# Patient Record
Sex: Female | Born: 1950 | Race: White | Hispanic: No | Marital: Married | Smoking: Never smoker
Health system: Southern US, Community
[De-identification: ages and names within clinical notes are randomized; demographics above are authoritative.]

## PROBLEM LIST (undated history)

## (undated) DIAGNOSIS — Z8719 Personal history of other diseases of the digestive system: Secondary | ICD-10-CM

## (undated) DIAGNOSIS — T7840XA Allergy, unspecified, initial encounter: Secondary | ICD-10-CM

## (undated) DIAGNOSIS — D649 Anemia, unspecified: Secondary | ICD-10-CM

## (undated) DIAGNOSIS — K5792 Diverticulitis of intestine, part unspecified, without perforation or abscess without bleeding: Secondary | ICD-10-CM

## (undated) DIAGNOSIS — L219 Seborrheic dermatitis, unspecified: Secondary | ICD-10-CM

## (undated) DIAGNOSIS — R112 Nausea with vomiting, unspecified: Secondary | ICD-10-CM

## (undated) DIAGNOSIS — H269 Unspecified cataract: Secondary | ICD-10-CM

## (undated) DIAGNOSIS — T8859XA Other complications of anesthesia, initial encounter: Secondary | ICD-10-CM

## (undated) DIAGNOSIS — K589 Irritable bowel syndrome without diarrhea: Secondary | ICD-10-CM

## (undated) DIAGNOSIS — Z9889 Other specified postprocedural states: Secondary | ICD-10-CM

## (undated) DIAGNOSIS — B029 Zoster without complications: Secondary | ICD-10-CM

## (undated) DIAGNOSIS — I10 Essential (primary) hypertension: Secondary | ICD-10-CM

## (undated) DIAGNOSIS — J302 Other seasonal allergic rhinitis: Secondary | ICD-10-CM

## (undated) DIAGNOSIS — T4145XA Adverse effect of unspecified anesthetic, initial encounter: Secondary | ICD-10-CM

## (undated) DIAGNOSIS — F329 Major depressive disorder, single episode, unspecified: Secondary | ICD-10-CM

## (undated) DIAGNOSIS — E559 Vitamin D deficiency, unspecified: Secondary | ICD-10-CM

## (undated) DIAGNOSIS — K635 Polyp of colon: Secondary | ICD-10-CM

## (undated) DIAGNOSIS — J45909 Unspecified asthma, uncomplicated: Secondary | ICD-10-CM

## (undated) DIAGNOSIS — F419 Anxiety disorder, unspecified: Secondary | ICD-10-CM

## (undated) DIAGNOSIS — K219 Gastro-esophageal reflux disease without esophagitis: Secondary | ICD-10-CM

## (undated) DIAGNOSIS — A4902 Methicillin resistant Staphylococcus aureus infection, unspecified site: Secondary | ICD-10-CM

## (undated) DIAGNOSIS — G4700 Insomnia, unspecified: Secondary | ICD-10-CM

## (undated) DIAGNOSIS — M8580 Other specified disorders of bone density and structure, unspecified site: Secondary | ICD-10-CM

## (undated) DIAGNOSIS — K760 Fatty (change of) liver, not elsewhere classified: Secondary | ICD-10-CM

## (undated) DIAGNOSIS — E785 Hyperlipidemia, unspecified: Secondary | ICD-10-CM

## (undated) DIAGNOSIS — R7303 Prediabetes: Secondary | ICD-10-CM

## (undated) DIAGNOSIS — M1990 Unspecified osteoarthritis, unspecified site: Secondary | ICD-10-CM

## (undated) DIAGNOSIS — K5790 Diverticulosis of intestine, part unspecified, without perforation or abscess without bleeding: Secondary | ICD-10-CM

## (undated) DIAGNOSIS — M069 Rheumatoid arthritis, unspecified: Secondary | ICD-10-CM

## (undated) HISTORY — DX: Diverticulitis of intestine, part unspecified, without perforation or abscess without bleeding: K57.92

## (undated) HISTORY — DX: Major depressive disorder, single episode, unspecified: F32.9

## (undated) HISTORY — DX: Insomnia, unspecified: G47.00

## (undated) HISTORY — PX: CATARACT EXTRACTION, BILATERAL: SHX1313

## (undated) HISTORY — DX: Unspecified osteoarthritis, unspecified site: M19.90

## (undated) HISTORY — PX: TOTAL KNEE ARTHROPLASTY: SHX125

## (undated) HISTORY — DX: Other seasonal allergic rhinitis: J30.2

## (undated) HISTORY — DX: Rheumatoid arthritis, unspecified: M06.9

## (undated) HISTORY — DX: Irritable bowel syndrome, unspecified: K58.9

## (undated) HISTORY — DX: Unspecified asthma, uncomplicated: J45.909

## (undated) HISTORY — DX: Anemia, unspecified: D64.9

## (undated) HISTORY — PX: JOINT REPLACEMENT: SHX530

## (undated) HISTORY — PX: EYE SURGERY: SHX253

## (undated) HISTORY — DX: Unspecified cataract: H26.9

## (undated) HISTORY — DX: Diverticulosis of intestine, part unspecified, without perforation or abscess without bleeding: K57.90

## (undated) HISTORY — DX: Allergy, unspecified, initial encounter: T78.40XA

## (undated) HISTORY — DX: Other specified disorders of bone density and structure, unspecified site: M85.80

## (undated) HISTORY — DX: Hyperlipidemia, unspecified: E78.5

## (undated) HISTORY — PX: LUMBAR FUSION: SHX111

## (undated) HISTORY — DX: Polyp of colon: K63.5

## (undated) HISTORY — PX: SPINE SURGERY: SHX786

## (undated) HISTORY — DX: Vitamin D deficiency, unspecified: E55.9

## (undated) HISTORY — DX: Anxiety disorder, unspecified: F41.9

## (undated) HISTORY — DX: Essential (primary) hypertension: I10

## (undated) HISTORY — DX: Other specified postprocedural states: Z98.890

## (undated) HISTORY — PX: TONSILLECTOMY: SUR1361

## (undated) HISTORY — DX: Zoster without complications: B02.9

## (undated) HISTORY — DX: Personal history of other diseases of the digestive system: Z87.19

## (undated) HISTORY — DX: Gastro-esophageal reflux disease without esophagitis: K21.9

## (undated) HISTORY — DX: Seborrheic dermatitis, unspecified: L21.9

## (undated) HISTORY — PX: ADENOIDECTOMY: SUR15

## (undated) HISTORY — DX: Prediabetes: R73.03

## (undated) HISTORY — PX: CARDIAC CATHETERIZATION: SHX172

---

## 1973-11-19 HISTORY — PX: APPENDECTOMY: SHX54

## 1986-11-19 HISTORY — PX: TOTAL ABDOMINAL HYSTERECTOMY: SHX209

## 1988-11-19 HISTORY — PX: OTHER SURGICAL HISTORY: SHX169

## 2003-11-20 HISTORY — PX: ROTATOR CUFF REPAIR: SHX139

## 2006-11-19 HISTORY — PX: WRIST RECONSTRUCTION: SHX2675

## 2011-11-20 HISTORY — PX: MENISCUS REPAIR: SHX5179

## 2014-12-06 DIAGNOSIS — D509 Iron deficiency anemia, unspecified: Secondary | ICD-10-CM | POA: Insufficient documentation

## 2014-12-06 DIAGNOSIS — F419 Anxiety disorder, unspecified: Secondary | ICD-10-CM | POA: Insufficient documentation

## 2014-12-06 DIAGNOSIS — F330 Major depressive disorder, recurrent, mild: Secondary | ICD-10-CM | POA: Insufficient documentation

## 2014-12-06 DIAGNOSIS — E785 Hyperlipidemia, unspecified: Secondary | ICD-10-CM | POA: Insufficient documentation

## 2014-12-06 DIAGNOSIS — J45909 Unspecified asthma, uncomplicated: Secondary | ICD-10-CM | POA: Insufficient documentation

## 2015-09-14 DIAGNOSIS — Z8249 Family history of ischemic heart disease and other diseases of the circulatory system: Secondary | ICD-10-CM | POA: Insufficient documentation

## 2015-11-22 ENCOUNTER — Encounter: Payer: Self-pay | Admitting: Gastroenterology

## 2016-01-16 ENCOUNTER — Ambulatory Visit (INDEPENDENT_AMBULATORY_CARE_PROVIDER_SITE_OTHER): Payer: Managed Care, Other (non HMO) | Admitting: Gastroenterology

## 2016-01-16 ENCOUNTER — Encounter: Payer: Self-pay | Admitting: Gastroenterology

## 2016-01-16 VITALS — BP 158/72 | HR 80 | Ht 63.0 in | Wt 163.4 lb

## 2016-01-16 DIAGNOSIS — K5730 Diverticulosis of large intestine without perforation or abscess without bleeding: Secondary | ICD-10-CM

## 2016-01-16 DIAGNOSIS — K921 Melena: Secondary | ICD-10-CM | POA: Diagnosis not present

## 2016-01-16 MED ORDER — NA SULFATE-K SULFATE-MG SULF 17.5-3.13-1.6 GM/177ML PO SOLN: 1.0000 | Freq: Once | ORAL | Status: DC

## 2016-01-16 NOTE — Progress Notes (Signed)
HPI: This is a    very pleasant 65 year old woman   who was referred to me by Velna Hatchet to evaluate  Hemoccult-positive stools .    Chief complaint is Hemoccult positive stools, history of diverticulitis  Moved from [place], [place]. Prior [place].  Has had at least 3 attacks of diverticulitis.  The pain is usually LLQ, lasting 5-10 days, cipro/flagyl, has had CT proof in past.  Most recent attack 2015 while moving this way.  Put on antibiotics and her symptoms resolved. Usually gets 'low grade fevers.'  Between these episodes she tends to have constipation.  Her GI MD in [place] she really liked.  She starts stool softners as soon as the stool becomes very hard, difficult to push out. This occurs about once per month  Never sees blood in stool, but heme + stools by gynecology.    Last colonoscopy was in 2008 had small (non-precancerous polyps).  She takes benefiber daily, pills when she travels. Daily.  Review of systems: Pertinent positive and negative review of systems were noted in the above HPI section. Complete review of systems was performed and was otherwise normal.   Past Medical History  Diagnosis Date  . Anemia   . Anxiety   . Arthritis   . Osteoarthritis   . Asthma   . Diverticulosis   . Diverticulitis   . Status post dilation of esophageal narrowing   . GERD (gastroesophageal reflux disease)   . HTN (hypertension)   . HLD (hyperlipidemia)   . IBS (irritable bowel syndrome)   . Colon polyps     Past Surgical History  Procedure Laterality Date  . Appendectomy  1975  . Total abdominal hysterectomy  1988  . Tonsillectomy      as a child  . Bladder tack  1990  . Rotator cuff repair Right 2005  . Wrist reconstruction Right 2008  . Knee arthroscopy Left 2011  . Meniscus repair Right 2013    Current Outpatient Prescriptions  Medication Sig Dispense Refill  . acetaminophen (TYLENOL) 325 MG tablet Take 650 mg by mouth as needed.    Marland Kitchen amLODipine (NORVASC) 5 MG tablet Take  5 mg by mouth daily.    Marland Kitchen aspirin 81 MG tablet Take 81 mg by mouth daily.    . Cholecalciferol (VITAMIN D3) 10000 units TABS Take 1 tablet by mouth once a week.    . hydrochlorothiazide (HYDRODIURIL) 25 MG tablet Take 25 mg by mouth daily.    Marland Kitchen losartan (COZAAR) 100 MG tablet Take 100 mg by mouth daily.    Marland Kitchen MELATONIN PO Take 1 tablet by mouth as needed.    . Multiple Vitamin (MULTIVITAMIN) tablet Take 1 tablet by mouth daily.    . Omega-3 Fatty Acids (FISH OIL) 1000 MG CAPS Take 1 capsule by mouth 2 (two) times daily.    Marland Kitchen omeprazole (PRILOSEC OTC) 20 MG tablet Take 20 mg by mouth daily.    . Triprolidine-Pseudoephedrine (ANTIHISTAMINE PO) Take by mouth as needed.     No current facility-administered medications for this visit.    Allergies as of 01/16/2016 - Review Complete 01/16/2016  Allergen Reaction Noted  . Demerol [meperidine] Nausea Only 01/16/2016    Family History  Problem Relation Age of Onset  . Colon polyps Mother   . Diabetes Mother   . Heart disease Mother   . Kidney cancer Mother   . Colon polyps Son     Social History   Social History  . Marital Status: Married  Spouse Name: N/A  . Number of Children: 2  . Years of Education: N/A   Occupational History  . retired    Social History Main Topics  . Smoking status: Never Smoker   . Smokeless tobacco: Never Used  . Alcohol Use: 4.2 oz/week    7 Standard drinks or equivalent per week  . Drug Use: No  . Sexual Activity: Not on file   Other Topics Concern  . Not on file   Social History Narrative  . No narrative on file     Physical Exam: BP 158/72 mmHg  Pulse 80  Ht 5\' 3"  (1.6 m)  Wt 163 lb 6 oz (74.106 kg)  BMI 28.95 kg/m2 Constitutional: generally well-appearing Psychiatric: alert and oriented x3 Eyes: extraocular movements intact Mouth: oral pharynx moist, no lesions Neck: supple no lymphadenopathy Cardiovascular: heart regular rate and rhythm Lungs: clear to auscultation  bilaterally Abdomen: soft, nontender, nondistended, no obvious ascites, no peritoneal signs, normal bowel sounds Extremities: no lower extremity edema bilaterally Skin: no lesions on visible extremities   Assessment and plan: 65 y.o. female with  Hemoccult-positive stools, history of diverticulitis  She has a very good story for recurrent mild left-sided diverticulitis. Previously in [place] her physician told her that if she has several more taxi would likely refer her for surgical resection. I recommended that if she has diverticulitis-like symptoms again she should call this office. My plan would be to get a CAT scan as soon as possible and restart her on her usual antibiotics Cipro, Flagyl. She has a pretty good bowel regimen right now with daily fiber supplements. I recommended she try MiraLAX on an every day or every other day basis well to prevent her once monthly episodes of constipation. She had Hemoccult-positive stools on screening evaluation recently, her last colonoscopy was about 9 years ago elsewhere. I recommended we repeat her colonoscopy now. I also told her she can politely decline future stool screening examinations since she will be involved in a colonoscopy screening program   Owens Loffler, MD [facility] 01/16/2016, 8:51 AM  Cc: Velna Hatchet

## 2016-01-16 NOTE — Patient Instructions (Addendum)
We will get records sent from your previous gastroenterologist  (in [place]) for review.  This will include any endoscopic (colonoscopy or upper endoscopy) procedures and any associated pathology reports.   You will be set up for a colonoscopy (for FOBT + stool). If you have diverticulitis like symptoms again, please call this office.

## 2016-02-13 ENCOUNTER — Ambulatory Visit (AMBULATORY_SURGERY_CENTER): Payer: Managed Care, Other (non HMO) | Admitting: Gastroenterology

## 2016-02-13 ENCOUNTER — Encounter: Payer: Self-pay | Admitting: Gastroenterology

## 2016-02-13 VITALS — BP 101/53 | HR 54 | Temp 97.5°F | Resp 12 | Ht 63.0 in | Wt 163.0 lb

## 2016-02-13 DIAGNOSIS — K921 Melena: Secondary | ICD-10-CM

## 2016-02-13 MED ORDER — SODIUM CHLORIDE 0.9 % IV SOLN: 500.0000 mL | INTRAVENOUS | Status: DC

## 2016-02-13 NOTE — Op Note (Signed)
[facility] Patient Name: Courtney Crane Procedure Date: 02/13/2016 1:57 PM MRN: [id number] Endoscopist: Milus Banister , MD Age: 65 Referring MD:  Date of Birth: 10-04-1951 Gender: Female Procedure:                Colonoscopy Indications:              Heme positive stool Medicines:                Monitored Anesthesia Care Procedure:                Pre-Anesthesia Assessment:                           - Prior to the procedure, a History and Physical                            was performed, and patient medications and                            allergies were reviewed. The patient's tolerance of                            previous anesthesia was also reviewed. The risks                            and benefits of the procedure and the sedation                            options and risks were discussed with the patient.                            All questions were answered, and informed consent                            was obtained. Prior Anticoagulants: The patient has                            taken no previous anticoagulant or antiplatelet                            agents. ASA Grade Assessment: II - A patient with                            mild systemic disease. After reviewing the risks                            and benefits, the patient was deemed in                            satisfactory condition to undergo the procedure.                           After obtaining informed consent, the colonoscope  was passed under direct vision. Throughout the                            procedure, the patient's blood pressure, pulse, and                            oxygen saturations were monitored continuously. The                            Model CF-HQ190L [id number] scope was introduced                            through the anus and advanced to the the cecum,                            identified by appendiceal orifice and ileocecal                   valve. The colonoscopy was performed without                            difficulty. The patient tolerated the procedure                            well. The quality of the bowel preparation was                            excellent. The ileocecal valve, appendiceal                            orifice, and rectum were photographed. Scope In: 2:07:21 PM Scope Out: 2:14:21 PM Scope Withdrawal Time: 0 hours 4 minutes 52 seconds  Total Procedure Duration: 0 hours 7 minutes 0 seconds  Findings:      Multiple small and large-mouthed diverticula were found in the entire       colon.      The exam was otherwise without abnormality on direct and retroflexion       views.      No polyps or cancers. Complications:            No immediate complications. Estimated Blood Loss:     Estimated blood loss: none. Impression:               - Diverticulosis in the entire examined colon.                           - The examination was otherwise normal on direct                            and retroflexion views.                           - No specimens collected. Recommendation:           - Patient has a contact number available for  emergencies. The signs and symptoms of potential                            delayed complications were discussed with the                            patient. Return to normal activities tomorrow.                            Written discharge instructions were provided to the                            patient.                           - Resume previous diet.                           - Continue present medications.                           - Repeat colonoscopy in 10 years for screening                            purposes. Procedure Code(s):        --- Professional ---                           [phone], Colonoscopy, flexible; diagnostic, including                            collection of specimen(s) by brushing or washing,                             when performed (separate procedure) CPT copyright 2016 American Medical Association. All rights reserved. Milus Banister, MD 02/13/2016 2:16:55 PM This report has been signed electronically. Number of Addenda: 0 Referring MD:      Velna Hatchet

## 2016-02-13 NOTE — Progress Notes (Signed)
To PACU PT awake and alert. Report to RN 

## 2016-02-13 NOTE — Patient Instructions (Signed)
YOU HAD AN ENDOSCOPIC PROCEDURE TODAY AT [facility]:   Refer to the procedure report that was given to you for any specific questions about what was found during the examination.  If the procedure report does not answer your questions, please call your gastroenterologist to clarify.  If you requested that your care partner not be given the details of your procedure findings, then the procedure report has been included in a sealed envelope for you to review at your convenience later.  YOU SHOULD EXPECT: Some feelings of bloating in the abdomen. Passage of more gas than usual.  Walking can help get rid of the air that was put into your GI tract during the procedure and reduce the bloating. If you had a lower endoscopy (such as a colonoscopy or flexible sigmoidoscopy) you may notice spotting of blood in your stool or on the toilet paper. If you underwent a bowel prep for your procedure, you may not have a normal bowel movement for a few days.  Please Note:  You might notice some irritation and congestion in your nose or some drainage.  This is from the oxygen used during your procedure.  There is no need for concern and it should clear up in a day or so.  SYMPTOMS TO REPORT IMMEDIATELY:   Following lower endoscopy (colonoscopy or flexible sigmoidoscopy):  Excessive amounts of blood in the stool  Significant tenderness or worsening of abdominal pains  Swelling of the abdomen that is new, acute  Fever of 100F or higher   For urgent or emergent issues, a gastroenterologist can be reached at any hour by calling [phone].   DIET: Your first meal following the procedure should be a small meal and then it is ok to progress to your normal diet. Heavy or fried foods are harder to digest and may make you feel nauseous or bloated.  Likewise, meals heavy in dairy and vegetables can increase bloating.  Drink plenty of fluids but you should avoid alcoholic beverages for 24  hours.  ACTIVITY:  You should plan to take it easy for the rest of today and you should NOT DRIVE or use heavy machinery until tomorrow (because of the sedation medicines used during the test).    FOLLOW UP: Our staff will call the number listed on your records the next business day following your procedure to check on you and address any questions or concerns that you may have regarding the information given to you following your procedure. If we do not reach you, we will leave a message.  However, if you are feeling well and you are not experiencing any problems, there is no need to return our call.  We will assume that you have returned to your regular daily activities without incident.  If any biopsies were taken you will be contacted by phone or by letter within the next 1-3 weeks.  Please call us at [phone] if you have not heard about the biopsies in 3 weeks.    SIGNATURES/CONFIDENTIALITY: You and/or your care partner have signed paperwork which will be entered into your electronic medical record.  These signatures attest to the fact that that the information above on your After Visit Summary has been reviewed and is understood.  Full responsibility of the confidentiality of this discharge information lies with you and/or your care-partner.  Diverticulosis, high fiber diet-handouts given  Repeat colonoscopy in 10 years 2027.

## 2016-02-14 ENCOUNTER — Telehealth: Payer: Self-pay

## 2016-02-14 NOTE — Telephone Encounter (Signed)
Attempt post procedure follow up call. No answer, left voice mail message.

## 2016-04-30 DIAGNOSIS — R21 Rash and other nonspecific skin eruption: Secondary | ICD-10-CM | POA: Diagnosis not present

## 2016-04-30 DIAGNOSIS — Z6828 Body mass index (BMI) 28.0-28.9, adult: Secondary | ICD-10-CM | POA: Diagnosis not present

## 2016-06-05 DIAGNOSIS — D509 Iron deficiency anemia, unspecified: Secondary | ICD-10-CM

## 2016-06-05 DIAGNOSIS — L219 Seborrheic dermatitis, unspecified: Secondary | ICD-10-CM

## 2016-06-05 DIAGNOSIS — D649 Anemia, unspecified: Secondary | ICD-10-CM | POA: Insufficient documentation

## 2016-06-05 DIAGNOSIS — G4700 Insomnia, unspecified: Secondary | ICD-10-CM | POA: Insufficient documentation

## 2016-06-05 DIAGNOSIS — E559 Vitamin D deficiency, unspecified: Secondary | ICD-10-CM | POA: Insufficient documentation

## 2016-06-05 DIAGNOSIS — R7303 Prediabetes: Secondary | ICD-10-CM | POA: Insufficient documentation

## 2016-06-05 DIAGNOSIS — I10 Essential (primary) hypertension: Secondary | ICD-10-CM | POA: Insufficient documentation

## 2016-06-05 DIAGNOSIS — K219 Gastro-esophageal reflux disease without esophagitis: Secondary | ICD-10-CM | POA: Insufficient documentation

## 2016-06-06 ENCOUNTER — Ambulatory Visit (INDEPENDENT_AMBULATORY_CARE_PROVIDER_SITE_OTHER): Payer: Medicare Other | Admitting: Internal Medicine

## 2016-06-06 ENCOUNTER — Encounter: Payer: Self-pay | Admitting: Internal Medicine

## 2016-06-06 VITALS — BP 152/78 | HR 72 | Temp 97.6°F | Wt 160.0 lb

## 2016-06-06 DIAGNOSIS — Z113 Encounter for screening for infections with a predominantly sexual mode of transmission: Secondary | ICD-10-CM

## 2016-06-06 DIAGNOSIS — A64 Unspecified sexually transmitted disease: Secondary | ICD-10-CM

## 2016-06-06 DIAGNOSIS — A6000 Herpesviral infection of urogenital system, unspecified: Secondary | ICD-10-CM

## 2016-06-06 DIAGNOSIS — Z8619 Personal history of other infectious and parasitic diseases: Secondary | ICD-10-CM

## 2016-06-06 DIAGNOSIS — J302 Other seasonal allergic rhinitis: Secondary | ICD-10-CM | POA: Diagnosis not present

## 2016-06-06 HISTORY — DX: Herpesviral infection of urogenital system, unspecified: A60.00

## 2016-06-06 HISTORY — DX: Personal history of other infectious and parasitic diseases: Z86.19

## 2016-06-07 NOTE — Progress Notes (Signed)
[facility]  Reason for Consult: Possible recurrent shingles Referring Physician: dr. Velna Hatchet  Patient Active Problem List   Diagnosis Date Noted  . History of shingles 06/06/2016    Priority: High  . Genital herpes simplex 06/06/2016    Priority: High  . Seasonal allergies 06/06/2016  . Seborrheic dermatitis   . Prediabetes   . Anemia   . GERD (gastroesophageal reflux disease)   . Vitamin D deficiency   . Insomnia, unspecified   . HTN (hypertension)     Patient's Medications  New Prescriptions   No medications on file  Previous Medications   ACETAMINOPHEN (TYLENOL) 325 MG TABLET    Take 650 mg by mouth as needed.   AMLODIPINE (NORVASC) 5 MG TABLET    Take 10 mg by mouth daily.    ASPIRIN 81 MG TABLET    Take 81 mg by mouth daily.   ATORVASTATIN (LIPITOR) 10 MG TABLET    Take 10 mg by mouth daily.   CHOLECALCIFEROL (VITAMIN D3) 10000 UNITS TABS    Take 1 tablet by mouth once a week.   HYDROCHLOROTHIAZIDE (HYDRODIURIL) 25 MG TABLET    Take 25 mg by mouth daily.   LOSARTAN (COZAAR) 100 MG TABLET    Take 100 mg by mouth daily.   MELATONIN PO    Take 1 tablet by mouth as needed.   MULTIPLE VITAMIN (MULTIVITAMIN) TABLET    Take 1 tablet by mouth daily.   OMEGA-3 FATTY ACIDS (FISH OIL) 1000 MG CAPS    Take 1 capsule by mouth 2 (two) times daily.   OMEPRAZOLE (PRILOSEC OTC) 20 MG TABLET    Take 20 mg by mouth daily.   TRIPROLIDINE-PSEUDOEPHEDRINE (ANTIHISTAMINE PO)    Take by mouth as needed.  Modified Medications   No medications on file  Discontinued Medications   No medications on file    Recommendations: 1. I asked her to contact me if and when her rash recurs so I can obtain cultures for herpes  Assessment: I am not absolutely certain that her initial rash in 2014 shingles. The fact that it did not wrap around her trunk in a dermatomal distribution and the fact that the rash lasted 6-8 weeks he certainly atypical of  shingles. The more recent rashes that have recurred on her left buttock sounds much more like genital herpes. I have asked her to contact me if the rash recurs so I can see the rash and obtain herpes cultures. It is herpes then I would recommend self-directed therapy with valacyclovir to see if we can abort the rash and lessen the physical pain and psychological impact.  HPI: Courtney Crane is a 65 y.o. female who had chickenpox as a young girl.She received Zostavax in 2010.  In 2014 she developed a diffuse rash on the left side of her back from her bra strap to the top of her buttock. Her husband told her that it looked like shingles. She saw her primary care physician in [place] who confirmed that it looked like shingles She was treated with valacyclovir. She states the rash lasted from 6-8 weeks. She cannot recall if valacyclovir made the rash better. The rash went away for about one month and then recurred. It lasted for about 6 weeks again. It was painful and itched.   Since that time she has had about 8 "recurrences". These rashes has had a similar appearance but have occurred in a more confined  area lower on her left buttock.She had not sought any evaluation or treatment for these until she had her last episode in May.She states that she is becoming increasingly frustrated and concerned. When she was evaluated in June by Dr. Ardeth Perfect described "2 patches of light erythema medial upper left gluteal area with varying stages of drying". This note indicates that he culture done of the lesions but I do not have those results. He did obtain serology and her HSV-2 serology and varicella-zoster serology were both positive.  She recalls being told that she had genital herpes shortly after she was married to her current husband for 40 years ago.She cannot recall the location of the rash that she had at that time. She believes that she and her husband are in a mutually monogamous relationship. She is a frequent  blood donor and has never been told that she had HIV infection or other sexually transmitted diseases.  Review of Systems: Review of Systems  Constitutional: Negative for fever, chills, weight loss, malaise/fatigue and diaphoresis.  HENT: Negative for sore throat.   Respiratory: Negative for cough, sputum production and shortness of breath.   Cardiovascular: Negative for chest pain.  Gastrointestinal: Positive for heartburn. Negative for nausea, vomiting, abdominal pain and diarrhea.  Genitourinary: Negative for dysuria and frequency.  Musculoskeletal: Negative for myalgias and joint pain.  Skin: Negative for rash.  Neurological: Negative for headaches.  Psychiatric/Behavioral: Negative for depression and substance abuse. The patient is not nervous/anxious.       Past Medical History  Diagnosis Date  . Anemia   . Anxiety   . Arthritis   . Osteoarthritis   . Asthma   . Diverticulosis   . Diverticulitis   . Status post dilation of esophageal narrowing   . GERD (gastroesophageal reflux disease)   . HTN (hypertension)   . HLD (hyperlipidemia)   . IBS (irritable bowel syndrome)   . Colon polyps   . Depression   . Insomnia   . Vitamin D deficiency   . Seasonal allergies   . Shingles   . IBS (irritable bowel syndrome)     Social History  Substance Use Topics  . Smoking status: Never Smoker   . Smokeless tobacco: Never Used  . Alcohol Use: 4.2 oz/week    7 Standard drinks or equivalent per week    Family History  Problem Relation Age of Onset  . Colon polyps Mother   . Diabetes Mother   . Heart disease Mother   . Kidney cancer Mother   . Arthritis Mother   . Hypertension Mother   . Hyperlipidemia Mother   . Colon polyps Son   . CAD Brother    Allergies  Allergen Reactions  . Demerol [Meperidine] Nausea Only  . Tape     OBJECTIVE: Filed Vitals:   06/06/16 1404  BP: 152/78  Pulse: 72  Temp: 97.6 F (36.4 C)  TempSrc: Oral  Weight: 160 lb (72.576 kg)    Body mass index is 28.35 kg/(m^2).   Physical Exam  Constitutional: She is oriented to person, place, and time.  She is alert and in no distress.  HENT:  Mouth/Throat: No oropharyngeal exudate.  Eyes: Conjunctivae are normal.  Cardiovascular: Normal rate and regular rhythm.   No murmur heard. Pulmonary/Chest: Breath sounds normal.  Abdominal: Soft. She exhibits no mass. There is no tenderness.  Musculoskeletal: Normal range of motion. She exhibits no edema or tenderness.  She has a healed left knee incision from previous total knee  arthroplasty  Neurological: She is alert and oriented to person, place, and time.  Skin: No rash noted.  She has no lesions on her back or buttocks at this time.  Psychiatric: Mood and affect normal.    Microbiology: No results found for this or any previous visit (from the past 240 hour(s)).  Michel Bickers, MD [facility] [phone] pager   [phone] cell 06/07/2016, 11:19 AM

## 2016-07-06 DIAGNOSIS — J45909 Unspecified asthma, uncomplicated: Secondary | ICD-10-CM | POA: Diagnosis not present

## 2016-07-06 DIAGNOSIS — J302 Other seasonal allergic rhinitis: Secondary | ICD-10-CM | POA: Diagnosis not present

## 2016-07-06 DIAGNOSIS — Z6828 Body mass index (BMI) 28.0-28.9, adult: Secondary | ICD-10-CM | POA: Diagnosis not present

## 2016-07-17 ENCOUNTER — Telehealth: Payer: Self-pay | Admitting: *Deleted

## 2016-07-17 DIAGNOSIS — A6000 Herpesviral infection of urogenital system, unspecified: Secondary | ICD-10-CM

## 2016-07-17 NOTE — Telephone Encounter (Signed)
Obtain Herpes Culture Sample with next Herpes outbreak per verbal order Dr. Michel Bickers.  Make a Nurse Visit appointment to obtain the sample.  Order has been placed in EPIC per verbal order Dr. Michel Bickers.

## 2016-07-20 DIAGNOSIS — J302 Other seasonal allergic rhinitis: Secondary | ICD-10-CM | POA: Diagnosis not present

## 2016-07-20 DIAGNOSIS — Z6828 Body mass index (BMI) 28.0-28.9, adult: Secondary | ICD-10-CM | POA: Diagnosis not present

## 2016-07-20 DIAGNOSIS — J45909 Unspecified asthma, uncomplicated: Secondary | ICD-10-CM | POA: Diagnosis not present

## 2016-07-20 DIAGNOSIS — I10 Essential (primary) hypertension: Secondary | ICD-10-CM | POA: Diagnosis not present

## 2016-09-25 ENCOUNTER — Encounter: Payer: Self-pay | Admitting: Internal Medicine

## 2016-09-26 ENCOUNTER — Ambulatory Visit (INDEPENDENT_AMBULATORY_CARE_PROVIDER_SITE_OTHER): Payer: Medicare Other | Admitting: *Deleted

## 2016-09-26 DIAGNOSIS — B009 Herpesviral infection, unspecified: Secondary | ICD-10-CM | POA: Diagnosis not present

## 2016-09-28 ENCOUNTER — Telehealth: Payer: Self-pay | Admitting: *Deleted

## 2016-09-28 ENCOUNTER — Other Ambulatory Visit: Payer: Self-pay | Admitting: *Deleted

## 2016-09-28 DIAGNOSIS — A6000 Herpesviral infection of urogenital system, unspecified: Secondary | ICD-10-CM

## 2016-09-28 LAB — HERPES SIMPLEX VIRUS CULTURE: ORGANISM ID, BACTERIA: DETECTED

## 2016-09-28 MED ORDER — VALACYCLOVIR HCL 500 MG PO TABS: ORAL_TABLET | ORAL | 11 refills | Status: DC

## 2016-09-28 NOTE — Telephone Encounter (Signed)
-----   Message from Michel Bickers, MD sent at 09/28/2016  4:08 PM EST ----- Please let Ms. Rakestraw know that her recent culture confirmed genital Herpes. Please send in a script for valacyclovir 500 mg bid for 5 days with 11 refills and instruct her to take as soon as she notes an outbreak.

## 2016-09-28 NOTE — Telephone Encounter (Signed)
Notified patient, sent prescription to pharmacy of patient's choice. Landis Gandy, RN

## 2016-10-01 DIAGNOSIS — L820 Inflamed seborrheic keratosis: Secondary | ICD-10-CM | POA: Diagnosis not present

## 2016-10-01 DIAGNOSIS — L821 Other seborrheic keratosis: Secondary | ICD-10-CM | POA: Diagnosis not present

## 2016-10-01 DIAGNOSIS — L812 Freckles: Secondary | ICD-10-CM | POA: Diagnosis not present

## 2016-10-01 DIAGNOSIS — D485 Neoplasm of uncertain behavior of skin: Secondary | ICD-10-CM | POA: Diagnosis not present

## 2016-10-12 DIAGNOSIS — J018 Other acute sinusitis: Secondary | ICD-10-CM | POA: Diagnosis not present

## 2016-10-12 DIAGNOSIS — J029 Acute pharyngitis, unspecified: Secondary | ICD-10-CM | POA: Diagnosis not present

## 2016-11-04 DIAGNOSIS — Z23 Encounter for immunization: Secondary | ICD-10-CM | POA: Diagnosis not present

## 2016-11-08 DIAGNOSIS — J111 Influenza due to unidentified influenza virus with other respiratory manifestations: Secondary | ICD-10-CM | POA: Diagnosis not present

## 2016-11-08 DIAGNOSIS — R05 Cough: Secondary | ICD-10-CM | POA: Diagnosis not present

## 2016-12-07 DIAGNOSIS — R5383 Other fatigue: Secondary | ICD-10-CM | POA: Diagnosis not present

## 2016-12-07 DIAGNOSIS — K219 Gastro-esophageal reflux disease without esophagitis: Secondary | ICD-10-CM | POA: Diagnosis not present

## 2016-12-07 DIAGNOSIS — Z6827 Body mass index (BMI) 27.0-27.9, adult: Secondary | ICD-10-CM | POA: Diagnosis not present

## 2016-12-07 DIAGNOSIS — I10 Essential (primary) hypertension: Secondary | ICD-10-CM | POA: Diagnosis not present

## 2016-12-11 ENCOUNTER — Other Ambulatory Visit: Payer: Self-pay | Admitting: Internal Medicine

## 2016-12-11 ENCOUNTER — Encounter: Payer: Self-pay | Admitting: Gastroenterology

## 2016-12-11 DIAGNOSIS — K2100 Gastro-esophageal reflux disease with esophagitis, without bleeding: Secondary | ICD-10-CM

## 2016-12-14 ENCOUNTER — Ambulatory Visit
Admission: RE | Admit: 2016-12-14 | Discharge: 2016-12-14 | Disposition: A | Discharge status: Home / Self Care | Payer: Medicare HMO | Source: Ambulatory Visit | Attending: Internal Medicine | Admitting: Internal Medicine

## 2016-12-14 DIAGNOSIS — K219 Gastro-esophageal reflux disease without esophagitis: Secondary | ICD-10-CM | POA: Diagnosis not present

## 2016-12-14 DIAGNOSIS — K2100 Gastro-esophageal reflux disease with esophagitis, without bleeding: Secondary | ICD-10-CM

## 2016-12-27 DIAGNOSIS — Z6828 Body mass index (BMI) 28.0-28.9, adult: Secondary | ICD-10-CM | POA: Diagnosis not present

## 2016-12-27 DIAGNOSIS — M8588 Other specified disorders of bone density and structure, other site: Secondary | ICD-10-CM | POA: Diagnosis not present

## 2016-12-27 DIAGNOSIS — Z1231 Encounter for screening mammogram for malignant neoplasm of breast: Secondary | ICD-10-CM | POA: Diagnosis not present

## 2016-12-27 DIAGNOSIS — Z01419 Encounter for gynecological examination (general) (routine) without abnormal findings: Secondary | ICD-10-CM | POA: Diagnosis not present

## 2016-12-27 DIAGNOSIS — N958 Other specified menopausal and perimenopausal disorders: Secondary | ICD-10-CM | POA: Diagnosis not present

## 2016-12-28 ENCOUNTER — Encounter: Payer: Self-pay | Admitting: Gastroenterology

## 2017-01-01 ENCOUNTER — Other Ambulatory Visit (INDEPENDENT_AMBULATORY_CARE_PROVIDER_SITE_OTHER): Payer: Medicare HMO

## 2017-01-01 ENCOUNTER — Ambulatory Visit (HOSPITAL_COMMUNITY)
Admission: RE | Admit: 2017-01-01 | Discharge: 2017-01-01 | Disposition: A | Discharge status: Home / Self Care | Payer: Medicare HMO | Source: Ambulatory Visit | Attending: Gastroenterology | Admitting: Gastroenterology

## 2017-01-01 ENCOUNTER — Ambulatory Visit (INDEPENDENT_AMBULATORY_CARE_PROVIDER_SITE_OTHER): Payer: Medicare HMO | Admitting: Gastroenterology

## 2017-01-01 ENCOUNTER — Encounter: Payer: Self-pay | Admitting: Gastroenterology

## 2017-01-01 VITALS — BP 118/58 | HR 80 | Ht 63.0 in | Wt 157.1 lb

## 2017-01-01 DIAGNOSIS — R1013 Epigastric pain: Secondary | ICD-10-CM | POA: Insufficient documentation

## 2017-01-01 DIAGNOSIS — G8929 Other chronic pain: Secondary | ICD-10-CM

## 2017-01-01 DIAGNOSIS — K8020 Calculus of gallbladder without cholecystitis without obstruction: Secondary | ICD-10-CM | POA: Diagnosis not present

## 2017-01-01 LAB — CBC WITH DIFFERENTIAL/PLATELET
BASOS ABS: 0.1 10*3/uL (ref 0.0–0.1)
Basophils Relative: 1.5 % (ref 0.0–3.0)
EOS ABS: 0.3 10*3/uL (ref 0.0–0.7)
Eosinophils Relative: 4.2 % (ref 0.0–5.0)
HEMATOCRIT: 34.4 % — AB (ref 36.0–46.0)
HEMOGLOBIN: 11.1 g/dL — AB (ref 12.0–15.0)
LYMPHS PCT: 40.4 % (ref 12.0–46.0)
Lymphs Abs: 2.8 10*3/uL (ref 0.7–4.0)
MCHC: 32.4 g/dL (ref 30.0–36.0)
MCV: 86.4 fl (ref 78.0–100.0)
Monocytes Absolute: 0.8 10*3/uL (ref 0.1–1.0)
Monocytes Relative: 11.5 % (ref 3.0–12.0)
NEUTROS ABS: 2.9 10*3/uL (ref 1.4–7.7)
Neutrophils Relative %: 42.4 % — ABNORMAL LOW (ref 43.0–77.0)
PLATELETS: 212 10*3/uL (ref 150.0–400.0)
RBC: 3.98 Mil/uL (ref 3.87–5.11)
RDW: 15.6 % — ABNORMAL HIGH (ref 11.5–15.5)
WBC: 6.9 10*3/uL (ref 4.0–10.5)

## 2017-01-01 LAB — COMPREHENSIVE METABOLIC PANEL
ALBUMIN: 4.5 g/dL (ref 3.5–5.2)
ALK PHOS: 58 U/L (ref 39–117)
ALT: 39 U/L — AB (ref 0–35)
AST: 26 U/L (ref 0–37)
BILIRUBIN TOTAL: 0.4 mg/dL (ref 0.2–1.2)
BUN: 18 mg/dL (ref 6–23)
CALCIUM: 9.9 mg/dL (ref 8.4–10.5)
CO2: 28 mEq/L (ref 19–32)
Chloride: 102 mEq/L (ref 96–112)
Creatinine, Ser: 0.9 mg/dL (ref 0.40–1.20)
GFR: 66.63 mL/min (ref >60.00)
Glucose, Bld: 105 mg/dL — ABNORMAL HIGH (ref 70–99)
Potassium: 4.4 mEq/L (ref 3.5–5.1)
Sodium: 138 mEq/L (ref 135–145)
TOTAL PROTEIN: 7.9 g/dL (ref 6.0–8.3)

## 2017-01-01 NOTE — Patient Instructions (Addendum)
You will be set up for an ultrasound for epigastric pain. You have been scheduled for an abdominal ultrasound at [facility] (1st floor of hospital) on TODAY at 2 pm. Please arrive 15 minutes prior to your appointment for registration. Make certain not to have anything to eat or drink 6 hours prior to your appointment. Should you need to reschedule your appointment, please contact radiology at [phone]. This test typically takes about 30 minutes to perform.  You will have labs checked today in the basement lab.  Please head down after you check out with the front desk  (cbc, cmet).

## 2017-01-01 NOTE — Progress Notes (Signed)
Review of pertinent gastrointestinal problems: 1. Recurrent acute diverticulitis: established care Dr. Ardis Hughs 2017; previously at least 3 attacks of diverticulitis.  The pain is usually LLQ, lasting 5-10 days, cipro/flagyl, has had CT proof in past.  Most recent attack 2015 while moving to [place].  Put on antibiotics and her symptoms resolved. Usually gets 'low grade fevers.' 2. Routine risk for colon cancer: Colonoscopy 01/2016 Dr. Ardis Hughs: pan-diverticulosis, no polyps. Recall at 10 years recommended.   HPI: This is a  very pleasant 66 year old woman who was referred to me by Velna Hatchet, MD  to evaluate   epigastric pain, burning  Chief complaint is epigastric pain, burning  I last saw her about a year ago the time of a colonoscopy, discussion of previous diverticulitis attacks. She is here today for a new problem.  Has always had GERD issues.  Has had dilations with EGD at least twice.  About a month ago, pyrosis returned.  She took extra PPI.  But the symptoms were worse than usual; terrible pain in epigastrium for 2-3 minutes abd then lingered for 2-3 hours.  + mild nausea, no radiation.  Her OTC omep was changed for dexilant.  Burning is gone but pain remains.  The pain is sometimes worse when she eats but not always.  Overall stable weight.  She takes ibuprofen only pretty rarely.  No fevers or chills.  Old Data Reviewed:  11/2016 UGI ordered by PCP for "GERD, esophagitis, h/o esophageal dilation" was normal.   Review of systems: Pertinent positive and negative review of systems were noted in the above HPI section. Complete review of systems was performed and was otherwise normal.   Past Medical History:  Diagnosis Date  . Anemia   . Anxiety   . Arthritis   . Asthma   . Colon polyps   . Depression   . Diverticulitis   . Diverticulosis   . GERD (gastroesophageal reflux disease)   . HLD (hyperlipidemia)   . HTN (hypertension)   . IBS (irritable bowel syndrome)   . IBS  (irritable bowel syndrome)   . Insomnia   . Osteoarthritis   . Seasonal allergies   . Shingles   . Status post dilation of esophageal narrowing   . Vitamin D deficiency     Past Surgical History:  Procedure Laterality Date  . APPENDECTOMY  1975  . bladder tack  1990  . KNEE ARTHROSCOPY Left 2011  . MENISCUS REPAIR Right 2013  . ROTATOR CUFF REPAIR Right 2005  . TONSILLECTOMY     as a child  . TOTAL ABDOMINAL HYSTERECTOMY  1988  . WRIST RECONSTRUCTION Right 2008    Current Outpatient Prescriptions  Medication Sig Dispense Refill  . acetaminophen (TYLENOL) 325 MG tablet Take 650 mg by mouth as needed.    Marland Kitchen amLODipine (NORVASC) 5 MG tablet Take 10 mg by mouth daily.     Marland Kitchen aspirin 81 MG tablet Take 81 mg by mouth daily.    . Cholecalciferol (VITAMIN D3) 10000 units TABS Take 1 tablet by mouth once a week.    Marland Kitchen dexlansoprazole (DEXILANT) 60 MG capsule Take 60 mg by mouth daily.    . hydrochlorothiazide (HYDRODIURIL) 25 MG tablet Take 25 mg by mouth daily.    Marland Kitchen losartan (COZAAR) 100 MG tablet Take 100 mg by mouth daily.    Marland Kitchen MELATONIN PO Take 1 tablet by mouth as needed.    . Multiple Vitamin (MULTIVITAMIN) tablet Take 1 tablet by mouth daily.    Marland Kitchen  Omega-3 Fatty Acids (FISH OIL) 1000 MG CAPS Take 1 capsule by mouth 2 (two) times daily.    Marland Kitchen omeprazole (PRILOSEC OTC) 20 MG tablet Take 20 mg by mouth daily.    . ranitidine (ZANTAC) 150 MG tablet Take 150 mg by mouth daily.    . rosuvastatin (CRESTOR) 5 MG tablet Take 5 mg by mouth 3 (three) times a week.    . Triprolidine-Pseudoephedrine (ANTIHISTAMINE PO) Take by mouth as needed.    . valACYclovir (VALTREX) 500 MG tablet Take 1 tablet twice a day for 5 days at the start of an outbreak. 10 tablet 11   No current facility-administered medications for this visit.     Allergies as of 01/01/2017 - Review Complete 01/01/2017  Allergen Reaction Noted  . Demerol [meperidine] Nausea Only 01/16/2016  . Tape      Family History   Problem Relation Age of Onset  . Colon polyps Mother   . Diabetes Mother   . Heart disease Mother   . Kidney cancer Mother   . Arthritis Mother   . Hypertension Mother   . Hyperlipidemia Mother   . Colon polyps Son   . CAD Brother     Social History   Social History  . Marital status: Married    Spouse name: N/A  . Number of children: 2  . Years of education: N/A   Occupational History  . retired    Social History Main Topics  . Smoking status: Never Smoker  . Smokeless tobacco: Never Used  . Alcohol use 4.2 oz/week    7 Standard drinks or equivalent per week  . Drug use: No  . Sexual activity: Not on file   Other Topics Concern  . Not on file   Social History Narrative  . No narrative on file     Physical Exam: BP (!) 118/58 (BP Location: Left Arm, Patient Position: Sitting, Cuff Size: Normal)   Pulse 80   Ht 5\' 3"  (1.6 m)   Wt 157 lb 2 oz (71.3 kg)   BMI 27.83 kg/m  Constitutional: generally well-appearing Psychiatric: alert and oriented x3 Eyes: extraocular movements intact Mouth: oral pharynx moist, no lesions Neck: supple no lymphadenopathy Cardiovascular: heart regular rate and rhythm Lungs: clear to auscultation bilaterally Abdomen: soft, Mild to moderately tender midepigastrium nondistended, no obvious ascites, no peritoneal signs, normal bowel sounds Extremities: no lower extremity edema bilaterally Skin: no lesions on visible extremities   Assessment and plan: 66 y.o. female with  epigastric pain, tenderness  This is new problem. It started with 3 or 4 discrete attacks of pain. She had a lot of burning around most times as well. Increase proton pump inhibitor has helped with the burning but she still has lingering pain and she is mild to moderately tender in her mid epigastrium now. Concerned about possible gallstone disease with biliary colic or chronic cholecystitis. Can start the workup with an abdominal ultrasound and blood work including a  CBC and complete about profile. If this is not revealing that I'll recommend CAT scan of her abdomen and pelvis and then if that is not revealing the next step would be an upper endoscopy.   Please see the "Patient Instructions" section for addition details about the plan.   Owens Loffler, MD [facility] 01/01/2017, 11:26 AM  Cc: Velna Hatchet, MD

## 2017-01-02 ENCOUNTER — Other Ambulatory Visit: Payer: Self-pay

## 2017-01-02 DIAGNOSIS — K8020 Calculus of gallbladder without cholecystitis without obstruction: Secondary | ICD-10-CM

## 2017-01-04 DIAGNOSIS — M859 Disorder of bone density and structure, unspecified: Secondary | ICD-10-CM | POA: Diagnosis not present

## 2017-01-04 DIAGNOSIS — I10 Essential (primary) hypertension: Secondary | ICD-10-CM | POA: Diagnosis not present

## 2017-01-23 ENCOUNTER — Ambulatory Visit: Payer: Self-pay | Admitting: Surgery

## 2017-01-23 DIAGNOSIS — K8010 Calculus of gallbladder with chronic cholecystitis without obstruction: Secondary | ICD-10-CM | POA: Diagnosis not present

## 2017-01-29 DIAGNOSIS — E784 Other hyperlipidemia: Secondary | ICD-10-CM | POA: Diagnosis not present

## 2017-01-29 DIAGNOSIS — R7309 Other abnormal glucose: Secondary | ICD-10-CM | POA: Diagnosis not present

## 2017-01-29 DIAGNOSIS — E559 Vitamin D deficiency, unspecified: Secondary | ICD-10-CM | POA: Diagnosis not present

## 2017-01-29 DIAGNOSIS — R358 Other polyuria: Secondary | ICD-10-CM | POA: Diagnosis not present

## 2017-01-29 DIAGNOSIS — R8299 Other abnormal findings in urine: Secondary | ICD-10-CM | POA: Diagnosis not present

## 2017-01-29 NOTE — Patient Instructions (Signed)
Lory Hatchell  01/29/2017   Your procedure is scheduled on: 01-31-17  Report to [facility]  Entrance take [facility]  elevators to 3rd floor to  [place] at [place].  Call this number if you have problems the morning of surgery [phone]   Remember: ONLY 1 PERSON MAY GO WITH YOU TO SHORT STAY TO GET  READY MORNING OF [place].  Do not eat food or drink liquids :After Midnight.     Take these medicines the morning of surgery with A SIP OF WATER: AMLODIPINE(NORVASC), DEXLANSOPRAZOLE(DEXILANT), INHALERS AS NEEDED, TYLENOL AS NEEDED                                 You may not have any metal on your body including hair pins and              piercings  Do not wear jewelry, make-up, lotions, powders or perfumes, deodorant             Do not wear nail polish.  Do not shave  48 hours prior to surgery.              Men may shave face and neck.   Do not bring valuables to the hospital. [place].  Contacts, dentures or bridgework may not be worn into surgery.  Leave suitcase in the car. After surgery it may be brought to your room.               Please read over the following fact sheets you were given: _____________________________________________________________________             [facility] - Preparing for Surgery Before surgery, you can play an important role.  Because skin is not sterile, your skin needs to be as free of germs as possible.  You can reduce the number of germs on your skin by washing with CHG (chlorahexidine gluconate) soap before surgery.  CHG is an antiseptic cleaner which kills germs and bonds with the skin to continue killing germs even after washing. Please DO NOT use if you have an allergy to CHG or antibacterial soaps.  If your skin becomes reddened/irritated stop using the CHG and inform your nurse when you arrive at Short Stay. Do not shave (including legs and underarms) for  at least 48 hours prior to the first CHG shower.  You may shave your face/neck. Please follow these instructions carefully:  1.  Shower with CHG Soap the night before surgery and the  morning of Surgery.  2.  If you choose to wash your hair, wash your hair first as usual with your  normal  shampoo.  3.  After you shampoo, rinse your hair and body thoroughly to remove the  shampoo.                           4.  Use CHG as you would any other liquid soap.  You can apply chg directly  to the skin and wash                       Gently with a scrungie or clean washcloth.  5.  Apply  the CHG Soap to your body ONLY FROM THE NECK DOWN.   Do not use on face/ open                           Wound or open sores. Avoid contact with eyes, ears mouth and genitals (private parts).                       Wash face,  Genitals (private parts) with your normal soap.             6.  Wash thoroughly, paying special attention to the area where your surgery  will be performed.  7.  Thoroughly rinse your body with warm water from the neck down.  8.  DO NOT shower/wash with your normal soap after using and rinsing off  the CHG Soap.                9.  Pat yourself dry with a clean towel.            10.  Wear clean pajamas.            11.  Place clean sheets on your bed the night of your first shower and do not  sleep with pets. Day of Surgery : Do not apply any lotions/deodorants the morning of surgery.  Please wear clean clothes to the hospital/surgery center.  FAILURE TO FOLLOW THESE INSTRUCTIONS MAY RESULT IN THE CANCELLATION OF YOUR SURGERY PATIENT SIGNATURE_________________________________  NURSE SIGNATURE__________________________________  ________________________________________________________________________

## 2017-01-30 ENCOUNTER — Encounter (HOSPITAL_COMMUNITY)
Admission: RE | Admit: 2017-01-30 | Discharge: 2017-01-30 | Disposition: A | Discharge status: Home / Self Care | Payer: Medicare HMO | Source: Ambulatory Visit | Attending: Surgery | Admitting: Surgery

## 2017-01-30 ENCOUNTER — Encounter (HOSPITAL_COMMUNITY): Payer: Self-pay

## 2017-01-30 ENCOUNTER — Encounter (HOSPITAL_COMMUNITY): Payer: Self-pay | Admitting: Surgery

## 2017-01-30 DIAGNOSIS — K8010 Calculus of gallbladder with chronic cholecystitis without obstruction: Secondary | ICD-10-CM

## 2017-01-30 DIAGNOSIS — E7800 Pure hypercholesterolemia, unspecified: Secondary | ICD-10-CM | POA: Diagnosis not present

## 2017-01-30 DIAGNOSIS — Z79899 Other long term (current) drug therapy: Secondary | ICD-10-CM | POA: Diagnosis not present

## 2017-01-30 DIAGNOSIS — I10 Essential (primary) hypertension: Secondary | ICD-10-CM | POA: Diagnosis not present

## 2017-01-30 DIAGNOSIS — J45909 Unspecified asthma, uncomplicated: Secondary | ICD-10-CM | POA: Diagnosis not present

## 2017-01-30 DIAGNOSIS — K219 Gastro-esophageal reflux disease without esophagitis: Secondary | ICD-10-CM | POA: Diagnosis not present

## 2017-01-30 DIAGNOSIS — Z7982 Long term (current) use of aspirin: Secondary | ICD-10-CM | POA: Diagnosis not present

## 2017-01-30 HISTORY — DX: Adverse effect of unspecified anesthetic, initial encounter: T41.45XA

## 2017-01-30 HISTORY — DX: Other complications of anesthesia, initial encounter: T88.59XA

## 2017-01-30 HISTORY — DX: Other specified postprocedural states: Z98.890

## 2017-01-30 HISTORY — DX: Nausea with vomiting, unspecified: R11.2

## 2017-01-30 HISTORY — DX: Calculus of gallbladder with chronic cholecystitis without obstruction: K80.10

## 2017-01-30 LAB — CBC
HCT: 34.4 % — ABNORMAL LOW (ref 36.0–46.0)
Hemoglobin: 11.3 g/dL — ABNORMAL LOW (ref 12.0–15.0)
MCH: 26.8 pg (ref 26.0–34.0)
MCHC: 32.8 g/dL (ref 30.0–36.0)
MCV: 81.7 fL (ref 78.0–100.0)
PLATELETS: 209 10*3/uL (ref 150–400)
RBC: 4.21 MIL/uL (ref 3.87–5.11)
RDW: 15.1 % (ref 11.5–15.5)
WBC: 5.8 10*3/uL (ref 4.0–10.5)

## 2017-01-30 LAB — BASIC METABOLIC PANEL
Anion gap: 6 (ref 5–15)
BUN: 22 mg/dL — AB (ref 6–20)
CHLORIDE: 105 mmol/L (ref 101–111)
CO2: 27 mmol/L (ref 22–32)
CREATININE: 0.94 mg/dL (ref 0.44–1.00)
Calcium: 9.7 mg/dL (ref 8.9–10.3)
GFR calc Af Amer: >60 mL/min (ref >60)
GFR calc non Af Amer: >60 mL/min (ref >60)
Glucose, Bld: 97 mg/dL (ref 65–99)
POTASSIUM: 4 mmol/L (ref 3.5–5.1)
Sodium: 138 mmol/L (ref 135–145)

## 2017-01-30 NOTE — H&P (Signed)
General Surgery [facility]  Jessa Stinson Nephew 01/23/2017 11:03 AM Location: [facility] Patient #: [id number] DOB: 06/22/1951 Married / Language: Cleophus Molt / Race: White Female   History of Present Illness Earnstine Regal MD; 01/23/2017 11:34 AM) The patient is a 66 year old female who presents for evaluation of gall stones.  Patient is referred by Dr. Oretha Caprice for evaluation of symptomatic cholelithiasis. Patient's primary care physician is Dr. Velna Hatchet. Patient presents with a 2 month history of epigastric abdominal pain. Her initial attack was in January 2018. She describes it as an interruption underneath of the lower breast bone. She was evaluated by her primary care physician and then by gastroenterology. She had an upper GI series to rule out peptic ulcer disease. She underwent abdominal ultrasound on January 01, 2017 demonstrating multiple gallstones. Patient has no prior history of hepatobiliary or pancreatic disease. She denies jaundice or acholic stools. She denies nausea or vomiting. Prior abdominal surgery includes appendectomy and hysterectomy. There is no known family history of gallbladder disease. She presents today accompanied by her husband to discuss cholecystectomy.   Past Surgical History Nance Pear, [place]; 01/23/2017 11:03 AM) Appendectomy  Colon Polyp Removal - Colonoscopy  Hysterectomy (not due to cancer) - Complete  Knee Surgery  Bilateral. Oral Surgery  Shoulder Surgery  Right. Tonsillectomy   Diagnostic Studies History Nance Pear, [place]; 01/23/2017 11:03 AM) Colonoscopy  within last year Mammogram  within last year Pap Smear  1-5 years ago  Allergies Nance Pear, CMA; 01/23/2017 11:04 AM) Demerol *ANALGESICS - OPIOID*  Nausea and vomiting Tape 1"X5yd *MEDICAL DEVICES AND SUPPLIES*  Rash Allergies Reconciled   Medication History Nance Pear, CMA; 01/23/2017 11:07 AM) Tylenol (325MG  Tablet,  Oral as needed) Active. AmLODIPine Besylate (5MG  Tablet, Oral daily) Active. Aspirin (81MG  Tablet, Oral daily) Active. Vitamin D3 (1000UNIT Capsule, Oral daily) Active. Dexilant (60MG  Capsule DR, Oral daily) Active. HydroCHLOROthiazide (25MG  Tablet, Oral daily) Active. Losartan Potassium (100MG  Tablet, Oral daily) Active. Melatonin (1MG  Tablet, Oral daily) Active. Multi-Minerals (Oral daily) Active. Omega 3 (1000MG  Capsule, Oral daily) Active. RaNITidine HCl (150MG  Tablet, Oral daily) Active. Rosuvastatin Calcium (5MG  Tablet, Oral daily) Active. Triprolidine-Pseudoeph-APAP (1.25-30-500MG  Tablet, Oral as needed) Active. ValACYclovir HCl (500MG  Tablet, Oral as needed) Active. Medications Reconciled  Social History Nance Pear, [place]; 01/23/2017 11:03 AM) Alcohol use  Occasional alcohol use. Caffeine use  Coffee, Tea. No drug use  Tobacco use  Never smoker.  Family History Nance Pear, [place]; 01/23/2017 11:03 AM) Alcohol Abuse  Brother. Arthritis  Mother. Diabetes Mellitus  Mother. Heart Disease  Mother. Heart disease in female family member before age 2  Hypertension  Mother. Ischemic Bowel Disease  Mother. Kidney Disease  Mother.  Pregnancy / Birth History Nance Pear, [place]; 01/23/2017 11:03 AM) Age at menarche  61 years. Age of menopause  >60 Contraceptive History  Contraceptive implant, Intrauterine device, Oral contraceptives. Gravida  3 Maternal age  38-25 Para  2  Other Problems Nance Pear, [place]; 01/23/2017 11:03 AM) Anxiety Disorder  Arthritis  Asthma  Diverticulosis  Gastroesophageal Reflux Disease  Hemorrhoids  High blood pressure  Hypercholesterolemia  Oophorectomy     Review of Systems Nance Pear CMA; 01/23/2017 11:03 AM) General Not Present- Appetite Loss, Chills, Fatigue, Fever, Night Sweats, Weight Gain and Weight Loss. Skin Not Present- Change in Wart/Mole, Dryness, Hives, Jaundice, New Lesions, Non-Healing  Wounds, Rash and Ulcer. HEENT Present- Hearing Loss, Ringing in the Ears, Seasonal Allergies and Wears glasses/contact lenses. Not Present- Earache, Hoarseness,  Nose Bleed, Oral Ulcers, Sinus Pain, Sore Throat, Visual Disturbances and Yellow Eyes. Respiratory Not Present- Bloody sputum, Chronic Cough, Difficulty Breathing, Snoring and Wheezing. Breast Not Present- Breast Mass, Breast Pain, Nipple Discharge and Skin Changes. Gastrointestinal Present- Abdominal Pain, Excessive gas, Indigestion and Nausea. Not Present- Bloating, Bloody Stool, Change in Bowel Habits, Chronic diarrhea, Constipation, Difficulty Swallowing, Gets full quickly at meals, Hemorrhoids, Rectal Pain and Vomiting. Female Genitourinary Present- Frequency. Not Present- Nocturia, Painful Urination, Pelvic Pain and Urgency. Musculoskeletal Not Present- Back Pain, Joint Pain, Joint Stiffness, Muscle Pain, Muscle Weakness and Swelling of Extremities. Neurological Not Present- Decreased Memory, Fainting, Headaches, Numbness, Seizures, Tingling, Tremor, Trouble walking and Weakness. Psychiatric Not Present- Anxiety, Bipolar, Change in Sleep Pattern, Depression, Fearful and Frequent crying. Endocrine Not Present- Cold Intolerance, Excessive Hunger, Hair Changes, Heat Intolerance, Hot flashes and New Diabetes. Hematology Present- Blood Thinners. Not Present- Easy Bruising, Excessive bleeding, Gland problems, HIV and Persistent Infections.  Vitals Bary Castilla Bradford CMA; 01/23/2017 11:08 AM) 01/23/2017 11:08 AM Weight: 156 lb Height: 63in Body Surface Area: 1.74 m Body Mass Index: 27.63 kg/m  Temp.: 98.61F  Pulse: 80 (Regular)  BP: 112/78 (Sitting, Left Arm, Standard)       Physical Exam Earnstine Regal MD; 01/23/2017 11:35 AM) The physical exam findings are as follows: Note:General - appears comfortable, no distress; not diaphorectic  HEENT - normocephalic; sclerae clear, gaze conjugate; mucous membranes moist, dentition  good; voice normal  Neck - symmetric on extension; no palpable anterior or posterior cervical adenopathy; no palpable masses in the thyroid bed  Chest - clear bilaterally without rhonchi, rales, or wheeze  Cor - regular rhythm with normal rate; no significant murmur  Abd - soft without distension; no palpable masses; no hepatosplenomegaly; mild tenderness to deep palpation in the epigastrium and right upper quadrant  Ext - non-tender without significant edema or lymphedema  Neuro - grossly intact; no tremor    Assessment & Plan Earnstine Regal MD; 01/23/2017 11:36 AM) 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 Patient presents for evaluation of symptomatic cholelithiasis. She is accompanied by her husband. They are provided with written literature on gallbladder disease to review at home. They are given a copy of the recent ultrasound examination.  After review of her history and a detailed review of her abdominal ultrasound report, I have recommended proceeding with laparoscopic cholecystectomy with intraoperative cholangiography. Risks and benefits of the procedure are reviewed including the potential for conversion to open surgery. We discussed the hospital stay and her postoperative recovery and return to normal activity. She understands and wishes to proceed with surgery in the near future.  The risks and benefits of the procedure have been discussed at length with the patient. The patient understands the proposed procedure, potential alternative treatments, and the course of recovery to be expected. All of the patient's questions have been answered at this time. The patient wishes to proceed with surgery.  Earnstine Regal, MD, [facility] Office: [phone]

## 2017-01-31 ENCOUNTER — Observation Stay (HOSPITAL_COMMUNITY)
Admission: RE | Admit: 2017-01-31 | Discharge: 2017-01-31 | Disposition: A | Discharge status: Home / Self Care | Payer: Medicare HMO | Source: Ambulatory Visit | Attending: Surgery | Admitting: Surgery

## 2017-01-31 ENCOUNTER — Ambulatory Visit (HOSPITAL_COMMUNITY): Payer: Medicare HMO | Admitting: Anesthesiology

## 2017-01-31 ENCOUNTER — Ambulatory Visit (HOSPITAL_COMMUNITY): Payer: Medicare HMO

## 2017-01-31 ENCOUNTER — Encounter (HOSPITAL_COMMUNITY): Payer: Self-pay | Admitting: *Deleted

## 2017-01-31 ENCOUNTER — Encounter (HOSPITAL_COMMUNITY)
Admission: RE | Disposition: A | Discharge status: Home / Self Care | Payer: Self-pay | Source: Ambulatory Visit | Attending: Surgery

## 2017-01-31 DIAGNOSIS — K8010 Calculus of gallbladder with chronic cholecystitis without obstruction: Secondary | ICD-10-CM | POA: Diagnosis not present

## 2017-01-31 DIAGNOSIS — K219 Gastro-esophageal reflux disease without esophagitis: Secondary | ICD-10-CM | POA: Diagnosis not present

## 2017-01-31 DIAGNOSIS — J45909 Unspecified asthma, uncomplicated: Secondary | ICD-10-CM | POA: Insufficient documentation

## 2017-01-31 DIAGNOSIS — R932 Abnormal findings on diagnostic imaging of liver and biliary tract: Secondary | ICD-10-CM | POA: Diagnosis not present

## 2017-01-31 DIAGNOSIS — E7800 Pure hypercholesterolemia, unspecified: Secondary | ICD-10-CM | POA: Diagnosis not present

## 2017-01-31 DIAGNOSIS — I10 Essential (primary) hypertension: Secondary | ICD-10-CM | POA: Insufficient documentation

## 2017-01-31 DIAGNOSIS — Z7982 Long term (current) use of aspirin: Secondary | ICD-10-CM | POA: Insufficient documentation

## 2017-01-31 DIAGNOSIS — Z79899 Other long term (current) drug therapy: Secondary | ICD-10-CM | POA: Insufficient documentation

## 2017-01-31 DIAGNOSIS — G4700 Insomnia, unspecified: Secondary | ICD-10-CM | POA: Diagnosis not present

## 2017-01-31 DIAGNOSIS — J302 Other seasonal allergic rhinitis: Secondary | ICD-10-CM | POA: Diagnosis not present

## 2017-01-31 DIAGNOSIS — Z419 Encounter for procedure for purposes other than remedying health state, unspecified: Secondary | ICD-10-CM

## 2017-01-31 HISTORY — PX: CHOLECYSTECTOMY: SHX55

## 2017-01-31 SURGERY — LAPAROSCOPIC CHOLECYSTECTOMY WITH INTRAOPERATIVE CHOLANGIOGRAM
Anesthesia: General

## 2017-01-31 MED ORDER — IOPAMIDOL (ISOVUE-300) INJECTION 61%
INTRAVENOUS | Status: DC | PRN
  Administered 2017-01-31: 8 mL

## 2017-01-31 MED ORDER — CHLORHEXIDINE GLUCONATE CLOTH 2 % EX PADS: 6.0000 | MEDICATED_PAD | Freq: Once | CUTANEOUS | Status: DC

## 2017-01-31 MED ORDER — SUCCINYLCHOLINE CHLORIDE 200 MG/10ML IV SOSY
PREFILLED_SYRINGE | INTRAVENOUS | Status: AC
  Filled 2017-01-31: qty 10

## 2017-01-31 MED ORDER — FENTANYL CITRATE (PF) 100 MCG/2ML IJ SOLN
INTRAMUSCULAR | Status: DC | PRN
  Administered 2017-01-31: 50 ug via INTRAVENOUS

## 2017-01-31 MED ORDER — OXYCODONE HCL 5 MG/5ML PO SOLN
5.0000 mg | Freq: Once | ORAL | Status: DC | PRN
  Filled 2017-01-31: qty 5

## 2017-01-31 MED ORDER — MIDAZOLAM HCL 5 MG/5ML IJ SOLN
INTRAMUSCULAR | Status: DC | PRN
  Administered 2017-01-31: 2 mg via INTRAVENOUS

## 2017-01-31 MED ORDER — ROCURONIUM BROMIDE 10 MG/ML (PF) SYRINGE
PREFILLED_SYRINGE | INTRAVENOUS | Status: DC | PRN
  Administered 2017-01-31: 40 mg via INTRAVENOUS

## 2017-01-31 MED ORDER — ONDANSETRON HCL 4 MG/2ML IJ SOLN
INTRAMUSCULAR | Status: DC | PRN
  Administered 2017-01-31: 4 mg via INTRAVENOUS

## 2017-01-31 MED ORDER — AMLODIPINE BESYLATE 10 MG PO TABS: 10.0000 mg | ORAL_TABLET | Freq: Every day | ORAL | Status: DC

## 2017-01-31 MED ORDER — LIDOCAINE 2% (20 MG/ML) 5 ML SYRINGE
INTRAMUSCULAR | Status: AC
  Filled 2017-01-31: qty 5

## 2017-01-31 MED ORDER — IOPAMIDOL (ISOVUE-300) INJECTION 61%
INTRAVENOUS | Status: AC
  Filled 2017-01-31: qty 50

## 2017-01-31 MED ORDER — OXYCODONE HCL 5 MG PO TABS: 5.0000 mg | ORAL_TABLET | Freq: Once | ORAL | Status: DC | PRN

## 2017-01-31 MED ORDER — DEXAMETHASONE SODIUM PHOSPHATE 10 MG/ML IJ SOLN
INTRAMUSCULAR | Status: DC | PRN
  Administered 2017-01-31: 10 mg via INTRAVENOUS

## 2017-01-31 MED ORDER — LACTATED RINGERS IV SOLN
INTRAVENOUS | Status: DC
  Administered 2017-01-31 (×2): via INTRAVENOUS

## 2017-01-31 MED ORDER — ROCURONIUM BROMIDE 50 MG/5ML IV SOSY
PREFILLED_SYRINGE | INTRAVENOUS | Status: AC
  Filled 2017-01-31: qty 5

## 2017-01-31 MED ORDER — LACTATED RINGERS IR SOLN
Status: DC | PRN
  Administered 2017-01-31: 1000 mL

## 2017-01-31 MED ORDER — CEFAZOLIN SODIUM-DEXTROSE 2-4 GM/100ML-% IV SOLN
2.0000 g | INTRAVENOUS | Status: AC
  Administered 2017-01-31: 2 g via INTRAVENOUS
  Filled 2017-01-31: qty 100

## 2017-01-31 MED ORDER — PHENYLEPHRINE 40 MCG/ML (10ML) SYRINGE FOR IV PUSH (FOR BLOOD PRESSURE SUPPORT)
PREFILLED_SYRINGE | INTRAVENOUS | Status: DC | PRN
  Administered 2017-01-31 (×2): 80 ug via INTRAVENOUS

## 2017-01-31 MED ORDER — KCL IN DEXTROSE-NACL 20-5-0.45 MEQ/L-%-% IV SOLN
INTRAVENOUS | Status: DC
  Filled 2017-01-31: qty 1000

## 2017-01-31 MED ORDER — SUGAMMADEX SODIUM 200 MG/2ML IV SOLN
INTRAVENOUS | Status: DC | PRN
  Administered 2017-01-31: 200 mg via INTRAVENOUS

## 2017-01-31 MED ORDER — MIDAZOLAM HCL 2 MG/2ML IJ SOLN
INTRAMUSCULAR | Status: AC
  Filled 2017-01-31: qty 2

## 2017-01-31 MED ORDER — ONDANSETRON HCL 4 MG/2ML IJ SOLN: 4.0000 mg | Freq: Four times a day (QID) | INTRAMUSCULAR | Status: DC | PRN

## 2017-01-31 MED ORDER — HYDROMORPHONE HCL 1 MG/ML IJ SOLN
0.2500 mg | INTRAMUSCULAR | Status: DC | PRN
  Administered 2017-01-31: 0.25 mg via INTRAVENOUS
  Administered 2017-01-31: 0.5 mg via INTRAVENOUS
  Administered 2017-01-31: 0.25 mg via INTRAVENOUS

## 2017-01-31 MED ORDER — PROPOFOL 10 MG/ML IV BOLUS
INTRAVENOUS | Status: AC
  Filled 2017-01-31: qty 20

## 2017-01-31 MED ORDER — HYDROMORPHONE HCL 1 MG/ML IJ SOLN
INTRAMUSCULAR | Status: AC
  Administered 2017-01-31: 0.25 mg via INTRAVENOUS
  Filled 2017-01-31: qty 1

## 2017-01-31 MED ORDER — PROPOFOL 10 MG/ML IV BOLUS
INTRAVENOUS | Status: DC | PRN
  Administered 2017-01-31: 160 mg via INTRAVENOUS

## 2017-01-31 MED ORDER — SUCCINYLCHOLINE CHLORIDE 200 MG/10ML IV SOSY
PREFILLED_SYRINGE | INTRAVENOUS | Status: DC | PRN
  Administered 2017-01-31: 100 mg via INTRAVENOUS

## 2017-01-31 MED ORDER — LOSARTAN POTASSIUM 50 MG PO TABS
100.0000 mg | ORAL_TABLET | Freq: Every day | ORAL | Status: DC
Start: 2017-01-31 — End: 2017-01-31

## 2017-01-31 MED ORDER — HYDROCODONE-ACETAMINOPHEN 5-325 MG PO TABS: 1.0000 | ORAL_TABLET | ORAL | Status: DC | PRN

## 2017-01-31 MED ORDER — BUPIVACAINE-EPINEPHRINE 0.25% -1:200000 IJ SOLN
INTRAMUSCULAR | Status: DC | PRN
  Administered 2017-01-31: 20 mL

## 2017-01-31 MED ORDER — LIDOCAINE 2% (20 MG/ML) 5 ML SYRINGE
INTRAMUSCULAR | Status: DC | PRN
  Administered 2017-01-31: 100 mg via INTRAVENOUS

## 2017-01-31 MED ORDER — HYDROMORPHONE HCL 1 MG/ML IJ SOLN: 1.0000 mg | INTRAMUSCULAR | Status: DC | PRN

## 2017-01-31 MED ORDER — 0.9 % SODIUM CHLORIDE (POUR BTL) OPTIME
TOPICAL | Status: DC | PRN
  Administered 2017-01-31: 1000 mL

## 2017-01-31 MED ORDER — ACETAMINOPHEN 650 MG RE SUPP: 650.0000 mg | Freq: Four times a day (QID) | RECTAL | Status: DC | PRN

## 2017-01-31 MED ORDER — BUPIVACAINE-EPINEPHRINE 0.25% -1:200000 IJ SOLN
INTRAMUSCULAR | Status: AC
  Filled 2017-01-31: qty 1

## 2017-01-31 MED ORDER — ACETAMINOPHEN 325 MG PO TABS: 650.0000 mg | ORAL_TABLET | Freq: Four times a day (QID) | ORAL | Status: DC | PRN

## 2017-01-31 MED ORDER — ONDANSETRON 4 MG PO TBDP: 4.0000 mg | ORAL_TABLET | Freq: Four times a day (QID) | ORAL | Status: DC | PRN

## 2017-01-31 MED ORDER — SCOPOLAMINE 1 MG/3DAYS TD PT72: 1.0000 | MEDICATED_PATCH | Freq: Once | TRANSDERMAL | Status: DC

## 2017-01-31 MED ORDER — HYDROCHLOROTHIAZIDE 25 MG PO TABS: 25.0000 mg | ORAL_TABLET | Freq: Every day | ORAL | Status: DC

## 2017-01-31 MED ORDER — HYDROCODONE-ACETAMINOPHEN 5-325 MG PO TABS: 1.0000 | ORAL_TABLET | ORAL | 0 refills | Status: DC | PRN

## 2017-01-31 MED ORDER — FENTANYL CITRATE (PF) 100 MCG/2ML IJ SOLN
INTRAMUSCULAR | Status: AC
  Filled 2017-01-31: qty 2

## 2017-01-31 MED ORDER — SCOPOLAMINE 1 MG/3DAYS TD PT72
MEDICATED_PATCH | TRANSDERMAL | Status: AC
  Filled 2017-01-31: qty 1

## 2017-01-31 SURGICAL SUPPLY — 31 items
APPLIER CLIP ROT 10 11.4 M/L (STAPLE) ×2
CABLE HIGH FREQUENCY MONO STRZ (ELECTRODE) ×2 IMPLANT
CHLORAPREP W/TINT 26ML (MISCELLANEOUS) ×4 IMPLANT
CLIP APPLIE ROT 10 11.4 M/L (STAPLE) ×1 IMPLANT
COVER MAYO STAND STRL (DRAPES) ×2 IMPLANT
COVER SURGICAL LIGHT HANDLE (MISCELLANEOUS) ×2 IMPLANT
DECANTER SPIKE VIAL GLASS SM (MISCELLANEOUS) ×2 IMPLANT
DERMABOND ADVANCED (GAUZE/BANDAGES/DRESSINGS) ×1
DERMABOND ADVANCED .7 DNX12 (GAUZE/BANDAGES/DRESSINGS) ×1 IMPLANT
DRAPE C-ARM 42X120 X-RAY (DRAPES) ×2 IMPLANT
ELECT REM PT RETURN 9FT ADLT (ELECTROSURGICAL) ×2
ELECTRODE REM PT RTRN 9FT ADLT (ELECTROSURGICAL) ×1 IMPLANT
GAUZE SPONGE 2X2 8PLY STRL LF (GAUZE/BANDAGES/DRESSINGS) ×1 IMPLANT
GLOVE SURG ORTHO 8.0 STRL STRW (GLOVE) ×2 IMPLANT
GOWN STRL REUS W/TWL XL LVL3 (GOWN DISPOSABLE) ×4 IMPLANT
HEMOSTAT SURGICEL 4X8 (HEMOSTASIS) IMPLANT
KIT BASIN OR (CUSTOM PROCEDURE TRAY) ×2 IMPLANT
POUCH SPECIMEN RETRIEVAL 10MM (ENDOMECHANICALS) ×2 IMPLANT
SCISSORS LAP 5X35 DISP (ENDOMECHANICALS) ×2 IMPLANT
SET CHOLANGIOGRAPH MIX (MISCELLANEOUS) ×2 IMPLANT
SET IRRIG TUBING LAPAROSCOPIC (IRRIGATION / IRRIGATOR) ×2 IMPLANT
SLEEVE XCEL OPT CAN 5 100 (ENDOMECHANICALS) ×2 IMPLANT
SPONGE GAUZE 2X2 STER 10/PKG (GAUZE/BANDAGES/DRESSINGS) ×1
SUT MNCRL AB 4-0 PS2 18 (SUTURE) ×2 IMPLANT
TOWEL OR 17X26 10 PK STRL BLUE (TOWEL DISPOSABLE) ×2 IMPLANT
TOWEL OR NON WOVEN STRL DISP B (DISPOSABLE) ×2 IMPLANT
TRAY LAPAROSCOPIC (CUSTOM PROCEDURE TRAY) ×2 IMPLANT
TROCAR BLADELESS OPT 5 100 (ENDOMECHANICALS) ×2 IMPLANT
TROCAR XCEL BLUNT TIP 100MML (ENDOMECHANICALS) ×2 IMPLANT
TROCAR XCEL NON-BLD 11X100MML (ENDOMECHANICALS) ×2 IMPLANT
TUBING INSUF HEATED (TUBING) IMPLANT

## 2017-01-31 NOTE — Anesthesia Procedure Notes (Signed)
Procedure Name: Intubation Date/Time: 01/31/2017 10:08 AM Performed by: Lind Covert Pre-anesthesia Checklist: Emergency Drugs available, Timeout performed, Patient identified, Suction available and Patient being monitored Patient Re-evaluated:Patient Re-evaluated prior to inductionOxygen Delivery Method: Circle system utilized Preoxygenation: Pre-oxygenation with 100% oxygen Intubation Type: IV induction Laryngoscope Size: Mac and 3 Grade View: Grade I Tube type: Oral Tube size: 7.0 mm Number of attempts: 1 Airway Equipment and Method: Stylet Placement Confirmation: ETT inserted through vocal cords under direct vision,  positive ETCO2 and breath sounds checked- equal and bilateral Secured at: 22 cm Tube secured with: Tape Dental Injury: Teeth and Oropharynx as per pre-operative assessment

## 2017-01-31 NOTE — Progress Notes (Signed)
Patient discharged to home with family, discharge instructions reviewed with patient who verbalized understanding. New RX given to patient. 

## 2017-01-31 NOTE — Op Note (Signed)
Procedure Note  Pre-operative Diagnosis:  Chronic cholecystitis, cholelithiasis  Post-operative Diagnosis:  same  Surgeon:  Earnstine Regal, MD, FACS  Assistant:  none   Procedure:  Laparoscopic cholecystectomy with intra-operative cholangiography  Anesthesia:  General  Estimated Blood Loss:  minimal  Drains: none         Specimen: Gallbladder to pathology  Indications:  The patient is a 66 year old female who presents for evaluation of gall stones.  Patient is referred by Dr. Oretha Caprice for evaluation of symptomatic cholelithiasis. Patient's primary care physician is Dr. Velna Hatchet. Patient presents with a 2 month history of epigastric abdominal pain. Her initial attack was in January 2018. She describes it as an interruption underneath of the lower breast bone. She was evaluated by her primary care physician and then by gastroenterology. She had an upper GI series to rule out peptic ulcer disease. She underwent abdominal ultrasound on January 01, 2017 demonstrating multiple gallstones. Patient has no prior history of hepatobiliary or pancreatic disease. She denies jaundice or acholic stools. She denies nausea or vomiting. Prior abdominal surgery includes appendectomy and hysterectomy. There is no known family history of gallbladder disease.   Procedure Details:  The patient was seen in the pre-op holding area. The risks, benefits, complications, treatment options, and expected outcomes were previously discussed with the patient. The patient agreed with the proposed plan and has signed the informed consent form.  The patient was brought to the Operating Room, identified as Courtney Crane and the procedure verified as laparoscopic cholecystectomy with intraoperative cholangiography. A "time out" was completed and the above information confirmed.  The patient was placed in the supine position. Following induction of general anesthesia, the abdomen was prepped and draped  in the usual aseptic fashion.  An incision was made in the skin near the umbilicus. The midline fascia was incised and the peritoneal cavity was entered and a Hasson canula was introduced under direct vision.  The Hasson canula was secured with a 0-Vicryl pursestring suture. Pneumoperitoneum was established with carbon dioxide. Additional trocars were introduced under direct vision along the right costal margin in the midline, mid-clavicular line, and anterior axillary line.   The gallbladder was identified and the fundus grasped and retracted cephalad. Adhesions were taken down bluntly and the electrocautery was utilized as needed, taking care not to injure any adjacent structures. The infundibulum was grasped and retracted laterally, exposing the peritoneum overlying the triangle of Calot. The peritoneum was incised and structures exposed with blunt dissection. The cystic duct was clearly identified, bluntly dissected circumferentially, and clipped at the neck of the gallbladder.  An incision was made in the cystic duct and the cholangiogram catheter introduced. The catheter was secured using an ligaclip.  Real-time cholangiography was performed using C-arm fluoroscopy.  There was rapid filling of a normal caliber common bile duct.  There was reflux of contrast into the left and right hepatic ductal systems.  There was free flow distally into the duodenum without filling defect or obstruction.  The catheter was removed from the peritoneal cavity.  The cystic duct was then ligated with surgical clips and divided. The cystic artery was identified, dissected circumferentially, ligated with ligaclips, and divided.  The gallbladder was dissected away from the liver bed using the electrocautery for hemostasis. The gallbladder was completely removed from the liver and placed into an endocatch bag. The gallbladder was removed in the endocatch bag through the umbilical port site and submitted to pathology for  review.  The  right upper quadrant was irrigated and the gallbladder bed was inspected. Hemostasis was achieved with the electrocautery.  Pneumoperitoneum was released after viewing removal of the trocars with good hemostasis noted. The umbilical wound was irrigated and the fascia was then closed with the pursestring suture.  Local anesthetic was infiltrated at all port sites. The skin incisions were closed with 4-0 Monocril subcuticular sutures and steri-strips and dressings were applied.  Instrument, sponge, and needle counts were correct at the conclusion of the case.  The patient was awakened from anesthesia and brought to the recovery room in stable condition.  The patient tolerated the procedure well.   Earnstine Regal, MD, [facility] Office: [phone]

## 2017-01-31 NOTE — Anesthesia Preprocedure Evaluation (Signed)
Anesthesia Evaluation  Patient identified by MRN, date of birth, ID band Patient awake    Reviewed: Allergy & Precautions, H&P , NPO status , Patient's Chart, lab work & pertinent test results  History of Anesthesia Complications (+) PONV  Airway Mallampati: II   Neck ROM: full    Dental   Pulmonary asthma ,    breath sounds clear to auscultation       Cardiovascular hypertension,  Rhythm:regular Rate:Normal     Neuro/Psych PSYCHIATRIC DISORDERS Anxiety Depression    GI/Hepatic GERD  ,  Endo/Other    Renal/GU      Musculoskeletal  (+) Arthritis ,   Abdominal   Peds  Hematology   Anesthesia Other Findings   Reproductive/Obstetrics                             Anesthesia Physical Anesthesia Plan  ASA: II  Anesthesia Plan: General   Post-op Pain Management:    Induction: Intravenous  Airway Management Planned: Oral ETT  Additional Equipment:   Intra-op Plan:   Post-operative Plan: Extubation in OR  Informed Consent: I have reviewed the patients History and Physical, chart, labs and discussed the procedure including the risks, benefits and alternatives for the proposed anesthesia with the patient or authorized representative who has indicated his/her understanding and acceptance.     Plan Discussed with: CRNA, Anesthesiologist and Surgeon  Anesthesia Plan Comments:         Anesthesia Quick Evaluation

## 2017-01-31 NOTE — Transfer of Care (Signed)
Immediate Anesthesia Transfer of Care Note  Patient: Courtney Crane  Procedure(s) Performed: Procedure(s): LAPAROSCOPIC CHOLECYSTECTOMY WITH INTRAOPERATIVE CHOLANGIOGRAM (N/A)  Patient Location: PACU  Anesthesia Type:General  Level of Consciousness: sedated  Airway & Oxygen Therapy: Patient Spontanous Breathing and Patient connected to face mask oxygen  Post-op Assessment: Report given to RN and Post -op Vital signs reviewed and stable  Post vital signs: Reviewed and stable  Last Vitals:  Vitals:   01/31/17 0816  BP: (!) 145/64  Pulse: 74  Resp: 16  Temp: 36.7 C    Last Pain:  Vitals:   01/31/17 0912  TempSrc:   PainSc: 3       Patients Stated Pain Goal: 3 (06/77/03 4035)  Complications: No apparent anesthesia complications

## 2017-01-31 NOTE — Discharge Summary (Signed)
Physician Discharge Summary [facility]  Patient ID: Courtney Crane MRN: [id number] DOB/AGE: [birth year] 66 y.o.  Admit date: 01/31/2017 Discharge date: 01/31/2017  Admission Diagnoses:  Chronic cholecystitis, cholelithiasis  Discharge Diagnoses:  Principal Problem:   Cholelithiasis with chronic cholecystitis   Discharged Condition: good  Hospital Course: Patient was admitted for observation following gallbladder surgery.  Post op course was uncomplicated.  Pain was well controlled.  Tolerated diet.  Patient was prepared for discharge home on the day of surgery.  Consults: None  Treatments: surgery: lap chole with IOC  Discharge Exam: Blood pressure (!) 114/56, pulse 71, temperature 98.3 F (36.8 C), temperature source Oral, resp. rate 16, height 5\' 2"  (1.575 m), weight 70.4 kg (155 lb 3 oz), SpO2 97 %. HEENT - clear Abd - soft without distension; wounds dry with Dermabond in place  Disposition: Home  Discharge Instructions    Diet - low sodium heart healthy    Complete by:  As directed    Discharge instructions    Complete by:  As directed    [facility]  LAPAROSCOPIC SURGERY:  POST-OP INSTRUCTIONS  Always review your discharge instruction sheet given to you by the facility where your surgery was performed.  A prescription for pain medication may be given to you upon discharge.  Take your pain medication as prescribed.  If narcotic pain medicine is not needed, then you may take acetaminophen (Tylenol) or ibuprofen (Advil) as needed.  Take your usually prescribed medications unless otherwise directed.  If you need a refill on your pain medication, please contact your pharmacy.  They will contact our office to request authorization. Prescriptions will not be filled after 5 P.M. or on weekends.  You should follow a light diet the first few days after arrival home, such as soup and crackers or toast.  Be sure to include plenty of  fluids daily.  Most patients will experience some swelling and bruising in the area of the incisions.  Ice packs will help.  Swelling and bruising can take several days to resolve.   It is common to experience some constipation after surgery.  Increasing fluid intake and taking a stool softener (such as Colace) will usually help or prevent this problem from occurring.  A mild laxative (Milk of Magnesia or Miralax) should be taken according to package instructions if there has been no bowel movement after 48 hours.  You will have steri-strips and a gauze dressing over your incisions.  You may remove the gauze bandage on the second day after surgery, and you may shower at that time.  Leave your steri-strips (small skin tapes) in place directly over the incision.  These strips should remain on the skin for 5-7 days and then be removed.  You may get them wet in the shower and pat them dry.  Any sutures or staples will be removed at the office during your follow-up visit.  ACTIVITIES:  You may resume regular (light) daily activities beginning the next day - such as daily self-care, walking, climbing stairs - gradually increasing activities as tolerated.  You may have sexual intercourse when it is comfortable.  Refrain from any heavy lifting or straining until approved by your doctor.  You may drive when you are no longer taking prescription pain medication, you can comfortably wear a seatbelt, and you can safely maneuver your car and apply brakes.  You should see your doctor in the office for a follow-up appointment approximately 2-3 weeks after your  surgery.  Make sure that you call for this appointment within a day or two after you arrive home to insure a convenient appointment time.  WHEN TO CALL YOUR DOCTOR: Fever over 101.0 Inability to urinate Continued bleeding from incision Increased pain, redness, or drainage from the incision Increasing abdominal pain  The clinic staff is available to  answer your questions during regular business hours.  Please don't hesitate to call and ask to speak to one of the nurses for clinical concerns.  If you have a medical emergency, go to the nearest emergency room or call 911.  A surgeon from [facility] is always on call for the hospital.  Earnstine Regal, MD, [facility] Office: [place] Free:  [place] [phone]  Website: [email or web address]   Increase activity slowly    Complete by:  As directed    No dressing needed    Complete by:  As directed      Allergies as of 01/31/2017      Reactions   Demerol [meperidine] Nausea Only   Tape Rash      Medication List    TAKE these medications   acetaminophen 325 MG tablet Commonly known as:  TYLENOL Take 650 mg by mouth every 6 (six) hours as needed for mild pain or moderate pain.   amLODipine 10 MG tablet Commonly known as:  NORVASC Take 10 mg by mouth daily.   ASMANEX HFA 200 MCG/ACT Aero Generic drug:  Mometasone Furoate Inhale 2 puffs into the lungs daily as needed.   ASMANEX HFA 100 MCG/ACT Aero Generic drug:  Mometasone Furoate Inhale 2 puffs into the lungs daily as needed.   aspirin 81 MG tablet Take 81 mg by mouth daily.   DEXILANT 60 MG capsule Generic drug:  dexlansoprazole Take 60 mg by mouth daily.   Fish Oil 1000 MG Caps Take 1 capsule by mouth 2 (two) times daily.   hydrochlorothiazide 25 MG tablet Commonly known as:  HYDRODIURIL Take 25 mg by mouth daily.   HYDROcodone-acetaminophen 5-325 MG tablet Commonly known as:  NORCO/VICODIN Take 1-2 tablets by mouth every 4 (four) hours as needed for moderate pain.   ibuprofen 200 MG tablet Commonly known as:  ADVIL,MOTRIN Take 200 mg by mouth every 6 (six) hours as needed.   loratadine 10 MG tablet Commonly known as:  CLARITIN Take 10 mg by mouth daily as needed for allergies.   losartan 100 MG tablet Commonly known as:   COZAAR Take 100 mg by mouth daily.   MELATONIN PO Take 1 tablet by mouth at bedtime as needed.   multivitamin tablet Take 1 tablet by mouth daily.   ranitidine 150 MG tablet Commonly known as:  ZANTAC Take 150 mg by mouth at bedtime.   rosuvastatin 5 MG tablet Commonly known as:  CRESTOR Take 5 mg by mouth every Monday, Wednesday, and Friday.   valACYclovir 500 MG tablet Commonly known as:  VALTREX Take 1 tablet twice a day for 5 days at the start of an outbreak. What changed:  how much to take  how to take this  when to take this  reasons to take this  additional instructions   Vitamin D3 10000 units Tabs Take 1 tablet by mouth once a week.      Follow-up Information    Eretria Manternach M, MD. Schedule an appointment as soon as possible for a visit in 3 week(s).   Specialty:  General Surgery Contact information: [address]  [place] [address] [phone]           Earnstine Regal, MD, [facility] Office: [phone]   Signed: Earnstine Regal 01/31/2017, 5:21 PM

## 2017-01-31 NOTE — Anesthesia Postprocedure Evaluation (Addendum)
Anesthesia Post Note  Patient: Courtney Crane  Procedure(s) Performed: Procedure(s) (LRB): LAPAROSCOPIC CHOLECYSTECTOMY WITH INTRAOPERATIVE CHOLANGIOGRAM (N/A)  Patient location during evaluation: PACU Anesthesia Type: General Level of consciousness: awake and alert and patient cooperative Pain management: pain level controlled Vital Signs Assessment: post-procedure vital signs reviewed and stable Respiratory status: spontaneous breathing and respiratory function stable Cardiovascular status: stable Anesthetic complications: no       Last Vitals:  Vitals:   01/31/17 1443 01/31/17 1546  BP: (!) 115/56 (!) 114/56  Pulse: 72 71  Resp: 16 16  Temp: 37.1 C 36.8 C    Last Pain:  Vitals:   01/31/17 1546  TempSrc: Oral  PainSc:                  [place] S

## 2017-02-01 ENCOUNTER — Encounter (HOSPITAL_COMMUNITY): Payer: Self-pay | Admitting: Surgery

## 2017-02-05 DIAGNOSIS — R7309 Other abnormal glucose: Secondary | ICD-10-CM | POA: Diagnosis not present

## 2017-02-05 DIAGNOSIS — Z1389 Encounter for screening for other disorder: Secondary | ICD-10-CM | POA: Diagnosis not present

## 2017-02-05 DIAGNOSIS — E784 Other hyperlipidemia: Secondary | ICD-10-CM | POA: Diagnosis not present

## 2017-02-05 DIAGNOSIS — R69 Illness, unspecified: Secondary | ICD-10-CM | POA: Diagnosis not present

## 2017-02-05 DIAGNOSIS — J45998 Other asthma: Secondary | ICD-10-CM | POA: Diagnosis not present

## 2017-02-05 DIAGNOSIS — I10 Essential (primary) hypertension: Secondary | ICD-10-CM | POA: Diagnosis not present

## 2017-02-05 DIAGNOSIS — D508 Other iron deficiency anemias: Secondary | ICD-10-CM | POA: Diagnosis not present

## 2017-02-05 DIAGNOSIS — Z0000 Encounter for general adult medical examination without abnormal findings: Secondary | ICD-10-CM | POA: Diagnosis not present

## 2017-02-05 DIAGNOSIS — E559 Vitamin D deficiency, unspecified: Secondary | ICD-10-CM | POA: Diagnosis not present

## 2017-02-05 DIAGNOSIS — R1013 Epigastric pain: Secondary | ICD-10-CM | POA: Diagnosis not present

## 2017-02-06 ENCOUNTER — Ambulatory Visit: Payer: Medicare Other | Admitting: Gastroenterology

## 2017-02-13 DIAGNOSIS — Z6826 Body mass index (BMI) 26.0-26.9, adult: Secondary | ICD-10-CM | POA: Diagnosis not present

## 2017-02-13 DIAGNOSIS — M2550 Pain in unspecified joint: Secondary | ICD-10-CM | POA: Diagnosis not present

## 2017-02-13 DIAGNOSIS — L500 Allergic urticaria: Secondary | ICD-10-CM | POA: Diagnosis not present

## 2017-02-13 DIAGNOSIS — L309 Dermatitis, unspecified: Secondary | ICD-10-CM | POA: Diagnosis not present

## 2017-02-13 DIAGNOSIS — L308 Other specified dermatitis: Secondary | ICD-10-CM | POA: Diagnosis not present

## 2017-02-13 DIAGNOSIS — D508 Other iron deficiency anemias: Secondary | ICD-10-CM | POA: Diagnosis not present

## 2017-02-25 ENCOUNTER — Other Ambulatory Visit (HOSPITAL_COMMUNITY): Payer: Self-pay | Admitting: *Deleted

## 2017-02-26 ENCOUNTER — Ambulatory Visit (HOSPITAL_COMMUNITY)
Admission: RE | Admit: 2017-02-26 | Discharge: 2017-02-26 | Disposition: A | Discharge status: Home / Self Care | Payer: Medicare HMO | Source: Ambulatory Visit | Attending: Internal Medicine | Admitting: Internal Medicine

## 2017-02-26 DIAGNOSIS — D649 Anemia, unspecified: Secondary | ICD-10-CM | POA: Diagnosis present

## 2017-02-26 MED ORDER — SODIUM CHLORIDE 0.9 % IV SOLN
510.0000 mg | INTRAVENOUS | Status: DC
  Administered 2017-02-26: 510 mg via INTRAVENOUS
  Filled 2017-02-26: qty 17

## 2017-02-26 NOTE — Discharge Instructions (Signed)

## 2017-03-04 IMAGING — RF DG UGI W/ HIGH DENSITY W/KUB
6 series · 15 of 18 positions shown · non-contrast
Comparison: None.

CLINICAL DATA: Gastroesophageal reflux disease, esophagitis,
history of esophageal dilatation

EXAM:
UPPER GI SERIES WITH KUB
TECHNIQUE: After obtaining a scout radiograph a routine upper GI series was
performed using thin and high density barium.
FLUOROSCOPY TIME:  Fluoroscopy Time:  1 minutes, 54 seconds
Radiation Exposure Index (if provided by the fluoroscopic device):
213 mGy
Number of Acquired Spot Images: 7

[Series 2: one shot · 1 of 1 slices shown (1 of 2)]
[im 1/1]
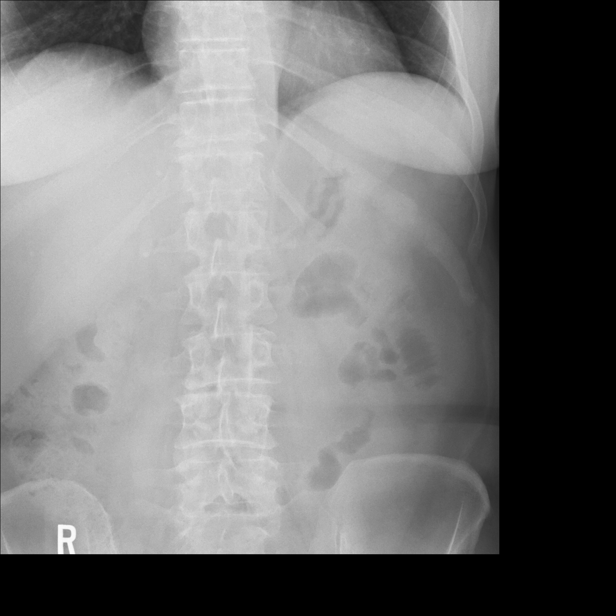

[Series 3: sequence · 2 of 10 frames shown (1 of 4)]
[frame 2/10]
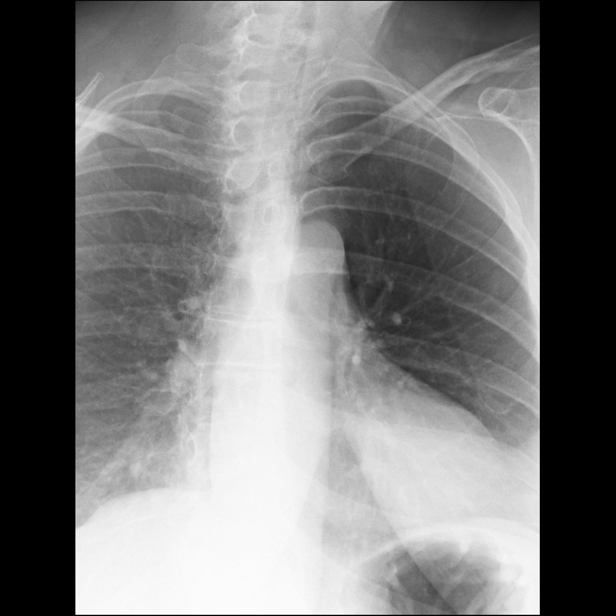
[frame 9/10]
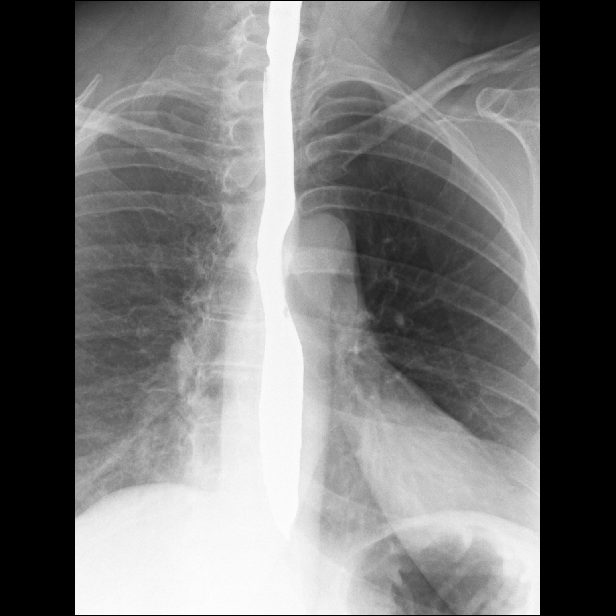

[Series 4: sequence · 3 of 3 frames shown (2 of 4)]
[frame 1/3]
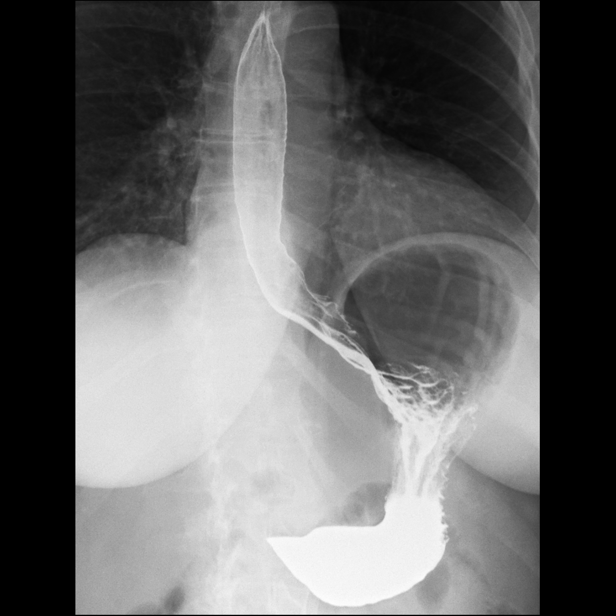
[frame 2/3]
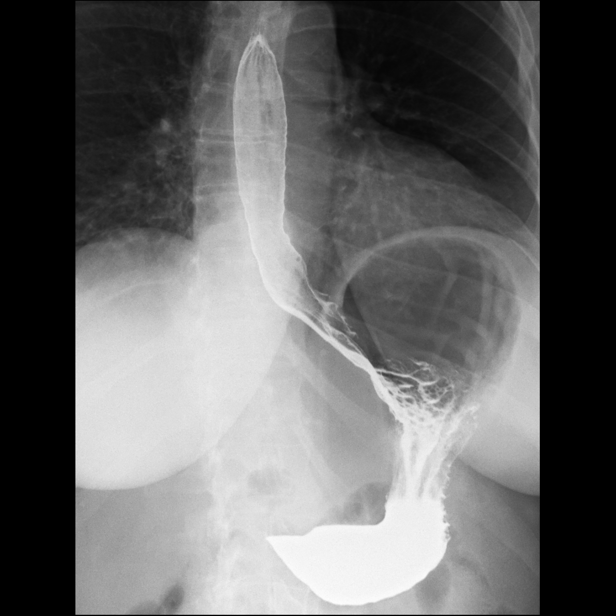
[frame 3/3]
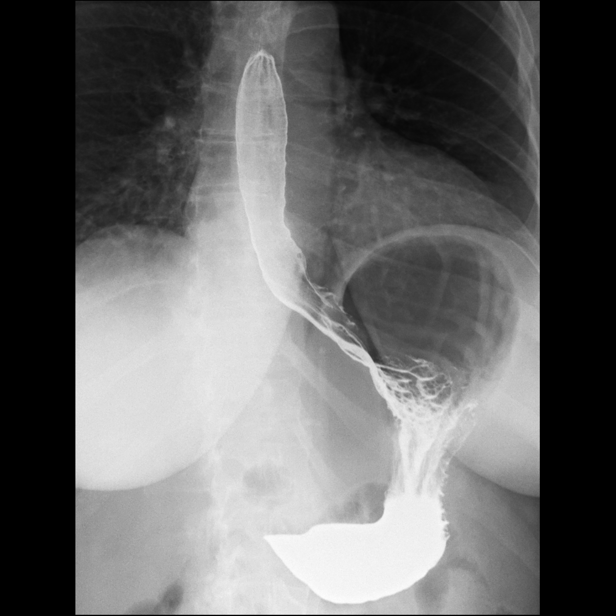

[Series 5: sequence · 2 of 3 frames shown (3 of 4)]
[frame 1/3]
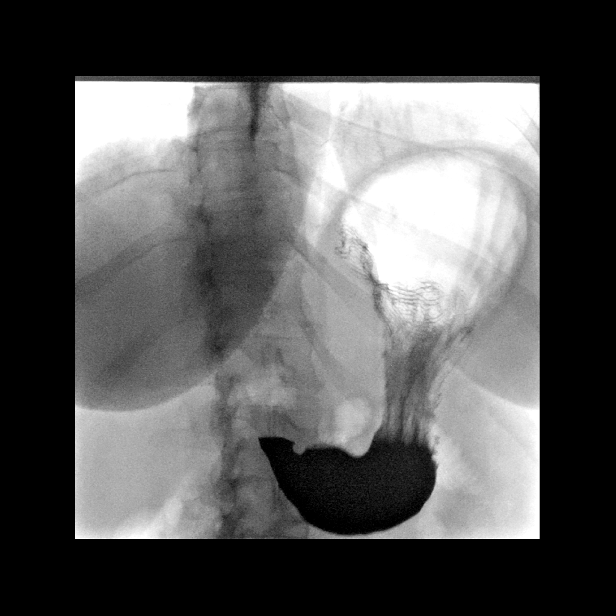
[frame 3/3]
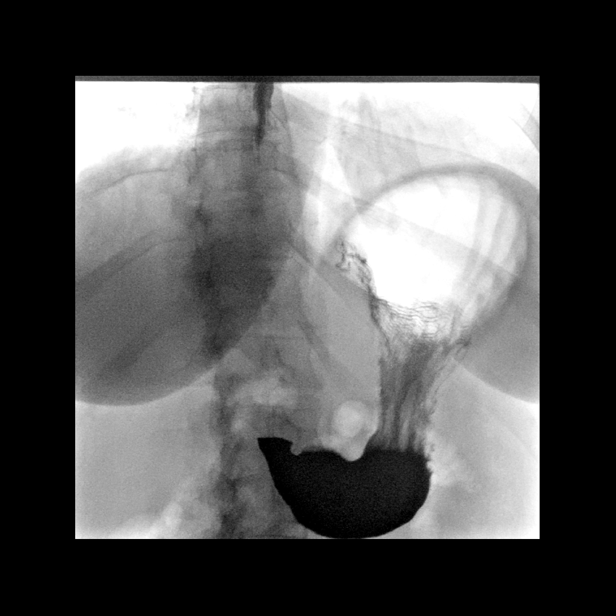

[Series 6: one shot · 4 of 4 slices shown (2 of 2)]
[im 1/4]
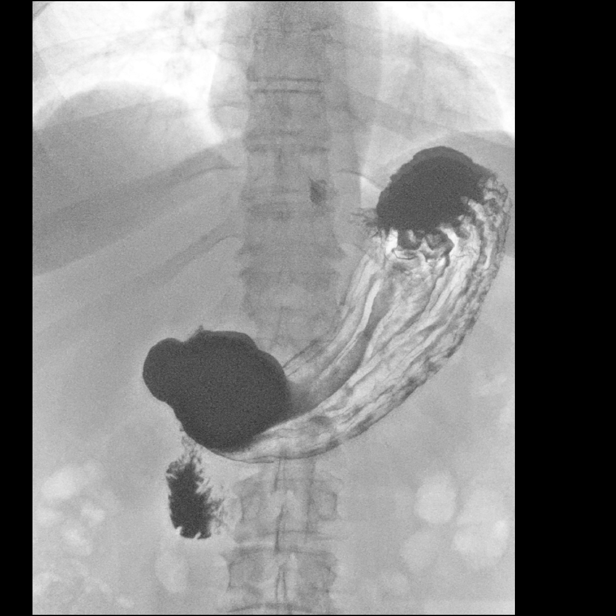
[im 2/4]
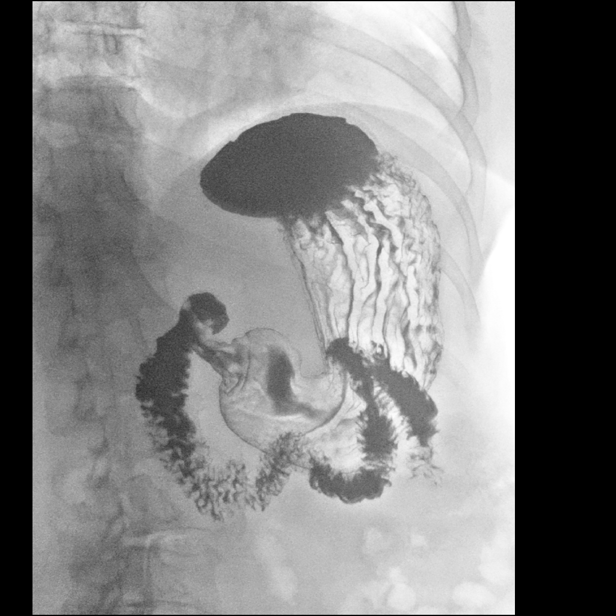
[im 3/4]
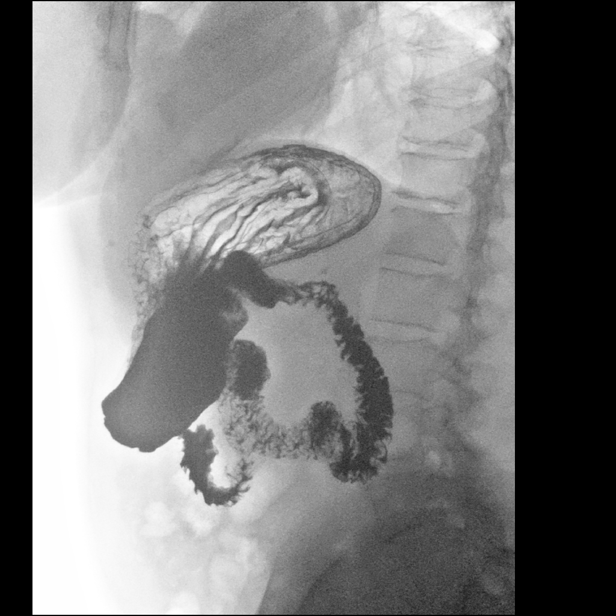
[im 4/4]
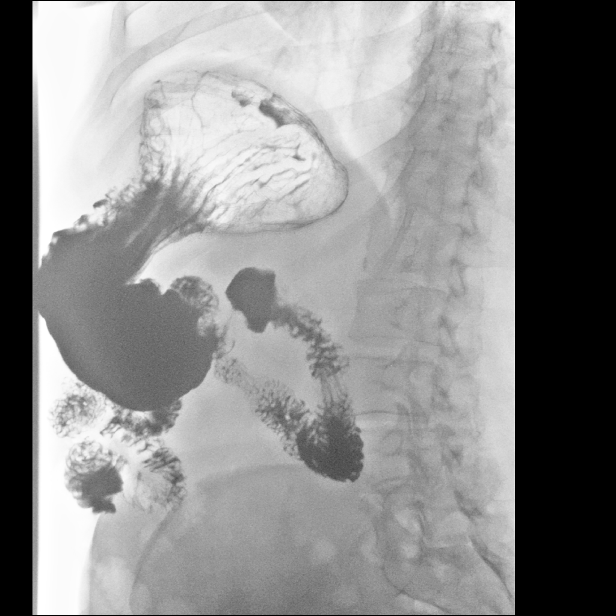

[Series 7: sequence · 3 of 62 frames shown (4 of 4)]
[frame 20/62]
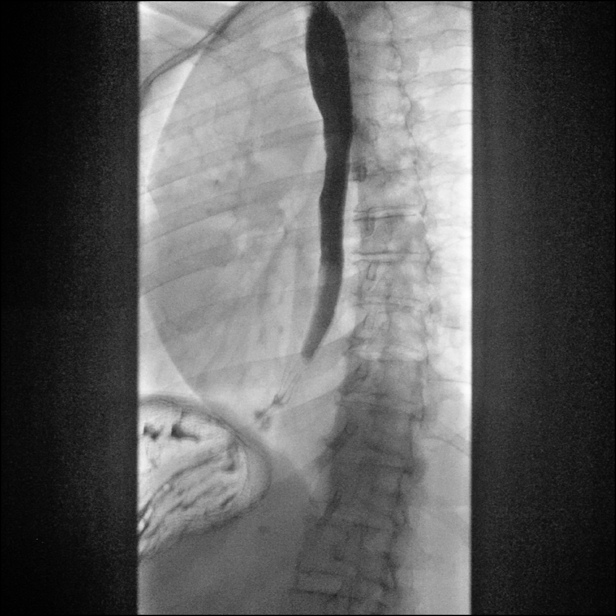
[frame 32/62]
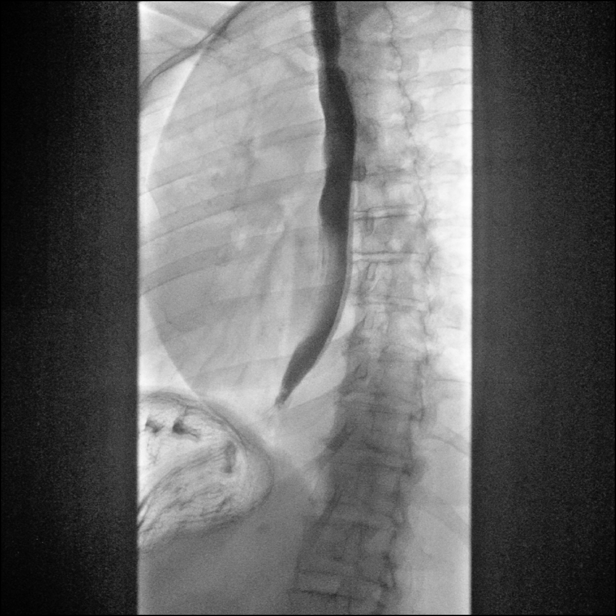
[frame 53/62]
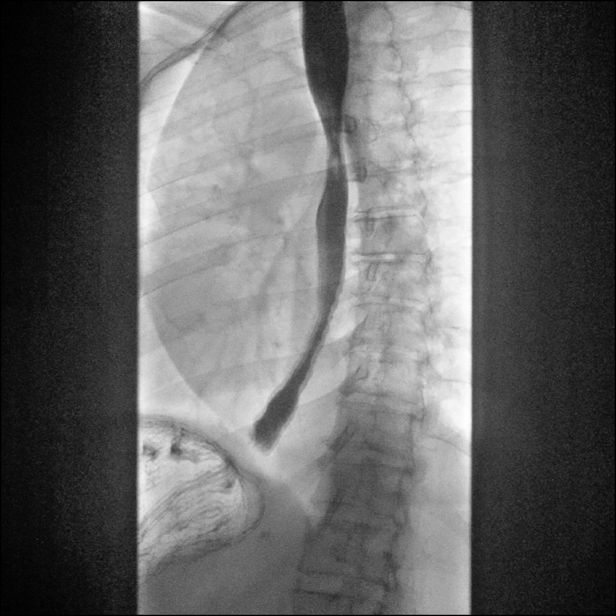

[15 of 18 positions shown; findings below may reference images not displayed]

FINDINGS: Normal esophageal peristalsis.

No fixed esophageal narrowing or stricture.

Normal gastric folds.

Proximal small bowel is within normal limits.
IMPRESSION: Negative upper GI.

## 2017-03-05 ENCOUNTER — Ambulatory Visit (HOSPITAL_COMMUNITY)
Admission: RE | Admit: 2017-03-05 | Discharge: 2017-03-05 | Disposition: A | Discharge status: Home / Self Care | Payer: Medicare HMO | Source: Ambulatory Visit | Attending: Internal Medicine | Admitting: Internal Medicine

## 2017-03-05 DIAGNOSIS — D649 Anemia, unspecified: Secondary | ICD-10-CM | POA: Diagnosis present

## 2017-03-05 MED ORDER — FERUMOXYTOL INJECTION 510 MG/17 ML
510.0000 mg | INTRAVENOUS | Status: DC
  Administered 2017-03-05: 510 mg via INTRAVENOUS
  Filled 2017-03-05: qty 17

## 2017-03-06 DIAGNOSIS — M79641 Pain in right hand: Secondary | ICD-10-CM | POA: Diagnosis not present

## 2017-03-06 DIAGNOSIS — M150 Primary generalized (osteo)arthritis: Secondary | ICD-10-CM | POA: Diagnosis not present

## 2017-03-06 DIAGNOSIS — R21 Rash and other nonspecific skin eruption: Secondary | ICD-10-CM | POA: Diagnosis not present

## 2017-03-06 DIAGNOSIS — E663 Overweight: Secondary | ICD-10-CM | POA: Diagnosis not present

## 2017-03-06 DIAGNOSIS — R768 Other specified abnormal immunological findings in serum: Secondary | ICD-10-CM | POA: Diagnosis not present

## 2017-03-06 DIAGNOSIS — D509 Iron deficiency anemia, unspecified: Secondary | ICD-10-CM | POA: Diagnosis not present

## 2017-03-06 DIAGNOSIS — M2550 Pain in unspecified joint: Secondary | ICD-10-CM | POA: Diagnosis not present

## 2017-03-06 DIAGNOSIS — M79642 Pain in left hand: Secondary | ICD-10-CM | POA: Diagnosis not present

## 2017-03-06 DIAGNOSIS — Z6828 Body mass index (BMI) 28.0-28.9, adult: Secondary | ICD-10-CM | POA: Diagnosis not present

## 2017-03-06 DIAGNOSIS — R5383 Other fatigue: Secondary | ICD-10-CM | POA: Diagnosis not present

## 2017-03-08 DIAGNOSIS — M310 Hypersensitivity angiitis: Secondary | ICD-10-CM | POA: Diagnosis not present

## 2017-03-27 DIAGNOSIS — D509 Iron deficiency anemia, unspecified: Secondary | ICD-10-CM | POA: Diagnosis not present

## 2017-03-27 DIAGNOSIS — D508 Other iron deficiency anemias: Secondary | ICD-10-CM | POA: Diagnosis not present

## 2017-04-05 DIAGNOSIS — M2550 Pain in unspecified joint: Secondary | ICD-10-CM | POA: Diagnosis not present

## 2017-04-05 DIAGNOSIS — M064 Inflammatory polyarthropathy: Secondary | ICD-10-CM | POA: Diagnosis not present

## 2017-04-05 DIAGNOSIS — Z6828 Body mass index (BMI) 28.0-28.9, adult: Secondary | ICD-10-CM | POA: Diagnosis not present

## 2017-04-05 DIAGNOSIS — M150 Primary generalized (osteo)arthritis: Secondary | ICD-10-CM | POA: Diagnosis not present

## 2017-04-05 DIAGNOSIS — E663 Overweight: Secondary | ICD-10-CM | POA: Diagnosis not present

## 2017-04-05 DIAGNOSIS — R768 Other specified abnormal immunological findings in serum: Secondary | ICD-10-CM | POA: Diagnosis not present

## 2017-04-24 DIAGNOSIS — M1812 Unilateral primary osteoarthritis of first carpometacarpal joint, left hand: Secondary | ICD-10-CM | POA: Diagnosis not present

## 2017-04-24 DIAGNOSIS — G5602 Carpal tunnel syndrome, left upper limb: Secondary | ICD-10-CM | POA: Diagnosis not present

## 2017-04-26 DIAGNOSIS — M1812 Unilateral primary osteoarthritis of first carpometacarpal joint, left hand: Secondary | ICD-10-CM | POA: Diagnosis not present

## 2017-04-26 DIAGNOSIS — M25542 Pain in joints of left hand: Secondary | ICD-10-CM | POA: Diagnosis not present

## 2017-04-30 DIAGNOSIS — G5602 Carpal tunnel syndrome, left upper limb: Secondary | ICD-10-CM | POA: Diagnosis not present

## 2017-05-01 DIAGNOSIS — G5602 Carpal tunnel syndrome, left upper limb: Secondary | ICD-10-CM | POA: Diagnosis not present

## 2017-05-17 DIAGNOSIS — G5602 Carpal tunnel syndrome, left upper limb: Secondary | ICD-10-CM | POA: Diagnosis not present

## 2017-06-05 DIAGNOSIS — Z6829 Body mass index (BMI) 29.0-29.9, adult: Secondary | ICD-10-CM | POA: Diagnosis not present

## 2017-06-05 DIAGNOSIS — E663 Overweight: Secondary | ICD-10-CM | POA: Diagnosis not present

## 2017-06-05 DIAGNOSIS — M064 Inflammatory polyarthropathy: Secondary | ICD-10-CM | POA: Diagnosis not present

## 2017-06-05 DIAGNOSIS — M2550 Pain in unspecified joint: Secondary | ICD-10-CM | POA: Diagnosis not present

## 2017-06-05 DIAGNOSIS — M150 Primary generalized (osteo)arthritis: Secondary | ICD-10-CM | POA: Diagnosis not present

## 2017-06-05 DIAGNOSIS — R768 Other specified abnormal immunological findings in serum: Secondary | ICD-10-CM | POA: Diagnosis not present

## 2017-07-10 NOTE — Addendum Note (Signed)
Addendum  created 07/10/17 1229 by Albertha Ghee, MD   Sign clinical note

## 2017-08-25 ENCOUNTER — Ambulatory Visit (HOSPITAL_COMMUNITY)
Admission: EM | Admit: 2017-08-25 | Discharge: 2017-08-25 | Disposition: A | Discharge status: Home / Self Care | Payer: Medicare HMO | Attending: Internal Medicine | Admitting: Internal Medicine

## 2017-08-25 ENCOUNTER — Encounter (HOSPITAL_COMMUNITY): Payer: Self-pay | Admitting: *Deleted

## 2017-08-25 DIAGNOSIS — J0140 Acute pansinusitis, unspecified: Secondary | ICD-10-CM | POA: Diagnosis not present

## 2017-08-25 MED ORDER — BENZONATATE 100 MG PO CAPS: 100.0000 mg | ORAL_CAPSULE | Freq: Three times a day (TID) | ORAL | 0 refills | Status: DC

## 2017-08-25 MED ORDER — AMOXICILLIN-POT CLAVULANATE 875-125 MG PO TABS: 1.0000 | ORAL_TABLET | Freq: Two times a day (BID) | ORAL | 0 refills | Status: DC

## 2017-08-25 MED ORDER — CETIRIZINE HCL 5 MG PO TABS: 5.0000 mg | ORAL_TABLET | Freq: Every day | ORAL | 0 refills | Status: DC

## 2017-08-25 NOTE — ED Triage Notes (Signed)
C/O allergy sxs that have escalated over past 2 wks.  C/O harsh cough unresponsive to Delsym.  Now also having ear and head pressure and low-grade fevers.

## 2017-08-25 NOTE — ED Provider Notes (Signed)
Courtney Crane    CSN: [id number] Arrival date & time: 08/25/17  1211     History   Chief Complaint Chief Complaint  Patient presents with  . Cough  . Nasal Congestion    HPI Courtney Crane is a 66 y.o. female.   66 year old female with history of asthma, HLD, GERD, HTN, seasonal allergies comes in for nasal congestion, chest congestion, low grade fever. She has had 2 week history of PND, sneezing, eye itching, dry cough that is consistent with her allergy symptoms. Patient on azelastine nasal spray of allergy symptoms. States that she used to be on oral antihistamime, but would elevate blood pressure so she discontinued. Yesterday started with ear pressure, chest congestion, eye soreness, low grade fever of 100.1. Coughing has worsened significantly that causes chest soreness. Has been taking Delsym with minimal relief. Denies shortness of breath, wheezing. Have not needed inhaler use. No sick contact that she knows of. Has not gotten flu shot this year.       Past Medical History:  Diagnosis Date  . Anemia   . Anxiety   . Arthritis   . Asthma   . Colon polyps   . Complication of anesthesia    "sometimes I dont like to wake up after " ie difficult to awaken   . Depression   . Diverticulitis   . Diverticulosis   . GERD (gastroesophageal reflux disease)   . HLD (hyperlipidemia)   . HTN (hypertension)   . IBS (irritable bowel syndrome)   . IBS (irritable bowel syndrome)   . Insomnia   . Osteoarthritis   . PONV (postoperative nausea and vomiting)   . Seasonal allergies   . Shingles   . Status post dilation of esophageal narrowing   . Vitamin D deficiency     Patient Active Problem List   Diagnosis Date Noted  . 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 History of shingles 06/06/2016  . Genital herpes simplex 06/06/2016  . Seasonal allergies 06/06/2016  . Seborrheic dermatitis   . Prediabetes   . Anemia   . GERD (gastroesophageal  reflux disease)   . Vitamin D deficiency   . Insomnia, unspecified   . HTN (hypertension)     Past Surgical History:  Procedure Laterality Date  . APPENDECTOMY  1975  . bladder tack  1990  . CHOLECYSTECTOMY N/A 01/31/2017   Procedure: LAPAROSCOPIC CHOLECYSTECTOMY WITH INTRAOPERATIVE CHOLANGIOGRAM;  Surgeon: Armandina Gemma, MD;  Location: [facility];  Service: General;  Laterality: N/A;  . MENISCUS REPAIR Right 2013  . ROTATOR CUFF REPAIR Right 2005  . TONSILLECTOMY     as a child  . TOTAL ABDOMINAL HYSTERECTOMY  1988  . TOTAL KNEE ARTHROPLASTY Left   . WRIST RECONSTRUCTION Right 2008    OB History    No data available       Home Medications    Prior to Admission medications   Medication Sig Start Date End Date Taking? Authorizing Provider  amLODipine (NORVASC) 10 MG tablet Take 10 mg by mouth daily.    Yes [provider]  aspirin 81 MG tablet Take 81 mg by mouth daily.   Yes [provider]  azelastine (ASTELIN) 0.1 % nasal spray Place into both nostrils 2 (two) times daily. Use in each nostril as directed   Yes [provider]  Cholecalciferol (VITAMIN D3) 10000 units TABS Take 1 tablet by mouth once a week.   Yes [provider]  diclofenac (VOLTAREN) 75 MG  EC tablet Take 75 mg by mouth 2 (two) times daily.   Yes [provider]  hydrochlorothiazide (HYDRODIURIL) 25 MG tablet Take 25 mg by mouth daily.   Yes [provider]  hydroxychloroquine (PLAQUENIL) 200 MG tablet Take by mouth daily.   Yes [provider]  losartan (COZAAR) 100 MG tablet Take 100 mg by mouth daily.   Yes [provider]  MELATONIN PO Take 1 tablet by mouth at bedtime as needed.    Yes [provider]  Multiple Vitamin (MULTIVITAMIN) tablet Take 1 tablet by mouth daily.   Yes [provider]  Omega-3 Fatty Acids (FISH OIL) 1000 MG CAPS Take 1 capsule by mouth 2 (two) times daily.   Yes [provider]    ranitidine (ZANTAC) 150 MG tablet Take 150 mg by mouth at bedtime.    Yes [provider]  rosuvastatin (CRESTOR) 5 MG tablet Take 5 mg by mouth every Monday, Wednesday, and Friday.    Yes [provider]  acetaminophen (TYLENOL) 325 MG tablet Take 650 mg by mouth every 6 (six) hours as needed for mild pain or moderate pain.     [provider]  amoxicillin-clavulanate (AUGMENTIN) 875-125 MG tablet Take 1 tablet by mouth every 12 (twelve) hours. 08/25/17   Tasia Catchings, Jakki Doughty V, PA-C  benzonatate (TESSALON) 100 MG capsule Take 1 capsule (100 mg total) by mouth every 8 (eight) hours. 08/25/17   Tasia Catchings, Kateline Kinkade V, PA-C  cetirizine (ZYRTEC) 5 MG tablet Take 1 tablet (5 mg total) by mouth daily. 08/25/17   Tasia Catchings, Araina Butrick V, PA-C  ibuprofen (ADVIL,MOTRIN) 200 MG tablet Take 200 mg by mouth every 6 (six) hours as needed.    [provider]  Mometasone Furoate [facility] HFA) 100 MCG/ACT AERO Inhale 2 puffs into the lungs daily as needed.    [provider]  Mometasone Furoate [facility] HFA) 200 MCG/ACT AERO Inhale 2 puffs into the lungs daily as needed.    [provider]  valACYclovir (VALTREX) 500 MG tablet Take 1 tablet twice a day for 5 days at the start of an outbreak. Patient taking differently: Take 500 mg by mouth 2 (two) times daily as needed. Take 1 tablet twice a day for 5 days at the start of an outbreak. 09/28/16   Michel Bickers, MD    Family History Family History  Problem Relation Age of Onset  . Colon polyps Mother   . Diabetes Mother   . Heart disease Mother   . Kidney cancer Mother   . Arthritis Mother   . Hypertension Mother   . Hyperlipidemia Mother   . Colon polyps Son   . CAD Brother     Social History Social History  Substance Use Topics  . Smoking status: Never Smoker  . Smokeless tobacco: Never Used  . Alcohol use 4.2 oz/week    7 Standard drinks or equivalent per week     Allergies   Demerol [meperidine] and Tape   Review of  Systems Review of Systems  Reason unable to perform ROS: See HPI as above.     Physical Exam Triage Vital Signs ED Triage Vitals  Enc Vitals Group     BP 08/25/17 1224 (!) 161/78     Pulse Rate 08/25/17 1224 82     Resp 08/25/17 1224 14     Temp 08/25/17 1224 98.4 F (36.9 C)     Temp Source 08/25/17 1224 Oral     SpO2 08/25/17 1224 98 %  Weight --      Height --      Head Circumference --      Peak Flow --      Pain Score 08/25/17 1226 6     Pain Loc --      Pain Edu? --      Excl. in [place]? --    No data found.   Updated Vital Signs BP (!) 161/78   Pulse 82   Temp 98.4 F (36.9 C) (Oral)   Resp 14   SpO2 98%   Physical Exam  Constitutional: She is oriented to person, place, and time. She appears well-developed and well-nourished. No distress.  HENT:  Head: Normocephalic and atraumatic.  Right Ear: Tympanic membrane, external ear and ear canal normal. Tympanic membrane is not erythematous and not bulging.  Left Ear: Tympanic membrane, external ear and ear canal normal. Tympanic membrane is not erythematous and not bulging.  Nose: Mucosal edema present. Right sinus exhibits maxillary sinus tenderness and frontal sinus tenderness. Left sinus exhibits maxillary sinus tenderness and frontal sinus tenderness.  Mouth/Throat: Uvula is midline, oropharynx is clear and moist and mucous membranes are normal.  Eyes: Pupils are equal, round, and reactive to light. Conjunctivae and EOM are normal.  Neck: Normal range of motion. Neck supple.  Cardiovascular: Normal rate, regular rhythm and normal heart sounds.  Exam reveals no gallop and no friction rub.   No murmur heard. Pulmonary/Chest: Effort normal and breath sounds normal. She has no decreased breath sounds. She has no wheezes. She has no rhonchi. She has no rales.  Lymphadenopathy:    She has no cervical adenopathy.  Neurological: She is alert and oriented to person, place, and time.  Skin: Skin is warm and dry.   Psychiatric: She has a normal mood and affect. Her behavior is normal. Judgment normal.     UC Treatments / Results  Labs (all labs ordered are listed, but only abnormal results are displayed) Labs Reviewed - No data to display  EKG  EKG Interpretation None       Radiology No results found.  Procedures Procedures (including critical care time)  Medications Ordered in UC Medications - No data to display   Initial Impression / Assessment and Plan / UC Course  I have reviewed the triage vital signs and the nursing notes.  Pertinent labs & imaging results that were available during my care of the patient were reviewed by me and considered in my medical decision making (see chart for details).    Lung exam clear to auscultation bilaterally without adventitious lung sounds, O2 sat 98% at room air, low suspicion for pneumonia right now. Start Augmentin for sinusitis. Zyrtec for nasal congestion. Tessalon for cough. Push fluids. Return precautions given.   Final Clinical Impressions(s) / UC Diagnoses   Final diagnoses:  Acute non-recurrent pansinusitis    New Prescriptions Discharge Medication List as of 08/25/2017  1:05 PM    START taking these medications   Details  amoxicillin-clavulanate (AUGMENTIN) 875-125 MG tablet Take 1 tablet by mouth every 12 (twelve) hours., Starting Sun 08/25/2017, Normal    benzonatate (TESSALON) 100 MG capsule Take 1 capsule (100 mg total) by mouth every 8 (eight) hours., Starting Sun 08/25/2017, Normal    cetirizine (ZYRTEC) 5 MG tablet Take 1 tablet (5 mg total) by mouth daily., Starting Sun 08/25/2017, Normal          Tasia Catchings, Asianae Minkler V, PA-C 08/25/17 1310

## 2017-08-25 NOTE — Discharge Instructions (Signed)
Start Augmentin as directed. Start zyrtec for nasal congestion. Tessalon for cough. You can use over the counter nasal saline rinse such as neti pot for nasal congestion. Keep hydrated, your urine should be clear to pale yellow in color. Tylenol/motrin for fever and pain. Monitor for any worsening of symptoms, chest pain, shortness of breath, wheezing, swelling of the throat, follow up for reevaluation.

## 2017-09-05 DIAGNOSIS — Z6829 Body mass index (BMI) 29.0-29.9, adult: Secondary | ICD-10-CM | POA: Diagnosis not present

## 2017-09-05 DIAGNOSIS — M2550 Pain in unspecified joint: Secondary | ICD-10-CM | POA: Diagnosis not present

## 2017-09-05 DIAGNOSIS — E663 Overweight: Secondary | ICD-10-CM | POA: Diagnosis not present

## 2017-09-05 DIAGNOSIS — M150 Primary generalized (osteo)arthritis: Secondary | ICD-10-CM | POA: Diagnosis not present

## 2017-09-05 DIAGNOSIS — M064 Inflammatory polyarthropathy: Secondary | ICD-10-CM | POA: Diagnosis not present

## 2017-09-05 DIAGNOSIS — Z79899 Other long term (current) drug therapy: Secondary | ICD-10-CM | POA: Diagnosis not present

## 2017-09-06 DIAGNOSIS — L565 Disseminated superficial actinic porokeratosis (DSAP): Secondary | ICD-10-CM | POA: Diagnosis not present

## 2017-09-06 DIAGNOSIS — L821 Other seborrheic keratosis: Secondary | ICD-10-CM | POA: Diagnosis not present

## 2017-09-06 DIAGNOSIS — L820 Inflamed seborrheic keratosis: Secondary | ICD-10-CM | POA: Diagnosis not present

## 2017-09-06 DIAGNOSIS — D485 Neoplasm of uncertain behavior of skin: Secondary | ICD-10-CM | POA: Diagnosis not present

## 2017-09-06 DIAGNOSIS — L812 Freckles: Secondary | ICD-10-CM | POA: Diagnosis not present

## 2017-09-17 DIAGNOSIS — R05 Cough: Secondary | ICD-10-CM | POA: Diagnosis not present

## 2017-09-17 DIAGNOSIS — Z6828 Body mass index (BMI) 28.0-28.9, adult: Secondary | ICD-10-CM | POA: Diagnosis not present

## 2017-09-17 DIAGNOSIS — J209 Acute bronchitis, unspecified: Secondary | ICD-10-CM | POA: Diagnosis not present

## 2017-09-24 DIAGNOSIS — R1032 Left lower quadrant pain: Secondary | ICD-10-CM | POA: Diagnosis not present

## 2017-09-24 DIAGNOSIS — K760 Fatty (change of) liver, not elsewhere classified: Secondary | ICD-10-CM | POA: Diagnosis not present

## 2017-09-24 DIAGNOSIS — Z96652 Presence of left artificial knee joint: Secondary | ICD-10-CM | POA: Diagnosis not present

## 2017-09-24 DIAGNOSIS — Z885 Allergy status to narcotic agent status: Secondary | ICD-10-CM | POA: Diagnosis not present

## 2017-09-24 DIAGNOSIS — I10 Essential (primary) hypertension: Secondary | ICD-10-CM | POA: Diagnosis not present

## 2017-09-24 DIAGNOSIS — J45909 Unspecified asthma, uncomplicated: Secondary | ICD-10-CM | POA: Diagnosis not present

## 2017-09-24 DIAGNOSIS — R110 Nausea: Secondary | ICD-10-CM | POA: Diagnosis not present

## 2017-09-24 DIAGNOSIS — K5732 Diverticulitis of large intestine without perforation or abscess without bleeding: Secondary | ICD-10-CM | POA: Diagnosis not present

## 2017-09-24 DIAGNOSIS — K5730 Diverticulosis of large intestine without perforation or abscess without bleeding: Secondary | ICD-10-CM | POA: Diagnosis not present

## 2017-09-24 DIAGNOSIS — R160 Hepatomegaly, not elsewhere classified: Secondary | ICD-10-CM | POA: Diagnosis not present

## 2017-10-01 ENCOUNTER — Other Ambulatory Visit (INDEPENDENT_AMBULATORY_CARE_PROVIDER_SITE_OTHER): Payer: Medicare HMO

## 2017-10-01 ENCOUNTER — Ambulatory Visit: Payer: Medicare HMO | Admitting: Gastroenterology

## 2017-10-01 ENCOUNTER — Encounter: Payer: Self-pay | Admitting: Gastroenterology

## 2017-10-01 VITALS — BP 124/78 | HR 72 | Ht 62.5 in | Wt 156.1 lb

## 2017-10-01 DIAGNOSIS — K5732 Diverticulitis of large intestine without perforation or abscess without bleeding: Secondary | ICD-10-CM

## 2017-10-01 LAB — CBC WITH DIFFERENTIAL/PLATELET
BASOS PCT: 1 % (ref 0.0–3.0)
Basophils Absolute: 0.1 10*3/uL (ref 0.0–0.1)
EOS ABS: 0.2 10*3/uL (ref 0.0–0.7)
Eosinophils Relative: 3 % (ref 0.0–5.0)
HCT: 43.2 % (ref 36.0–46.0)
HEMOGLOBIN: 14.4 g/dL (ref 12.0–15.0)
Lymphocytes Relative: 38.5 % (ref 12.0–46.0)
Lymphs Abs: 3 10*3/uL (ref 0.7–4.0)
MCHC: 33.3 g/dL (ref 30.0–36.0)
MCV: 97.9 fl (ref 78.0–100.0)
MONO ABS: 1 10*3/uL (ref 0.1–1.0)
Monocytes Relative: 13.4 % — ABNORMAL HIGH (ref 3.0–12.0)
NEUTROS ABS: 3.4 10*3/uL (ref 1.4–7.7)
NEUTROS PCT: 44.1 % (ref 43.0–77.0)
PLATELETS: 270 10*3/uL (ref 150.0–400.0)
RBC: 4.42 Mil/uL (ref 3.87–5.11)
RDW: 13.4 % (ref 11.5–15.5)
WBC: 7.8 10*3/uL (ref 4.0–10.5)

## 2017-10-01 LAB — COMPREHENSIVE METABOLIC PANEL
ALBUMIN: 4.1 g/dL (ref 3.5–5.2)
ALT: 85 U/L — AB (ref 0–35)
AST: 46 U/L — AB (ref 0–37)
Alkaline Phosphatase: 56 U/L (ref 39–117)
BILIRUBIN TOTAL: 0.3 mg/dL (ref 0.2–1.2)
BUN: 18 mg/dL (ref 6–23)
CHLORIDE: 100 meq/L (ref 96–112)
CO2: 27 mEq/L (ref 19–32)
CREATININE: 1.03 mg/dL (ref 0.40–1.20)
Calcium: 9.3 mg/dL (ref 8.4–10.5)
GFR: 56.89 mL/min — ABNORMAL LOW (ref >60.00)
Glucose, Bld: 116 mg/dL — ABNORMAL HIGH (ref 70–99)
Potassium: 3.2 mEq/L — ABNORMAL LOW (ref 3.5–5.1)
SODIUM: 137 meq/L (ref 135–145)
TOTAL PROTEIN: 8 g/dL (ref 6.0–8.3)

## 2017-10-01 MED ORDER — AMOXICILLIN-POT CLAVULANATE 875-125 MG PO TABS: 1.0000 | ORAL_TABLET | Freq: Two times a day (BID) | ORAL | 0 refills | Status: AC

## 2017-10-01 MED ORDER — ONDANSETRON HCL 4 MG PO TABS: 4.0000 mg | ORAL_TABLET | Freq: Two times a day (BID) | ORAL | 0 refills | Status: DC

## 2017-10-01 NOTE — Patient Instructions (Addendum)
You will have labs checked today in the basement lab.  Please head down after you check out with the front desk  (cbc, cmet)  Zofran 4mg  pill, take one pill twice daily for 2 weeks (disp 40, no refills).   Augmentin 875/125, one pill twice daily, disp 2 weeks, no refills.  Call office on Friday to report on your response.  [facility] Dr. Harlow Asa appt to consider elective segmental colectomy in next few months for recurrent diverticulitis.  Normal BMI (Body Mass Index- based on height and weight) is between 23 and 30. Your BMI today is Body mass index is 28.1 kg/m. Marland Kitchen Please consider follow up  regarding your BMI with your Primary Care Provider.

## 2017-10-01 NOTE — Progress Notes (Signed)
Review of pertinent gastrointestinal problems: 1. Recurrent acute diverticulitis: established care Dr. Ardis Crane 2017; previously at least 3 attacks of diverticulitis. The pain is usually LLQ, lasting 5-10 days, cipro/flagyl, has had CT proof in past. Most recent attack 2015 while moving to [place]. Put on antibiotics and her symptoms resolved. Usually gets 'low grade fevers.'  She has had 4-5 attacks of pain.  Never hospitalized. 2. Routine risk for colon cancer: Colonoscopy 01/2016 Dr. Ardis Crane: pan-diverticulosis, no polyps. Recall at 10 years recommended. 3. Symptomatic gallstones: s/p lap chole 01/2017, normal IOC   HPI: This is a very pleasant 66 year old woman whom I last saw several months ago when she was having epigastric pains.  Turned out to be symptomatic gallstones and she underwent uneventful laparoscopic cholecystectomy with Dr. Armandina Crane.  Chief complaint is recurrent diverticulitis  Was having LLQ pains, they progressed, low grade fevers  While traveling in [place] and she went to a local emergency room there.  Labs 09/2017: WBC 16K; ast 77, alt 163, t bili 1.1 CT scan (outside) renal study 09/2017: Diverticulosis is seen throughout the descending and sigmoid colon with focal acute diverticulitis seen within the proximal sigmoid colon from images 114 through 147 with mucosal thickening and extensive pericolonic inflammatory changes. Small amount of fluid is seen tracking along the anterolateral pelvis. No focal abscess. No free air.  She was put on Cipro, Flagyl which has usually helped her in the past.  She completed a 7-day course and is still in quite a lot of pain although she is better than at outset.  She is still having some nausea.  ROS: complete GI ROS as described in HPI, all other review negative.  Constitutional:  No unintentional weight loss   Past Medical History:  Diagnosis Date  . Anemia   . Anxiety   . Arthritis   . Asthma   . Colon polyps   .  Complication of anesthesia    "sometimes I dont like to wake up after " ie difficult to awaken   . Depression   . Diverticulitis   . Diverticulosis   . GERD (gastroesophageal reflux disease)   . HLD (hyperlipidemia)   . HTN (hypertension)   . IBS (irritable bowel syndrome)   . IBS (irritable bowel syndrome)   . Insomnia   . Osteoarthritis   . PONV (postoperative nausea and vomiting)   . Rheumatoid arthritis ([place])   . Seasonal allergies   . Shingles   . Status post dilation of esophageal narrowing   . Vitamin D deficiency     Past Surgical History:  Procedure Laterality Date  . APPENDECTOMY  1975  . bladder tack  1990  . MENISCUS REPAIR Right 2013  . ROTATOR CUFF REPAIR Right 2005  . TONSILLECTOMY     as a child  . TOTAL ABDOMINAL HYSTERECTOMY  1988  . TOTAL KNEE ARTHROPLASTY Left   . WRIST RECONSTRUCTION Right 2008    Current Outpatient Medications  Medication Sig Dispense Refill  . acetaminophen (TYLENOL) 325 MG tablet Take 650 mg by mouth every 6 (six) hours as needed for mild pain or moderate pain.     Marland Kitchen amLODipine (NORVASC) 10 MG tablet Take 10 mg by mouth daily.     Marland Kitchen aspirin 81 MG tablet Take 81 mg by mouth daily.    Marland Kitchen azelastine (ASTELIN) 0.1 % nasal spray Place into both nostrils 2 (two) times daily. Use in each nostril as directed    . Cholecalciferol (VITAMIN D3) 10000 units  TABS Take 1 tablet by mouth once a week.    . diclofenac (VOLTAREN) 75 MG EC tablet Take 75 mg by mouth 2 (two) times daily.    . hydrochlorothiazide (HYDRODIURIL) 25 MG tablet Take 25 mg by mouth daily.    . hydroxychloroquine (PLAQUENIL) 200 MG tablet Take by mouth daily.    Marland Kitchen ibuprofen (ADVIL,MOTRIN) 200 MG tablet Take 200 mg by mouth every 6 (six) hours as needed.    Marland Kitchen losartan (COZAAR) 100 MG tablet Take 100 mg by mouth daily.    Marland Kitchen MELATONIN PO Take 1 tablet by mouth at bedtime as needed.     . Mometasone Furoate (ASMANEX HFA) 100 MCG/ACT AERO Inhale 2 puffs into the lungs daily as  needed.    . Mometasone Furoate (ASMANEX HFA) 200 MCG/ACT AERO Inhale 2 puffs into the lungs daily as needed.    . Multiple Vitamin (MULTIVITAMIN) tablet Take 1 tablet by mouth daily.    . Omega-3 Fatty Acids (FISH OIL) 1000 MG CAPS Take 1 capsule by mouth 2 (two) times daily.    Marland Kitchen omeprazole (PRILOSEC) 20 MG capsule Take 20 mg daily by mouth.    . valACYclovir (VALTREX) 500 MG tablet Take 1 tablet twice a day for 5 days at the start of an outbreak. (Patient taking differently: Take 500 mg by mouth 2 (two) times daily as needed. Take 1 tablet twice a day for 5 days at the start of an outbreak.) 10 tablet 11   No current facility-administered medications for this visit.     Allergies as of 10/01/2017 - Review Complete 10/01/2017  Allergen Reaction Noted  . Demerol [meperidine] Nausea Only 01/16/2016  . Tape Rash     Family History  Problem Relation Age of Onset  . Colon polyps Mother   . Diabetes Mother   . Heart disease Mother   . Kidney cancer Mother   . Arthritis Mother   . Hypertension Mother   . Hyperlipidemia Mother   . Colon polyps Son   . CAD Brother     Social History   Socioeconomic History  . Marital status: Married    Spouse name: Not on file  . Number of children: 2  . Years of education: Not on file  . Highest education level: Not on file  Social Needs  . Financial resource strain: Not on file  . Food insecurity - worry: Not on file  . Food insecurity - inability: Not on file  . Transportation needs - medical: Not on file  . Transportation needs - non-medical: Not on file  Occupational History  . Occupation: retired  Tobacco Use  . Smoking status: Never Smoker  . Smokeless tobacco: Never Used  Substance and Sexual Activity  . Alcohol use: Yes    Alcohol/week: 4.2 oz    Types: 7 Standard drinks or equivalent per week  . Drug use: No  . Sexual activity: Not on file  Other Topics Concern  . Not on file  Social History Narrative  . Not on file      Physical Exam: BP 124/78   Pulse 72   Ht 5' 2.5" (1.588 m)   Wt 156 lb 2 oz (70.8 kg)   BMI 28.10 kg/m  Constitutional: generally well-appearing Psychiatric: alert and oriented x3 Abdomen: soft, mild to moderately tender left lower quadrant, nondistended, no obvious ascites, no peritoneal signs, normal bowel sounds No peripheral edema noted in lower extremities  Assessment and plan: 66 y.o. female with acute diverticulitis,  recurrent diverticulitis  Outside imaging study CAT scan confirms left-sided diverticulitis.  This is her fifth episode in 8 or 10 years.  She completed Cipro Flagyl 7-day course but although  her pain is better, sheis still pretty uncomfortable in her left lower quadrant.  She is usually better after 1 week course of the Cipro Flagyl.  I think she has smoldering left-sided diverticulitis.  Possibly underlying abscess developing.  She does not appear toxic to me.  Going to put her on another course of antibiotics, switching to Augmentin this time.  I am giving her a 2-week course.  Also giving her scheduled Zofran prescription.  She will call later on this week to let me know how she is doing and if she has not noticed significant improvement in the next 3 or 4 days I would likely arrange for CT scan abdomen and pelvis with IV and oral contrast to check for abscess development.  I am also arranging for her to meet with her general surgeon Dr. Harlow Asa who helped her with gallbladder problems earlier this year.  I will ask that he consider elective segmental colectomy following  resolution of this bout of diverticulitis.  Please see the "Patient Instructions" section for addition details about the plan.  Courtney Loffler, MD [facility] 10/01/2017, 3:53 PM

## 2017-10-03 ENCOUNTER — Other Ambulatory Visit: Payer: Self-pay

## 2017-10-03 DIAGNOSIS — E876 Hypokalemia: Secondary | ICD-10-CM

## 2017-10-04 ENCOUNTER — Other Ambulatory Visit (INDEPENDENT_AMBULATORY_CARE_PROVIDER_SITE_OTHER): Payer: Medicare HMO

## 2017-10-04 DIAGNOSIS — E876 Hypokalemia: Secondary | ICD-10-CM | POA: Diagnosis not present

## 2017-10-04 LAB — BASIC METABOLIC PANEL
BUN: 19 mg/dL (ref 6–23)
CHLORIDE: 100 meq/L (ref 96–112)
CO2: 27 meq/L (ref 19–32)
Calcium: 9.6 mg/dL (ref 8.4–10.5)
Creatinine, Ser: 1.14 mg/dL (ref 0.40–1.20)
GFR: 50.61 mL/min — ABNORMAL LOW (ref >60.00)
Glucose, Bld: 113 mg/dL — ABNORMAL HIGH (ref 70–99)
POTASSIUM: 3.5 meq/L (ref 3.5–5.1)
Sodium: 138 mEq/L (ref 135–145)

## 2017-10-15 ENCOUNTER — Telehealth: Payer: Self-pay | Admitting: Gastroenterology

## 2017-10-15 NOTE — Telephone Encounter (Signed)
Noted  

## 2017-10-16 NOTE — Telephone Encounter (Signed)
Great to hear   thanks

## 2017-10-30 DIAGNOSIS — R69 Illness, unspecified: Secondary | ICD-10-CM | POA: Diagnosis not present

## 2017-11-01 DIAGNOSIS — K5730 Diverticulosis of large intestine without perforation or abscess without bleeding: Secondary | ICD-10-CM | POA: Diagnosis not present

## 2017-11-08 ENCOUNTER — Other Ambulatory Visit: Payer: Self-pay | Admitting: Surgery

## 2017-11-08 DIAGNOSIS — K5730 Diverticulosis of large intestine without perforation or abscess without bleeding: Secondary | ICD-10-CM

## 2017-11-15 ENCOUNTER — Ambulatory Visit
Admission: RE | Admit: 2017-11-15 | Discharge: 2017-11-15 | Disposition: A | Discharge status: Home / Self Care | Payer: Medicare HMO | Source: Ambulatory Visit | Attending: Surgery | Admitting: Surgery

## 2017-11-15 DIAGNOSIS — K5730 Diverticulosis of large intestine without perforation or abscess without bleeding: Secondary | ICD-10-CM

## 2017-11-15 DIAGNOSIS — K5792 Diverticulitis of intestine, part unspecified, without perforation or abscess without bleeding: Secondary | ICD-10-CM | POA: Diagnosis not present

## 2017-11-23 ENCOUNTER — Ambulatory Visit (HOSPITAL_COMMUNITY)
Admission: EM | Admit: 2017-11-23 | Discharge: 2017-11-23 | Disposition: A | Discharge status: Home / Self Care | Payer: Medicare HMO | Attending: Radiology | Admitting: Radiology

## 2017-11-23 ENCOUNTER — Encounter (HOSPITAL_COMMUNITY): Payer: Self-pay | Admitting: Emergency Medicine

## 2017-11-23 ENCOUNTER — Other Ambulatory Visit: Payer: Self-pay

## 2017-11-23 DIAGNOSIS — L03116 Cellulitis of left lower limb: Secondary | ICD-10-CM | POA: Diagnosis not present

## 2017-11-23 MED ORDER — CEFTRIAXONE SODIUM 1 G IJ SOLR
INTRAMUSCULAR | Status: AC
  Filled 2017-11-23: qty 10

## 2017-11-23 MED ORDER — SULFAMETHOXAZOLE-TRIMETHOPRIM 800-160 MG PO TABS: 1.0000 | ORAL_TABLET | Freq: Two times a day (BID) | ORAL | 0 refills | Status: DC

## 2017-11-23 MED ORDER — CEPHALEXIN 500 MG PO CAPS: 500.0000 mg | ORAL_CAPSULE | Freq: Four times a day (QID) | ORAL | 0 refills | Status: DC

## 2017-11-23 MED ORDER — CEFAZOLIN SODIUM 1 G IJ SOLR: 1.0000 g | Freq: Once | INTRAMUSCULAR | Status: DC

## 2017-11-23 MED ORDER — LIDOCAINE HCL (PF) 1 % IJ SOLN
INTRAMUSCULAR | Status: AC
  Filled 2017-11-23: qty 2

## 2017-11-23 MED ORDER — CEFTRIAXONE SODIUM 250 MG IJ SOLR
1000.0000 mg | Freq: Once | INTRAMUSCULAR | Status: AC
  Administered 2017-11-23: 1000 mg via INTRAMUSCULAR

## 2017-11-23 NOTE — ED Triage Notes (Addendum)
Patient has left inner thigh pain.  No known injury.  Noticed Wednesday.  Pain/redness/swelling left buttocks, left labia.

## 2017-11-23 NOTE — ED Provider Notes (Signed)
[facility]    CSN: [id number] Arrival date & time: 11/23/17  1204     History   Chief Complaint Chief Complaint  Patient presents with  . Leg Pain    HPI Courtney Crane is a 67 y.o. female.   67 y.o. female presents with abscess to left inner thigh X 3 days. Condition is persistent and worsening in nature. Condition is made better by nothing. Condition is made worse by nothing. Patient denies any treatment prior to there arrival at this facility. Patient report fever last evening.        Past Medical History:  Diagnosis Date  . Anemia   . Anxiety   . Arthritis   . Asthma   . Colon polyps   . Complication of anesthesia    "sometimes I dont like to wake up after " ie difficult to awaken   . Depression   . Diverticulitis   . Diverticulosis   . GERD (gastroesophageal reflux disease)   . HLD (hyperlipidemia)   . HTN (hypertension)   . IBS (irritable bowel syndrome)   . IBS (irritable bowel syndrome)   . Insomnia   . Osteoarthritis   . PONV (postoperative nausea and vomiting)   . Rheumatoid arthritis ([place])   . Seasonal allergies   . Shingles   . Status post dilation of esophageal narrowing   . Vitamin D deficiency     Patient Active Problem List   Diagnosis Date Noted  . 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 History of shingles 06/06/2016  . Genital herpes simplex 06/06/2016  . Seasonal allergies 06/06/2016  . Seborrheic dermatitis   . Prediabetes   . Anemia   . GERD (gastroesophageal reflux disease)   . Vitamin D deficiency   . Insomnia, unspecified   . HTN (hypertension)     Past Surgical History:  Procedure Laterality Date  . APPENDECTOMY  1975  . bladder tack  1990  . CHOLECYSTECTOMY N/A 01/31/2017   Procedure: LAPAROSCOPIC CHOLECYSTECTOMY WITH INTRAOPERATIVE CHOLANGIOGRAM;  Surgeon: Armandina Gemma, MD;  Location: [facility];  Service: General;  Laterality: N/A;  . MENISCUS REPAIR Right 2013  . ROTATOR CUFF REPAIR Right  2005  . TONSILLECTOMY     as a child  . TOTAL ABDOMINAL HYSTERECTOMY  1988  . TOTAL KNEE ARTHROPLASTY Left   . WRIST RECONSTRUCTION Right 2008    OB History    No data available       Home Medications    Prior to Admission medications   Medication Sig Start Date End Date Taking? Authorizing Provider  acetaminophen (TYLENOL) 325 MG tablet Take 650 mg by mouth every 6 (six) hours as needed for mild pain or moderate pain.     [provider]  amLODipine (NORVASC) 10 MG tablet Take 10 mg by mouth daily.     [provider]  aspirin 81 MG tablet Take 81 mg by mouth daily.    [provider]  azelastine (ASTELIN) 0.1 % nasal spray Place into both nostrils 2 (two) times daily. Use in each nostril as directed    [provider]  Cholecalciferol (VITAMIN D3) 10000 units TABS Take 1 tablet by mouth once a week.    [provider]  diclofenac (VOLTAREN) 75 MG EC tablet Take 75 mg by mouth 2 (two) times daily.    [provider]  hydrochlorothiazide (HYDRODIURIL) 25 MG tablet Take 25 mg by mouth daily.    [provider]  hydroxychloroquine (PLAQUENIL) 200  MG tablet Take by mouth daily.    [provider]  ibuprofen (ADVIL,MOTRIN) 200 MG tablet Take 200 mg by mouth every 6 (six) hours as needed.    [provider]  losartan (COZAAR) 100 MG tablet Take 100 mg by mouth daily.    [provider]  MELATONIN PO Take 1 tablet by mouth at bedtime as needed.     [provider]  Mometasone Furoate [facility] HFA) 100 MCG/ACT AERO Inhale 2 puffs into the lungs daily as needed.    [provider]  Mometasone Furoate [facility] HFA) 200 MCG/ACT AERO Inhale 2 puffs into the lungs daily as needed.    [provider]  Multiple Vitamin (MULTIVITAMIN) tablet Take 1 tablet by mouth daily.    [provider]  Omega-3 Fatty Acids (FISH OIL) 1000 MG CAPS Take 1 capsule by mouth 2 (two) times  daily.    [provider]  omeprazole (PRILOSEC) 20 MG capsule Take 20 mg daily by mouth.    [provider]  ondansetron (ZOFRAN) 4 MG tablet Take 1 tablet (4 mg total) 2 (two) times daily by mouth. For 2 weeks 10/01/17   Milus Banister, MD  valACYclovir (VALTREX) 500 MG tablet Take 1 tablet twice a day for 5 days at the start of an outbreak. Patient taking differently: Take 500 mg by mouth 2 (two) times daily as needed. Take 1 tablet twice a day for 5 days at the start of an outbreak. 09/28/16   Michel Bickers, MD    Family History Family History  Problem Relation Age of Onset  . Colon polyps Mother   . Diabetes Mother   . Heart disease Mother   . Kidney cancer Mother   . Arthritis Mother   . Hypertension Mother   . Hyperlipidemia Mother   . Colon polyps Son   . CAD Brother     Social History Social History   Tobacco Use  . Smoking status: Never Smoker  . Smokeless tobacco: Never Used  Substance Use Topics  . Alcohol use: Yes    Alcohol/week: 4.2 oz    Types: 7 Standard drinks or equivalent per week  . Drug use: No     Allergies   Demerol [meperidine] and Tape   Review of Systems Review of Systems  Constitutional: Positive for fever.  Skin:       [place] area ot inner left thigh,.pain with palpation      Physical Exam Triage Vital Signs ED Triage Vitals [11/23/17 1227]  Enc Vitals Group     BP 140/65     Pulse Rate 80     Resp 18     Temp 98.3 F (36.8 C)     Temp Source Oral     SpO2 96 %     Weight      Height      Head Circumference      Peak Flow      Pain Score 6     Pain Loc      Pain Edu?      Excl. in [place]?    No data found.  Updated Vital Signs BP 140/65 (BP Location: Left Arm)   Pulse 80   Temp 98.3 F (36.8 C) (Oral)   Resp 18   SpO2 96%   Visual Acuity Right Eye Distance:   Left Eye Distance:   Bilateral Distance:    Right Eye Near:   Left Eye Near:    Bilateral  Near:     Physical Exam    Constitutional: She is oriented to person, place, and time. She appears well-developed and well-nourished.  HENT:  Head: Normocephalic and atraumatic.  Eyes: Conjunctivae are normal.  Neck: Normal range of motion.  Pulmonary/Chest: Effort normal.  Musculoskeletal: Normal range of motion.  Neurological: She is alert and oriented to person, place, and time.  Skin:  erythremic area to inner left thigh. approx 12 X 10 in diameter. Area marked. Warm, non fluctuant.   Psychiatric: She has a normal mood and affect.  Nursing note and vitals reviewed.    UC Treatments / Results  Labs (all labs ordered are listed, but only abnormal results are displayed) Labs Reviewed - No data to display  EKG  EKG Interpretation None       Radiology No results found.  Procedures Procedures (including critical care time)  Medications Ordered in UC Medications  ceFAZolin (ANCEF) injection 1 g (not administered)     Initial Impression / Assessment and Plan / UC Course  I have reviewed the triage vital signs and the nursing notes.  Pertinent labs & imaging results that were available during my care of the patient were reviewed by me and considered in my medical decision making (see chart for details).       Final Clinical Impressions(s) / UC Diagnoses   Final diagnoses:  None    ED Discharge Orders    None       Controlled Substance Prescriptions [place] Controlled Substance Registry consulted? Not Applicable   Jacqualine Mau, NP 11/23/17 1250

## 2017-11-23 NOTE — Discharge Instructions (Signed)
Apply warm compresses to area. If reddens continues outside of marked area go to nearest emergency room for evaluation.

## 2017-11-28 ENCOUNTER — Other Ambulatory Visit: Payer: Self-pay

## 2017-11-28 ENCOUNTER — Emergency Department (HOSPITAL_COMMUNITY): Payer: Medicare HMO

## 2017-11-28 ENCOUNTER — Inpatient Hospital Stay (HOSPITAL_COMMUNITY)
Admission: EM | Admit: 2017-11-28 | Discharge: 2017-12-05 | DRG: 571 | Disposition: A | Discharge status: Home / Self Care | Payer: Medicare HMO | Attending: Internal Medicine | Admitting: Internal Medicine

## 2017-11-28 ENCOUNTER — Encounter (HOSPITAL_COMMUNITY): Payer: Self-pay | Admitting: Emergency Medicine

## 2017-11-28 DIAGNOSIS — L03314 Cellulitis of groin: Secondary | ICD-10-CM | POA: Diagnosis not present

## 2017-11-28 DIAGNOSIS — D72829 Elevated white blood cell count, unspecified: Secondary | ICD-10-CM | POA: Diagnosis not present

## 2017-11-28 DIAGNOSIS — Z6828 Body mass index (BMI) 28.0-28.9, adult: Secondary | ICD-10-CM | POA: Diagnosis not present

## 2017-11-28 DIAGNOSIS — K5732 Diverticulitis of large intestine without perforation or abscess without bleeding: Secondary | ICD-10-CM | POA: Diagnosis present

## 2017-11-28 DIAGNOSIS — Z8371 Family history of colonic polyps: Secondary | ICD-10-CM

## 2017-11-28 DIAGNOSIS — Z90710 Acquired absence of both cervix and uterus: Secondary | ICD-10-CM | POA: Diagnosis not present

## 2017-11-28 DIAGNOSIS — N179 Acute kidney failure, unspecified: Secondary | ICD-10-CM | POA: Diagnosis not present

## 2017-11-28 DIAGNOSIS — E559 Vitamin D deficiency, unspecified: Secondary | ICD-10-CM | POA: Diagnosis present

## 2017-11-28 DIAGNOSIS — Z9049 Acquired absence of other specified parts of digestive tract: Secondary | ICD-10-CM

## 2017-11-28 DIAGNOSIS — Z8051 Family history of malignant neoplasm of kidney: Secondary | ICD-10-CM

## 2017-11-28 DIAGNOSIS — L03315 Cellulitis of perineum: Secondary | ICD-10-CM | POA: Diagnosis not present

## 2017-11-28 DIAGNOSIS — K219 Gastro-esophageal reflux disease without esophagitis: Secondary | ICD-10-CM | POA: Diagnosis not present

## 2017-11-28 DIAGNOSIS — Z8261 Family history of arthritis: Secondary | ICD-10-CM

## 2017-11-28 DIAGNOSIS — L219 Seborrheic dermatitis, unspecified: Secondary | ICD-10-CM | POA: Diagnosis present

## 2017-11-28 DIAGNOSIS — Z8249 Family history of ischemic heart disease and other diseases of the circulatory system: Secondary | ICD-10-CM | POA: Diagnosis not present

## 2017-11-28 DIAGNOSIS — Z8349 Family history of other endocrine, nutritional and metabolic diseases: Secondary | ICD-10-CM | POA: Diagnosis not present

## 2017-11-28 DIAGNOSIS — L0390 Cellulitis, unspecified: Secondary | ICD-10-CM

## 2017-11-28 DIAGNOSIS — E860 Dehydration: Secondary | ICD-10-CM | POA: Diagnosis present

## 2017-11-28 DIAGNOSIS — Z1629 Resistance to other single specified antibiotic: Secondary | ICD-10-CM | POA: Diagnosis not present

## 2017-11-28 DIAGNOSIS — Z96652 Presence of left artificial knee joint: Secondary | ICD-10-CM | POA: Diagnosis present

## 2017-11-28 DIAGNOSIS — L0231 Cutaneous abscess of buttock: Secondary | ICD-10-CM | POA: Diagnosis present

## 2017-11-28 DIAGNOSIS — L0889 Other specified local infections of the skin and subcutaneous tissue: Secondary | ICD-10-CM | POA: Diagnosis not present

## 2017-11-28 DIAGNOSIS — L03317 Cellulitis of buttock: Secondary | ICD-10-CM

## 2017-11-28 DIAGNOSIS — K760 Fatty (change of) liver, not elsewhere classified: Secondary | ICD-10-CM | POA: Diagnosis present

## 2017-11-28 DIAGNOSIS — R945 Abnormal results of liver function studies: Secondary | ICD-10-CM | POA: Diagnosis not present

## 2017-11-28 DIAGNOSIS — M069 Rheumatoid arthritis, unspecified: Secondary | ICD-10-CM | POA: Diagnosis present

## 2017-11-28 DIAGNOSIS — Z833 Family history of diabetes mellitus: Secondary | ICD-10-CM | POA: Diagnosis not present

## 2017-11-28 DIAGNOSIS — L02215 Cutaneous abscess of perineum: Principal | ICD-10-CM | POA: Diagnosis present

## 2017-11-28 DIAGNOSIS — I10 Essential (primary) hypertension: Secondary | ICD-10-CM | POA: Diagnosis not present

## 2017-11-28 DIAGNOSIS — M1990 Unspecified osteoarthritis, unspecified site: Secondary | ICD-10-CM | POA: Diagnosis present

## 2017-11-28 DIAGNOSIS — Z91048 Other nonmedicinal substance allergy status: Secondary | ICD-10-CM

## 2017-11-28 DIAGNOSIS — B9562 Methicillin resistant Staphylococcus aureus infection as the cause of diseases classified elsewhere: Secondary | ICD-10-CM | POA: Diagnosis present

## 2017-11-28 DIAGNOSIS — L0291 Cutaneous abscess, unspecified: Secondary | ICD-10-CM | POA: Diagnosis not present

## 2017-11-28 DIAGNOSIS — Z79899 Other long term (current) drug therapy: Secondary | ICD-10-CM

## 2017-11-28 DIAGNOSIS — R7989 Other specified abnormal findings of blood chemistry: Secondary | ICD-10-CM

## 2017-11-28 DIAGNOSIS — R600 Localized edema: Secondary | ICD-10-CM | POA: Diagnosis not present

## 2017-11-28 DIAGNOSIS — Z885 Allergy status to narcotic agent status: Secondary | ICD-10-CM | POA: Diagnosis not present

## 2017-11-28 DIAGNOSIS — Z7982 Long term (current) use of aspirin: Secondary | ICD-10-CM

## 2017-11-28 DIAGNOSIS — R7303 Prediabetes: Secondary | ICD-10-CM | POA: Diagnosis not present

## 2017-11-28 DIAGNOSIS — Z86010 Personal history of colonic polyps: Secondary | ICD-10-CM

## 2017-11-28 DIAGNOSIS — D649 Anemia, unspecified: Secondary | ICD-10-CM | POA: Diagnosis present

## 2017-11-28 DIAGNOSIS — R748 Abnormal levels of other serum enzymes: Secondary | ICD-10-CM | POA: Diagnosis not present

## 2017-11-28 DIAGNOSIS — E785 Hyperlipidemia, unspecified: Secondary | ICD-10-CM | POA: Diagnosis not present

## 2017-11-28 DIAGNOSIS — G4700 Insomnia, unspecified: Secondary | ICD-10-CM | POA: Diagnosis present

## 2017-11-28 HISTORY — DX: Cutaneous abscess of buttock: L02.31

## 2017-11-28 LAB — CBC WITH DIFFERENTIAL/PLATELET
BASOS PCT: 1 %
Basophils Absolute: 0.2 10*3/uL — ABNORMAL HIGH (ref 0.0–0.1)
EOS ABS: 0.5 10*3/uL (ref 0.0–0.7)
Eosinophils Relative: 3 %
HCT: 37.2 % (ref 36.0–46.0)
Hemoglobin: 12.2 g/dL (ref 12.0–15.0)
LYMPHS ABS: 2.9 10*3/uL (ref 0.7–4.0)
Lymphocytes Relative: 18 %
MCH: 31.6 pg (ref 26.0–34.0)
MCHC: 32.8 g/dL (ref 30.0–36.0)
MCV: 96.4 fL (ref 78.0–100.0)
MONO ABS: 1.8 10*3/uL — AB (ref 0.1–1.0)
Monocytes Relative: 11 %
NEUTROS PCT: 67 %
Neutro Abs: 10.9 10*3/uL — ABNORMAL HIGH (ref 1.7–7.7)
PLATELETS: 219 10*3/uL (ref 150–400)
RBC: 3.86 MIL/uL — ABNORMAL LOW (ref 3.87–5.11)
RDW: 14.8 % (ref 11.5–15.5)
WBC: 16.3 10*3/uL — ABNORMAL HIGH (ref 4.0–10.5)

## 2017-11-28 LAB — URINALYSIS, ROUTINE W REFLEX MICROSCOPIC
Bilirubin Urine: NEGATIVE
Glucose, UA: NEGATIVE mg/dL
Hgb urine dipstick: NEGATIVE
Ketones, ur: NEGATIVE mg/dL
NITRITE: NEGATIVE
PROTEIN: 30 mg/dL — AB
Specific Gravity, Urine: 1.03 (ref 1.005–1.030)
pH: 5 (ref 5.0–8.0)

## 2017-11-28 LAB — I-STAT CHEM 8, ED
BUN: 24 mg/dL — ABNORMAL HIGH (ref 6–20)
CALCIUM ION: 1.22 mmol/L (ref 1.15–1.40)
CHLORIDE: 103 mmol/L (ref 101–111)
Creatinine, Ser: 1.5 mg/dL — ABNORMAL HIGH (ref 0.44–1.00)
Glucose, Bld: 95 mg/dL (ref 65–99)
HCT: 38 % (ref 36.0–46.0)
HEMOGLOBIN: 12.9 g/dL (ref 12.0–15.0)
Potassium: 4.7 mmol/L (ref 3.5–5.1)
Sodium: 138 mmol/L (ref 135–145)
TCO2: 23 mmol/L (ref 22–32)

## 2017-11-28 MED ORDER — MORPHINE SULFATE (PF) 4 MG/ML IV SOLN
4.0000 mg | Freq: Once | INTRAVENOUS | Status: AC
  Administered 2017-11-28: 4 mg via INTRAVENOUS
  Filled 2017-11-28: qty 1

## 2017-11-28 MED ORDER — MELATONIN 3 MG PO TABS
3.0000 mg | ORAL_TABLET | Freq: Every evening | ORAL | Status: DC | PRN
  Filled 2017-11-28: qty 1

## 2017-11-28 MED ORDER — ACETAMINOPHEN 650 MG RE SUPP: 650.0000 mg | Freq: Four times a day (QID) | RECTAL | Status: DC | PRN

## 2017-11-28 MED ORDER — SODIUM CHLORIDE 0.9 % IV BOLUS (SEPSIS)
1000.0000 mL | Freq: Once | INTRAVENOUS | Status: AC
  Administered 2017-11-28: 1000 mL via INTRAVENOUS

## 2017-11-28 MED ORDER — ENOXAPARIN SODIUM 40 MG/0.4ML ~~LOC~~ SOLN
40.0000 mg | SUBCUTANEOUS | Status: DC
  Administered 2017-11-28 – 2017-11-30 (×3): 40 mg via SUBCUTANEOUS
  Filled 2017-11-28 (×3): qty 0.4

## 2017-11-28 MED ORDER — ACETAMINOPHEN 325 MG PO TABS
650.0000 mg | ORAL_TABLET | Freq: Four times a day (QID) | ORAL | Status: DC | PRN
  Administered 2017-11-29 – 2017-11-30 (×2): 650 mg via ORAL
  Filled 2017-11-28 (×2): qty 2

## 2017-11-28 MED ORDER — ROSUVASTATIN CALCIUM 5 MG PO TABS
5.0000 mg | ORAL_TABLET | ORAL | Status: DC
  Administered 2017-11-29 – 2017-12-04 (×2): 5 mg via ORAL
  Filled 2017-11-28 (×3): qty 1

## 2017-11-28 MED ORDER — SODIUM CHLORIDE 0.9 % IV SOLN: 1250.0000 mg | INTRAVENOUS | Status: DC

## 2017-11-28 MED ORDER — VANCOMYCIN HCL 10 G IV SOLR
1500.0000 mg | Freq: Once | INTRAVENOUS | Status: DC
  Filled 2017-11-28: qty 1500

## 2017-11-28 MED ORDER — IOPAMIDOL (ISOVUE-300) INJECTION 61%
INTRAVENOUS | Status: AC
  Administered 2017-11-28: 80 mL
  Filled 2017-11-28: qty 100

## 2017-11-28 MED ORDER — LACTATED RINGERS IV SOLN
INTRAVENOUS | Status: DC
  Administered 2017-11-28: 23:00:00 via INTRAVENOUS

## 2017-11-28 MED ORDER — ASPIRIN 81 MG PO CHEW
81.0000 mg | CHEWABLE_TABLET | Freq: Every day | ORAL | Status: DC
  Administered 2017-11-29 – 2017-12-05 (×6): 81 mg via ORAL
  Filled 2017-11-28 (×7): qty 1

## 2017-11-28 MED ORDER — ONDANSETRON HCL 4 MG PO TABS: 4.0000 mg | ORAL_TABLET | Freq: Four times a day (QID) | ORAL | Status: DC | PRN

## 2017-11-28 MED ORDER — ONDANSETRON HCL 4 MG/2ML IJ SOLN
4.0000 mg | Freq: Once | INTRAMUSCULAR | Status: AC
  Administered 2017-11-28: 4 mg via INTRAVENOUS
  Filled 2017-11-28: qty 2

## 2017-11-28 MED ORDER — PANTOPRAZOLE SODIUM 40 MG PO TBEC
40.0000 mg | DELAYED_RELEASE_TABLET | Freq: Every day | ORAL | Status: DC
  Administered 2017-11-29 – 2017-12-05 (×6): 40 mg via ORAL
  Filled 2017-11-28 (×7): qty 1

## 2017-11-28 MED ORDER — DOCUSATE SODIUM 100 MG PO CAPS
100.0000 mg | ORAL_CAPSULE | Freq: Two times a day (BID) | ORAL | Status: DC
  Administered 2017-11-28 – 2017-12-05 (×13): 100 mg via ORAL
  Filled 2017-11-28 (×14): qty 1

## 2017-11-28 MED ORDER — ONDANSETRON HCL 4 MG/2ML IJ SOLN
4.0000 mg | Freq: Four times a day (QID) | INTRAMUSCULAR | Status: DC | PRN
  Administered 2017-12-02: 4 mg via INTRAVENOUS

## 2017-11-28 MED ORDER — MORPHINE SULFATE (PF) 4 MG/ML IV SOLN
2.0000 mg | INTRAVENOUS | Status: DC | PRN
  Administered 2017-11-28 – 2017-11-29 (×4): 2 mg via INTRAVENOUS
  Filled 2017-11-28 (×4): qty 1

## 2017-11-28 NOTE — ED Notes (Signed)
I&D tray set up at bedside.

## 2017-11-28 NOTE — ED Notes (Signed)
ED Provider at bedside. 

## 2017-11-28 NOTE — ED Notes (Signed)
Patient ambulated to restroom without difficulty.

## 2017-11-28 NOTE — ED Notes (Signed)
Pt back in room.

## 2017-11-28 NOTE — ED Provider Notes (Signed)
[facility] EMERGENCY DEPARTMENT Provider Note   CSN: [id number] Arrival date & time: 11/28/17  1416     History   Chief Complaint Chief Complaint  Patient presents with  . Abscess    HPI Courtney Crane is a 67 y.o. female.  HPI   Patient is a 67 year old female with a history of rheumatoid arthritis (on hydroxychloroquine since September), anemia, arthritis, depression, GERD, hyperlipidemia, and hypertension presenting for increasing swelling of the inner thigh, left labia, and left rectal region.  Patient reports that 6 days ago, she presented to urgent care after she noted redness around her left thigh, left labia, and spreading up towards her left rectum.  Urgent care gave 1 dose of IM Ancef, and she was discharged with outpatient prescriptions for Bactrim and Keflex.  Patient currently finished with Keflex prescription.  Patient notes that the erythema is reducing, however there is a thickened area just inferior to the labia majora on the left that is worsening.  Patient presented to her primary care provider today who was concerned for abscess and sent her to the emergency department for evaluation.  Patient reports that she was afebrile throughout the course, however she was chilled 6 days ago.  Patient denies any chills at present.  Patient denies nausea or vomiting.  Patient does take hydroxychloroquine for rheumatoid arthritis, but has no history of diabetes.  Past Medical History:  Diagnosis Date  . Anemia   . Anxiety   . Arthritis   . Asthma   . Colon polyps   . Complication of anesthesia    "sometimes I dont like to wake up after " ie difficult to awaken   . Depression   . Diverticulitis   . Diverticulosis   . GERD (gastroesophageal reflux disease)   . HLD (hyperlipidemia)   . HTN (hypertension)   . IBS (irritable bowel syndrome)   . IBS (irritable bowel syndrome)   . Insomnia   . Osteoarthritis   . PONV (postoperative nausea and vomiting)     . Rheumatoid arthritis ([place])   . Seasonal allergies   . Shingles   . Status post dilation of esophageal narrowing   . Vitamin D deficiency     Patient Active Problem List   Diagnosis Date Noted  . 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 History of shingles 06/06/2016  . Genital herpes simplex 06/06/2016  . Seasonal allergies 06/06/2016  . Seborrheic dermatitis   . Prediabetes   . Anemia   . GERD (gastroesophageal reflux disease)   . Vitamin D deficiency   . Insomnia, unspecified   . HTN (hypertension)     Past Surgical History:  Procedure Laterality Date  . APPENDECTOMY  1975  . bladder tack  1990  . CHOLECYSTECTOMY N/A 01/31/2017   Procedure: LAPAROSCOPIC CHOLECYSTECTOMY WITH INTRAOPERATIVE CHOLANGIOGRAM;  Surgeon: Armandina Gemma, MD;  Location: [facility];  Service: General;  Laterality: N/A;  . MENISCUS REPAIR Right 2013  . ROTATOR CUFF REPAIR Right 2005  . TONSILLECTOMY     as a child  . TOTAL ABDOMINAL HYSTERECTOMY  1988  . TOTAL KNEE ARTHROPLASTY Left   . WRIST RECONSTRUCTION Right 2008    OB History    No data available       Home Medications    Prior to Admission medications   Medication Sig Start Date End Date Taking? Authorizing Provider  acetaminophen (TYLENOL) 325 MG tablet Take 650 mg by mouth every 6 (six) hours as needed for mild pain or  moderate pain.    Yes [provider]  amLODipine (NORVASC) 10 MG tablet Take 10 mg by mouth daily.    Yes [provider]  aspirin 81 MG tablet Take 81 mg by mouth daily.   Yes [provider]  Cholecalciferol (VITAMIN D3) 10000 units TABS Take 1 tablet by mouth once a week.   Yes [provider]  diclofenac (VOLTAREN) 75 MG EC tablet Take 75 mg by mouth 2 (two) times daily.   Yes [provider]  hydrochlorothiazide (HYDRODIURIL) 25 MG tablet Take 25 mg by mouth daily.   Yes [provider]  hydroxychloroquine (PLAQUENIL) 200 MG tablet Take by mouth  daily.   Yes [provider]  ibuprofen (ADVIL,MOTRIN) 200 MG tablet Take 400 mg by mouth every 6 (six) hours as needed for moderate pain.    Yes [provider]  losartan (COZAAR) 100 MG tablet Take 100 mg by mouth daily.   Yes [provider]  MELATONIN PO Take 1 tablet by mouth at bedtime as needed.    Yes [provider]  Multiple Vitamin (MULTIVITAMIN) tablet Take 1 tablet by mouth daily.   Yes [provider]  Omega-3 Fatty Acids (FISH OIL) 1000 MG CAPS Take 1 capsule by mouth 2 (two) times daily.   Yes [provider]  omeprazole (PRILOSEC) 20 MG capsule Take 20 mg daily by mouth.   Yes [provider]  PSYLLIUM PO Take 1 packet by mouth daily.   Yes [provider]  rosuvastatin (CRESTOR) 5 MG tablet Take 5 mg by mouth 3 (three) times a week. 03/07/17  Yes [provider]  sulfamethoxazole-trimethoprim (BACTRIM DS,SEPTRA DS) 800-160 MG tablet Take 1 tablet by mouth 2 (two) times daily for 7 days. 11/23/17 11/30/17 Yes Jacqualine Mau, NP  cephALEXin (KEFLEX) 500 MG capsule Take 1 capsule (500 mg total) by mouth 4 (four) times daily. Patient not taking: Reported on 11/28/2017 11/23/17   Jacqualine Mau, NP  ondansetron (ZOFRAN) 4 MG tablet Take 1 tablet (4 mg total) 2 (two) times daily by mouth. For 2 weeks Patient not taking: Reported on 11/28/2017 10/01/17   Milus Banister, MD  valACYclovir (VALTREX) 500 MG tablet Take 1 tablet twice a day for 5 days at the start of an outbreak. Patient not taking: Reported on 11/28/2017 09/28/16   Michel Bickers, MD    Family History Family History  Problem Relation Age of Onset  . Colon polyps Mother   . Diabetes Mother   . Heart disease Mother   . Kidney cancer Mother   . Arthritis Mother   . Hypertension Mother   . Hyperlipidemia Mother   . Colon polyps Son   . CAD Brother     Social History Social History   Tobacco Use  . Smoking status: Never  Smoker  . Smokeless tobacco: Never Used  Substance Use Topics  . Alcohol use: Yes    Alcohol/week: 4.2 oz    Types: 7 Standard drinks or equivalent per week  . Drug use: No     Allergies   Demerol [meperidine] and Tape   Review of Systems Review of Systems  Constitutional: Negative for chills and fever.  Respiratory: Negative for shortness of breath.   Cardiovascular: Negative for chest pain.  Gastrointestinal: Negative for abdominal pain, nausea and vomiting.  Genitourinary: Positive for pelvic pain and vaginal pain.  Skin: Positive for color change.  Allergic/Immunologic: Positive for immunocompromised state.  All other systems reviewed  and are negative.    Physical Exam Updated Vital Signs BP (!) 120/45   Pulse 71   Temp 99.1 F (37.3 C) (Oral)   Resp 14   SpO2 100%   Physical Exam  Constitutional: She appears well-developed and well-nourished. No distress.  Sitting comfortably in bed.  HENT:  Head: Normocephalic and atraumatic.  Eyes: Conjunctivae are normal. Right eye exhibits no discharge. Left eye exhibits no discharge.  EOMs normal to gross examination.  Neck: Normal range of motion.  Cardiovascular: Normal rate and regular rhythm.  Intact, 2+ radial pulse.  Patient non-tachycardic on my examination.  Pulse is 72.  Pulmonary/Chest: Effort normal and breath sounds normal.  Normal respiratory effort. Patient converses comfortably. No audible wheeze or stridor.  Abdominal: She exhibits no distension.  Genitourinary:     Genitourinary Comments: Examination performed with Hassan Rowan, EMT chaperone present.  There is erythema extending from bilateral labia majora posteriorly towards the rectum.  There is an area of induration just inferior to the left labia majora.  There is induration and erythema leading up to but not including the anus.  Musculoskeletal: Normal range of motion.  Neurological: She is alert.  Cranial nerves intact to gross observation. Patient  moves extremities without difficulty.  Skin: Skin is warm and dry. She is not diaphoretic.  Psychiatric: She has a normal mood and affect. Her behavior is normal. Judgment and thought content normal.  Nursing note and vitals reviewed.    ED Treatments / Results  Labs (all labs ordered are listed, but only abnormal results are displayed) Labs Reviewed  CBC WITH DIFFERENTIAL/PLATELET - Abnormal; Notable for the following components:      Result Value   WBC 16.3 (*)    RBC 3.86 (*)    Neutro Abs 10.9 (*)    Monocytes Absolute 1.8 (*)    Basophils Absolute 0.2 (*)    All other components within normal limits  URINALYSIS, ROUTINE W REFLEX MICROSCOPIC - Abnormal; Notable for the following components:   Color, Urine AMBER (*)    APPearance HAZY (*)    Protein, ur 30 (*)    Leukocytes, UA MODERATE (*)    Bacteria, UA RARE (*)    Squamous Epithelial / LPF 0-5 (*)    Non Squamous Epithelial 0-5 (*)    All other components within normal limits  I-STAT CHEM 8, ED - Abnormal; Notable for the following components:   BUN 24 (*)    Creatinine, Ser 1.50 (*)    All other components within normal limits  URINE CULTURE    EKG  EKG Interpretation None       Radiology Ct Pelvis W Contrast  Result Date: 11/28/2017 CLINICAL DATA:  Left buttock infection with concern for abscess. EXAM: CT PELVIS WITH CONTRAST TECHNIQUE: Multidetector CT imaging of the pelvis was performed using the standard protocol following the bolus administration of intravenous contrast. CONTRAST:  80 mL ISOVUE-300 IV COMPARISON:  None. FINDINGS: Urinary Tract: The bladder is nearly decompressed and unremarkable in appearance by CT without evidence of wall thickening or surrounding inflammation. Bowel: Pelvic bowel loops show no evidence of inflammation or dilatation. Mild diverticulosis present of the sigmoid colon without evidence of acute diverticulitis. Vascular/Lymphatic: Iliac and femoral vessels are normal in  appearance. No evidence of lymphadenopathy in the pelvis or inguinal regions. Reproductive: Status post hysterectomy. No adnexal masses identified. Other: There is extensive cellulitis of the left buttock region extending into the medial and posterior proximal thigh. Inflammation also extends across  the perineum into the left labial region. There is no evidence by CT of focal liquefied abscess or underlying soft tissue foreign body. No soft tissue gas identified. Musculoskeletal: Bony structures are unremarkable. No evidence of osteomyelitis. IMPRESSION: Extensive left buttock cellulitis also involving the left perineum and upper posterior and medial proximal thigh. No evidence of focal abscess by CT or underlying soft tissue foreign body or gas. No evidence of osteomyelitis. Electronically Signed   By: Aletta Edouard M.D.   On: 11/28/2017 19:26    Procedures Procedures (including critical care time) EMERGENCY DEPARTMENT US SOFT TISSUE INTERPRETATION "Study: Limited Soft Tissue Ultrasound"  INDICATIONS: Soft tissue infection Multiple views of the body part were obtained in real-time with a multi-frequency linear probe PERFORMED BY:  Myself IMAGES ARCHIVED?: Yes SIDE:Left BODY PART:Left groin, labia, and rectum. FINDINGS: Cellulitis present and Other possible deep abscess present in perineum. INTERPRETATION:  Abcess present, Cellulitis present and A complex fluid filled structure approximately 2 cm deep and 2 cm wide at greatest diameter inferior to the left labia majora.   At this ultrasound was performed with EMT, Hassan Rowan, present.   CPT: Neck [id number]  Upper extremity [id number]  Axilla [id number]  Chest wall 35573-22  Beast 02542-70  Upper back 62376-28  Lower back 31517-61  Abdominal wall 60737-10  Pelvic wall 62694-85  Lower extremity 46270-35  Other soft tissue 00938-18  Medications Ordered in ED Medications  vancomycin (VANCOCIN) 1,500 mg in sodium chloride 0.9 % 500  mL IVPB (not administered)  morphine 4 MG/ML injection 4 mg (4 mg Intravenous Given 11/28/17 1807)  ondansetron (ZOFRAN) injection 4 mg (4 mg Intravenous Given 11/28/17 1807)  iopamidol (ISOVUE-300) 61 % injection (80 mLs  Contrast Given 11/28/17 1858)  sodium chloride 0.9 % bolus 1,000 mL (0 mLs Intravenous Stopped 11/28/17 2054)  morphine 4 MG/ML injection 4 mg (4 mg Intravenous Given 11/28/17 2002)     Initial Impression / Assessment and Plan / ED Course  I have reviewed the triage vital signs and the nursing notes.  Pertinent labs & imaging results that were available during my care of the patient were reviewed by me and considered in my medical decision making (see chart for details).  Clinical Course as of Nov 28 2056  Darcel Bayley  2993 Patient reevaluated.  Patient is still having significant pain in the pelvis.  Will order additional morphine dose.  [AM]    Clinical Course User Index [AM] Albesa Seen, PA-C    Final Clinical Impressions(s) / ED Diagnoses   Final diagnoses:  Cellulitis of groin, left   Patient is nontoxic-appearing, afebrile, and not tachycardic.  Patient does not meet sirs criteria at this time.  Rectal temperature is 98.7.  Patient exhibits a complex cellulitis and possible abscess in the perineal and perirectal region.  Due to the depth noted on bedside ultrasound, will require CT of pelvis to further clarify fluid collections.  CBC, BMP.  Morphine and Zofran for pain and nausea.  Prior to CT, reviewed creatinine.  Patient does have a slightly elevated creatinine 1.5 compared to prior values which were in the 1.1-1.15 range.  I discussed this with CT tech, who reports this is within her policy for administration of IV contrast as it is less than 2.  9:00 PM Spoke with Dr. Lorin Mercy.  Patient will be admitted for failure of outpatient antibiotic therapy and given IV antibiotics.  Vancomycin is ordered per pharmacy consult.  ED Discharge Orders    None  Albesa Seen, PA-C 11/29/17 0146    Nat Christen, MD 11/29/17 1536

## 2017-11-28 NOTE — Progress Notes (Signed)
Pharmacy Antibiotic Note  Courtney Crane is a 67 y.o. female admitted on 11/28/2017 with cellulitis, abscess to buttocks, s/p IM Ancef on 1/5 and d/c'd on Bactrim/Keflex.  Pharmacy has been consulted for vancomycin dosing. Afebrile, WBC 16.3. SCr 1.5 on admit, CrCl~41.  Plan: Vancomycin 1500mg  IV x 1; then 1250mg  IV q24h Monitor clinical progress, c/s, renal function F/u de-escalation plan/LOT, vancomycin trough as indicated     Temp (24hrs), Avg:99.1 F (37.3 C), Min:99.1 F (37.3 C), Max:99.1 F (37.3 C)  Recent Labs  Lab 11/28/17 1800 11/28/17 1819  WBC 16.3*  --   CREATININE  --  1.50*    CrCl cannot be calculated (Unknown ideal weight.).    Allergies  Allergen Reactions  . Demerol [Meperidine] Nausea Only  . Tape Rash   Elicia Lamp, PharmD, BCPS Clinical Pharmacist 11/28/2017 8:57 PM

## 2017-11-28 NOTE — ED Triage Notes (Signed)
Pt here with abscess to buttocks. Seen at [facility] Saturday and given IM antibiotics. Today had recheck and sent here for I&D.

## 2017-11-28 NOTE — H&P (Addendum)
History and Physical    Courtney Crane YIR:[id number] DOB: 04/16/1951 DOA: 11/28/2017  PCP: Velna Hatchet, MD Consultants:  [facility] - rheumatology; [facility] - GI; [place] - surgery; [place] ? Patient coming from:  Home - lives with husband; NOK: husband, [phone]  Chief Complaint: perineal cellulitis  HPI: Courtney Crane is a 67 y.o. female with medical history significant of IBS; HTN; HLD: and RA on hydroxychloroquine presenting with failure of outpatient therapy for cellulitis.  About last Tuesday or Wednesday, she thought she had an ingrown hair.  She went to the gym on Thursday and yoga on Friday.  On Friday night, the pain worsened and she developed chills.  Saturday AM, they came to the [facility] urgent care and she was diagnosed with cellulitis.  She was given an injection (IM Ancef) and 2 prescriptions (Keflex/Bactrim).  She finished Keflex and the Bactrim rx ends tomorrow.  She was at the doctor for her f/u appointment today and they sent her in.  The total area has gotten better "but there's an epicenter now and it is volcanic and worse."  It is extremely painful.  Warm compresses did not help.  Cool numbs it enough to make it slightly less painful.  Tylenol does not make a difference.  No fevers.    ED Course:  Cellulitis with failure of outpatient therapy.  Cellulitis is in the perineal area extending to the labia and rectum.  Given IM Ancef outpatient and then Keflex/Bactrim but worse.  CT pelvis negative for abscess but positive for extensive cellulitis.  No sepsis.  Given Vancomycin.  She has been on hydroxychloroquine since Sept so she is immunocompromised.  Review of Systems: As per HPI; otherwise review of systems reviewed and negative.   Ambulatory Status:  Ambulates without assistance  Past Medical History:  Diagnosis Date  . Anemia   . Anxiety   . Arthritis   . Asthma   . Colon polyps   . Complication of anesthesia    "sometimes I dont like to wake up after "  ie difficult to awaken   . Depression   . Diverticulitis   . Diverticulosis   . GERD (gastroesophageal reflux disease)   . HLD (hyperlipidemia)   . HTN (hypertension)   . IBS (irritable bowel syndrome)   . Insomnia   . Osteoarthritis   . PONV (postoperative nausea and vomiting)   . Rheumatoid arthritis ([place])   . Seasonal allergies   . Shingles   . Status post dilation of esophageal narrowing   . Vitamin D deficiency     Past Surgical History:  Procedure Laterality Date  . APPENDECTOMY  1975  . bladder tack  1990  . CHOLECYSTECTOMY N/A 01/31/2017   Procedure: LAPAROSCOPIC CHOLECYSTECTOMY WITH INTRAOPERATIVE CHOLANGIOGRAM;  Surgeon: Armandina Gemma, MD;  Location: [facility];  Service: General;  Laterality: N/A;  . MENISCUS REPAIR Right 2013  . ROTATOR CUFF REPAIR Right 2005  . TONSILLECTOMY     as a child  . TOTAL ABDOMINAL HYSTERECTOMY  1988  . TOTAL KNEE ARTHROPLASTY Left   . WRIST RECONSTRUCTION Right 2008    Social History   Socioeconomic History  . Marital status: Married    Spouse name: Not on file  . Number of children: 2  . Years of education: Not on file  . Highest education level: Not on file  Social Needs  . Financial resource strain: Not on file  . Food insecurity - worry: Not on file  . Food insecurity - inability:  Not on file  . Transportation needs - medical: Not on file  . Transportation needs - non-medical: Not on file  Occupational History  . Occupation: retired  Tobacco Use  . Smoking status: Never Smoker  . Smokeless tobacco: Never Used  Substance and Sexual Activity  . Alcohol use: Yes    Alcohol/week: 4.2 oz    Types: 7 Standard drinks or equivalent per week  . Drug use: No  . Sexual activity: Not on file  Other Topics Concern  . Not on file  Social History Narrative  . Not on file    Allergies  Allergen Reactions  . Demerol [Meperidine] Nausea Only  . Tape Rash    Family History  Problem Relation Age of Onset  . Colon polyps Mother    . Diabetes Mother   . Heart disease Mother   . Kidney cancer Mother   . Arthritis Mother   . Hypertension Mother   . Hyperlipidemia Mother   . Colon polyps Son   . CAD Brother     Prior to Admission medications   Medication Sig Start Date End Date Taking? Authorizing Provider  acetaminophen (TYLENOL) 325 MG tablet Take 650 mg by mouth every 6 (six) hours as needed for mild pain or moderate pain.    Yes [provider]  amLODipine (NORVASC) 10 MG tablet Take 10 mg by mouth daily.    Yes [provider]  aspirin 81 MG tablet Take 81 mg by mouth daily.   Yes [provider]  Cholecalciferol (VITAMIN D3) 10000 units TABS Take 1 tablet by mouth once a week.   Yes [provider]  diclofenac (VOLTAREN) 75 MG EC tablet Take 75 mg by mouth 2 (two) times daily.   Yes [provider]  hydrochlorothiazide (HYDRODIURIL) 25 MG tablet Take 25 mg by mouth daily.   Yes [provider]  hydroxychloroquine (PLAQUENIL) 200 MG tablet Take by mouth daily.   Yes [provider]  ibuprofen (ADVIL,MOTRIN) 200 MG tablet Take 400 mg by mouth every 6 (six) hours as needed for moderate pain.    Yes [provider]  losartan (COZAAR) 100 MG tablet Take 100 mg by mouth daily.   Yes [provider]  MELATONIN PO Take 1 tablet by mouth at bedtime as needed.    Yes [provider]  Multiple Vitamin (MULTIVITAMIN) tablet Take 1 tablet by mouth daily.   Yes [provider]  Omega-3 Fatty Acids (FISH OIL) 1000 MG CAPS Take 1 capsule by mouth 2 (two) times daily.   Yes [provider]  omeprazole (PRILOSEC) 20 MG capsule Take 20 mg daily by mouth.   Yes [provider]  PSYLLIUM PO Take 1 packet by mouth daily.   Yes [provider]  rosuvastatin (CRESTOR) 5 MG tablet Take 5 mg by mouth 3 (three) times a week. 03/07/17  Yes [provider]  sulfamethoxazole-trimethoprim (BACTRIM DS,SEPTRA DS)  800-160 MG tablet Take 1 tablet by mouth 2 (two) times daily for 7 days. 11/23/17 11/30/17 Yes Jacqualine Mau, NP  cephALEXin (KEFLEX) 500 MG capsule Take 1 capsule (500 mg total) by mouth 4 (four) times daily. Patient not taking: Reported on 11/28/2017 11/23/17   Jacqualine Mau, NP  ondansetron (ZOFRAN) 4 MG tablet Take 1 tablet (4 mg total) 2 (two) times daily by mouth. For 2 weeks Patient not taking: Reported on 11/28/2017 10/01/17   Milus Banister, MD  valACYclovir (VALTREX) 500 MG tablet Take 1  tablet twice a day for 5 days at the start of an outbreak. Patient not taking: Reported on 11/28/2017 09/28/16   Michel Bickers, MD    Physical Exam: Vitals:   11/28/17 2030 11/28/17 2103 11/28/17 2216 11/28/17 2218  BP: (!) 120/45  (!) 137/47   Pulse: 71  87   Resp:   19   Temp:   98.6 F (37 C)   TempSrc:   Oral   SpO2: 100%  100%   Weight:  70.2 kg (154 lb 12.2 oz)    Height:    5\' 2"  (1.575 m)     General: Appears calm and comfortable and is NAD Eyes:  PERRL, EOMI, normal lids, iris ENT:  grossly normal hearing, lips & tongue, mmm Neck:  no LAD, masses or thyromegaly; no carotid bruits Cardiovascular:  RRR, no m/r/g. No LE edema.  Respiratory:   CTA bilaterally with no wheezes/rales/rhonchi.  Normal respiratory effort. Abdomen:  soft, NT, ND, NABS Skin:  Significant erythema of the L labial/perineal/anal area with marked edema and fluctuance Musculoskeletal:  grossly normal tone BUE/BLE, good ROM, no bony abnormality Psychiatric:  grossly normal mood and affect, speech fluent and appropriate, AOx3 Neurologic:  CN 2-12 grossly intact, moves all extremities in coordinated fashion, sensation intact    Radiological Exams on Admission: Ct Pelvis W Contrast  Result Date: 11/28/2017 CLINICAL DATA:  Left buttock infection with concern for abscess. EXAM: CT PELVIS WITH CONTRAST TECHNIQUE: Multidetector CT imaging of the pelvis was performed using the standard protocol following  the bolus administration of intravenous contrast. CONTRAST:  80 mL ISOVUE-300 IV COMPARISON:  None. FINDINGS: Urinary Tract: The bladder is nearly decompressed and unremarkable in appearance by CT without evidence of wall thickening or surrounding inflammation. Bowel: Pelvic bowel loops show no evidence of inflammation or dilatation. Mild diverticulosis present of the sigmoid colon without evidence of acute diverticulitis. Vascular/Lymphatic: Iliac and femoral vessels are normal in appearance. No evidence of lymphadenopathy in the pelvis or inguinal regions. Reproductive: Status post hysterectomy. No adnexal masses identified. Other: There is extensive cellulitis of the left buttock region extending into the medial and posterior proximal thigh. Inflammation also extends across the perineum into the left labial region. There is no evidence by CT of focal liquefied abscess or underlying soft tissue foreign body. No soft tissue gas identified. Musculoskeletal: Bony structures are unremarkable. No evidence of osteomyelitis. IMPRESSION: Extensive left buttock cellulitis also involving the left perineum and upper posterior and medial proximal thigh. No evidence of focal abscess by CT or underlying soft tissue foreign body or gas. No evidence of osteomyelitis. Electronically Signed   By: Aletta Edouard M.D.   On: 11/28/2017 19:26    EKG: not done   Labs on Admission: I have personally reviewed the available labs and imaging studies at the time of the admission.  Pertinent labs:   Glucose 118 BUN/Creatinine/GFR 24/1.50 -> 18/1.31/41 WBC 13.9; prior 16.3 on 1/10 Hgb 16.3 -> 13.9 UA: moderate LE, 30 protein, rare bacteria  Assessment/Plan Principal Problem:   Cellulitis and abscess of buttock Active Problems:   HTN (hypertension)   AKI (acute kidney injury) ([place])   Rheumatoid arthritis ([facility])   Cellulitis of left perineal/buttock region -Patient with marked erythema/edema of left perineal/buttock  region -Previously treated very appropriately with IM Ancef and PO Bactrim and Keflex without improvement -She was given Vancomycin in the ER, will continue -Pain control with morphine -Will mark perimeter of area to gauge improvement -CT shows no drainable abscess at  this time.  AKI -Likely due to prerenal secondary to dehydration in the setting of sepsis/infection -This is likely compounded by her multiple nephrotoxic medications including Voltaren, HCTZ, Ibuprofen and Cozaar - will hold all for now -IVF -Follow up renal function by BMP -Avoid ACEI and NSAIDs  HTN -Borderline low BP -Will follow without treatment for now  RA -Patient has been taking Plaquenil for RA since the summer -In addition to today's issue, she has had persistent and refractory infections (sinus et al) that she attributes to immunocompromise -As such, she requests to come off this medication -It has an extremely long half-life and so it is very reasonable to stop it now and allow it to self-taper until she is due to follow up with rheum  DVT prophylaxis: Lovenox  Code Status:  Full - confirmed with patient/family Family Communication: Husband present throughout evaluation  Disposition Plan:  Home once clinically improved Consults called: None  Admission status: Admit - It is my clinical opinion that admission to [place] is reasonable and necessary because of the expectation that this patient will require hospital care that crosses at least 2 midnights to treat this condition based on the medical complexity of the problems presented.  Given the aforementioned information, the predictability of an adverse outcome is felt to be significant.    Karmen Bongo MD [facility]  If note is complete, please contact covering daytime or nighttime physician. [email or web address] Password [email or web address]  11/29/2017, 1:44 AM

## 2017-11-29 DIAGNOSIS — M069 Rheumatoid arthritis, unspecified: Secondary | ICD-10-CM | POA: Diagnosis present

## 2017-11-29 DIAGNOSIS — N179 Acute kidney failure, unspecified: Secondary | ICD-10-CM | POA: Diagnosis present

## 2017-11-29 HISTORY — DX: Acute kidney failure, unspecified: N17.9

## 2017-11-29 LAB — BASIC METABOLIC PANEL
Anion gap: 9 (ref 5–15)
BUN: 18 mg/dL (ref 6–20)
CHLORIDE: 103 mmol/L (ref 101–111)
CO2: 22 mmol/L (ref 22–32)
CREATININE: 1.31 mg/dL — AB (ref 0.44–1.00)
Calcium: 8.8 mg/dL — ABNORMAL LOW (ref 8.9–10.3)
GFR calc non Af Amer: 41 mL/min — ABNORMAL LOW (ref >60)
GFR, EST AFRICAN AMERICAN: 48 mL/min — AB (ref >60)
GLUCOSE: 118 mg/dL — AB (ref 65–99)
Potassium: 4.3 mmol/L (ref 3.5–5.1)
Sodium: 134 mmol/L — ABNORMAL LOW (ref 135–145)

## 2017-11-29 LAB — CBC
HCT: 34.8 % — ABNORMAL LOW (ref 36.0–46.0)
HEMOGLOBIN: 11.4 g/dL — AB (ref 12.0–15.0)
MCH: 32.1 pg (ref 26.0–34.0)
MCHC: 32.8 g/dL (ref 30.0–36.0)
MCV: 98 fL (ref 78.0–100.0)
Platelets: 196 10*3/uL (ref 150–400)
RBC: 3.55 MIL/uL — AB (ref 3.87–5.11)
RDW: 15.2 % (ref 11.5–15.5)
WBC: 13.9 10*3/uL — ABNORMAL HIGH (ref 4.0–10.5)

## 2017-11-29 LAB — HIV ANTIBODY (ROUTINE TESTING W REFLEX): HIV Screen 4th Generation wRfx: NONREACTIVE

## 2017-11-29 MED ORDER — POLYETHYLENE GLYCOL 3350 17 G PO PACK
17.0000 g | PACK | Freq: Every day | ORAL | Status: DC | PRN
  Administered 2017-11-29 – 2017-12-02 (×4): 17 g via ORAL
  Filled 2017-11-29 (×4): qty 1

## 2017-11-29 MED ORDER — AMLODIPINE BESYLATE 10 MG PO TABS
10.0000 mg | ORAL_TABLET | Freq: Every day | ORAL | Status: DC
  Administered 2017-11-29 – 2017-12-05 (×6): 10 mg via ORAL
  Filled 2017-11-29 (×7): qty 1

## 2017-11-29 MED ORDER — SODIUM CHLORIDE 0.9 % IV SOLN
1250.0000 mg | INTRAVENOUS | Status: DC
  Administered 2017-11-30: 1250 mg via INTRAVENOUS
  Filled 2017-11-29: qty 1250

## 2017-11-29 MED ORDER — SODIUM CHLORIDE 0.9 % IV SOLN
1500.0000 mg | Freq: Once | INTRAVENOUS | Status: AC
  Administered 2017-11-29: 1500 mg via INTRAVENOUS
  Filled 2017-11-29: qty 1500

## 2017-11-29 NOTE — Progress Notes (Signed)
PROGRESS NOTE    Courtney Crane  YIR:[id number] DOB: Mar 05, 1951 DOA: 11/28/2017 PCP: Velna Hatchet, MD   Brief Narrative: Courtney Crane is a 67 y.o. female with medical history significant of IBS; HTN; HLD: and RA on hydroxychloroquine. Patient presented after failing outpatient treatment for cellulitis of her perineal area.   Assessment & Plan:   Principal Problem:   Cellulitis and abscess of buttock Active Problems:   HTN (hypertension)   AKI (acute kidney injury) (H[facility])   Rheumatoid arthritis (HCC)   Cellulitis Left perineal and buttock area. Significantly tender. No drainage. CT negative for abscess -Continue Vancomycin (started this morning) -Continue morphine prn -Margins marked  Essential hypertension Hydrochlorothiazide and amlodipine held on admission -Restart amlodipine  Acute kidney injury Improving. -Oral hydration  Hyperlipidemia -Continue Crestor  Rheumatoid arthritis Patient is on Plaquenil as an outpatient but has discontinued herself.   DVT prophylaxis: Lovenox Code Status: Full code Family Communication: None at bedside Disposition Plan: Discharge home when medically stable   Consultants:   None  Procedures:   None  Antimicrobials:  Vancomycin    Subjective: Significant tenderness.  Objective: Vitals:   11/28/17 2103 11/28/17 2216 11/28/17 2218 11/29/17 0538  BP:  (!) 137/47  (!) 107/44  Pulse:  87  76  Resp:  19  20  Temp:  98.6 F (37 C)  98.2 F (36.8 C)  TempSrc:  Oral  Oral  SpO2:  100%  100%  Weight: 70.2 kg (154 lb 12.2 oz)     Height:   5\' 2"  (1.575 m)     Intake/Output Summary (Last 24 hours) at 11/29/2017 1013 Last data filed at 11/29/2017 0940 Gross per 24 hour  Intake 2240 ml  Output 375 ml  Net 1865 ml   Filed Weights   11/28/17 2103  Weight: 70.2 kg (154 lb 12.2 oz)    Examination:  General exam: Appears calm and comfortable Respiratory system: Clear to auscultation. Respiratory effort  normal. Cardiovascular system: S1 & S2 heard, RRR. No murmurs. Gastrointestinal system: Abdomen is nondistended, soft and nontender. No organomegaly or masses felt. Normal bowel sounds heard. Central nervous system: Alert and oriented. No focal neurological deficits. Extremities: No edema. No calf tenderness Skin: No cyanosis. Erythema of perineum localized more to left that extends to left buttock and left labia. Significant tenderness with induration. No drainage or fluctuance Psychiatry: Judgement and insight appear normal. Mood & affect appropriate.     Data Reviewed: I have personally reviewed following labs and imaging studies  CBC: Recent Labs  Lab 11/28/17 1800 11/28/17 1819 11/29/17 0026  WBC 16.3*  --  13.9*  NEUTROABS 10.9*  --   --   HGB 12.2 12.9 11.4*  HCT 37.2 38.0 34.8*  MCV 96.4  --  98.0  PLT 219  --  500   Basic Metabolic Panel: Recent Labs  Lab 11/28/17 1819 11/29/17 0026  NA 138 134*  K 4.7 4.3  CL 103 103  CO2  --  22  GLUCOSE 95 118*  BUN 24* 18  CREATININE 1.50* 1.31*  CALCIUM  --  8.8*   GFR: Estimated Creatinine Clearance: 38.7 mL/min (A) (by C-G formula based on SCr of 1.31 mg/dL (H)). Liver Function Tests: No results for input(s): AST, ALT, ALKPHOS, BILITOT, PROT, ALBUMIN in the last 168 hours. No results for input(s): LIPASE, AMYLASE in the last 168 hours. No results for input(s): AMMONIA in the last 168 hours. Coagulation Profile: No results for input(s): INR, PROTIME in the last 168  hours. Cardiac Enzymes: No results for input(s): CKTOTAL, CKMB, CKMBINDEX, TROPONINI in the last 168 hours. BNP (last 3 results) No results for input(s): PROBNP in the last 8760 hours. HbA1C: No results for input(s): HGBA1C in the last 72 hours. CBG: No results for input(s): GLUCAP in the last 168 hours. Lipid Profile: No results for input(s): CHOL, HDL, LDLCALC, TRIG, CHOLHDL, LDLDIRECT in the last 72 hours. Thyroid Function Tests: No results for  input(s): TSH, T4TOTAL, FREET4, T3FREE, THYROIDAB in the last 72 hours. Anemia Panel: No results for input(s): VITAMINB12, FOLATE, FERRITIN, TIBC, IRON, RETICCTPCT in the last 72 hours. Sepsis Labs: No results for input(s): PROCALCITON, LATICACIDVEN in the last 168 hours.  No results found for this or any previous visit (from the past 240 hour(s)).       Radiology Studies: Ct Pelvis W Contrast  Result Date: 11/28/2017 CLINICAL DATA:  Left buttock infection with concern for abscess. EXAM: CT PELVIS WITH CONTRAST TECHNIQUE: Multidetector CT imaging of the pelvis was performed using the standard protocol following the bolus administration of intravenous contrast. CONTRAST:  80 mL ISOVUE-300 IV COMPARISON:  None. FINDINGS: Urinary Tract: The bladder is nearly decompressed and unremarkable in appearance by CT without evidence of wall thickening or surrounding inflammation. Bowel: Pelvic bowel loops show no evidence of inflammation or dilatation. Mild diverticulosis present of the sigmoid colon without evidence of acute diverticulitis. Vascular/Lymphatic: Iliac and femoral vessels are normal in appearance. No evidence of lymphadenopathy in the pelvis or inguinal regions. Reproductive: Status post hysterectomy. No adnexal masses identified. Other: There is extensive cellulitis of the left buttock region extending into the medial and posterior proximal thigh. Inflammation also extends across the perineum into the left labial region. There is no evidence by CT of focal liquefied abscess or underlying soft tissue foreign body. No soft tissue gas identified. Musculoskeletal: Bony structures are unremarkable. No evidence of osteomyelitis. IMPRESSION: Extensive left buttock cellulitis also involving the left perineum and upper posterior and medial proximal thigh. No evidence of focal abscess by CT or underlying soft tissue foreign body or gas. No evidence of osteomyelitis. Electronically Signed   By: Aletta Edouard M.D.   On: 11/28/2017 19:26        Scheduled Meds: . aspirin  81 mg Oral Daily  . docusate sodium  100 mg Oral BID  . enoxaparin (LOVENOX) injection  40 mg Subcutaneous Q24H  . pantoprazole  40 mg Oral Daily  . rosuvastatin  5 mg Oral Once per day on Mon Wed Fri   Continuous Infusions: . lactated ringers 100 mL/hr at 11/28/17 2303  . [START ON 11/30/2017] vancomycin       LOS: 1 day     Cordelia Poche, MD [facility] 11/29/2017, 10:13 AM Pager: [phone]  If 7PM-7AM, please contact night-coverage [email or web address] Password [facility] 11/29/2017, 10:13 AM

## 2017-11-29 NOTE — Progress Notes (Signed)
Tech offered pt a bath and pt said she got here last night and would like to wait until tomorrow morning.

## 2017-11-29 NOTE — Progress Notes (Signed)
Tech offered Pt assistance with bath. Pt stated she had a shower yesterday and was fine without getting a bath today. RN aware.

## 2017-11-30 LAB — URINE CULTURE: Culture: NO GROWTH

## 2017-11-30 MED ORDER — VANCOMYCIN HCL IN DEXTROSE 750-5 MG/150ML-% IV SOLN
750.0000 mg | INTRAVENOUS | Status: DC
  Administered 2017-12-01: 750 mg via INTRAVENOUS
  Filled 2017-11-30 (×2): qty 150

## 2017-11-30 MED ORDER — HYDROCODONE-ACETAMINOPHEN 5-325 MG PO TABS
1.0000 | ORAL_TABLET | Freq: Four times a day (QID) | ORAL | Status: DC | PRN
  Administered 2017-11-30 – 2017-12-01 (×4): 2 via ORAL
  Administered 2017-12-01: 1 via ORAL
  Filled 2017-11-30 (×5): qty 2

## 2017-11-30 NOTE — Progress Notes (Signed)
PROGRESS NOTE    Courtney Crane  VOH:[id number] DOB: 1951-08-18 DOA: 11/28/2017 PCP: Velna Hatchet, MD   Brief Narrative: Courtney Crane is a 67 y.o. female with medical history significant of IBS; HTN; HLD: and RA on hydroxychloroquine. Patient presented after failing outpatient treatment for cellulitis of her perineal area.   Assessment & Plan:   Principal Problem:   Cellulitis and abscess of buttock Active Problems:   HTN (hypertension)   AKI (acute kidney injury) (HCC)   Rheumatoid arthritis (HCC)   Cellulitis Left perineal and buttock area. Significantly tender. No drainage. CT negative for abscess. Margins decreased with some fluctuance. -Continue Vancomycin -Continue morphine prn -Norco prn -Warm compress  Essential hypertension Hydrochlorothiazide and amlodipine held on admission -Restart amlodipine  Acute kidney injury Improving. -Oral hydration  Hyperlipidemia -Continue Crestor  Rheumatoid arthritis Patient is on Plaquenil as an outpatient but has discontinued herself.   DVT prophylaxis: Lovenox Code Status: Full code Family Communication: None at bedside Disposition Plan: Discharge home when medically stable   Consultants:   None  Procedures:   None  Antimicrobials:  Vancomycin    Subjective: Pain improved but still very present.  Objective: Vitals:   11/29/17 0538 11/29/17 1436 11/29/17 2133 11/30/17 0537  BP: (!) 107/44 (!) 146/52 (!) 123/49 132/60  Pulse: 76 77 72 66  Resp: 20 20 18 18   Temp: 98.2 F (36.8 C) 98.8 F (37.1 C) 99.2 F (37.3 C) 98.3 F (36.8 C)  TempSrc: Oral Oral Oral Oral  SpO2: 100% 100% 98% 99%  Weight:      Height:        Intake/Output Summary (Last 24 hours) at 11/30/2017 1018 Last data filed at 11/30/2017 0932 Gross per 24 hour  Intake 760 ml  Output 2100 ml  Net -1340 ml   Filed Weights   11/28/17 2103  Weight: 70.2 kg (154 lb 12.2 oz)    Examination:  General exam: Appears calm  and comfortable Respiratory system: Clear to auscultation. Respiratory effort normal. Cardiovascular system: S1 & S2 heard, RRR. No murmurs. Gastrointestinal system: Abdomen is nondistended, soft and nontender. Normal bowel sounds heard. Central nervous system: Alert and oriented. No focal neurological deficits. Extremities: No edema. No calf tenderness Skin: No cyanosis. Erythema of perineum localized more to left that extends to left buttock and left labia which shows decrease from margins marked yesterday. Significant tenderness with induration and some fluctuance and induration. No drainage. Psychiatry: Judgement and insight appear normal. Mood & affect appropriate.     Data Reviewed: I have personally reviewed following labs and imaging studies  CBC: Recent Labs  Lab 11/28/17 1800 11/28/17 1819 11/29/17 0026  WBC 16.3*  --  13.9*  NEUTROABS 10.9*  --   --   HGB 12.2 12.9 11.4*  HCT 37.2 38.0 34.8*  MCV 96.4  --  98.0  PLT 219  --  694   Basic Metabolic Panel: Recent Labs  Lab 11/28/17 1819 11/29/17 0026  NA 138 134*  K 4.7 4.3  CL 103 103  CO2  --  22  GLUCOSE 95 118*  BUN 24* 18  CREATININE 1.50* 1.31*  CALCIUM  --  8.8*   GFR: Estimated Creatinine Clearance: 38.7 mL/min (A) (by C-G formula based on SCr of 1.31 mg/dL (H)). Liver Function Tests: No results for input(s): AST, ALT, ALKPHOS, BILITOT, PROT, ALBUMIN in the last 168 hours. No results for input(s): LIPASE, AMYLASE in the last 168 hours. No results for input(s): AMMONIA in the last 168 hours.  Coagulation Profile: No results for input(s): INR, PROTIME in the last 168 hours. Cardiac Enzymes: No results for input(s): CKTOTAL, CKMB, CKMBINDEX, TROPONINI in the last 168 hours. BNP (last 3 results) No results for input(s): PROBNP in the last 8760 hours. HbA1C: No results for input(s): HGBA1C in the last 72 hours. CBG: No results for input(s): GLUCAP in the last 168 hours. Lipid Profile: No results for  input(s): CHOL, HDL, LDLCALC, TRIG, CHOLHDL, LDLDIRECT in the last 72 hours. Thyroid Function Tests: No results for input(s): TSH, T4TOTAL, FREET4, T3FREE, THYROIDAB in the last 72 hours. Anemia Panel: No results for input(s): VITAMINB12, FOLATE, FERRITIN, TIBC, IRON, RETICCTPCT in the last 72 hours. Sepsis Labs: No results for input(s): PROCALCITON, LATICACIDVEN in the last 168 hours.  Recent Results (from the past 240 hour(s))  Urine culture     Status: None   Collection Time: 11/28/17  7:27 PM  Result Value Ref Range Status   Specimen Description URINE, RANDOM  Final   Special Requests NONE  Final   Culture NO GROWTH  Final   Report Status 11/30/2017 FINAL  Final         Radiology Studies: Ct Pelvis W Contrast  Result Date: 11/28/2017 CLINICAL DATA:  Left buttock infection with concern for abscess. EXAM: CT PELVIS WITH CONTRAST TECHNIQUE: Multidetector CT imaging of the pelvis was performed using the standard protocol following the bolus administration of intravenous contrast. CONTRAST:  80 mL ISOVUE-300 IV COMPARISON:  None. FINDINGS: Urinary Tract: The bladder is nearly decompressed and unremarkable in appearance by CT without evidence of wall thickening or surrounding inflammation. Bowel: Pelvic bowel loops show no evidence of inflammation or dilatation. Mild diverticulosis present of the sigmoid colon without evidence of acute diverticulitis. Vascular/Lymphatic: Iliac and femoral vessels are normal in appearance. No evidence of lymphadenopathy in the pelvis or inguinal regions. Reproductive: Status post hysterectomy. No adnexal masses identified. Other: There is extensive cellulitis of the left buttock region extending into the medial and posterior proximal thigh. Inflammation also extends across the perineum into the left labial region. There is no evidence by CT of focal liquefied abscess or underlying soft tissue foreign body. No soft tissue gas identified. Musculoskeletal: Bony  structures are unremarkable. No evidence of osteomyelitis. IMPRESSION: Extensive left buttock cellulitis also involving the left perineum and upper posterior and medial proximal thigh. No evidence of focal abscess by CT or underlying soft tissue foreign body or gas. No evidence of osteomyelitis. Electronically Signed   By: Aletta Edouard M.D.   On: 11/28/2017 19:26        Scheduled Meds: . amLODipine  10 mg Oral Daily  . aspirin  81 mg Oral Daily  . docusate sodium  100 mg Oral BID  . enoxaparin (LOVENOX) injection  40 mg Subcutaneous Q24H  . pantoprazole  40 mg Oral Daily  . rosuvastatin  5 mg Oral Once per day on Mon Wed Fri   Continuous Infusions: . lactated ringers 100 mL/hr at 11/28/17 2303  . vancomycin Stopped (11/30/17 1000)     LOS: 2 days     Cordelia Poche, MD [facility] 11/30/2017, 10:18 AM Pager: [phone]  If 7PM-7AM, please contact night-coverage [email or web address] Password [facility] 11/30/2017, 10:18 AM

## 2017-12-01 ENCOUNTER — Ambulatory Visit: Payer: Self-pay | Admitting: General Surgery

## 2017-12-01 ENCOUNTER — Inpatient Hospital Stay (HOSPITAL_COMMUNITY): Payer: Medicare HMO

## 2017-12-01 LAB — BASIC METABOLIC PANEL
Anion gap: 10 (ref 5–15)
BUN: 12 mg/dL (ref 6–20)
CHLORIDE: 104 mmol/L (ref 101–111)
CO2: 24 mmol/L (ref 22–32)
CREATININE: 0.81 mg/dL (ref 0.44–1.00)
Calcium: 9.2 mg/dL (ref 8.9–10.3)
GFR calc non Af Amer: >60 mL/min (ref >60)
Glucose, Bld: 99 mg/dL (ref 65–99)
POTASSIUM: 3.7 mmol/L (ref 3.5–5.1)
SODIUM: 138 mmol/L (ref 135–145)

## 2017-12-01 LAB — CBC
HEMATOCRIT: 34.6 % — AB (ref 36.0–46.0)
Hemoglobin: 11.6 g/dL — ABNORMAL LOW (ref 12.0–15.0)
MCH: 32.5 pg (ref 26.0–34.0)
MCHC: 33.5 g/dL (ref 30.0–36.0)
MCV: 96.9 fL (ref 78.0–100.0)
PLATELETS: 196 10*3/uL (ref 150–400)
RBC: 3.57 MIL/uL — AB (ref 3.87–5.11)
RDW: 14.3 % (ref 11.5–15.5)
WBC: 12.1 10*3/uL — ABNORMAL HIGH (ref 4.0–10.5)

## 2017-12-01 LAB — SURGICAL PCR SCREEN
MRSA, PCR: NEGATIVE
STAPHYLOCOCCUS AUREUS: NEGATIVE

## 2017-12-01 NOTE — Progress Notes (Signed)
ANTIBIOTIC CONSULT NOTE   Pharmacy Consult for Vancomycin Indication: perineal abscess  Allergies  Allergen Reactions  . Demerol [Meperidine] Nausea Only  . Tape Rash    Patient Measurements: Height: 5\' 2"  (157.5 cm) Weight: 154 lb 12.2 oz (70.2 kg) IBW/kg (Calculated) : 50.1 Adjusted Body Weight:    Vital Signs: Temp: 98.5 F (36.9 C) (01/13 0546) Temp Source: Oral (01/13 0546) BP: 135/63 (01/13 0546) Pulse Rate: 63 (01/13 0546) Intake/Output from previous day: 01/12 0701 - 01/13 0700 In: 885 [P.O.:635; IV Piggyback:250] Out: 1400 [Urine:1400] Intake/Output from this shift: No intake/output data recorded.  Labs: Recent Labs    11/28/17 1800 11/28/17 1819 11/29/17 0026  WBC 16.3*  --  13.9*  HGB 12.2 12.9 11.4*  PLT 219  --  196  CREATININE  --  1.50* 1.31*   Estimated Creatinine Clearance: 38.7 mL/min (A) (by C-G formula based on SCr of 1.31 mg/dL (H)). No results for input(s): VANCOTROUGH, VANCOPEAK, VANCORANDOM, GENTTROUGH, GENTPEAK, GENTRANDOM, TOBRATROUGH, TOBRAPEAK, TOBRARND, AMIKACINPEAK, AMIKACINTROU, AMIKACIN in the last 72 hours.   Microbiology: Recent Results (from the past 720 hour(s))  Urine culture     Status: None   Collection Time: 11/28/17  7:27 PM  Result Value Ref Range Status   Specimen Description URINE, RANDOM  Final   Special Requests NONE  Final   Culture NO GROWTH  Final   Report Status 11/30/2017 FINAL  Final    Medical History: Past Medical History:  Diagnosis Date  . Anemia   . Anxiety   . Arthritis   . Asthma   . Colon polyps   . Complication of anesthesia    "sometimes I dont like to wake up after " ie difficult to awaken   . Depression   . Diverticulitis   . Diverticulosis   . GERD (gastroesophageal reflux disease)   . HLD (hyperlipidemia)   . HTN (hypertension)   . IBS (irritable bowel syndrome)   . Insomnia   . Osteoarthritis   . PONV (postoperative nausea and vomiting)   . Rheumatoid arthritis ([place])   .  Seasonal allergies   . Shingles   . Status post dilation of esophageal narrowing   . Vitamin D deficiency     Assessment: ID: [id number] for inner thigh cellulitis, hx buttock abscess given IM CTX at [facility] on 1/5 and sent home on Septra/Keflex. CT shows no drainable abscess. Temp 100.3,  WBC 13.9  Vanc 1/11 >>  1/10 UCx >>negative  Goal of Therapy:  Vancomycin trough level 10-15 mcg/ml  Plan:  Vancomycin 750mg  IV q 24h for trough 10-15.   Wendolyn Raso S. Alford Highland, PharmD, BCPS Clinical Staff Pharmacist Pager [phone]  [place], [place] 12/01/2017,9:06 AM

## 2017-12-01 NOTE — Consult Note (Signed)
Surgical Consultation Requesting provider: Dr Lonny Prude  CC: perineal cellulitis  HPI: this is a very nice 67 year old woman who is currently admitted for treatment of her refractory cellulitis of the perineum and left buttock. This began approximately 10 days ago with a mild amount of local irritation, she thought she may have an ingrown hair. She does not recall any trauma to the area. On Friday night, the pain worsened and she developed chills which prompted her to come to urgent care on Saturday morning, where she was diagnosed with cellulitis, given IV Ancef and a prescription for Keflex and Bactrim which she was compliant with taking. She followed up with her primary care doctor who noted that there was not any improvement in the symptoms. Due to continuing pain and failure to progress, she went to the emergency department. CT scan which was done on the day of admission (1/10) did not show any drainable fluid collection but there was extensive cellulitis present. She was not septic. She was started on vancomycin and mother has been a little bit of improvement in the cellulitis, it is still quite severe and she has not developed a fluctuant area in the medial left buttock. Ultrasound today that showed developing early abscess 2 x 1 cm  She has a history of rheumatoid arthritis, and was started on hydroxychloroquine several months ago. She states that she has had ongoing issues with various types of infections since then and basically has not been able to be well since starting that medication.  She is a patient of Dr. Harlow Asa, who removed her gallbladder in March and with whom she was meant to meet in a few weeks to discuss sigmoid colectomy for diverticulitis.  Allergies  Allergen Reactions  . Demerol [Meperidine] Nausea Only  . Tape Rash    Past Medical History:  Diagnosis Date  . Anemia   . Anxiety   . Arthritis   . Asthma   . Colon polyps   . Complication of anesthesia    "sometimes I  dont like to wake up after " ie difficult to awaken   . Depression   . Diverticulitis   . Diverticulosis   . GERD (gastroesophageal reflux disease)   . HLD (hyperlipidemia)   . HTN (hypertension)   . IBS (irritable bowel syndrome)   . Insomnia   . Osteoarthritis   . PONV (postoperative nausea and vomiting)   . Rheumatoid arthritis ([place])   . Seasonal allergies   . Shingles   . Status post dilation of esophageal narrowing   . Vitamin D deficiency     Past Surgical History:  Procedure Laterality Date  . APPENDECTOMY  1975  . bladder tack  1990  . CHOLECYSTECTOMY N/A 01/31/2017   Procedure: LAPAROSCOPIC CHOLECYSTECTOMY WITH INTRAOPERATIVE CHOLANGIOGRAM;  Surgeon: Armandina Gemma, MD;  Location: [facility];  Service: General;  Laterality: N/A;  . MENISCUS REPAIR Right 2013  . ROTATOR CUFF REPAIR Right 2005  . TONSILLECTOMY     as a child  . TOTAL ABDOMINAL HYSTERECTOMY  1988  . TOTAL KNEE ARTHROPLASTY Left   . WRIST RECONSTRUCTION Right 2008    Family History  Problem Relation Age of Onset  . Colon polyps Mother   . Diabetes Mother   . Heart disease Mother   . Kidney cancer Mother   . Arthritis Mother   . Hypertension Mother   . Hyperlipidemia Mother   . Colon polyps Son   . CAD Brother     Social History  Socioeconomic History  . Marital status: Married    Spouse name: None  . Number of children: 2  . Years of education: None  . Highest education level: None  Social Needs  . Financial resource strain: None  . Food insecurity - worry: None  . Food insecurity - inability: None  . Transportation needs - medical: None  . Transportation needs - non-medical: None  Occupational History  . Occupation: retired  Tobacco Use  . Smoking status: Never Smoker  . Smokeless tobacco: Never Used  Substance and Sexual Activity  . Alcohol use: Yes    Alcohol/week: 4.2 oz    Types: 7 Standard drinks or equivalent per week  . Drug use: No  . Sexual activity: None  Other Topics  Concern  . None  Social History Narrative  . None    No current facility-administered medications on file prior to encounter.    Current Outpatient Medications on File Prior to Encounter  Medication Sig Dispense Refill  . acetaminophen (TYLENOL) 325 MG tablet Take 650 mg by mouth every 6 (six) hours as needed for mild pain or moderate pain.     Marland Kitchen amLODipine (NORVASC) 10 MG tablet Take 10 mg by mouth daily.     Marland Kitchen aspirin 81 MG tablet Take 81 mg by mouth daily.    . Cholecalciferol (VITAMIN D3) 10000 units TABS Take 1 tablet by mouth once a week.    . diclofenac (VOLTAREN) 75 MG EC tablet Take 75 mg by mouth 2 (two) times daily.    . hydrochlorothiazide (HYDRODIURIL) 25 MG tablet Take 25 mg by mouth daily.    Marland Kitchen ibuprofen (ADVIL,MOTRIN) 200 MG tablet Take 400 mg by mouth every 6 (six) hours as needed for moderate pain.     Marland Kitchen losartan (COZAAR) 100 MG tablet Take 100 mg by mouth daily.    Marland Kitchen MELATONIN PO Take 1 tablet by mouth at bedtime as needed.     . Multiple Vitamin (MULTIVITAMIN) tablet Take 1 tablet by mouth daily.    . Omega-3 Fatty Acids (FISH OIL) 1000 MG CAPS Take 1 capsule by mouth 2 (two) times daily.    Marland Kitchen omeprazole (PRILOSEC) 20 MG capsule Take 20 mg daily by mouth.    . PSYLLIUM PO Take 1 packet by mouth daily.    . rosuvastatin (CRESTOR) 5 MG tablet Take 5 mg by mouth 3 (three) times a week.      Review of Systems: a complete, 10pt review of systems was completed with pertinent positives and negatives as documented in the HPI  Physical Exam: Vitals:   12/01/17 0546 12/01/17 1421  BP: 135/63 (!) 126/50  Pulse: 63 (!) 54  Resp: 18   Temp: 98.5 F (36.9 C) 99.8 F (37.7 C)  SpO2: 98% 97%   Gen: A&Ox3, no distress  Head: normocephalic, atraumatic, EOMI, anicteric.  Neck: supple without mass or thyromegaly Chest: unlabored respirations, symmetrical air entry   Cardiovascular: RRR with palpable distal pulses, no pedal edema Abdomen: soft, nondistended, nontender. No  mass or organomegaly.  Extremities: warm, without edema, no deformities  Neuro: grossly intact Psych: appropriate mood and affect  Skin: there is cellulitis along the medial left buttock extending from the perianal skin up into the left labia, laterally onto the gluteus several cm and a central area of fluctuance   CBC Latest Ref Rng & Units 12/01/2017 11/29/2017 11/28/2017  WBC 4.0 - 10.5 K/uL 12.1(H) 13.9(H) -  Hemoglobin 12.0 - 15.0 g/dL 11.6(L) 11.4(L) 12.9  Hematocrit 36.0 - 46.0 % 34.6(L) 34.8(L) 38.0  Platelets 150 - 400 K/uL 196 196 -    CMP Latest Ref Rng & Units 12/01/2017 11/29/2017 11/28/2017  Glucose 65 - 99 mg/dL 99 118(H) 95  BUN 6 - 20 mg/dL 12 18 24(H)  Creatinine 0.44 - 1.00 mg/dL 0.81 1.31(H) 1.50(H)  Sodium 135 - 145 mmol/L 138 134(L) 138  Potassium 3.5 - 5.1 mmol/L 3.7 4.3 4.7  Chloride 101 - 111 mmol/L 104 103 103  CO2 22 - 32 mmol/L 24 22 -  Calcium 8.9 - 10.3 mg/dL 9.2 8.8(L) -  Total Protein 6.0 - 8.3 g/dL - - -  Total Bilirubin 0.2 - 1.2 mg/dL - - -  Alkaline Phos 39 - 117 U/L - - -  AST 0 - 37 U/L - - -  ALT 0 - 35 U/L - - -    No results found for: INR, PROTIME  Imaging: [place] Rt Lower Bowers Soft Tissue Non Vascular  Result Date: 12/01/2017 CLINICAL DATA:  Cellulitis and possible abscess. EXAM: ULTRASOUND left LOWER EXTREMITY LIMITED TECHNIQUE: Ultrasound examination of the lower extremity soft tissues was performed in the area of clinical concern. COMPARISON:  CT pelvis 11/28/2017 IN THE AREA OF CONCERN ALONG THE LEFT BUTTOCK REGION THERE IS A 2.6 BY 0.6 BY 1.2 CM AREA OF HYPOECHOGENICITY FAVORING PHLEGMON OR EARLY ABSCESS.  OVERLYING INFILTRATIVE EDEMA IN THE SUBCUTANEOUS TISSUES OF THE LEFT PERINEUM.: IN THE AREA OF CONCERN ALONG THE LEFT BUTTOCK REGION THERE IS A 2.6 BY 0.6 BY 1.2 CM AREA OF HYPOECHOGENICITY FAVORING PHLEGMON OR EARLY ABSCESS. OVERLYING INFILTRATIVE EDEMA IN THE SUBCUTANEOUS TISSUES OF THE LEFT PERINEUM. IMPRESSION: 1. Abnormal  subcutaneous edema along the left perineum, with a somewhat confluent 2.6 by 0.6 by 1.2 cm (volume = 1 cm^3) area of hypoechogenicity favoring phlegmon or early abscess formation. Electronically Signed   By: Van Clines M.D.   On: 12/01/2017 11:03    A/P: 44WN woman with refractory left perineal/gluteal cellulitis and blossoming abscess Plan exam under anesthesia and incision and drainage of the abscess tomorrow morning with Dr. Redmond Pulling. We'll attempt to culture any fluid evacuated so as to properly direct antibiotic therapy given her failure on Keflex, Bactrim, now vancomycin. I have discussed the procedure with her patient and her husband we discussed risks of bleeding, pain, scarring, possibility of drain placement versus needing packing changes. Their questions were answered to their satisfaction. She is tentatively posted as the first case tomorrow morning. Please keep NPO after midnight.   Romana Juniper, MD [facility], [place] Pager [phone]

## 2017-12-01 NOTE — Progress Notes (Signed)
PROGRESS NOTE    Courtney Crane  ZOX:[id number] DOB: 1951/03/12 DOA: 11/28/2017 PCP: Velna Hatchet, MD   Brief Narrative: Courtney Crane is a 67 y.o. female with medical history significant of IBS; HTN; HLD: and RA on hydroxychloroquine. Patient presented after failing outpatient treatment for cellulitis of her perineal area. Started on vancomycin with some mild improvement. Possible abscess formation.   Assessment & Plan:   Principal Problem:   Cellulitis and abscess of buttock Active Problems:   HTN (hypertension)   AKI (acute kidney injury) (HCC)   Rheumatoid arthritis (HCC)   Cellulitis Left perineal and buttock area. Significantly tender. No purulent drainage. CT negative for abscess. Margins decreased with some increased fluctuance. -Continue Vancomycin -Continue morphine prn -Norco prn -Warm compress -[place] to assess if abscess has developed.  Essential hypertension Hydrochlorothiazide and amlodipine held on admission -Continue amlodipine  Acute kidney injury Improving. -Oral hydration -BMP pending  Hyperlipidemia -Continue Crestor  Rheumatoid arthritis Patient is on Plaquenil as an outpatient but has discontinued herself.   DVT prophylaxis: Lovenox Code Status: Full code Family Communication: None at bedside Disposition Plan: Discharge home when medically stable   Consultants:   None  Procedures:   None  Antimicrobials:  Vancomycin    Subjective: Patient reports some bloody drainage. No purulent drainage noted. Pain has improved.  Objective: Vitals:   11/30/17 0537 11/30/17 1500 11/30/17 2054 12/01/17 0546  BP: 132/60 (!) 117/48 (!) 134/57 135/63  Pulse: 66 76 70 63  Resp: 18 18 18 18   Temp: 98.3 F (36.8 C) 100.3 F (37.9 C) 98.7 F (37.1 C) 98.5 F (36.9 C)  TempSrc: Oral Oral Oral Oral  SpO2: 99% 99% 97% 98%  Weight:      Height:        Intake/Output Summary (Last 24 hours) at 12/01/2017 0930 Last data filed at  12/01/2017 8119 Gross per 24 hour  Intake 1185 ml  Output 1400 ml  Net -215 ml   Filed Weights   11/28/17 2103  Weight: 70.2 kg (154 lb 12.2 oz)    Examination:  General exam: Appears calm and comfortable Respiratory system: Clear to auscultation. Respiratory effort normal. Cardiovascular system: S1 & S2 heard, RRR. No murmurs. Gastrointestinal system: Abdomen is nondistended, soft and nontender. Normal bowel sounds heard. Central nervous system: Alert and oriented. No focal neurological deficits. Extremities: No edema. No calf tenderness Skin: No cyanosis. Erythema of perineum localized more to left that extends to left buttock and left labia which shows decrease from margins but appears stable from yesterday. Fluctuance appears to have progressed. Psychiatry: Judgement and insight appear normal. Mood & affect appropriate.     Data Reviewed: I have personally reviewed following labs and imaging studies  CBC: Recent Labs  Lab 11/28/17 1800 11/28/17 1819 11/29/17 0026  WBC 16.3*  --  13.9*  NEUTROABS 10.9*  --   --   HGB 12.2 12.9 11.4*  HCT 37.2 38.0 34.8*  MCV 96.4  --  98.0  PLT 219  --  147   Basic Metabolic Panel: Recent Labs  Lab 11/28/17 1819 11/29/17 0026  NA 138 134*  K 4.7 4.3  CL 103 103  CO2  --  22  GLUCOSE 95 118*  BUN 24* 18  CREATININE 1.50* 1.31*  CALCIUM  --  8.8*   GFR: Estimated Creatinine Clearance: 38.7 mL/min (A) (by C-G formula based on SCr of 1.31 mg/dL (H)). Liver Function Tests: No results for input(s): AST, ALT, ALKPHOS, BILITOT, PROT, ALBUMIN in the last  168 hours. No results for input(s): LIPASE, AMYLASE in the last 168 hours. No results for input(s): AMMONIA in the last 168 hours. Coagulation Profile: No results for input(s): INR, PROTIME in the last 168 hours. Cardiac Enzymes: No results for input(s): CKTOTAL, CKMB, CKMBINDEX, TROPONINI in the last 168 hours. BNP (last 3 results) No results for input(s): PROBNP in the last  8760 hours. HbA1C: No results for input(s): HGBA1C in the last 72 hours. CBG: No results for input(s): GLUCAP in the last 168 hours. Lipid Profile: No results for input(s): CHOL, HDL, LDLCALC, TRIG, CHOLHDL, LDLDIRECT in the last 72 hours. Thyroid Function Tests: No results for input(s): TSH, T4TOTAL, FREET4, T3FREE, THYROIDAB in the last 72 hours. Anemia Panel: No results for input(s): VITAMINB12, FOLATE, FERRITIN, TIBC, IRON, RETICCTPCT in the last 72 hours. Sepsis Labs: No results for input(s): PROCALCITON, LATICACIDVEN in the last 168 hours.  Recent Results (from the past 240 hour(s))  Urine culture     Status: None   Collection Time: 11/28/17  7:27 PM  Result Value Ref Range Status   Specimen Description URINE, RANDOM  Final   Special Requests NONE  Final   Culture NO GROWTH  Final   Report Status 11/30/2017 FINAL  Final         Radiology Studies: No results found.      Scheduled Meds: . amLODipine  10 mg Oral Daily  . aspirin  81 mg Oral Daily  . docusate sodium  100 mg Oral BID  . enoxaparin (LOVENOX) injection  40 mg Subcutaneous Q24H  . pantoprazole  40 mg Oral Daily  . rosuvastatin  5 mg Oral Once per day on Mon Wed Fri   Continuous Infusions: . lactated ringers 100 mL/hr at 11/28/17 2303  . vancomycin 750 mg (12/01/17 0842)     LOS: 3 days     Cordelia Poche, MD [facility] 12/01/2017, 9:30 AM Pager: [phone]  If 7PM-7AM, please contact night-coverage [email or web address] Password [email or web address] 12/01/2017, 9:30 AM

## 2017-12-01 NOTE — H&P (View-Only) (Signed)
Surgical Consultation Requesting provider: Dr Lonny Prude  CC: perineal cellulitis  HPI: this is a very nice 67 year old woman who is currently admitted for treatment of her refractory cellulitis of the perineum and left buttock. This began approximately 10 days ago with a mild amount of local irritation, she thought she may have an ingrown hair. She does not recall any trauma to the area. On Friday night, the pain worsened and she developed chills which prompted her to come to urgent care on Saturday morning, where she was diagnosed with cellulitis, given IV Ancef and a prescription for Keflex and Bactrim which she was compliant with taking. She followed up with her primary care doctor who noted that there was not any improvement in the symptoms. Due to continuing pain and failure to progress, she went to the emergency department. CT scan which was done on the day of admission (1/10) did not show any drainable fluid collection but there was extensive cellulitis present. She was not septic. She was started on vancomycin and mother has been a little bit of improvement in the cellulitis, it is still quite severe and she has not developed a fluctuant area in the medial left buttock. Ultrasound today that showed developing early abscess 2 x 1 cm  She has a history of rheumatoid arthritis, and was started on hydroxychloroquine several months ago. She states that she has had ongoing issues with various types of infections since then and basically has not been able to be well since starting that medication.  She is a patient of Dr. Harlow Asa, who removed her gallbladder in March and with whom she was meant to meet in a few weeks to discuss sigmoid colectomy for diverticulitis.  Allergies  Allergen Reactions  . Demerol [Meperidine] Nausea Only  . Tape Rash    Past Medical History:  Diagnosis Date  . Anemia   . Anxiety   . Arthritis   . Asthma   . Colon polyps   . Complication of anesthesia    "sometimes I  dont like to wake up after " ie difficult to awaken   . Depression   . Diverticulitis   . Diverticulosis   . GERD (gastroesophageal reflux disease)   . HLD (hyperlipidemia)   . HTN (hypertension)   . IBS (irritable bowel syndrome)   . Insomnia   . Osteoarthritis   . PONV (postoperative nausea and vomiting)   . Rheumatoid arthritis ([place])   . Seasonal allergies   . Shingles   . Status post dilation of esophageal narrowing   . Vitamin D deficiency     Past Surgical History:  Procedure Laterality Date  . APPENDECTOMY  1975  . bladder tack  1990  . CHOLECYSTECTOMY N/A 01/31/2017   Procedure: LAPAROSCOPIC CHOLECYSTECTOMY WITH INTRAOPERATIVE CHOLANGIOGRAM;  Surgeon: Armandina Gemma, MD;  Location: [facility];  Service: General;  Laterality: N/A;  . MENISCUS REPAIR Right 2013  . ROTATOR CUFF REPAIR Right 2005  . TONSILLECTOMY     as a child  . TOTAL ABDOMINAL HYSTERECTOMY  1988  . TOTAL KNEE ARTHROPLASTY Left   . WRIST RECONSTRUCTION Right 2008    Family History  Problem Relation Age of Onset  . Colon polyps Mother   . Diabetes Mother   . Heart disease Mother   . Kidney cancer Mother   . Arthritis Mother   . Hypertension Mother   . Hyperlipidemia Mother   . Colon polyps Son   . CAD Brother     Social History  Socioeconomic History  . Marital status: Married    Spouse name: None  . Number of children: 2  . Years of education: None  . Highest education level: None  Social Needs  . Financial resource strain: None  . Food insecurity - worry: None  . Food insecurity - inability: None  . Transportation needs - medical: None  . Transportation needs - non-medical: None  Occupational History  . Occupation: retired  Tobacco Use  . Smoking status: Never Smoker  . Smokeless tobacco: Never Used  Substance and Sexual Activity  . Alcohol use: Yes    Alcohol/week: 4.2 oz    Types: 7 Standard drinks or equivalent per week  . Drug use: No  . Sexual activity: None  Other Topics  Concern  . None  Social History Narrative  . None    No current facility-administered medications on file prior to encounter.    Current Outpatient Medications on File Prior to Encounter  Medication Sig Dispense Refill  . acetaminophen (TYLENOL) 325 MG tablet Take 650 mg by mouth every 6 (six) hours as needed for mild pain or moderate pain.     Marland Kitchen amLODipine (NORVASC) 10 MG tablet Take 10 mg by mouth daily.     Marland Kitchen aspirin 81 MG tablet Take 81 mg by mouth daily.    . Cholecalciferol (VITAMIN D3) 10000 units TABS Take 1 tablet by mouth once a week.    . diclofenac (VOLTAREN) 75 MG EC tablet Take 75 mg by mouth 2 (two) times daily.    . hydrochlorothiazide (HYDRODIURIL) 25 MG tablet Take 25 mg by mouth daily.    Marland Kitchen ibuprofen (ADVIL,MOTRIN) 200 MG tablet Take 400 mg by mouth every 6 (six) hours as needed for moderate pain.     Marland Kitchen losartan (COZAAR) 100 MG tablet Take 100 mg by mouth daily.    Marland Kitchen MELATONIN PO Take 1 tablet by mouth at bedtime as needed.     . Multiple Vitamin (MULTIVITAMIN) tablet Take 1 tablet by mouth daily.    . Omega-3 Fatty Acids (FISH OIL) 1000 MG CAPS Take 1 capsule by mouth 2 (two) times daily.    Marland Kitchen omeprazole (PRILOSEC) 20 MG capsule Take 20 mg daily by mouth.    . PSYLLIUM PO Take 1 packet by mouth daily.    . rosuvastatin (CRESTOR) 5 MG tablet Take 5 mg by mouth 3 (three) times a week.      Review of Systems: a complete, 10pt review of systems was completed with pertinent positives and negatives as documented in the HPI  Physical Exam: Vitals:   12/01/17 0546 12/01/17 1421  BP: 135/63 (!) 126/50  Pulse: 63 (!) 54  Resp: 18   Temp: 98.5 F (36.9 C) 99.8 F (37.7 C)  SpO2: 98% 97%   Gen: A&Ox3, no distress  Head: normocephalic, atraumatic, EOMI, anicteric.  Neck: supple without mass or thyromegaly Chest: unlabored respirations, symmetrical air entry   Cardiovascular: RRR with palpable distal pulses, no pedal edema Abdomen: soft, nondistended, nontender. No  mass or organomegaly.  Extremities: warm, without edema, no deformities  Neuro: grossly intact Psych: appropriate mood and affect  Skin: there is cellulitis along the medial left buttock extending from the perianal skin up into the left labia, laterally onto the gluteus several cm and a central area of fluctuance   CBC Latest Ref Rng & Units 12/01/2017 11/29/2017 11/28/2017  WBC 4.0 - 10.5 K/uL 12.1(H) 13.9(H) -  Hemoglobin 12.0 - 15.0 g/dL 11.6(L) 11.4(L) 12.9  Hematocrit 36.0 - 46.0 % 34.6(L) 34.8(L) 38.0  Platelets 150 - 400 K/uL 196 196 -    CMP Latest Ref Rng & Units 12/01/2017 11/29/2017 11/28/2017  Glucose 65 - 99 mg/dL 99 118(H) 95  BUN 6 - 20 mg/dL 12 18 24(H)  Creatinine 0.44 - 1.00 mg/dL 0.81 1.31(H) 1.50(H)  Sodium 135 - 145 mmol/L 138 134(L) 138  Potassium 3.5 - 5.1 mmol/L 3.7 4.3 4.7  Chloride 101 - 111 mmol/L 104 103 103  CO2 22 - 32 mmol/L 24 22 -  Calcium 8.9 - 10.3 mg/dL 9.2 8.8(L) -  Total Protein 6.0 - 8.3 g/dL - - -  Total Bilirubin 0.2 - 1.2 mg/dL - - -  Alkaline Phos 39 - 117 U/L - - -  AST 0 - 37 U/L - - -  ALT 0 - 35 U/L - - -    No results found for: INR, PROTIME  Imaging: [place] Rt Lower [place]ia Soft Tissue Non Vascular  Result Date: 12/01/2017 CLINICAL DATA:  Cellulitis and possible abscess. EXAM: ULTRASOUND left LOWER EXTREMITY LIMITED TECHNIQUE: Ultrasound examination of the lower extremity soft tissues was performed in the area of clinical concern. COMPARISON:  CT pelvis 11/28/2017 IN THE AREA OF CONCERN ALONG THE LEFT BUTTOCK REGION THERE IS A 2.6 BY 0.6 BY 1.2 CM AREA OF HYPOECHOGENICITY FAVORING PHLEGMON OR EARLY ABSCESS.  OVERLYING INFILTRATIVE EDEMA IN THE SUBCUTANEOUS TISSUES OF THE LEFT PERINEUM.: IN THE AREA OF CONCERN ALONG THE LEFT BUTTOCK REGION THERE IS A 2.6 BY 0.6 BY 1.2 CM AREA OF HYPOECHOGENICITY FAVORING PHLEGMON OR EARLY ABSCESS. OVERLYING INFILTRATIVE EDEMA IN THE SUBCUTANEOUS TISSUES OF THE LEFT PERINEUM. IMPRESSION: 1. Abnormal  subcutaneous edema along the left perineum, with a somewhat confluent 2.6 by 0.6 by 1.2 cm (volume = 1 cm^3) area of hypoechogenicity favoring phlegmon or early abscess formation. Electronically Signed   By: Van Clines M.D.   On: 12/01/2017 11:03    A/P: 40JW woman with refractory left perineal/gluteal cellulitis and blossoming abscess Plan exam under anesthesia and incision and drainage of the abscess tomorrow morning with Dr. Redmond Pulling. We'll attempt to culture any fluid evacuated so as to properly direct antibiotic therapy given her failure on Keflex, Bactrim, now vancomycin. I have discussed the procedure with her patient and her husband we discussed risks of bleeding, pain, scarring, possibility of drain placement versus needing packing changes. Their questions were answered to their satisfaction. She is tentatively posted as the first case tomorrow morning. Please keep NPO after midnight.   Romana Juniper, MD [facility], [place] Pager [phone]

## 2017-12-01 NOTE — Anesthesia Preprocedure Evaluation (Addendum)
Anesthesia Evaluation  Patient identified by MRN, date of birth, ID band Patient awake    Reviewed: Allergy & Precautions, H&P , NPO status , Patient's Chart, lab work & pertinent test results  History of Anesthesia Complications (+) PONV and history of anesthetic complications  Airway Mallampati: I   Neck ROM: full    Dental   Pulmonary asthma (mild, controlled) ,    breath sounds clear to auscultation       Cardiovascular hypertension, Pt. on medications  Rhythm:regular Rate:Normal  ECG: SB, rate 59   Neuro/Psych PSYCHIATRIC DISORDERS Anxiety Depression negative neurological ROS     GI/Hepatic Neg liver ROS, GERD  Medicated and Controlled,IBS (irritable bowel syndrome)   Endo/Other  negative endocrine ROS  Renal/GU negative Renal ROS     Musculoskeletal  (+) Arthritis ,   Abdominal   Peds  Hematology  (+) anemia , HLD   Anesthesia Other Findings   Reproductive/Obstetrics                            Anesthesia Physical  Anesthesia Plan  ASA: III  Anesthesia Plan: General   Post-op Pain Management:    Induction: Intravenous  PONV Risk Score and Plan: 4 or greater and Midazolam, Dexamethasone and Ondansetron  Airway Management Planned: LMA  Additional Equipment:   Intra-op Plan:   Post-operative Plan: Extubation in OR  Informed Consent: I have reviewed the patients History and Physical, chart, labs and discussed the procedure including the risks, benefits and alternatives for the proposed anesthesia with the patient or authorized representative who has indicated his/her understanding and acceptance.   Dental advisory given  Plan Discussed with: CRNA  Anesthesia Plan Comments: Lyla Son)       Anesthesia Quick Evaluation

## 2017-12-01 NOTE — Progress Notes (Signed)
Pt NPO midnight for incision and drainage of perirectal abscess due to cellulitis and abscess of buttocks, PCR and CHG done, with consent, husband aware of the OR schedule.

## 2017-12-02 ENCOUNTER — Inpatient Hospital Stay (HOSPITAL_COMMUNITY): Payer: Medicare HMO | Admitting: Anesthesiology

## 2017-12-02 ENCOUNTER — Encounter (HOSPITAL_COMMUNITY)
Admission: EM | Disposition: A | Discharge status: Home / Self Care | Payer: Self-pay | Source: Home / Self Care | Attending: Family Medicine

## 2017-12-02 DIAGNOSIS — M069 Rheumatoid arthritis, unspecified: Secondary | ICD-10-CM

## 2017-12-02 DIAGNOSIS — I10 Essential (primary) hypertension: Secondary | ICD-10-CM

## 2017-12-02 DIAGNOSIS — L0231 Cutaneous abscess of buttock: Secondary | ICD-10-CM

## 2017-12-02 DIAGNOSIS — E785 Hyperlipidemia, unspecified: Secondary | ICD-10-CM

## 2017-12-02 DIAGNOSIS — M1990 Unspecified osteoarthritis, unspecified site: Secondary | ICD-10-CM

## 2017-12-02 DIAGNOSIS — N179 Acute kidney failure, unspecified: Secondary | ICD-10-CM

## 2017-12-02 DIAGNOSIS — L03317 Cellulitis of buttock: Secondary | ICD-10-CM

## 2017-12-02 HISTORY — PX: INCISION AND DRAINAGE PERIRECTAL ABSCESS: SHX1804

## 2017-12-02 LAB — CBC
HCT: 37.6 % (ref 36.0–46.0)
Hemoglobin: 12.4 g/dL (ref 12.0–15.0)
MCH: 32 pg (ref 26.0–34.0)
MCHC: 33 g/dL (ref 30.0–36.0)
MCV: 96.9 fL (ref 78.0–100.0)
PLATELETS: 221 10*3/uL (ref 150–400)
RBC: 3.88 MIL/uL (ref 3.87–5.11)
RDW: 14 % (ref 11.5–15.5)
WBC: 8.5 10*3/uL (ref 4.0–10.5)

## 2017-12-02 LAB — BASIC METABOLIC PANEL
Anion gap: 11 (ref 5–15)
BUN: 11 mg/dL (ref 6–20)
CALCIUM: 9.4 mg/dL (ref 8.9–10.3)
CHLORIDE: 103 mmol/L (ref 101–111)
CO2: 24 mmol/L (ref 22–32)
CREATININE: 0.77 mg/dL (ref 0.44–1.00)
GFR calc Af Amer: >60 mL/min (ref >60)
Glucose, Bld: 121 mg/dL — ABNORMAL HIGH (ref 65–99)
Potassium: 4.1 mmol/L (ref 3.5–5.1)
SODIUM: 138 mmol/L (ref 135–145)

## 2017-12-02 SURGERY — INCISION AND DRAINAGE, ABSCESS, PERIRECTAL
Anesthesia: General | Site: Perineum

## 2017-12-02 MED ORDER — LACTATED RINGERS IV SOLN
INTRAVENOUS | Status: DC
  Administered 2017-12-02 – 2017-12-03 (×2): via INTRAVENOUS

## 2017-12-02 MED ORDER — PROPOFOL 10 MG/ML IV BOLUS
INTRAVENOUS | Status: AC
  Filled 2017-12-02: qty 20

## 2017-12-02 MED ORDER — SUGAMMADEX SODIUM 200 MG/2ML IV SOLN
INTRAVENOUS | Status: AC
  Filled 2017-12-02: qty 2

## 2017-12-02 MED ORDER — GABAPENTIN 600 MG PO TABS
300.0000 mg | ORAL_TABLET | Freq: Two times a day (BID) | ORAL | Status: DC
  Administered 2017-12-02 – 2017-12-05 (×7): 300 mg via ORAL
  Filled 2017-12-02 (×7): qty 1

## 2017-12-02 MED ORDER — PHENYLEPHRINE 40 MCG/ML (10ML) SYRINGE FOR IV PUSH (FOR BLOOD PRESSURE SUPPORT)
PREFILLED_SYRINGE | INTRAVENOUS | Status: AC
  Filled 2017-12-02: qty 10

## 2017-12-02 MED ORDER — MORPHINE SULFATE (PF) 4 MG/ML IV SOLN
1.0000 mg | INTRAVENOUS | Status: DC | PRN
  Administered 2017-12-02: 2 mg via INTRAVENOUS
  Filled 2017-12-02: qty 1

## 2017-12-02 MED ORDER — ROCURONIUM BROMIDE 10 MG/ML (PF) SYRINGE
PREFILLED_SYRINGE | INTRAVENOUS | Status: AC
  Filled 2017-12-02: qty 5

## 2017-12-02 MED ORDER — DEXAMETHASONE SODIUM PHOSPHATE 10 MG/ML IJ SOLN
INTRAMUSCULAR | Status: AC
  Filled 2017-12-02: qty 1

## 2017-12-02 MED ORDER — OXYCODONE HCL 5 MG/5ML PO SOLN: 5.0000 mg | Freq: Once | ORAL | Status: DC | PRN

## 2017-12-02 MED ORDER — ACETAMINOPHEN 325 MG PO TABS
650.0000 mg | ORAL_TABLET | Freq: Four times a day (QID) | ORAL | Status: DC
  Administered 2017-12-02 – 2017-12-05 (×12): 650 mg via ORAL
  Filled 2017-12-02 (×13): qty 2

## 2017-12-02 MED ORDER — PROPOFOL 10 MG/ML IV BOLUS
INTRAVENOUS | Status: DC | PRN
  Administered 2017-12-02: 150 mg via INTRAVENOUS

## 2017-12-02 MED ORDER — 0.9 % SODIUM CHLORIDE (POUR BTL) OPTIME
TOPICAL | Status: DC | PRN
  Administered 2017-12-02: 1000 mL

## 2017-12-02 MED ORDER — EPHEDRINE 5 MG/ML INJ
INTRAVENOUS | Status: AC
  Filled 2017-12-02: qty 10

## 2017-12-02 MED ORDER — FENTANYL CITRATE (PF) 250 MCG/5ML IJ SOLN
INTRAMUSCULAR | Status: AC
  Filled 2017-12-02: qty 5

## 2017-12-02 MED ORDER — MIDAZOLAM HCL 2 MG/2ML IJ SOLN
INTRAMUSCULAR | Status: AC
  Filled 2017-12-02: qty 2

## 2017-12-02 MED ORDER — KETOROLAC TROMETHAMINE 30 MG/ML IJ SOLN
INTRAMUSCULAR | Status: DC | PRN
  Administered 2017-12-02: 30 mg via INTRAVENOUS

## 2017-12-02 MED ORDER — FENTANYL CITRATE (PF) 100 MCG/2ML IJ SOLN
INTRAMUSCULAR | Status: DC | PRN
  Administered 2017-12-02: 25 ug via INTRAVENOUS
  Administered 2017-12-02 (×2): 50 ug via INTRAVENOUS

## 2017-12-02 MED ORDER — OXYCODONE HCL 5 MG PO TABS: 5.0000 mg | ORAL_TABLET | Freq: Once | ORAL | Status: DC | PRN

## 2017-12-02 MED ORDER — HYDROMORPHONE HCL 1 MG/ML IJ SOLN
0.2500 mg | INTRAMUSCULAR | Status: DC | PRN
  Administered 2017-12-02 (×2): 0.5 mg via INTRAVENOUS

## 2017-12-02 MED ORDER — PROMETHAZINE HCL 25 MG/ML IJ SOLN: 6.2500 mg | INTRAMUSCULAR | Status: DC | PRN

## 2017-12-02 MED ORDER — LACTATED RINGERS IV SOLN
INTRAVENOUS | Status: DC | PRN
  Administered 2017-12-02: 07:00:00 via INTRAVENOUS

## 2017-12-02 MED ORDER — ENOXAPARIN SODIUM 40 MG/0.4ML ~~LOC~~ SOLN
40.0000 mg | SUBCUTANEOUS | Status: DC
  Administered 2017-12-02 – 2017-12-04 (×3): 40 mg via SUBCUTANEOUS
  Filled 2017-12-02 (×3): qty 0.4

## 2017-12-02 MED ORDER — SUCCINYLCHOLINE CHLORIDE 200 MG/10ML IV SOSY
PREFILLED_SYRINGE | INTRAVENOUS | Status: AC
  Filled 2017-12-02: qty 10

## 2017-12-02 MED ORDER — DEXAMETHASONE SODIUM PHOSPHATE 10 MG/ML IJ SOLN
INTRAMUSCULAR | Status: DC | PRN
  Administered 2017-12-02: 10 mg via INTRAVENOUS

## 2017-12-02 MED ORDER — LIDOCAINE 2% (20 MG/ML) 5 ML SYRINGE
INTRAMUSCULAR | Status: AC
  Filled 2017-12-02: qty 5

## 2017-12-02 MED ORDER — HYDROMORPHONE HCL 1 MG/ML IJ SOLN
INTRAMUSCULAR | Status: AC
  Filled 2017-12-02: qty 1

## 2017-12-02 MED ORDER — ONDANSETRON HCL 4 MG/2ML IJ SOLN
INTRAMUSCULAR | Status: AC
Start: 2017-12-02 — End: 2017-12-02
  Filled 2017-12-02: qty 2

## 2017-12-02 MED ORDER — LIDOCAINE HCL (CARDIAC) 20 MG/ML IV SOLN
INTRAVENOUS | Status: DC | PRN
  Administered 2017-12-02: 50 mg via INTRAVENOUS

## 2017-12-02 MED ORDER — MIDAZOLAM HCL 5 MG/5ML IJ SOLN
INTRAMUSCULAR | Status: DC | PRN
  Administered 2017-12-02: 1 mg via INTRAVENOUS

## 2017-12-02 MED ORDER — VANCOMYCIN HCL IN DEXTROSE 750-5 MG/150ML-% IV SOLN
750.0000 mg | Freq: Two times a day (BID) | INTRAVENOUS | Status: DC
  Administered 2017-12-02 – 2017-12-04 (×5): 750 mg via INTRAVENOUS
  Filled 2017-12-02 (×5): qty 150

## 2017-12-02 MED ORDER — OXYCODONE HCL 5 MG PO TABS
5.0000 mg | ORAL_TABLET | ORAL | Status: DC | PRN
  Administered 2017-12-02: 10 mg via ORAL
  Administered 2017-12-03: 5 mg via ORAL
  Administered 2017-12-03 – 2017-12-05 (×8): 10 mg via ORAL
  Filled 2017-12-02: qty 2
  Filled 2017-12-02: qty 1
  Filled 2017-12-02 (×9): qty 2

## 2017-12-02 SURGICAL SUPPLY — 38 items
BLADE SURG 15 STRL LF DISP TIS (BLADE) ×1 IMPLANT
BLADE SURG 15 STRL SS (BLADE) ×1
BRIEF STRETCH FOR OB PAD LRG (UNDERPADS AND DIAPERS) ×2 IMPLANT
CANISTER SUCT 3000ML PPV (MISCELLANEOUS) ×2 IMPLANT
COVER SURGICAL LIGHT HANDLE (MISCELLANEOUS) ×2 IMPLANT
DRAPE LAPAROTOMY 100X72 PEDS (DRAPES) IMPLANT
DRAPE PROXIMA HALF (DRAPES) IMPLANT
DRSG PAD ABDOMINAL 8X10 ST (GAUZE/BANDAGES/DRESSINGS) ×2 IMPLANT
ELECT CAUTERY BLADE 6.4 (BLADE) ×2 IMPLANT
ELECT REM PT RETURN 9FT ADLT (ELECTROSURGICAL) ×2
ELECTRODE REM PT RTRN 9FT ADLT (ELECTROSURGICAL) ×1 IMPLANT
GAUZE IODOFORM PACK 1/2 7832 (GAUZE/BANDAGES/DRESSINGS) IMPLANT
GAUZE PACKING IODOFORM 1/2 (PACKING) ×2 IMPLANT
GAUZE SPONGE 4X4 12PLY STRL (GAUZE/BANDAGES/DRESSINGS) ×2 IMPLANT
GEL ULTRASOUND 20GR AQUASONIC (MISCELLANEOUS) ×4 IMPLANT
GLOVE BIOGEL M STRL SZ7.5 (GLOVE) ×2 IMPLANT
GLOVE BIOGEL PI IND STRL 8 (GLOVE) ×1 IMPLANT
GLOVE BIOGEL PI INDICATOR 8 (GLOVE) ×1
GOWN STRL REUS W/ TWL LRG LVL3 (GOWN DISPOSABLE) ×2 IMPLANT
GOWN STRL REUS W/TWL 2XL LVL3 (GOWN DISPOSABLE) ×2 IMPLANT
GOWN STRL REUS W/TWL LRG LVL3 (GOWN DISPOSABLE) ×2
KIT BASIN OR (CUSTOM PROCEDURE TRAY) ×2 IMPLANT
KIT ROOM TURNOVER OR (KITS) ×2 IMPLANT
NEEDLE 18GX1X1/2 (RX/OR ONLY) (NEEDLE) ×4 IMPLANT
NS IRRIG 1000ML POUR BTL (IV SOLUTION) ×2 IMPLANT
PACK LITHOTOMY IV (CUSTOM PROCEDURE TRAY) ×2 IMPLANT
PACK SURGICAL SETUP 50X90 (CUSTOM PROCEDURE TRAY) IMPLANT
PAD ARMBOARD 7.5X6 YLW CONV (MISCELLANEOUS) ×2 IMPLANT
PENCIL BUTTON HOLSTER BLD 10FT (ELECTRODE) ×2 IMPLANT
SPONGE LAP 18X18 X RAY DECT (DISPOSABLE) ×2 IMPLANT
SWAB COLLECTION DEVICE MRSA (MISCELLANEOUS) ×2 IMPLANT
SWAB CULTURE ESWAB REG 1ML (MISCELLANEOUS) ×2 IMPLANT
SYR CONTROL 10ML LL (SYRINGE) ×2 IMPLANT
TOWEL OR 17X24 6PK STRL BLUE (TOWEL DISPOSABLE) ×4 IMPLANT
TOWEL OR 17X26 10 PK STRL BLUE (TOWEL DISPOSABLE) IMPLANT
TUBE CONNECTING 12X1/4 (SUCTIONS) ×2 IMPLANT
UNDERPAD 30X30 INCONTINENT (UNDERPADS AND DIAPERS) ×2 IMPLANT
YANKAUER SUCT BULB TIP NO VENT (SUCTIONS) ×2 IMPLANT

## 2017-12-02 NOTE — Consult Note (Signed)
[facility] for Infectious Disease    Date of Admission:  11/28/2017   Total days of antibiotics 10        Day 4 vancomycin              Reason for Consult: Cellulitis with abscess formation in the left buttock and perineum    Referring Provider: Dr. Cordelia Poche  Assessment: She seems to have had some improvement on vancomycin and Gram stain suggest staph so I will continue vancomycin pending further observation and culture results.  Plan: 1. Continue vancomycin  Principal Problem:   Cellulitis and abscess of buttock Active Problems:   HTN (hypertension)   AKI (acute kidney injury) (HCC)   Rheumatoid arthritis (HCC)   Scheduled Meds: . acetaminophen  650 mg Oral Q6H  . amLODipine  10 mg Oral Daily  . aspirin  81 mg Oral Daily  . docusate sodium  100 mg Oral BID  . enoxaparin (LOVENOX) injection  40 mg Subcutaneous Q24H  . gabapentin  300 mg Oral BID  . HYDROmorphone      . pantoprazole  40 mg Oral Daily  . rosuvastatin  5 mg Oral Once per day on Mon Wed Fri   Continuous Infusions: . lactated ringers 50 mL/hr at 12/02/17 1025  . vancomycin 750 mg (12/02/17 1105)   PRN Meds:.Melatonin, morphine injection, ondansetron **OR** ondansetron (ZOFRAN) IV, oxyCODONE, polyethylene glycol  HPI: Courtney Crane is a 67 y.o. female arthritis who developed painful swelling left buttock on 11/20/2017.  She had some low-grade fever and chills at home leading her to go to urgent care on 11/23/2017.  There was no evidence of drainable abscess.  She was given 1 dose of IV cefazolin and discharged on oral cephalexin and trimethoprim sulfamethoxazole.  She filled these promptly and took them correctly but had progressive pain, redness and swelling leading to admission on 11/28/2017.  CT scan did not show any drainable abscess.  She was started on IV vancomycin.  She feels like her symptoms stabilized on vancomycin but she was not getting better.  Ultrasound performed yesterday showed  a question of early abscess.  She was taken to the OR this morning and that area was aspirated yielding seropurulent fluid that shows gram-positive cocci in clusters on Gram stain.  It was opened and drained and packed.  She is feeling better this afternoon. she was able to sit comfortably for the first time since this problem began.  She has no history of similar problems.  Of note, her husband recently had a bout of cellulitis.  She is concerned that she has had recurrent infections (sinusitis, diverticulitis cellulitis) since starting hydroxychloroquine last year.  She has noted significant improvement in her morning pain and stiffness since starting hydroxychloroquine.   Review of Systems: Review of Systems  Constitutional: Positive for chills and fever. Negative for diaphoresis and weight loss.  HENT: Negative for congestion and sore throat.   Respiratory: Negative for cough, sputum production and shortness of breath.   Cardiovascular: Negative for chest pain.  Gastrointestinal: Negative for abdominal pain, diarrhea, nausea and vomiting.  Genitourinary: Negative for dysuria.  Musculoskeletal: Positive for joint pain.  Skin: Negative for rash.  Neurological: Positive for headaches.    Past Medical History:  Diagnosis Date  . Anemia   . Anxiety   . Arthritis   . Asthma   . Colon polyps   . Complication of anesthesia    "sometimes I dont like  to wake up after " ie difficult to awaken   . Depression   . Diverticulitis   . Diverticulosis   . GERD (gastroesophageal reflux disease)   . HLD (hyperlipidemia)   . HTN (hypertension)   . IBS (irritable bowel syndrome)   . Insomnia   . Osteoarthritis   . PONV (postoperative nausea and vomiting)   . Rheumatoid arthritis ([place])   . Seasonal allergies   . Shingles   . Status post dilation of esophageal narrowing   . Vitamin D deficiency     Social History   Tobacco Use  . Smoking status: Never Smoker  . Smokeless tobacco: Never Used   Substance Use Topics  . Alcohol use: Yes    Alcohol/week: 4.2 oz    Types: 7 Standard drinks or equivalent per week  . Drug use: No    Family History  Problem Relation Age of Onset  . Colon polyps Mother   . Diabetes Mother   . Heart disease Mother   . Kidney cancer Mother   . Arthritis Mother   . Hypertension Mother   . Hyperlipidemia Mother   . Colon polyps Son   . CAD Brother    Allergies  Allergen Reactions  . Demerol [Meperidine] Nausea Only  . Tape Rash    OBJECTIVE: Blood pressure (!) 121/46, pulse 65, temperature 98.2 F (36.8 C), temperature source Oral, resp. rate 18, height 5\' 2"  (1.575 m), weight 154 lb 12.2 oz (70.2 kg), SpO2 96 %.  Physical Exam  Constitutional: She is oriented to person, place, and time.  She is resting comfortably in bed.  Husband is visiting.  Cardiovascular: Normal rate and regular rhythm.  No murmur heard. Pulmonary/Chest: Effort normal and breath sounds normal.  Abdominal: Soft. She exhibits no distension. There is no tenderness.  Left buttock surgical site is clean dry dressing in place.  Neurological: She is alert and oriented to person, place, and time.  Skin: No rash noted.  Psychiatric: Mood and affect normal.    Lab Results Lab Results  Component Value Date   WBC 8.5 12/02/2017   HGB 12.4 12/02/2017   HCT 37.6 12/02/2017   MCV 96.9 12/02/2017   PLT 221 12/02/2017    Lab Results  Component Value Date   CREATININE 0.77 12/02/2017   BUN 11 12/02/2017   NA 138 12/02/2017   K 4.1 12/02/2017   CL 103 12/02/2017   CO2 24 12/02/2017    Lab Results  Component Value Date   ALT 85 (H) 10/01/2017   AST 46 (H) 10/01/2017   ALKPHOS 56 10/01/2017   BILITOT 0.3 10/01/2017     Microbiology: Recent Results (from the past 240 hour(s))  Urine culture     Status: None   Collection Time: 11/28/17  7:27 PM  Result Value Ref Range Status   Specimen Description URINE, RANDOM  Final   Special Requests NONE  Final   Culture  NO GROWTH  Final   Report Status 11/30/2017 FINAL  Final  Surgical pcr screen     Status: None   Collection Time: 12/01/17  9:38 PM  Result Value Ref Range Status   MRSA, PCR NEGATIVE NEGATIVE Final   Staphylococcus aureus NEGATIVE NEGATIVE Final    Comment: (NOTE) The Xpert SA Assay (FDA approved for NASAL specimens in patients 51 years of age and older), is one component of a comprehensive surveillance program. It is not intended to diagnose infection nor to guide or monitor treatment.   Aerobic/Anaerobic  Culture (surgical/deep wound)     Status: None (Preliminary result)   Collection Time: 12/02/17  8:03 AM  Result Value Ref Range Status   Specimen Description ABSCESS  Final   Special Requests PERIRECTAL  Final   Gram Stain   Final    RARE WBC PRESENT, PREDOMINANTLY PMN RARE GRAM POSITIVE COCCI IN CLUSTERS    Culture PENDING  Incomplete   Report Status PENDING  Incomplete    Michel Bickers, MD [facility] [phone] pager   [phone] cell 12/02/2017, 4:07 PM

## 2017-12-02 NOTE — Anesthesia Procedure Notes (Signed)
Procedure Name: LMA Insertion Date/Time: 12/02/2017 7:42 AM Performed by: Shirlyn Goltz, CRNA Pre-anesthesia Checklist: Patient identified, Emergency Drugs available, Suction available and Patient being monitored Patient Re-evaluated:Patient Re-evaluated prior to induction Oxygen Delivery Method: Circle system utilized Preoxygenation: Pre-oxygenation with 100% oxygen Induction Type: IV induction Ventilation: Mask ventilation without difficulty LMA: LMA inserted LMA Size: 4.0 Number of attempts: 1 Placement Confirmation: positive ETCO2 and breath sounds checked- equal and bilateral Tube secured with: Tape Dental Injury: Teeth and Oropharynx as per pre-operative assessment

## 2017-12-02 NOTE — Op Note (Signed)
12/02/2017  8:19 AM  PATIENT:  Courtney Crane  67 y.o. female  PRE-OPERATIVE DIAGNOSIS:  Left PERINEAL, Buttock, & labial cellulitis with ABSCESS  POST-OPERATIVE DIAGNOSIS:  same  PROCEDURE:  Procedure(s): EXAM UNDER ANESTHESIA Incision, drainage, debridement of left perineal, buttock, and labial cellulitis and abscess  SURGEON:  Surgeon(s): Greer Pickerel, MD  ASSISTANTS: none   ANESTHESIA:   general  DRAINS: none   LOCAL MEDICATIONS USED:  NONE  SPECIMEN:  Source of Specimen:  aspriate of L perineal cellulitis/abscess  DISPOSITION OF SPECIMEN:  PATHOLOGY  COUNTS:  YES  INDICATION FOR PROCEDURE: 67 year old female who was admitted a few days ago with ongoing and worsening left perineal and buttock cellulitis.  She had been on broad-spectrum antibiotics however her cellulitis was not really improving.  Imaging revealed extensive cellulitis but no evidence of soft tissue necrosis.  Follow-up ultrasound revealed potentially 2 areas of abscess formation.  Because she was not making adequate progress surgery was consulted.  She was felt that she might benefit from incision, drainage and debridement of the areas.  Please see chart for additional details regarding discussion of the procedure with the patient and her husband  PROCEDURE: After obtaining informed consent, the patient was taken to the operating room and placed supine on the operating room table.  Sequential compression devices were placed.  General endotracheal anesthesia was established.  She was placed in lithotomy position with the appropriate padding.  Her perineum, labia and buttocks were prepped and draped in the usual standard surgical fashion with Betadine.  She was on broad-spectrum scheduled antibiotics.  A surgical timeout was performed.  She had extensive cellulitis starting at the left labia going down to the medial left buttock.  Her cellulitis was approximately 20 cm x 8 cm.  There is at least one focal area  of fluctuance several centimeters lateral to the anus.  Digital rectal exam revealed no internal component.  Pelvic exam also revealed no internal disease as well.  I aspirated the area of fluctuance with 18-gauge needle and obtained some seropurulent fluid.  I decided to debride this area.  This was done sharply with scalpel.  I then extended the excision caudad.  There is no gross purulence.  Just extensive soft tissue inflammation.  There was a fair amount of oozing from the surgical bed.  Hemostasis was achieved with electrocautery.  The wound was packed with half inch packing strip.  We then placed dry gauze and mesh underwear.  The patient was placed in supine position.  She was extubated and taken to recovery room in stable condition.  There were no immediate complications.  The patient tolerated procedure well    Tool used for debridement (curette, scapel, etc.)  scapel    Frequency of surgical debridement.   initial    Area and depth of devitalized tissue removed from wound.  Debrided 2 x 2 cm; incision length 4.5 cm   Blood loss and description of tissue removed.  20 cc; skin/soft tissue     Was there any viable tissue removed (measurements): no   PLAN OF CARE: pacu  PATIENT DISPOSITION:  PACU - hemodynamically stable.   Delay start of Pharmacological VTE agent (>24hrs) due to surgical blood loss or risk of bleeding:  no  Leighton Ruff. Redmond Pulling, MD, FACS General, Bariatric, & Minimally Invasive Surgery [facility], [place]

## 2017-12-02 NOTE — Transfer of Care (Signed)
Immediate Anesthesia Transfer of Care Note  Patient: Courtney Crane  Procedure(s) Performed: IRRIGATION AND DEBRIDEMENT PERINEAL ABSCESS (N/A Perineum) EXAM UNDER ANESTHESIA (N/A Perineum)  Patient Location: PACU  Anesthesia Type:General  Level of Consciousness: awake, alert , oriented and patient cooperative  Airway & Oxygen Therapy: Patient Spontanous Breathing  Post-op Assessment: Report given to RN and Post -op Vital signs reviewed and stable  Post vital signs: Reviewed and stable  Last Vitals:  Vitals:   12/01/17 1421 12/01/17 2035  BP: (!) 126/50 130/67  Pulse: (!) 54 66  Resp:  18  Temp: 37.7 C 37 C  SpO2: 97% 100%    Last Pain:  Vitals:   12/02/17 0012  TempSrc:   PainSc: 0-No pain      Patients Stated Pain Goal: 0 (11/11/81 5003)  Complications: No apparent anesthesia complications

## 2017-12-02 NOTE — Progress Notes (Signed)
Report given to lauren reynolds rn as caregiver 

## 2017-12-02 NOTE — Interval H&P Note (Signed)
History and Physical Interval Note:  12/02/2017 7:32 AM  Courtney Crane  has presented today for surgery, with the diagnosis of PERINEAL ABSCESS  The various methods of treatment have been discussed with the patient and family. After consideration of risks, benefits and other options for treatment, the patient has consented to  Procedure(s): IRRIGATION AND DEBRIDEMENT PERINEAL ABSCESS (N/A) EXAM UNDER ANESTHESIA (N/A) as a surgical intervention .  The patient's history has been reviewed, patient examined, no change in status, stable for surgery.  I have reviewed the patient's chart and labs.  Questions were answered to the patient's satisfaction.    Patient seen and examined Extensive left perineal, buttock cellulitis with areas of fluctuance  Discussed procedure with patient and husband.  Discussed typical aftercare.  Discussed risk and benefits including but not limited to bleeding, infection, injury to surrounding structures, blood clot formation, perioperative cardiac and pulmonary events, numbness, wound care issues.  All questions asked and answered  Greer Pickerel

## 2017-12-02 NOTE — Anesthesia Postprocedure Evaluation (Signed)
Anesthesia Post Note  Patient: Benjamine Mola Smoots  Procedure(s) Performed: IRRIGATION AND DEBRIDEMENT PERINEAL ABSCESS (N/A Perineum) EXAM UNDER ANESTHESIA (N/A Perineum)     Patient location during evaluation: PACU Anesthesia Type: General Level of consciousness: awake and alert Pain management: pain level controlled Vital Signs Assessment: post-procedure vital signs reviewed and stable Respiratory status: spontaneous breathing, nonlabored ventilation, respiratory function stable and patient connected to nasal cannula oxygen Cardiovascular status: blood pressure returned to baseline and stable Postop Assessment: no apparent nausea or vomiting Anesthetic complications: no    Last Vitals:  Vitals:   12/02/17 0930 12/02/17 1005  BP: (!) 106/49 (!) 151/61  Pulse: (!) 52 (!) 55  Resp: 10 16  Temp: 36.4 C (!) 36.4 C  SpO2: 100% 98%    Last Pain:  Vitals:   12/02/17 1005  TempSrc: Oral  PainSc:                  Ebony Rickel P Rukaya Kleinschmidt

## 2017-12-02 NOTE — Progress Notes (Addendum)
PROGRESS NOTE    Courtney Crane  KGU:[id number] DOB: 1951/04/24 DOA: 11/28/2017 PCP: Velna Hatchet, MD   Brief Narrative: Courtney Crane is a 67 y.o. female with medical history significant of IBS; HTN; HLD: and RA on hydroxychloroquine. Patient presented after failing outpatient treatment for cellulitis of her perineal area. Started on vancomycin with some mild improvement. Possible abscess formation s/p I&D. Cultures pending.   Assessment & Plan:   Principal Problem:   Cellulitis and abscess of buttock Active Problems:   HTN (hypertension)   AKI (acute kidney injury) (HCC)   Rheumatoid arthritis (HCC)   Cellulitis Left perineal and buttock area. Significantly tender. No purulent drainage. CT negative for abscess. Margins decreased with some increased fluctuance. [place] significant for fluid collection. Underwent I&D today without gross purulence. Cultures obtained. -Continue Vancomycin -Continue morphine prn -Norco prn -Warm compress -Will formally consult ID  Essential hypertension Hydrochlorothiazide and amlodipine held on admission -Continue amlodipine  Acute kidney injury Resolved.  Hyperlipidemia -Continue Crestor  Rheumatoid arthritis Patient is on Plaquenil as an outpatient but has discontinued herself.   DVT prophylaxis: Lovenox Code Status: Full code Family Communication: None at bedside Disposition Plan: Discharge home when medically stable   Consultants:   General surgery  Infectious disease  Procedures:   None  Antimicrobials:  Vancomycin    Subjective: S/p I&D this morning. Pain about the same.  Objective: Vitals:   12/02/17 0900 12/02/17 0915 12/02/17 0930 12/02/17 1005  BP: (!) 119/54 (!) 113/50 (!) 106/49 (!) 151/61  Pulse: (!) 51 (!) 50 (!) 52 (!) 55  Resp: 12 14 10 16   Temp:   97.6 F (36.4 C)   TempSrc:    Oral  SpO2: 99% 96% 100% 98%  Weight:      Height:        Intake/Output Summary (Last 24 hours) at  12/02/2017 1126 Last data filed at 12/02/2017 6283 Gross per 24 hour  Intake 500 ml  Output -  Net 500 ml   Filed Weights   11/28/17 2103  Weight: 70.2 kg (154 lb 12.2 oz)    Examination:  General exam: Appears calm and comfortable Respiratory system: Clear to auscultation. Respiratory effort normal. Cardiovascular system: S1 & S2 heard, RRR. No murmurs. Gastrointestinal system: Abdomen is nondistended, soft and nontender. Normal bowel sounds heard. Central nervous system: Alert and oriented. No focal neurological deficits. Extremities: No edema. No calf tenderness Skin: Dressing present Psychiatry: Judgement and insight appear normal. Mood & affect appropriate.     Data Reviewed: I have personally reviewed following labs and imaging studies  CBC: Recent Labs  Lab 11/28/17 1800 11/28/17 1819 11/29/17 0026 12/01/17 0933 12/02/17 1000  WBC 16.3*  --  13.9* 12.1* 8.5  NEUTROABS 10.9*  --   --   --   --   HGB 12.2 12.9 11.4* 11.6* 12.4  HCT 37.2 38.0 34.8* 34.6* 37.6  MCV 96.4  --  98.0 96.9 96.9  PLT 219  --  196 196 151   Basic Metabolic Panel: Recent Labs  Lab 11/28/17 1819 11/29/17 0026 12/01/17 0933 12/02/17 1000  NA 138 134* 138 138  K 4.7 4.3 3.7 4.1  CL 103 103 104 103  CO2  --  22 24 24   GLUCOSE 95 118* 99 121*  BUN 24* 18 12 11   CREATININE 1.50* 1.31* 0.81 0.77  CALCIUM  --  8.8* 9.2 9.4   GFR: Estimated Creatinine Clearance: 63.4 mL/min (by C-G formula based on SCr of 0.77 mg/dL). Liver Function  Tests: No results for input(s): AST, ALT, ALKPHOS, BILITOT, PROT, ALBUMIN in the last 168 hours. No results for input(s): LIPASE, AMYLASE in the last 168 hours. No results for input(s): AMMONIA in the last 168 hours. Coagulation Profile: No results for input(s): INR, PROTIME in the last 168 hours. Cardiac Enzymes: No results for input(s): CKTOTAL, CKMB, CKMBINDEX, TROPONINI in the last 168 hours. BNP (last 3 results) No results for input(s): PROBNP in  the last 8760 hours. HbA1C: No results for input(s): HGBA1C in the last 72 hours. CBG: No results for input(s): GLUCAP in the last 168 hours. Lipid Profile: No results for input(s): CHOL, HDL, LDLCALC, TRIG, CHOLHDL, LDLDIRECT in the last 72 hours. Thyroid Function Tests: No results for input(s): TSH, T4TOTAL, FREET4, T3FREE, THYROIDAB in the last 72 hours. Anemia Panel: No results for input(s): VITAMINB12, FOLATE, FERRITIN, TIBC, IRON, RETICCTPCT in the last 72 hours. Sepsis Labs: No results for input(s): PROCALCITON, LATICACIDVEN in the last 168 hours.  Recent Results (from the past 240 hour(s))  Urine culture     Status: None   Collection Time: 11/28/17  7:27 PM  Result Value Ref Range Status   Specimen Description URINE, RANDOM  Final   Special Requests NONE  Final   Culture NO GROWTH  Final   Report Status 11/30/2017 FINAL  Final  Surgical pcr screen     Status: None   Collection Time: 12/01/17  9:38 PM  Result Value Ref Range Status   MRSA, PCR NEGATIVE NEGATIVE Final   Staphylococcus aureus NEGATIVE NEGATIVE Final    Comment: (NOTE) The Xpert SA Assay (FDA approved for NASAL specimens in patients 11 years of age and older), is one component of a comprehensive surveillance program. It is not intended to diagnose infection nor to guide or monitor treatment.          Radiology Studies: [place] Rt Lower Extrem Ltd Soft Tissue Non Vascular  Result Date: 12/01/2017 CLINICAL DATA:  Cellulitis and possible abscess. EXAM: ULTRASOUND left LOWER EXTREMITY LIMITED TECHNIQUE: Ultrasound examination of the lower extremity soft tissues was performed in the area of clinical concern. COMPARISON:  CT pelvis 11/28/2017 IN THE AREA OF CONCERN ALONG THE LEFT BUTTOCK REGION THERE IS A 2.6 BY 0.6 BY 1.2 CM AREA OF HYPOECHOGENICITY FAVORING PHLEGMON OR EARLY ABSCESS.  OVERLYING INFILTRATIVE EDEMA IN THE SUBCUTANEOUS TISSUES OF THE LEFT PERINEUM.: IN THE AREA OF CONCERN ALONG THE LEFT BUTTOCK  REGION THERE IS A 2.6 BY 0.6 BY 1.2 CM AREA OF HYPOECHOGENICITY FAVORING PHLEGMON OR EARLY ABSCESS. OVERLYING INFILTRATIVE EDEMA IN THE SUBCUTANEOUS TISSUES OF THE LEFT PERINEUM. IMPRESSION: 1. Abnormal subcutaneous edema along the left perineum, with a somewhat confluent 2.6 by 0.6 by 1.2 cm (volume = 1 cm^3) area of hypoechogenicity favoring phlegmon or early abscess formation. Electronically Signed   By: Van Clines M.D.   On: 12/01/2017 11:03        Scheduled Meds: . acetaminophen  650 mg Oral Q6H  . amLODipine  10 mg Oral Daily  . aspirin  81 mg Oral Daily  . docusate sodium  100 mg Oral BID  . enoxaparin (LOVENOX) injection  40 mg Subcutaneous Q24H  . gabapentin  300 mg Oral BID  . HYDROmorphone      . pantoprazole  40 mg Oral Daily  . rosuvastatin  5 mg Oral Once per day on Mon Wed Fri   Continuous Infusions: . lactated ringers 50 mL/hr at 12/02/17 1025  . vancomycin 750 mg (12/02/17 1105)  LOS: 4 days     Cordelia Poche, MD [facility] 12/02/2017, 11:26 AM Pager: [phone]  If 7PM-7AM, please contact night-coverage [email or web address] Password [facility] 12/02/2017, 11:26 AM

## 2017-12-02 NOTE — Plan of Care (Signed)
Pt transfer to OR for I&D report given to Levada Dy, husband at bedside.

## 2017-12-02 NOTE — Progress Notes (Addendum)
Pharmacy Antibiotic Note  Courtney Crane is a 67 y.o. female admitted on 11/28/2017 with inner thigh cellulitis and abscess, s/p &D today 12/02/17.  Pharmacy has been consulted for vancomycin dosing (today is day #4 of therapy but no vancomycin trough yet d/t changing renal function.    Patient is afebrile and her WBC is trending down.  Her renal function is improving.   Plan: Change vanc to 750mg  IV Q12H Monitor renal fxn, clinical progress, vanc trough prior to 4th dose BMET in AM   Height: 5\' 2"  (157.5 cm) Weight: 154 lb 12.2 oz (70.2 kg) IBW/kg (Calculated) : 50.1  Temp (24hrs), Avg:98.3 F (36.8 C), Min:97.5 F (36.4 C), Max:99.8 F (37.7 C)  Recent Labs  Lab 11/28/17 1800 11/28/17 1819 11/29/17 0026 12/01/17 0933  WBC 16.3*  --  13.9* 12.1*  CREATININE  --  1.50* 1.31* 0.81    Estimated Creatinine Clearance: 62.7 mL/min (by C-G formula based on SCr of 0.81 mg/dL).    Allergies  Allergen Reactions  . Demerol [Meperidine] Nausea Only  . Tape Rash    Vanc 1/11 >>  1/10 UCx - negative   Madelina Sanda D. Mina Marble, PharmD, BCPS Pager:  [phone] 12/02/2017, 10:29 AM

## 2017-12-03 ENCOUNTER — Encounter (HOSPITAL_COMMUNITY): Payer: Self-pay | Admitting: General Surgery

## 2017-12-03 LAB — BASIC METABOLIC PANEL
Anion gap: 11 (ref 5–15)
BUN: 13 mg/dL (ref 6–20)
CALCIUM: 9 mg/dL (ref 8.9–10.3)
CO2: 24 mmol/L (ref 22–32)
CREATININE: 0.69 mg/dL (ref 0.44–1.00)
Chloride: 102 mmol/L (ref 101–111)
Glucose, Bld: 107 mg/dL — ABNORMAL HIGH (ref 65–99)
Potassium: 3.6 mmol/L (ref 3.5–5.1)
SODIUM: 137 mmol/L (ref 135–145)

## 2017-12-03 LAB — CBC
HCT: 32.3 % — ABNORMAL LOW (ref 36.0–46.0)
Hemoglobin: 10.6 g/dL — ABNORMAL LOW (ref 12.0–15.0)
MCH: 31.5 pg (ref 26.0–34.0)
MCHC: 32.8 g/dL (ref 30.0–36.0)
MCV: 96.1 fL (ref 78.0–100.0)
PLATELETS: 215 10*3/uL (ref 150–400)
RBC: 3.36 MIL/uL — AB (ref 3.87–5.11)
RDW: 14.1 % (ref 11.5–15.5)
WBC: 17.2 10*3/uL — AB (ref 4.0–10.5)

## 2017-12-03 NOTE — Progress Notes (Signed)
Patient ID: Courtney Crane, female   DOB: 1950/12/22, 67 y.o.   MRN: [id number]   Acute Care Surgery Service Progress Note:    Chief Complaint/Subjective: Pt seen & examined mid morning Sore, slept ok No n/v Ambulated some  Objective: Vital signs in last 24 hours: Temp:  [97.9 F (36.6 C)-98.4 F (36.9 C)] 98.4 F (36.9 C) (01/15 2206) Pulse Rate:  [52-62] 53 (01/15 2206) Resp:  [16] 16 (01/15 2206) BP: (120-149)/(48-61) 120/48 (01/15 2206) SpO2:  [97 %-100 %] 98 % (01/15 2206) Last BM Date: 12/01/17  Intake/Output from previous day: 01/14 0701 - 01/15 0700 In: 1764.2 [P.O.:835; I.V.:929.2] Out: -  Intake/Output this shift: Total I/O In: 122 [P.O.:122] Out: -   Lungs:  nonlabored  Cardiovascular: reg  Abd: deferred  Buttocks:  Still with fair amount cellulitis, packing in place but induration is def better  Extremities: no edema, +SCDs  Neuro: alert, nonfocal  Lab Results: CBC  Recent Labs    12/02/17 1000 12/03/17 0819  WBC 8.5 17.2*  HGB 12.4 10.6*  HCT 37.6 32.3*  PLT 221 215   BMET Recent Labs    12/02/17 1000 12/03/17 0819  NA 138 137  K 4.1 3.6  CL 103 102  CO2 24 24  GLUCOSE 121* 107*  BUN 11 13  CREATININE 0.77 0.69  CALCIUM 9.4 9.0   LFT Hepatic Function Latest Ref Rng & Units 10/01/2017 01/01/2017  Total Protein 6.0 - 8.3 g/dL 8.0 7.9  Albumin 3.5 - 5.2 g/dL 4.1 4.5  AST 0 - 37 U/L 46(H) 26  ALT 0 - 35 U/L 85(H) 39(H)  Alk Phosphatase 39 - 117 U/L 56 58  Total Bilirubin 0.2 - 1.2 mg/dL 0.3 0.4   PT/INR No results for input(s): LABPROT, INR in the last 72 hours. ABG No results for input(s): PHART, HCO3 in the last 72 hours.  Invalid input(s): PCO2, PO2  Studies/Results:  Anti-infectives: Anti-infectives (From admission, onward)   Start     Dose/Rate Route Frequency Ordered Stop   12/02/17 1100  vancomycin (VANCOCIN) IVPB 750 mg/150 ml premix     750 mg 150 mL/hr over 60 Minutes Intravenous Every 12 hours 12/02/17  1027     12/01/17 0800  vancomycin (VANCOCIN) IVPB 750 mg/150 ml premix  Status:  Discontinued     750 mg 150 mL/hr over 60 Minutes Intravenous Every 24 hours 11/30/17 1515 12/02/17 1027   11/30/17 0800  vancomycin (VANCOCIN) 1,250 mg in sodium chloride 0.9 % 250 mL IVPB  Status:  Discontinued     1,250 mg 166.7 mL/hr over 90 Minutes Intravenous Every 24 hours 11/29/17 0715 11/30/17 1515   11/29/17 2130  vancomycin (VANCOCIN) 1,250 mg in sodium chloride 0.9 % 250 mL IVPB  Status:  Discontinued     1,250 mg 166.7 mL/hr over 90 Minutes Intravenous Every 24 hours 11/28/17 2104 11/29/17 0715   11/29/17 0800  vancomycin (VANCOCIN) 1,500 mg in sodium chloride 0.9 % 500 mL IVPB     1,500 mg 250 mL/hr over 120 Minutes Intravenous  Once 11/29/17 0715 11/29/17 1005   11/28/17 2100  vancomycin (VANCOCIN) 1,500 mg in sodium chloride 0.9 % 500 mL IVPB  Status:  Discontinued     1,500 mg 250 mL/hr over 120 Minutes Intravenous  Once 11/28/17 2055 11/29/17 0718      Medications: Scheduled Meds: . acetaminophen  650 mg Oral Q6H  . amLODipine  10 mg Oral Daily  . aspirin  81 mg Oral Daily  .  docusate sodium  100 mg Oral BID  . enoxaparin (LOVENOX) injection  40 mg Subcutaneous Q24H  . gabapentin  300 mg Oral BID  . pantoprazole  40 mg Oral Daily  . rosuvastatin  5 mg Oral Once per day on Mon Wed Fri   Continuous Infusions: . vancomycin 750 mg (12/03/17 2213)   PRN Meds:.Melatonin, morphine injection, ondansetron **OR** ondansetron (ZOFRAN) IV, oxyCODONE, polyethylene glycol  Assessment/Plan: Patient Active Problem List   Diagnosis Date Noted  . AKI (acute kidney injury) ([place]) 11/29/2017  . Rheumatoid arthritis ([place]) 11/29/2017  . Cellulitis and abscess of buttock 11/28/2017  . 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 History of shingles 06/06/2016  . Genital herpes simplex 06/06/2016  . Seasonal allergies 06/06/2016  . Seborrheic dermatitis   . Prediabetes   . Anemia   .  GERD (gastroesophageal reflux disease)   . Vitamin D deficiency   . Insomnia, unspecified   . HTN (hypertension)    s/p Procedure(s): IRRIGATION AND DEBRIDEMENT PERINEAL ABSCESS EXAM UNDER ANESTHESIA 12/02/2017  F/u OR cultures Remove packing today May shower Wick wound  abx per ID  Disposition:  LOS: 5 days    Leighton Ruff. Redmond Pulling, MD, FACS General, Bariatric, & Minimally Invasive Surgery [phone] [facility]

## 2017-12-03 NOTE — Progress Notes (Signed)
PROGRESS NOTE    Courtney Crane  WSF:[id number] DOB: [birth year] DOA: 11/28/2017 PCP: Velna Hatchet, MD   Brief Narrative: Courtney Crane is a 67 y.o. female with medical history significant of IBS; HTN; HLD: and RA on hydroxychloroquine. Patient presented after failing outpatient treatment for cellulitis of her perineal area. Started on vancomycin with some mild improvement. Possible abscess formation s/p I&D. Cultures pending.   Assessment & Plan:   Principal Problem:   Cellulitis and abscess of buttock Active Problems:   HTN (hypertension)   AKI (acute kidney injury) (HCC)   Rheumatoid arthritis (HCC)   Cellulitis Left perineal and buttock area. Significantly tender. No purulent drainage. CT negative for abscess. Margins decreased with some increased fluctuance. [place] significant for fluid collection. Underwent I&D on 1/14 without gross purulence. Cultures obtained. -Continue Vancomycin -Continue morphine prn -Norco prn -ID recommendations -wound culture  Essential hypertension Hydrochlorothiazide held secondary to AKI on admission -Continue amlodipine  Acute kidney injury Resolved.  Hyperlipidemia -Continue Crestor  Rheumatoid arthritis Patient is on Plaquenil as an outpatient but has discontinued herself.  Leukocytosis S/p surgery. Afebrile. Infection appears improved. Doubt this is infectious. -repeat CBC in AM. If fevers, will obtain blood culture   DVT prophylaxis: Lovenox Code Status: Full code Family Communication: None at bedside Disposition Plan: Discharge home when medically stable   Consultants:   General surgery  Infectious disease  Procedures:   None  Antimicrobials:  Vancomycin    Subjective: Incisional pain. Afebrile.  Objective: Vitals:   12/02/17 1005 12/02/17 1323 12/02/17 2233 12/03/17 0609  BP: (!) 151/61 (!) 121/46 (!) 129/51 (!) 124/57  Pulse: (!) 55 65 62 (!) 52  Resp: 16 18 17 16   Temp:  98.2 F (36.8 C) 97.8  F (36.6 C) 98.2 F (36.8 C)  TempSrc: Oral Oral Oral Oral  SpO2: 98% 96% 95% 97%  Weight:      Height:        Intake/Output Summary (Last 24 hours) at 12/03/2017 1141 Last data filed at 12/03/2017 0174 Gross per 24 hour  Intake 1264.17 ml  Output -  Net 1264.17 ml   Filed Weights   11/28/17 2103  Weight: 70.2 kg (154 lb 12.2 oz)    Examination:  General exam: Appears calm and comfortable Respiratory system: Clear to auscultation. Respiratory effort normal. Cardiovascular system: S1 & S2 heard, RRR. No murmurs. Gastrointestinal system: Abdomen is nondistended, soft and nontender. Normal bowel sounds heard. Central nervous system: Alert and oriented. No focal neurological deficits. Extremities: No edema. No calf tenderness Skin: Erythema looks improved. Original margins have been removed secondary to recent procedure Psychiatry: Judgement and insight appear normal. Mood & affect appropriate.     Data Reviewed: I have personally reviewed following labs and imaging studies  CBC: Recent Labs  Lab 11/28/17 1800 11/28/17 1819 11/29/17 0026 12/01/17 0933 12/02/17 1000 12/03/17 0819  WBC 16.3*  --  13.9* 12.1* 8.5 17.2*  NEUTROABS 10.9*  --   --   --   --   --   HGB 12.2 12.9 11.4* 11.6* 12.4 10.6*  HCT 37.2 38.0 34.8* 34.6* 37.6 32.3*  MCV 96.4  --  98.0 96.9 96.9 96.1  PLT 219  --  196 196 221 944   Basic Metabolic Panel: Recent Labs  Lab 11/28/17 1819 11/29/17 0026 12/01/17 0933 12/02/17 1000 12/03/17 0819  NA 138 134* 138 138 137  K 4.7 4.3 3.7 4.1 3.6  CL 103 103 104 103 102  CO2  --  22 24  24 24  GLUCOSE 95 118* 99 121* 107*  BUN 24* 18 12 11 13   CREATININE 1.50* 1.31* 0.81 0.77 0.69  CALCIUM  --  8.8* 9.2 9.4 9.0   GFR: Estimated Creatinine Clearance: 63.4 mL/min (by C-G formula based on SCr of 0.69 mg/dL). Liver Function Tests: No results for input(s): AST, ALT, ALKPHOS, BILITOT, PROT, ALBUMIN in the last 168 hours. No results for input(s):  LIPASE, AMYLASE in the last 168 hours. No results for input(s): AMMONIA in the last 168 hours. Coagulation Profile: No results for input(s): INR, PROTIME in the last 168 hours. Cardiac Enzymes: No results for input(s): CKTOTAL, CKMB, CKMBINDEX, TROPONINI in the last 168 hours. BNP (last 3 results) No results for input(s): PROBNP in the last 8760 hours. HbA1C: No results for input(s): HGBA1C in the last 72 hours. CBG: No results for input(s): GLUCAP in the last 168 hours. Lipid Profile: No results for input(s): CHOL, HDL, LDLCALC, TRIG, CHOLHDL, LDLDIRECT in the last 72 hours. Thyroid Function Tests: No results for input(s): TSH, T4TOTAL, FREET4, T3FREE, THYROIDAB in the last 72 hours. Anemia Panel: No results for input(s): VITAMINB12, FOLATE, FERRITIN, TIBC, IRON, RETICCTPCT in the last 72 hours. Sepsis Labs: No results for input(s): PROCALCITON, LATICACIDVEN in the last 168 hours.  Recent Results (from the past 240 hour(s))  Urine culture     Status: None   Collection Time: 11/28/17  7:27 PM  Result Value Ref Range Status   Specimen Description URINE, RANDOM  Final   Special Requests NONE  Final   Culture NO GROWTH  Final   Report Status 11/30/2017 FINAL  Final  Surgical pcr screen     Status: None   Collection Time: 12/01/17  9:38 PM  Result Value Ref Range Status   MRSA, PCR NEGATIVE NEGATIVE Final   Staphylococcus aureus NEGATIVE NEGATIVE Final    Comment: (NOTE) The Xpert SA Assay (FDA approved for NASAL specimens in patients 77 years of age and older), is one component of a comprehensive surveillance program. It is not intended to diagnose infection nor to guide or monitor treatment.   Aerobic/Anaerobic Culture (surgical/deep wound)     Status: None (Preliminary result)   Collection Time: 12/02/17  8:03 AM  Result Value Ref Range Status   Specimen Description ABSCESS  Final   Special Requests PERIRECTAL  Final   Gram Stain   Final    RARE WBC PRESENT, PREDOMINANTLY  PMN RARE GRAM POSITIVE COCCI IN CLUSTERS    Culture PENDING  Incomplete   Report Status PENDING  Incomplete         Radiology Studies: No results found.      Scheduled Meds: . acetaminophen  650 mg Oral Q6H  . amLODipine  10 mg Oral Daily  . aspirin  81 mg Oral Daily  . docusate sodium  100 mg Oral BID  . enoxaparin (LOVENOX) injection  40 mg Subcutaneous Q24H  . gabapentin  300 mg Oral BID  . pantoprazole  40 mg Oral Daily  . rosuvastatin  5 mg Oral Once per day on Mon Wed Fri   Continuous Infusions: . lactated ringers 50 mL/hr at 12/03/17 0644  . vancomycin 750 mg (12/03/17 1020)     LOS: 5 days     Cordelia Poche, MD [facility] 12/03/2017, 11:41 AM Pager: [phone]  If 7PM-7AM, please contact night-coverage [email or web address] Password [facility] 12/03/2017, 11:41 AM

## 2017-12-03 NOTE — Progress Notes (Signed)
Pt ambulated 4 laps along the hall way.

## 2017-12-03 NOTE — Progress Notes (Signed)
Patient ID: Courtney Crane, female   DOB: 06/20/1951, 67 y.o.   MRN: [id number]          [facility]  Date of Admission:  11/28/2017   Total days of antibiotics 11        Day 5 vancomycin         ASSESSMENT: She is improving clinically.  Operative Gram stain shows gram-positive cocci in clusters.  Cultures are pending.  I will continue vancomycin for now.  PLAN: 1. Continue vancomycin pending culture results  Principal Problem:   Cellulitis and abscess of buttock Active Problems:   HTN (hypertension)   AKI (acute kidney injury) (HCC)   Rheumatoid arthritis (HCC)   Scheduled Meds: . acetaminophen  650 mg Oral Q6H  . amLODipine  10 mg Oral Daily  . aspirin  81 mg Oral Daily  . docusate sodium  100 mg Oral BID  . enoxaparin (LOVENOX) injection  40 mg Subcutaneous Q24H  . gabapentin  300 mg Oral BID  . pantoprazole  40 mg Oral Daily  . rosuvastatin  5 mg Oral Once per day on Mon Wed Fri   Continuous Infusions: . lactated ringers 50 mL/hr at 12/03/17 0644  . vancomycin Stopped (12/03/17 1204)   PRN Meds:.Melatonin, morphine injection, ondansetron **OR** ondansetron (ZOFRAN) IV, oxyCODONE, polyethylene glycol   SUBJECTIVE: Overall she is feeling better.  Review of Systems: Review of Systems  Constitutional: Negative for chills, diaphoresis and fever.  Gastrointestinal: Negative for abdominal pain, diarrhea, nausea and vomiting.    Allergies  Allergen Reactions  . Demerol [Meperidine] Nausea Only  . Tape Rash    OBJECTIVE: Vitals:   12/02/17 1005 12/02/17 1323 12/02/17 2233 12/03/17 0609  BP: (!) 151/61 (!) 121/46 (!) 129/51 (!) 124/57  Pulse: (!) 55 65 62 (!) 52  Resp: 16 18 17 16   Temp:  98.2 F (36.8 C) 97.8 F (36.6 C) 98.2 F (36.8 C)  TempSrc: Oral Oral Oral Oral  SpO2: 98% 96% 95% 97%  Weight:      Height:       Body mass index is 28.31 kg/m.  Physical Exam  Constitutional: No distress.  Cardiovascular: Normal  rate and regular rhythm.  No murmur heard. Pulmonary/Chest: Effort normal and breath sounds normal.  Abdominal: Soft. She exhibits no distension. There is no tenderness.  Decrease erythema and swelling    Lab Results Lab Results  Component Value Date   WBC 17.2 (H) 12/03/2017   HGB 10.6 (L) 12/03/2017   HCT 32.3 (L) 12/03/2017   MCV 96.1 12/03/2017   PLT 215 12/03/2017    Lab Results  Component Value Date   CREATININE 0.69 12/03/2017   BUN 13 12/03/2017   NA 137 12/03/2017   K 3.6 12/03/2017   CL 102 12/03/2017   CO2 24 12/03/2017    Lab Results  Component Value Date   ALT 85 (H) 10/01/2017   AST 46 (H) 10/01/2017   ALKPHOS 56 10/01/2017   BILITOT 0.3 10/01/2017     Microbiology: Recent Results (from the past 240 hour(s))  Urine culture     Status: None   Collection Time: 11/28/17  7:27 PM  Result Value Ref Range Status   Specimen Description URINE, RANDOM  Final   Special Requests NONE  Final   Culture NO GROWTH  Final   Report Status 11/30/2017 FINAL  Final  Surgical pcr screen     Status: None   Collection Time: 12/01/17  9:38 PM  Result Value Ref Range Status   MRSA, PCR NEGATIVE NEGATIVE Final   Staphylococcus aureus NEGATIVE NEGATIVE Final    Comment: (NOTE) The Xpert SA Assay (FDA approved for NASAL specimens in patients 75 years of age and older), is one component of a comprehensive surveillance program. It is not intended to diagnose infection nor to guide or monitor treatment.   Aerobic/Anaerobic Culture (surgical/deep wound)     Status: None (Preliminary result)   Collection Time: 12/02/17  8:03 AM  Result Value Ref Range Status   Specimen Description ABSCESS  Final   Special Requests PERIRECTAL  Final   Gram Stain   Final    RARE WBC PRESENT, PREDOMINANTLY PMN RARE GRAM POSITIVE COCCI IN CLUSTERS    Culture PENDING  Incomplete   Report Status PENDING  Incomplete    Michel Bickers, MD [facility] [phone] pager   [phone] 12/03/2017, 12:24 PM

## 2017-12-04 DIAGNOSIS — R7303 Prediabetes: Secondary | ICD-10-CM

## 2017-12-04 DIAGNOSIS — K219 Gastro-esophageal reflux disease without esophagitis: Secondary | ICD-10-CM

## 2017-12-04 DIAGNOSIS — D72829 Elevated white blood cell count, unspecified: Secondary | ICD-10-CM

## 2017-12-04 DIAGNOSIS — L0390 Cellulitis, unspecified: Secondary | ICD-10-CM

## 2017-12-04 DIAGNOSIS — L0291 Cutaneous abscess, unspecified: Secondary | ICD-10-CM

## 2017-12-04 HISTORY — DX: Elevated white blood cell count, unspecified: D72.829

## 2017-12-04 LAB — PHOSPHORUS: PHOSPHORUS: 4.2 mg/dL (ref 2.5–4.6)

## 2017-12-04 LAB — COMPREHENSIVE METABOLIC PANEL
ALK PHOS: 100 U/L (ref 38–126)
ALT: 107 U/L — ABNORMAL HIGH (ref 14–54)
ANION GAP: 6 (ref 5–15)
AST: 54 U/L — ABNORMAL HIGH (ref 15–41)
Albumin: 2.9 g/dL — ABNORMAL LOW (ref 3.5–5.0)
BUN: 12 mg/dL (ref 6–20)
CALCIUM: 8.7 mg/dL — AB (ref 8.9–10.3)
CO2: 27 mmol/L (ref 22–32)
Chloride: 108 mmol/L (ref 101–111)
Creatinine, Ser: 0.77 mg/dL (ref 0.44–1.00)
GFR calc non Af Amer: >60 mL/min (ref >60)
Glucose, Bld: 96 mg/dL (ref 65–99)
Potassium: 3.9 mmol/L (ref 3.5–5.1)
SODIUM: 141 mmol/L (ref 135–145)
Total Bilirubin: 0.6 mg/dL (ref 0.3–1.2)
Total Protein: 6.7 g/dL (ref 6.5–8.1)

## 2017-12-04 LAB — VANCOMYCIN, TROUGH: VANCOMYCIN TR: 13 ug/mL — AB (ref 15–20)

## 2017-12-04 LAB — CBC
HEMATOCRIT: 31.9 % — AB (ref 36.0–46.0)
Hemoglobin: 10.4 g/dL — ABNORMAL LOW (ref 12.0–15.0)
MCH: 31.9 pg (ref 26.0–34.0)
MCHC: 32.6 g/dL (ref 30.0–36.0)
MCV: 97.9 fL (ref 78.0–100.0)
Platelets: 213 10*3/uL (ref 150–400)
RBC: 3.26 MIL/uL — ABNORMAL LOW (ref 3.87–5.11)
RDW: 14.6 % (ref 11.5–15.5)
WBC: 9.9 10*3/uL (ref 4.0–10.5)

## 2017-12-04 LAB — MAGNESIUM: Magnesium: 2 mg/dL (ref 1.7–2.4)

## 2017-12-04 MED ORDER — VANCOMYCIN HCL IN DEXTROSE 1-5 GM/200ML-% IV SOLN
1000.0000 mg | Freq: Two times a day (BID) | INTRAVENOUS | Status: DC
  Administered 2017-12-04 – 2017-12-05 (×2): 1000 mg via INTRAVENOUS
  Filled 2017-12-04 (×3): qty 200

## 2017-12-04 NOTE — Progress Notes (Signed)
PROGRESS NOTE    Courtney Crane  AYT:[id number] DOB: 1951/10/26 DOA: 11/28/2017 PCP: Velna Hatchet, MD   Brief Narrative:  Courtney Crane is a 67 y.o. female with medical history significant ofIBS; HTN; HLD: and RA previously on hydroxychloroquine and other comorbids. Patient presented after failing outpatient treatment for cellulitis of her perineal area. Started on vancomycin with some mild improvement. Possible abscess formation s/p I&D. Cultures currently pending. ID and General Surgery following.  Assessment & Plan:   Principal Problem:   Cellulitis and abscess of buttock Active Problems:   Anemia   GERD (gastroesophageal reflux disease)   HTN (hypertension)   AKI (acute kidney injury) (HCC)   Rheumatoid arthritis (HCC)   Leukocytosis  Cellulitis with Left Perianal Abscess s/p I and D POD 2 -Left perineal and buttock area. Significantly tender. No purulent drainage.  -CT negative for abscess. Margins decreased with some increased fluctuance.  -[place] significant for fluid collection.  -General Surgery Consulted -Underwent I&D 11/14 without gross purulence. Cultures obtained and pending  -Continue Vancomycin for now per ID -Gram St -C/w Bowel Regimen with Miralax 17 grams po Dailyprn and Docusate 100 mg po BID -Cellulitis is improving. -General Surgery recommending home wound care and daily dressing changes and as needed for soilage; Per Surgery continue to wick the wound with gauze to prevent skin from closing until deeper layer has granulated.   Essential Hypertension -Hydrochlorothiazide and Amlodipine held on admission -Continue Amlodipine 10 mg po Daily   Acute Kidney Injury -Resolved. BUN/Cr not done this AM but yesterday was 13/0.69  Hyperlipidemia -Continue Rosuvastatin 5 mg po 3 times weekly (M/W/F)  Rheumatoid Arthritis -Patient is on Plaquenil as an outpatient but has discontinued herself. -Follow up with Rheumatology as an outpatient -Pain Control  with Acetaminophen 650 mg po q6h, Oxycodone IR 5-10 mg po q4hprn, and IV Morphine 1-3 mg IV q1hprn Severe Pain   Normocytic Anemia -Patient's Hb/Hct went from 10.6/32.3 -> 10.4/31.9 -Continue to Monitor for S/Sx of Bleeding -Repeat CBC in AM  Leukocytosis -Improved. WBC went from 17.2 -> 9.9 -Continue to Monitor for S/Sx of of Infection; I and D -Repeat CBC in AM  IFG/Prediabetes -Blood Sugars been ranging from 95-121 -If consistently elevated will add Novolog SSI Insulin  DVT prophylaxis: Enoxaparin 40 mg sq q24h Code Status: FULL CODE Family Communication: Discussed with Husband at bedside Disposition Plan: Remain Inpatient for continued Treatment and anticipate D/C within next 24-48 hours once sensitivities have resulted   Consultants:  Infectious Diseases General Surgery    Procedures:  I&D of Left Perianal Abscess and Cellulitis    Antimicrobials: Anti-infectives (From admission, onward)   Start     Dose/Rate Route Frequency Ordered Stop   12/02/17 1100  vancomycin (VANCOCIN) IVPB 750 mg/150 ml premix     750 mg 150 mL/hr over 60 Minutes Intravenous Every 12 hours 12/02/17 1027     12/01/17 0800  vancomycin (VANCOCIN) IVPB 750 mg/150 ml premix  Status:  Discontinued     750 mg 150 mL/hr over 60 Minutes Intravenous Every 24 hours 11/30/17 1515 12/02/17 1027   11/30/17 0800  vancomycin (VANCOCIN) 1,250 mg in sodium chloride 0.9 % 250 mL IVPB  Status:  Discontinued     1,250 mg 166.7 mL/hr over 90 Minutes Intravenous Every 24 hours 11/29/17 0715 11/30/17 1515   11/29/17 2130  vancomycin (VANCOCIN) 1,250 mg in sodium chloride 0.9 % 250 mL IVPB  Status:  Discontinued     1,250 mg 166.7 mL/hr over 90 Minutes Intravenous  Every 24 hours 11/28/17 2104 11/29/17 0715   11/29/17 0800  vancomycin (VANCOCIN) 1,500 mg in sodium chloride 0.9 % 500 mL IVPB     1,500 mg 250 mL/hr over 120 Minutes Intravenous  Once 11/29/17 0715 11/29/17 1005   11/28/17 2100  vancomycin (VANCOCIN)  1,500 mg in sodium chloride 0.9 % 500 mL IVPB  Status:  Discontinued     1,500 mg 250 mL/hr over 120 Minutes Intravenous  Once 11/28/17 2055 11/29/17 0718     Subjective: Seen and examined at bedside and states her pain is better. No nausea or vomiting. States she has incisional pain but cellulitic pain has improved. No CP or SOB.   Objective: Vitals:   12/03/17 0609 12/03/17 1337 12/03/17 2206 12/04/17 0554  BP: (!) 124/57 (!) 149/61 (!) 120/48 (!) 134/56  Pulse: (!) 52 62 (!) 53 (!) 53  Resp: 16 16 16 16   Temp: 98.2 F (36.8 C) 97.9 F (36.6 C) 98.4 F (36.9 C) 97.9 F (36.6 C)  TempSrc: Oral Oral Oral Oral  SpO2: 97% 100% 98% 95%  Weight:      Height:        Intake/Output Summary (Last 24 hours) at 12/04/2017 1259 Last data filed at 12/04/2017 0845 Gross per 24 hour  Intake 722 ml  Output -  Net 722 ml   Filed Weights   11/28/17 2103  Weight: 70.2 kg (154 lb 12.2 oz)   Examination: Physical Exam:  Constitutional: NAD and appears calm and comfortable Eyes: Lids and conjunctivae normal, sclerae anicteric  ENMT: External Ears, Nose appear normal. Grossly normal hearing. Mucous membranes are moist. Neck: Appears normal, supple, no cervical masses, normal ROM, no appreciable thyromegaly, no JVD Respiratory: Clear to auscultation bilaterally, no wheezing, rales, rhonchi or crackles. Normal respiratory effort and patient is not tachypenic. No accessory muscle use.  Cardiovascular: RRR, no murmurs / rubs / gallops. S1 and S2 auscultated. No extremity edema. Abdomen: Soft, non-tender, non-distended. No masses palpated. No appreciable hepatosplenomegaly. Bowel sounds positive.  GU: Deferred. Musculoskeletal: No clubbing / cyanosis of digits/nails. No joint deformity upper and lower extremities Skin: Has Left buttock cellulitis with incision; Has erythema but reduced and not as warm to touch.  Neurologic: CN 2-12 grossly intact with no focal deficits. Strength 5/5 in all 4.  Romberg sign cerebellar reflexes not assessed. n  Psychiatric: Normal judgment and insight. Alert and oriented x 3. Normal mood and appropriate affect.   Data Reviewed: I have personally reviewed following labs and imaging studies  CBC: Recent Labs  Lab 11/28/17 1800  11/29/17 0026 12/01/17 0933 12/02/17 1000 12/03/17 0819 12/04/17 0423  WBC 16.3*  --  13.9* 12.1* 8.5 17.2* 9.9  NEUTROABS 10.9*  --   --   --   --   --   --   HGB 12.2   < > 11.4* 11.6* 12.4 10.6* 10.4*  HCT 37.2   < > 34.8* 34.6* 37.6 32.3* 31.9*  MCV 96.4  --  98.0 96.9 96.9 96.1 97.9  PLT 219  --  196 196 221 215 213   < > = values in this interval not displayed.   Basic Metabolic Panel: Recent Labs  Lab 11/28/17 1819 11/29/17 0026 12/01/17 0933 12/02/17 1000 12/03/17 0819  NA 138 134* 138 138 137  K 4.7 4.3 3.7 4.1 3.6  CL 103 103 104 103 102  CO2  --  22 24 24 24   GLUCOSE 95 118* 99 121* 107*  BUN 24* 18 12  11 13  CREATININE 1.50* 1.31* 0.81 0.77 0.69  CALCIUM  --  8.8* 9.2 9.4 9.0   GFR: Estimated Creatinine Clearance: 63.4 mL/min (by C-G formula based on SCr of 0.69 mg/dL). Liver Function Tests: No results for input(s): AST, ALT, ALKPHOS, BILITOT, PROT, ALBUMIN in the last 168 hours. No results for input(s): LIPASE, AMYLASE in the last 168 hours. No results for input(s): AMMONIA in the last 168 hours. Coagulation Profile: No results for input(s): INR, PROTIME in the last 168 hours. Cardiac Enzymes: No results for input(s): CKTOTAL, CKMB, CKMBINDEX, TROPONINI in the last 168 hours. BNP (last 3 results) No results for input(s): PROBNP in the last 8760 hours. HbA1C: No results for input(s): HGBA1C in the last 72 hours. CBG: No results for input(s): GLUCAP in the last 168 hours. Lipid Profile: No results for input(s): CHOL, HDL, LDLCALC, TRIG, CHOLHDL, LDLDIRECT in the last 72 hours. Thyroid Function Tests: No results for input(s): TSH, T4TOTAL, FREET4, T3FREE, THYROIDAB in the last 72  hours. Anemia Panel: No results for input(s): VITAMINB12, FOLATE, FERRITIN, TIBC, IRON, RETICCTPCT in the last 72 hours. Sepsis Labs: No results for input(s): PROCALCITON, LATICACIDVEN in the last 168 hours.  Recent Results (from the past 240 hour(s))  Urine culture     Status: None   Collection Time: 11/28/17  7:27 PM  Result Value Ref Range Status   Specimen Description URINE, RANDOM  Final   Special Requests NONE  Final   Culture NO GROWTH  Final   Report Status 11/30/2017 FINAL  Final  Surgical pcr screen     Status: None   Collection Time: 12/01/17  9:38 PM  Result Value Ref Range Status   MRSA, PCR NEGATIVE NEGATIVE Final   Staphylococcus aureus NEGATIVE NEGATIVE Final    Comment: (NOTE) The Xpert SA Assay (FDA approved for NASAL specimens in patients 4 years of age and older), is one component of a comprehensive surveillance program. It is not intended to diagnose infection nor to guide or monitor treatment.   Aerobic/Anaerobic Culture (surgical/deep wound)     Status: None (Preliminary result)   Collection Time: 12/02/17  8:03 AM  Result Value Ref Range Status   Specimen Description ABSCESS  Final   Special Requests PERIRECTAL  Final   Gram Stain   Final    RARE WBC PRESENT, PREDOMINANTLY PMN RARE GRAM POSITIVE COCCI IN CLUSTERS    Culture RARE STAPHYLOCOCCUS AUREUS  Final   Report Status PENDING  Incomplete     Radiology Studies: No results found.  Scheduled Meds: . acetaminophen  650 mg Oral Q6H  . amLODipine  10 mg Oral Daily  . aspirin  81 mg Oral Daily  . docusate sodium  100 mg Oral BID  . enoxaparin (LOVENOX) injection  40 mg Subcutaneous Q24H  . gabapentin  300 mg Oral BID  . pantoprazole  40 mg Oral Daily  . rosuvastatin  5 mg Oral Once per day on Mon Wed Fri   Continuous Infusions: . vancomycin 750 mg (12/04/17 1103)    LOS: 6 days   Kerney Elbe, DO [facility] Pager [phone]  If 7PM-7AM, please contact  night-coverage [email or web address] Password [facility] 12/04/2017, 12:59 PM

## 2017-12-04 NOTE — Progress Notes (Signed)
[facility] Progress Note  2 Days Post-Op  Subjective: CC: perianal abscess Patient with some questions about home dressing changes. Pain improving.  VSS.   Objective: Vital signs in last 24 hours: Temp:  [97.9 F (36.6 C)-98.4 F (36.9 C)] 97.9 F (36.6 C) (01/16 0554) Pulse Rate:  [53-62] 53 (01/16 0554) Resp:  [16] 16 (01/16 0554) BP: (120-149)/(48-61) 134/56 (01/16 0554) SpO2:  [95 %-100 %] 95 % (01/16 0554) Last BM Date: 12/03/17  Intake/Output from previous day: 01/15 0701 - 01/16 0700 In: 482 [P.O.:482] Out: -  Intake/Output this shift: Total I/O In: 240 [P.O.:240] Out: -   PE: Gen:  Alert, NAD, pleasant Card: pedal pulses 2+ BL Pulm:  Normal effort,  Abd: Soft, non-tender, non-distended GU: wound granulating, surrounding erythema and induration improving Skin: warm and dry, no rashes  Psych: A&Ox3   Lab Results:  Recent Labs    12/03/17 0819 12/04/17 0423  WBC 17.2* 9.9  HGB 10.6* 10.4*  HCT 32.3* 31.9*  PLT 215 213   BMET Recent Labs    12/02/17 1000 12/03/17 0819  NA 138 137  K 4.1 3.6  CL 103 102  CO2 24 24  GLUCOSE 121* 107*  BUN 11 13  CREATININE 0.77 0.69  CALCIUM 9.4 9.0   PT/INR No results for input(s): LABPROT, INR in the last 72 hours. CMP     Component Value Date/Time   NA 137 12/03/2017 0819   K 3.6 12/03/2017 0819   CL 102 12/03/2017 0819   CO2 24 12/03/2017 0819   GLUCOSE 107 (H) 12/03/2017 0819   BUN 13 12/03/2017 0819   CREATININE 0.69 12/03/2017 0819   CALCIUM 9.0 12/03/2017 0819   PROT 8.0 10/01/2017 1626   ALBUMIN 4.1 10/01/2017 1626   AST 46 (H) 10/01/2017 1626   ALT 85 (H) 10/01/2017 1626   ALKPHOS 56 10/01/2017 1626   BILITOT 0.3 10/01/2017 1626   GFRNONAA >60 12/03/2017 0819   GFRAA >60 12/03/2017 0819   Lipase  No results found for: LIPASE     Studies/Results: No results found.  Anti-infectives: Anti-infectives (From admission, onward)   Start     Dose/Rate Route Frequency  Ordered Stop   12/02/17 1100  vancomycin (VANCOCIN) IVPB 750 mg/150 ml premix     750 mg 150 mL/hr over 60 Minutes Intravenous Every 12 hours 12/02/17 1027     12/01/17 0800  vancomycin (VANCOCIN) IVPB 750 mg/150 ml premix  Status:  Discontinued     750 mg 150 mL/hr over 60 Minutes Intravenous Every 24 hours 11/30/17 1515 12/02/17 1027   11/30/17 0800  vancomycin (VANCOCIN) 1,250 mg in sodium chloride 0.9 % 250 mL IVPB  Status:  Discontinued     1,250 mg 166.7 mL/hr over 90 Minutes Intravenous Every 24 hours 11/29/17 0715 11/30/17 1515   11/29/17 2130  vancomycin (VANCOCIN) 1,250 mg in sodium chloride 0.9 % 250 mL IVPB  Status:  Discontinued     1,250 mg 166.7 mL/hr over 90 Minutes Intravenous Every 24 hours 11/28/17 2104 11/29/17 0715   11/29/17 0800  vancomycin (VANCOCIN) 1,500 mg in sodium chloride 0.9 % 500 mL IVPB     1,500 mg 250 mL/hr over 120 Minutes Intravenous  Once 11/29/17 0715 11/29/17 1005   11/28/17 2100  vancomycin (VANCOCIN) 1,500 mg in sodium chloride 0.9 % 500 mL IVPB  Status:  Discontinued     1,500 mg 250 mL/hr over 120 Minutes Intravenous  Once 11/28/17 2055 11/29/17 3235  Assessment/Plan Perianal abscess and cellulitis S/P I&D 12/02/17 Dr. Redmond Pulling  - POD#2 - induration/cellulitis improving - continue IV abx per ID - daily dressing changes with loose packing to wick - patient may shower    LOS: 6 days    Brigid Re , [facility] 12/04/2017, 12:08 PM Pager: [phone] Consults: [phone] Mon-Fri 7:00 am-4:30 pm Sat-Sun 7:00 am-11:30 am

## 2017-12-04 NOTE — Discharge Instructions (Signed)
Cellulitis, Adult Cellulitis is a skin infection. The infected area is usually red and tender. This condition occurs most often in the arms and lower legs. The infection can travel to the muscles, blood, and underlying tissue and become serious. It is very important to get treated for this condition. What are the causes? Cellulitis is caused by bacteria. The bacteria enter through a break in the skin, such as a cut, burn, insect bite, open sore, or crack. What increases the risk? This condition is more likely to occur in people who:  Have a weak defense system (immune system).  Have open wounds on the skin such as cuts, burns, bites, and scrapes. Bacteria can enter the body through these open wounds.  Are older.  Have diabetes.  Have a type of long-lasting (chronic) liver disease (cirrhosis) or kidney disease.  Use IV drugs.  What are the signs or symptoms? Symptoms of this condition include:  Redness, streaking, or spotting on the skin.  Swollen area of the skin.  Tenderness or pain when an area of the skin is touched.  Warm skin.  Fever.  Chills.  Blisters.  How is this diagnosed? This condition is diagnosed based on a medical history and physical exam. You may also have tests, including:  Blood tests.  Lab tests.  Imaging tests.  How is this treated? Treatment for this condition may include:  Medicines, such as antibiotic medicines or antihistamines.  Supportive care, such as rest and application of cold or warm cloths (cold or warm compresses) to the skin.  Hospital care, if the condition is severe. The infection usually gets better within 1-2 days of treatment. Follow these instructions at home:  Take over-the-counter and prescription medicines only as told by your health care provider.  If you were prescribed an antibiotic medicine, take it as told by your health care provider. Do not stop taking the antibiotic even if you start to feel better.  Drink  enough fluid to keep your urine clear or pale yellow.  Do not touch or rub the infected area.  Raise (elevate) the infected area above the level of your heart while you are sitting or lying down.  Apply warm or cold compresses to the affected area as told by your health care provider. - pack wound gently daily or as needed if dressing becomes soiled  Keep all follow-up visits as told by your health care provider. This is important. These visits let your health care provider make sure a more serious infection is not developing. Contact a health care provider if:  You have a fever.  Your symptoms do not improve within 1-2 days of starting treatment.  Your bone or joint underneath the infected area becomes painful after the skin has healed.  Your infection returns in the same area or another area.  You notice a swollen bump in the infected area.  You develop new symptoms.  You have a general ill feeling (malaise) with muscle aches and pains. Get help right away if:  Your symptoms get worse.  You feel very sleepy.  You develop vomiting or diarrhea that persists.  You notice red streaks coming from the infected area.  Your red area gets larger or turns dark in color. This information is not intended to replace advice given to you by your health care provider. Make sure you discuss any questions you have with your health care provider. Document Released: 08/15/2005 Document Revised: 03/15/2016 Document Reviewed: 09/14/2015 Elsevier Interactive Patient Education  2018 Elsevier  Inc. ° °

## 2017-12-04 NOTE — Progress Notes (Signed)
    [facility]   Reason for visit: Follow up on   Interval History: improved, feels better, WBC wnl, no fever.    Physical Exam: Constitutional:  Vitals:   12/03/17 2206 12/04/17 0554  BP: (!) 120/48 (!) 134/56  Pulse: (!) 53 (!) 53  Resp: 16 16  Temp: 98.4 F (36.9 C) 97.9 F (36.6 C)  SpO2: 98% 95%   patient appears in NAD HENT: no thrush Respiratory: Normal respiratory effort; CTA B Cardiovascular: RRR MS: wound open, some surrounding erythema, no pus  Review of Systems: Constitutional: negative for fatigue and malaise Gastrointestinal: negative for diarrhea Integument/breast: negative for rash  Lab Results  Component Value Date   WBC 9.9 12/04/2017   HGB 10.4 (L) 12/04/2017   HCT 31.9 (L) 12/04/2017   MCV 97.9 12/04/2017   PLT 213 12/04/2017    Lab Results  Component Value Date   CREATININE 0.69 12/03/2017   BUN 13 12/03/2017   NA 137 12/03/2017   K 3.6 12/03/2017   CL 102 12/03/2017   CO2 24 12/03/2017    Lab Results  Component Value Date   ALT 85 (H) 10/01/2017   AST 46 (H) 10/01/2017   ALKPHOS 56 10/01/2017     Microbiology: Recent Results (from the past 240 hour(s))  Urine culture     Status: None   Collection Time: 11/28/17  7:27 PM  Result Value Ref Range Status   Specimen Description URINE, RANDOM  Final   Special Requests NONE  Final   Culture NO GROWTH  Final   Report Status 11/30/2017 FINAL  Final  Surgical pcr screen     Status: None   Collection Time: 12/01/17  9:38 PM  Result Value Ref Range Status   MRSA, PCR NEGATIVE NEGATIVE Final   Staphylococcus aureus NEGATIVE NEGATIVE Final    Comment: (NOTE) The Xpert SA Assay (FDA approved for NASAL specimens in patients 67 years of age and older), is one component of a comprehensive surveillance program. It is not intended to diagnose infection nor to guide or monitor treatment.   Aerobic/Anaerobic Culture (surgical/deep wound)     Status: None (Preliminary  result)   Collection Time: 12/02/17  8:03 AM  Result Value Ref Range Status   Specimen Description ABSCESS  Final   Special Requests PERIRECTAL  Final   Gram Stain   Final    RARE WBC PRESENT, PREDOMINANTLY PMN RARE GRAM POSITIVE COCCI IN CLUSTERS    Culture RARE STAPHYLOCOCCUS AUREUS  Final   Report Status PENDING  Incomplete    Impression/Plan:  1. Cellulitis and abscess - improved though some remaining cellulitis.  No sensitivities to date.  Will check again tomorrow and determine oral therapy for her to go out on.    2.  Screening - HIV negative.

## 2017-12-04 NOTE — Progress Notes (Deleted)
    [facility]   Reason for visit: Follow up on leg ulcer  Interval History: improved, has VAC on.  After visit was seen by Dr. Redmond Pulling and improved though some cellulitis noted.  Normal WBC.  No fever.  No associated rash.    Physical Exam: Constitutional:  Vitals:   12/03/17 2206 12/04/17 0554  BP: (!) 120/48 (!) 134/56  Pulse: (!) 53 (!) 53  Resp: 16 16  Temp: 98.4 F (36.9 C) 97.9 F (36.6 C)  SpO2: 98% 95%   patient appears in NAD Eyes: anicteric HENT: no thrush Respiratory: Normal respiratory effort; CTA B Cardiovascular: RRR MS: VAC in place, leg wrapped  Review of Systems: Constitutional: negative for fevers and chills Gastrointestinal: negative for diarrhea  Lab Results  Component Value Date   WBC 9.9 12/04/2017   HGB 10.4 (L) 12/04/2017   HCT 31.9 (L) 12/04/2017   MCV 97.9 12/04/2017   PLT 213 12/04/2017    Lab Results  Component Value Date   CREATININE 0.69 12/03/2017   BUN 13 12/03/2017   NA 137 12/03/2017   K 3.6 12/03/2017   CL 102 12/03/2017   CO2 24 12/03/2017    Lab Results  Component Value Date   ALT 85 (H) 10/01/2017   AST 46 (H) 10/01/2017   ALKPHOS 56 10/01/2017     Microbiology: Recent Results (from the past 240 hour(s))  Urine culture     Status: None   Collection Time: 11/28/17  7:27 PM  Result Value Ref Range Status   Specimen Description URINE, RANDOM  Final   Special Requests NONE  Final   Culture NO GROWTH  Final   Report Status 11/30/2017 FINAL  Final  Surgical pcr screen     Status: None   Collection Time: 12/01/17  9:38 PM  Result Value Ref Range Status   MRSA, PCR NEGATIVE NEGATIVE Final   Staphylococcus aureus NEGATIVE NEGATIVE Final    Comment: (NOTE) The Xpert SA Assay (FDA approved for NASAL specimens in patients 43 years of age and older), is one component of a comprehensive surveillance program. It is not intended to diagnose infection nor to guide or monitor treatment.     Aerobic/Anaerobic Culture (surgical/deep wound)     Status: None (Preliminary result)   Collection Time: 12/02/17  8:03 AM  Result Value Ref Range Status   Specimen Description ABSCESS  Final   Special Requests PERIRECTAL  Final   Gram Stain   Final    RARE WBC PRESENT, PREDOMINANTLY PMN RARE GRAM POSITIVE COCCI IN CLUSTERS    Culture RARE STAPHYLOCOCCUS AUREUS  Final   Report Status PENDING  Incomplete    Impression/Plan:  1. Cellulitis - exam by Dr. Loura Back noted some cellulitis still.  Oral therapy is sufficient.  Ideally would use linezolid 600 mg oral for 3 more days.  Would check with CM to be sure it is covered by her insurance.  Other option, though I prefer linezolid, is doxycycline plus amoxicillin, same duration.    2.  Ulcer - improving and continuing on VAC.    3.  Screening - HIV negative  I will sign off, call with any questions. thanks

## 2017-12-04 NOTE — Progress Notes (Addendum)
Pharmacy Antibiotic Note  Courtney Crane is a 67 y.o. female admitted on 11/28/2017 with inner thigh cellulitis and abscess, s/p &D on 12/02/17.  Pharmacy has been consulted for vancomycin dosing.  AKI resolved and renal function is now stable.  Patient's vancomycin trough is acceptable at 13 mcg/mL; however, lab was drawn 2 hours early so true trough is lower.  Patient is afebrile and her WBC is has normalized.   Plan: Increase vanc to 1gm IV Q12H Monitor renal fxn, micro data, vanc trough as needed   Height: 5\' 2"  (157.5 cm) Weight: 154 lb 12.2 oz (70.2 kg) IBW/kg (Calculated) : 50.1  Temp (24hrs), Avg:98.2 F (36.8 C), Min:97.9 F (36.6 C), Max:98.4 F (36.9 C)  Recent Labs  Lab 11/28/17 1819 11/29/17 0026 12/01/17 0933 12/02/17 1000 12/03/17 0819 12/04/17 0423 12/04/17 0843  WBC  --  13.9* 12.1* 8.5 17.2* 9.9  --   CREATININE 1.50* 1.31* 0.81 0.77 0.69  --   --   VANCOTROUGH  --   --   --   --   --   --  13*    Estimated Creatinine Clearance: 63.4 mL/min (by C-G formula based on SCr of 0.69 mg/dL).    Allergies  Allergen Reactions  . Demerol [Meperidine] Nausea Only  . Tape Rash    Vanc 1/11 >>  11/16 VT = 13 mcg/mL (drawn 2 hrs early) on 750mg  q12 >> 1g q12  1/10 UCx - negative 1/14 perirectal abscess cx - Staph aureus (preliminary)   Hareem Surowiec D. Mina Marble, PharmD, BCPS Pager:  [phone] 12/04/2017, 1:49 PM

## 2017-12-05 ENCOUNTER — Inpatient Hospital Stay (HOSPITAL_COMMUNITY): Payer: Medicare HMO

## 2017-12-05 DIAGNOSIS — Z1629 Resistance to other single specified antibiotic: Secondary | ICD-10-CM

## 2017-12-05 DIAGNOSIS — B9562 Methicillin resistant Staphylococcus aureus infection as the cause of diseases classified elsewhere: Secondary | ICD-10-CM

## 2017-12-05 DIAGNOSIS — R945 Abnormal results of liver function studies: Secondary | ICD-10-CM

## 2017-12-05 LAB — COMPREHENSIVE METABOLIC PANEL
ALBUMIN: 2.9 g/dL — AB (ref 3.5–5.0)
ALT: 202 U/L — ABNORMAL HIGH (ref 14–54)
AST: 162 U/L — AB (ref 15–41)
Alkaline Phosphatase: 112 U/L (ref 38–126)
Anion gap: 10 (ref 5–15)
BUN: 14 mg/dL (ref 6–20)
CHLORIDE: 105 mmol/L (ref 101–111)
CO2: 24 mmol/L (ref 22–32)
CREATININE: 0.82 mg/dL (ref 0.44–1.00)
Calcium: 9 mg/dL (ref 8.9–10.3)
GFR calc Af Amer: >60 mL/min (ref >60)
GFR calc non Af Amer: >60 mL/min (ref >60)
GLUCOSE: 97 mg/dL (ref 65–99)
Potassium: 3.9 mmol/L (ref 3.5–5.1)
SODIUM: 139 mmol/L (ref 135–145)
Total Bilirubin: 0.5 mg/dL (ref 0.3–1.2)
Total Protein: 6.3 g/dL — ABNORMAL LOW (ref 6.5–8.1)

## 2017-12-05 LAB — CBC WITH DIFFERENTIAL/PLATELET
Basophils Absolute: 0.1 10*3/uL (ref 0.0–0.1)
Basophils Relative: 1 %
EOS ABS: 0.3 10*3/uL (ref 0.0–0.7)
Eosinophils Relative: 4 %
HCT: 32.4 % — ABNORMAL LOW (ref 36.0–46.0)
HEMOGLOBIN: 10.8 g/dL — AB (ref 12.0–15.0)
LYMPHS PCT: 44 %
Lymphs Abs: 3 10*3/uL (ref 0.7–4.0)
MCH: 32.8 pg (ref 26.0–34.0)
MCHC: 33.3 g/dL (ref 30.0–36.0)
MCV: 98.5 fL (ref 78.0–100.0)
MONO ABS: 0.6 10*3/uL (ref 0.1–1.0)
Monocytes Relative: 8 %
Neutro Abs: 3 10*3/uL (ref 1.7–7.7)
Neutrophils Relative %: 43 %
PLATELETS: 199 10*3/uL (ref 150–400)
RBC: 3.29 MIL/uL — AB (ref 3.87–5.11)
RDW: 14.9 % (ref 11.5–15.5)
WBC: 7 10*3/uL (ref 4.0–10.5)

## 2017-12-05 LAB — MAGNESIUM: Magnesium: 2 mg/dL (ref 1.7–2.4)

## 2017-12-05 LAB — PHOSPHORUS: PHOSPHORUS: 4.2 mg/dL (ref 2.5–4.6)

## 2017-12-05 MED ORDER — DOXYCYCLINE HYCLATE 100 MG PO TABS
100.0000 mg | ORAL_TABLET | Freq: Two times a day (BID) | ORAL | Status: DC
  Administered 2017-12-05: 100 mg via ORAL
  Filled 2017-12-05: qty 1

## 2017-12-05 MED ORDER — DOCUSATE SODIUM 100 MG PO CAPS: 100.0000 mg | ORAL_CAPSULE | Freq: Two times a day (BID) | ORAL | 0 refills | Status: DC

## 2017-12-05 MED ORDER — DOXYCYCLINE HYCLATE 100 MG PO TABS: 100.0000 mg | ORAL_TABLET | Freq: Two times a day (BID) | ORAL | 0 refills | Status: AC

## 2017-12-05 MED ORDER — GABAPENTIN 600 MG PO TABS: 300.0000 mg | ORAL_TABLET | Freq: Two times a day (BID) | ORAL | 0 refills | Status: DC

## 2017-12-05 NOTE — Progress Notes (Signed)
    [facility]   Reason for visit: Follow up on   Interval History: improved, MRSA with sensitivities noted, WBC wnl, no fever.    Physical Exam: Constitutional:  Vitals:   12/05/17 0528 12/05/17 1322  BP: (!) 150/53 140/66  Pulse: (!) 58 (!) 58  Resp: 18 18  Temp: 98.3 F (36.8 C) 98.3 F (36.8 C)  SpO2: 98% 97%   patient appears in NAD   Review of Systems:  Gastrointestinal: negative for diarrhea   Lab Results  Component Value Date   WBC 7.0 12/05/2017   HGB 10.8 (L) 12/05/2017   HCT 32.4 (L) 12/05/2017   MCV 98.5 12/05/2017   PLT 199 12/05/2017    Lab Results  Component Value Date   CREATININE 0.82 12/05/2017   BUN 14 12/05/2017   NA 139 12/05/2017   K 3.9 12/05/2017   CL 105 12/05/2017   CO2 24 12/05/2017    Lab Results  Component Value Date   ALT 202 (H) 12/05/2017   AST 162 (H) 12/05/2017   ALKPHOS 112 12/05/2017     Microbiology: Recent Results (from the past 240 hour(s))  Urine culture     Status: None   Collection Time: 11/28/17  7:27 PM  Result Value Ref Range Status   Specimen Description URINE, RANDOM  Final   Special Requests NONE  Final   Culture NO GROWTH  Final   Report Status 11/30/2017 FINAL  Final  Surgical pcr screen     Status: None   Collection Time: 12/01/17  9:38 PM  Result Value Ref Range Status   MRSA, PCR NEGATIVE NEGATIVE Final   Staphylococcus aureus NEGATIVE NEGATIVE Final    Comment: (NOTE) The Xpert SA Assay (FDA approved for NASAL specimens in patients 2 years of age and older), is one component of a comprehensive surveillance program. It is not intended to diagnose infection nor to guide or monitor treatment.   Aerobic/Anaerobic Culture (surgical/deep wound)     Status: None (Preliminary result)   Collection Time: 12/02/17  8:03 AM  Result Value Ref Range Status   Specimen Description ABSCESS  Final   Special Requests PERIRECTAL  Final   Gram Stain   Final    RARE WBC PRESENT,  PREDOMINANTLY PMN RARE GRAM POSITIVE COCCI IN CLUSTERS    Culture   Final    RARE METHICILLIN RESISTANT STAPHYLOCOCCUS AUREUS NO ANAEROBES ISOLATED; CULTURE IN PROGRESS FOR 5 DAYS    Report Status PENDING  Incomplete   Organism ID, Bacteria METHICILLIN RESISTANT STAPHYLOCOCCUS AUREUS  Final      Susceptibility   Methicillin resistant staphylococcus aureus - MIC*    CIPROFLOXACIN >=8 RESISTANT Resistant     ERYTHROMYCIN >=8 RESISTANT Resistant     GENTAMICIN <=0.5 SENSITIVE Sensitive     OXACILLIN >=4 RESISTANT Resistant     TETRACYCLINE <=1 SENSITIVE Sensitive     VANCOMYCIN 1 SENSITIVE Sensitive     TRIMETH/SULFA <=10 SENSITIVE Sensitive     CLINDAMYCIN <=0.25 SENSITIVE Sensitive     RIFAMPIN <=0.5 SENSITIVE Sensitive     Inducible Clindamycin NEGATIVE Sensitive     * RARE METHICILLIN RESISTANT STAPHYLOCOCCUS AUREUS    Impression/Plan:  1. Cellulitis and abscess - some cellulitis, improved overall.  Doxycyline sensitive so can use doxycyline 100 mg bid for 7 days. Has follow up with surgery.  We are available if needed.   I will sign off, call with questions.

## 2017-12-05 NOTE — Discharge Summary (Signed)
Physician Discharge Summary  Courtney Crane IRS:[id number] DOB: Jun 16, 1951 DOA: 11/28/2017  PCP: Velna Hatchet, MD  Admit date: 11/28/2017 Discharge date: 12/05/2017  Admitted From: Home Disposition:  Home  Recommendations for Outpatient Follow-up:  1. Follow up with PCP in 1-2 weeks 2. Follow up with General Surgery within 1 week 3. Follow up with Infectious Diseases as needed 4. Please obtain CMP/CBC, Mag, Phos in one week 5. Follow up with PCP for further workup of Elevated LFT's and Liver Granuloma 6. Please follow up on the following pending results: Acute Hepatitis Panel   Home Health: No Equipment/Devices: None    Discharge Condition: Stable CODE STATUS: FULL CODE Diet recommendation: Heart Healthy Carb Modified Diet  Brief/Interim Summary: Courtney Crane a 67 y.o.female with medical history significant ofIBS; HTN; HLD: and RA previously on hydroxychloroquine and other comorbids. Patient presented after failing outpatient treatment for cellulitis of her perineal area. Started on vancomycin with some mild improvement. Possible abscess formations/p I&D on 11/14 Cultures grew MRSA. Initially patient was on IV Vancomycin and transitioned to po Doxycycline x 7 after ID recommendations. ID and General Surgery followed and signed off. Hospitalization complicated by elevated LFT's. Patient improved from her Cellulitis and was deemed medically stable to D/C Home and will need to complete Abnormal LFT workup as an outpatient. She will follow up with PCP, General Surgery, and ID as an outpatient.   Discharge Diagnoses:  Principal Problem:   Cellulitis and abscess of buttock Active Problems:   Prediabetes   Anemia   GERD (gastroesophageal reflux disease)   HTN (hypertension)   AKI (acute kidney injury) (HCC)   Rheumatoid arthritis (HCC)   Leukocytosis  Cellulitis with Left Perianal Abscess s/p I and D POD 3 -Leftperineal and buttock area. Significantly tender but  improved. No purulent drainage.  -CT negative for abscess. Margins decreased with some increased fluctuance. -[place] significant for fluid collection.  -General Surgery Consulted -Underwent I&D 11/14 without gross purulence. Cultures obtained ands showed MRSA with sensitivities  -Vancomycin changed to po Doxycycline by ID -C/w Bowel Regimen with Miralax 17 grams po Dailyprn and Docusate 100 mg po BID -Cellulitis is improving. -General Surgery recommending home wound care and daily dressing changes and as needed for soilage; Per Surgery continue to wick the wound with gauze to prevent skin from closing until deeper layer has granulated.  -Follow up with General Surgery as an outpatient -Follow up with Infectious Diseases Dr. Linus Salmons as Needed   Essential Hypertension -Hydrochlorothiazide and Amlodipine held on admission -Continue Amlodipine 10 mg po Daily and restarted Home HCTZ and ARB  Acute Kidney Injury -Resolved. BUN/Cr now 14/0.82 -Follow up with PCP as an outpatient to repeat CMP  Hyperlipidemia -Discontinued  Rosuvastatin 5 mg po 3 times weekly (M/W/F) given Elevated LFTs   Rheumatoid Arthritis -Patient is on Plaquenil as an outpatient but has discontinued herself. -Follow up with Rheumatology as an outpatient -Pain Control with Acetaminophen 650 mg po q6h, Oxycodone IR 5-10 mg po q4hprn, and IV Morphine 1-3 mg IV q1hprn Severe Pain while hospitalized   Normocytic Anemia -Patient's Hb/Hct went from 10.6/32.3 -> 10.4/31.9 -> 10.8/32.4 -Continue to Monitor for S/Sx of Bleeding -Repeat CBC as an outpatient   Leukocytosis -Improved. WBC went from 17.2 -> 9.9 -> 7.0 -Continue to Monitor for S/Sx of of Infection as patient had I and D done -Repeat CBC as an outpatient   IFG/Prediabetes -Blood Sugars been ranging from 95-121 -If consistently elevated will add Novolog SSI Insulin  Abnormal LFT's -? Unclear  Etiology but possibly reactive; D/C'd Statin -AST went from 54 ->  162 -ALT went from 107 -> 202 -U/S RUQ showed There is a 4 mm presumed calcified granuloma in the anterior segment right lobe of the liver. No other focal liver lesions are evident. Liver echogenicity is diffusely increased, likely indictative of hepatic steatosis . Portal vein is patent on color Doppler imaging with normal direction of blood flow towards the liver. -Acute Hepatitis Panel Pending -Follow up with PCP to repeat CMP as an outpatient and for further evaluation of Liver Granuloma   Discharge Instructions  Discharge Instructions    Call MD for:  difficulty breathing, headache or visual disturbances   Complete by:  As directed    Call MD for:  extreme fatigue   Complete by:  As directed    Call MD for:  hives   Complete by:  As directed    Call MD for:  persistant dizziness or light-headedness   Complete by:  As directed    Call MD for:  persistant nausea and vomiting   Complete by:  As directed    Call MD for:  redness, tenderness, or signs of infection (pain, swelling, redness, odor or green/yellow discharge around incision site)   Complete by:  As directed    Call MD for:  severe uncontrolled pain   Complete by:  As directed    Call MD for:  temperature >100.4   Complete by:  As directed    Diet - low sodium heart healthy   Complete by:  As directed    Discharge instructions   Complete by:  As directed    Follow up with PCP and General Surgery as an outpatient. Take all medications as prescribed. If symptoms change or worsen please return to the ED for evaluation.   Increase activity slowly   Complete by:  As directed      Allergies as of 12/05/2017      Reactions   Demerol [meperidine] Nausea Only   Tape Rash      Medication List    STOP taking these medications   acetaminophen 325 MG tablet Commonly known as:  TYLENOL   diclofenac 75 MG EC tablet Commonly known as:  VOLTAREN   ibuprofen 200 MG tablet Commonly known as:  ADVIL,MOTRIN   rosuvastatin 5  MG tablet Commonly known as:  CRESTOR     TAKE these medications   amLODipine 10 MG tablet Commonly known as:  NORVASC Take 10 mg by mouth daily.   aspirin 81 MG tablet Take 81 mg by mouth daily.   docusate sodium 100 MG capsule Commonly known as:  COLACE Take 1 capsule (100 mg total) by mouth 2 (two) times daily.   doxycycline 100 MG tablet Commonly known as:  VIBRA-TABS Take 1 tablet (100 mg total) by mouth every 12 (twelve) hours for 7 days.   Fish Oil 1000 MG Caps Take 1 capsule by mouth 2 (two) times daily.   gabapentin 600 MG tablet Commonly known as:  NEURONTIN Take 0.5 tablets (300 mg total) by mouth 2 (two) times daily.   hydrochlorothiazide 25 MG tablet Commonly known as:  HYDRODIURIL Take 25 mg by mouth daily.   losartan 100 MG tablet Commonly known as:  COZAAR Take 100 mg by mouth daily.   MELATONIN PO Take 1 tablet by mouth at bedtime as needed.   multivitamin tablet Take 1 tablet by mouth daily.   omeprazole 20 MG capsule Commonly known as:  PRILOSEC  Take 20 mg daily by mouth.   PSYLLIUM PO Take 1 packet by mouth daily.   Vitamin D3 10000 units Tabs Take 1 tablet by mouth once a week.      Follow-up Information    [facility], [place]. Go on 12/12/2017.   Specialty:  General Surgery Why:  Your appointment is at 3:00 PM. Please arrive 30 min prior to appointment time. Bring photo ID and insurance information.  Contact information: [address] [place] [address] [phone]        Velna Hatchet, MD. Call.   Specialty:  Internal Medicine Why:  Follow up within 1 week to have repeat bloodwork and follow up on Liver Lesion Contact information: [place] [address] [phone]        Thayer Headings, MD. Call.   Specialty:  Infectious Diseases Why:  Follow up as needed  Contact information: [address] [place] [address] [phone]          Allergies  Allergen  Reactions  . Demerol [Meperidine] Nausea Only  . Tape Rash   Consultations:  General Surgery   Infectious Diseases   Procedures/Studies: Ct Pelvis W Contrast  Result Date: 11/28/2017 CLINICAL DATA:  Left buttock infection with concern for abscess. EXAM: CT PELVIS WITH CONTRAST TECHNIQUE: Multidetector CT imaging of the pelvis was performed using the standard protocol following the bolus administration of intravenous contrast. CONTRAST:  80 mL ISOVUE-300 IV COMPARISON:  None. FINDINGS: Urinary Tract: The bladder is nearly decompressed and unremarkable in appearance by CT without evidence of wall thickening or surrounding inflammation. Bowel: Pelvic bowel loops show no evidence of inflammation or dilatation. Mild diverticulosis present of the sigmoid colon without evidence of acute diverticulitis. Vascular/Lymphatic: Iliac and femoral vessels are normal in appearance. No evidence of lymphadenopathy in the pelvis or inguinal regions. Reproductive: Status post hysterectomy. No adnexal masses identified. Other: There is extensive cellulitis of the left buttock region extending into the medial and posterior proximal thigh. Inflammation also extends across the perineum into the left labial region. There is no evidence by CT of focal liquefied abscess or underlying soft tissue foreign body. No soft tissue gas identified. Musculoskeletal: Bony structures are unremarkable. No evidence of osteomyelitis. IMPRESSION: Extensive left buttock cellulitis also involving the left perineum and upper posterior and medial proximal thigh. No evidence of focal abscess by CT or underlying soft tissue foreign body or gas. No evidence of osteomyelitis. Electronically Signed   By: Aletta Edouard M.D.   On: 11/28/2017 19:26   Dg Colon W/cm - Wo/w Kub  Result Date: 11/15/2017 CLINICAL DATA:  Recurrent diverticulitis in the left colon. Patient presents for pre-surgical planning. EXAM: BE WITH CONTRAST - WITHOUT AND WITH KUB  CONTRAST:  Single contrast barium per rectal tube. FLUOROSCOPY TIME:  Fluoroscopy Time:  4 minutes 24 seconds Radiation Exposure Index (if provided by the fluoroscopic device): 500 mGy Number of Acquired Spot Images: 15 COMPARISON:  None. FINDINGS: Scout radiograph demonstrates cholecystectomy clips in the right upper quadrant. No dilated small bowel loops. No significant colonic stool. No evidence of pneumatosis or pneumoperitoneum. No radiopaque urolithiasis. Moderate lumbar spondylosis. There was retrograde transit of barium contrast through the rectum and large bowel without delay, with contrast reflux into the terminal ileum. Normal large bowel anatomy with right lower quadrant cecum. No evidence of colorectal stricture or large filling defects. No evidence of perforation. There is scattered mild left colonic diverticulosis throughout the splenic flexure of the  colon, descending colon and sigmoid colon. No significant diverticulosis in the right colon. No fold thickening in the large bowel. Normal rectum. IMPRESSION: Mild scattered left colonic diverticulosis. No evidence of colorectal stricture, perforation or large filling defect. Electronically Signed   By: Ilona Sorrel M.D.   On: 11/15/2017 11:34   [place] Soft Tissue Non Vascular  Result Date: 12/01/2017 CLINICAL DATA:  Cellulitis and possible abscess. EXAM: ULTRASOUND left LOWER EXTREMITY LIMITED TECHNIQUE: Ultrasound examination of the lower extremity soft tissues was performed in the area of clinical concern. COMPARISON:  CT pelvis 11/28/2017 IN THE AREA OF CONCERN ALONG THE LEFT BUTTOCK REGION THERE IS A 2.6 BY 0.6 BY 1.2 CM AREA OF HYPOECHOGENICITY FAVORING PHLEGMON OR EARLY ABSCESS.  OVERLYING INFILTRATIVE EDEMA IN THE SUBCUTANEOUS TISSUES OF THE LEFT PERINEUM.: IN THE AREA OF CONCERN ALONG THE LEFT BUTTOCK REGION THERE IS A 2.6 BY 0.6 BY 1.2 CM AREA OF HYPOECHOGENICITY FAVORING PHLEGMON OR EARLY ABSCESS. OVERLYING INFILTRATIVE  EDEMA IN THE SUBCUTANEOUS TISSUES OF THE LEFT PERINEUM. IMPRESSION: 1. Abnormal subcutaneous edema along the left perineum, with a somewhat confluent 2.6 by 0.6 by 1.2 cm (volume = 1 cm^3) area of hypoechogenicity favoring phlegmon or early abscess formation. Electronically Signed   By: Van Clines M.D.   On: 12/01/2017 11:03   US Abdomen Limited Ruq  Result Date: 12/05/2017 CLINICAL DATA:  Elevated liver enzymes EXAM: ULTRASOUND ABDOMEN LIMITED RIGHT UPPER QUADRANT COMPARISON:  CT lower abdomen and pelvis November 28, 2017 FINDINGS: Gallbladder: Surgically absent. Common bile duct: Diameter: 7 mm. No intrahepatic or extrahepatic biliary duct dilatation. Liver: There is a 4 mm presumed calcified granuloma in the anterior segment right lobe of the liver. No other focal liver lesions are evident. Liver echogenicity is diffusely increased. Portal vein is patent on color Doppler imaging with normal direction of blood flow towards the liver. IMPRESSION: Diffuse increase in liver echogenicity, a finding most likely indicative of hepatic steatosis. No focal liver lesions identified beyond a small presumed calcified granuloma. It must be cautioned that the sensitivity of ultrasound for detection of focal liver lesions is diminished significantly in this circumstance. Gallbladder absent. Electronically Signed   By: Lowella Grip III M.D.   On: 12/05/2017 14:14     Subjective: Seen and examined and stated she was feeling better. No CP or SOB. States left buttock check is not as tender. No other concerns or complaints and ready to go home.   Discharge Exam: Vitals:   12/05/17 0528 12/05/17 1322  BP: (!) 150/53 140/66  Pulse: (!) 58 (!) 58  Resp: 18 18  Temp: 98.3 F (36.8 C) 98.3 F (36.8 C)  SpO2: 98% 97%   Vitals:   12/04/17 1426 12/04/17 2152 12/05/17 0528 12/05/17 1322  BP: (!) 131/54 130/62 (!) 150/53 140/66  Pulse: (!) 58 (!) 58 (!) 58 (!) 58  Resp:  16 18 18   Temp: 98.2 F (36.8 C)  98.4 F (36.9 C) 98.3 F (36.8 C) 98.3 F (36.8 C)  TempSrc: Oral Oral Oral Oral  SpO2: 100% 97% 98% 97%  Weight:      Height:       General: Pt is alert, awake, not in acute distress Cardiovascular: RRR, S1/S2 +, no rubs, no gallops Respiratory: CTA bilaterally, no wheezing, no rhonchi Abdominal: Soft, NT, ND, bowel sounds + Extremities: no edema, no cyanosis Skin: Has erythema with packing in Incision on Left Buttock/Perianal area. Not as warm to touch   The results  of significant diagnostics from this hospitalization (including imaging, microbiology, ancillary and laboratory) are listed below for reference.    Microbiology: Recent Results (from the past 240 hour(s))  Urine culture     Status: None   Collection Time: 11/28/17  7:27 PM  Result Value Ref Range Status   Specimen Description URINE, RANDOM  Final   Special Requests NONE  Final   Culture NO GROWTH  Final   Report Status 11/30/2017 FINAL  Final  Surgical pcr screen     Status: None   Collection Time: 12/01/17  9:38 PM  Result Value Ref Range Status   MRSA, PCR NEGATIVE NEGATIVE Final   Staphylococcus aureus NEGATIVE NEGATIVE Final    Comment: (NOTE) The Xpert SA Assay (FDA approved for NASAL specimens in patients 68 years of age and older), is one component of a comprehensive surveillance program. It is not intended to diagnose infection nor to guide or monitor treatment.   Aerobic/Anaerobic Culture (surgical/deep wound)     Status: None (Preliminary result)   Collection Time: 12/02/17  8:03 AM  Result Value Ref Range Status   Specimen Description ABSCESS  Final   Special Requests PERIRECTAL  Final   Gram Stain   Final    RARE WBC PRESENT, PREDOMINANTLY PMN RARE GRAM POSITIVE COCCI IN CLUSTERS    Culture   Final    RARE METHICILLIN RESISTANT STAPHYLOCOCCUS AUREUS NO ANAEROBES ISOLATED; CULTURE IN PROGRESS FOR 5 DAYS    Report Status PENDING  Incomplete   Organism ID, Bacteria METHICILLIN RESISTANT  STAPHYLOCOCCUS AUREUS  Final      Susceptibility   Methicillin resistant staphylococcus aureus - MIC*    CIPROFLOXACIN >=8 RESISTANT Resistant     ERYTHROMYCIN >=8 RESISTANT Resistant     GENTAMICIN <=0.5 SENSITIVE Sensitive     OXACILLIN >=4 RESISTANT Resistant     TETRACYCLINE <=1 SENSITIVE Sensitive     VANCOMYCIN 1 SENSITIVE Sensitive     TRIMETH/SULFA <=10 SENSITIVE Sensitive     CLINDAMYCIN <=0.25 SENSITIVE Sensitive     RIFAMPIN <=0.5 SENSITIVE Sensitive     Inducible Clindamycin NEGATIVE Sensitive     * RARE METHICILLIN RESISTANT STAPHYLOCOCCUS AUREUS    Labs: BNP (last 3 results) No results for input(s): BNP in the last 8760 hours. Basic Metabolic Panel: Recent Labs  Lab 12/01/17 0933 12/02/17 1000 12/03/17 0819 12/04/17 1325 12/05/17 0628  NA 138 138 137 141 139  K 3.7 4.1 3.6 3.9 3.9  CL 104 103 102 108 105  CO2 24 24 24 27 24   GLUCOSE 99 121* 107* 96 97  BUN 12 11 13 12 14   CREATININE 0.81 0.77 0.69 0.77 0.82  CALCIUM 9.2 9.4 9.0 8.7* 9.0  MG  --   --   --  2.0 2.0  PHOS  --   --   --  4.2 4.2   Liver Function Tests: Recent Labs  Lab 12/04/17 1325 12/05/17 0628  AST 54* 162*  ALT 107* 202*  ALKPHOS 100 112  BILITOT 0.6 0.5  PROT 6.7 6.3*  ALBUMIN 2.9* 2.9*   No results for input(s): LIPASE, AMYLASE in the last 168 hours. No results for input(s): AMMONIA in the last 168 hours. CBC: Recent Labs  Lab 11/28/17 1800  12/01/17 0933 12/02/17 1000 12/03/17 0819 12/04/17 0423 12/05/17 0628  WBC 16.3*   < > 12.1* 8.5 17.2* 9.9 7.0  NEUTROABS 10.9*  --   --   --   --   --  3.0  HGB  12.2   < > 11.6* 12.4 10.6* 10.4* 10.8*  HCT 37.2   < > 34.6* 37.6 32.3* 31.9* 32.4*  MCV 96.4   < > 96.9 96.9 96.1 97.9 98.5  PLT 219   < > 196 221 215 213 199   < > = values in this interval not displayed.   Cardiac Enzymes: No results for input(s): CKTOTAL, CKMB, CKMBINDEX, TROPONINI in the last 168 hours. BNP: Invalid input(s): POCBNP CBG: No results for  input(s): GLUCAP in the last 168 hours. D-Dimer No results for input(s): DDIMER in the last 72 hours. Hgb A1c No results for input(s): HGBA1C in the last 72 hours. Lipid Profile No results for input(s): CHOL, HDL, LDLCALC, TRIG, CHOLHDL, LDLDIRECT in the last 72 hours. Thyroid function studies No results for input(s): TSH, T4TOTAL, T3FREE, THYROIDAB in the last 72 hours.  Invalid input(s): FREET3 Anemia work up No results for input(s): VITAMINB12, FOLATE, FERRITIN, TIBC, IRON, RETICCTPCT in the last 72 hours. Urinalysis    Component Value Date/Time   COLORURINE AMBER (A) 11/28/2017 1738   APPEARANCEUR HAZY (A) 11/28/2017 1738   LABSPEC 1.030 11/28/2017 1738   PHURINE 5.0 11/28/2017 1738   GLUCOSEU NEGATIVE 11/28/2017 1738   HGBUR NEGATIVE 11/28/2017 1738   BILIRUBINUR NEGATIVE 11/28/2017 1738   KETONESUR NEGATIVE 11/28/2017 1738   PROTEINUR 30 (A) 11/28/2017 1738   NITRITE NEGATIVE 11/28/2017 1738   LEUKOCYTESUR MODERATE (A) 11/28/2017 1738   Sepsis Labs Invalid input(s): PROCALCITONIN,  WBC,  LACTICIDVEN Microbiology Recent Results (from the past 240 hour(s))  Urine culture     Status: None   Collection Time: 11/28/17  7:27 PM  Result Value Ref Range Status   Specimen Description URINE, RANDOM  Final   Special Requests NONE  Final   Culture NO GROWTH  Final   Report Status 11/30/2017 FINAL  Final  Surgical pcr screen     Status: None   Collection Time: 12/01/17  9:38 PM  Result Value Ref Range Status   MRSA, PCR NEGATIVE NEGATIVE Final   Staphylococcus aureus NEGATIVE NEGATIVE Final    Comment: (NOTE) The Xpert SA Assay (FDA approved for NASAL specimens in patients 57 years of age and older), is one component of a comprehensive surveillance program. It is not intended to diagnose infection nor to guide or monitor treatment.   Aerobic/Anaerobic Culture (surgical/deep wound)     Status: None (Preliminary result)   Collection Time: 12/02/17  8:03 AM  Result Value Ref  Range Status   Specimen Description ABSCESS  Final   Special Requests PERIRECTAL  Final   Gram Stain   Final    RARE WBC PRESENT, PREDOMINANTLY PMN RARE GRAM POSITIVE COCCI IN CLUSTERS    Culture   Final    RARE METHICILLIN RESISTANT STAPHYLOCOCCUS AUREUS NO ANAEROBES ISOLATED; CULTURE IN PROGRESS FOR 5 DAYS    Report Status PENDING  Incomplete   Organism ID, Bacteria METHICILLIN RESISTANT STAPHYLOCOCCUS AUREUS  Final      Susceptibility   Methicillin resistant staphylococcus aureus - MIC*    CIPROFLOXACIN >=8 RESISTANT Resistant     ERYTHROMYCIN >=8 RESISTANT Resistant     GENTAMICIN <=0.5 SENSITIVE Sensitive     OXACILLIN >=4 RESISTANT Resistant     TETRACYCLINE <=1 SENSITIVE Sensitive     VANCOMYCIN 1 SENSITIVE Sensitive     TRIMETH/SULFA <=10 SENSITIVE Sensitive     CLINDAMYCIN <=0.25 SENSITIVE Sensitive     RIFAMPIN <=0.5 SENSITIVE Sensitive     Inducible Clindamycin NEGATIVE Sensitive     *  RARE METHICILLIN RESISTANT STAPHYLOCOCCUS AUREUS   Time coordinating discharge: 35 minutes  SIGNED:  Kerney Elbe, DO [facility] 12/05/2017, 4:23 PM Pager [phone]  If 7PM-7AM, please contact night-coverage [email or web address] Password [email or web address]

## 2017-12-05 NOTE — Progress Notes (Signed)
Patient discharge to home with instructions and some dressing supply.

## 2017-12-05 NOTE — Progress Notes (Signed)
[facility] Infusion Coordinator will follow pt with ID Team to support home IV ABX if needed at DC to home.  If patient discharges after hours, please call [phone].   Courtney Crane 12/05/2017, 9:28 AM

## 2017-12-05 NOTE — Progress Notes (Signed)
[facility] Progress Note  3 Days Post-Op  Subjective: CC: perianal abscess Pain unchanged. Discussed home wound care with patient and follow up instructions.  Afebrile, no tachycardia.   Objective: Vital signs in last 24 hours: Temp:  [98.2 F (36.8 C)-98.4 F (36.9 C)] 98.3 F (36.8 C) (01/17 0528) Pulse Rate:  [58] 58 (01/17 0528) Resp:  [16-18] 18 (01/17 0528) BP: (130-150)/(53-62) 150/53 (01/17 0528) SpO2:  [97 %-100 %] 98 % (01/17 0528) Last BM Date: 12/03/17  Intake/Output from previous day: 01/16 0701 - 01/17 0700 In: 1100 [P.O.:900; IV Piggyback:200] Out: 250 [Urine:250] Intake/Output this shift: Total I/O In: 240 [P.O.:240] Out: -   PE: Gen:  Alert, NAD, pleasant Card: pedal pulses 2+ BL Pulm:  Normal effort,  Abd: Soft, non-tender, non-distended GU: wound granulating, surrounding erythema and induration improving Skin: warm and dry, no rashes  Psych: A&Ox3      Lab Results:  Recent Labs    12/04/17 0423 12/05/17 0628  WBC 9.9 7.0  HGB 10.4* 10.8*  HCT 31.9* 32.4*  PLT 213 199   BMET Recent Labs    12/04/17 1325 12/05/17 0628  NA 141 139  K 3.9 3.9  CL 108 105  CO2 27 24  GLUCOSE 96 97  BUN 12 14  CREATININE 0.77 0.82  CALCIUM 8.7* 9.0   PT/INR No results for input(s): LABPROT, INR in the last 72 hours. CMP     Component Value Date/Time   NA 139 12/05/2017 0628   K 3.9 12/05/2017 0628   CL 105 12/05/2017 0628   CO2 24 12/05/2017 0628   GLUCOSE 97 12/05/2017 0628   BUN 14 12/05/2017 0628   CREATININE 0.82 12/05/2017 0628   CALCIUM 9.0 12/05/2017 0628   PROT 6.3 (L) 12/05/2017 0628   ALBUMIN 2.9 (L) 12/05/2017 0628   AST 162 (H) 12/05/2017 0628   ALT 202 (H) 12/05/2017 0628   ALKPHOS 112 12/05/2017 0628   BILITOT 0.5 12/05/2017 0628   GFRNONAA >60 12/05/2017 0628   GFRAA >60 12/05/2017 0628   Lipase  No results found for: LIPASE     Studies/Results: No results found.  Anti-infectives: Anti-infectives  (From admission, onward)   Start     Dose/Rate Route Frequency Ordered Stop   12/04/17 2100  vancomycin (VANCOCIN) IVPB 1000 mg/200 mL premix     1,000 mg 200 mL/hr over 60 Minutes Intravenous Every 12 hours 12/04/17 1351     12/02/17 1100  vancomycin (VANCOCIN) IVPB 750 mg/150 ml premix  Status:  Discontinued     750 mg 150 mL/hr over 60 Minutes Intravenous Every 12 hours 12/02/17 1027 12/04/17 1351   12/01/17 0800  vancomycin (VANCOCIN) IVPB 750 mg/150 ml premix  Status:  Discontinued     750 mg 150 mL/hr over 60 Minutes Intravenous Every 24 hours 11/30/17 1515 12/02/17 1027   11/30/17 0800  vancomycin (VANCOCIN) 1,250 mg in sodium chloride 0.9 % 250 mL IVPB  Status:  Discontinued     1,250 mg 166.7 mL/hr over 90 Minutes Intravenous Every 24 hours 11/29/17 0715 11/30/17 1515   11/29/17 2130  vancomycin (VANCOCIN) 1,250 mg in sodium chloride 0.9 % 250 mL IVPB  Status:  Discontinued     1,250 mg 166.7 mL/hr over 90 Minutes Intravenous Every 24 hours 11/28/17 2104 11/29/17 0715   11/29/17 0800  vancomycin (VANCOCIN) 1,500 mg in sodium chloride 0.9 % 500 mL IVPB     1,500 mg 250 mL/hr over 120 Minutes Intravenous  Once 11/29/17  0715 11/29/17 1005   11/28/17 2100  vancomycin (VANCOCIN) 1,500 mg in sodium chloride 0.9 % 500 mL IVPB  Status:  Discontinued     1,500 mg 250 mL/hr over 120 Minutes Intravenous  Once 11/28/17 2055 11/29/17 0718       Assessment/Plan Perianal abscess and cellulitis S/P I&D 12/02/17 Dr. Redmond Pulling  - POD#3 - induration/cellulitis improving - continue IV abx per ID, cx grew MRSA - daily dressing changes with loose packing to wick - patient may shower - follow up in the office in 1 week  Transaminitis per primary service - AST/ALT elevated from yesterday   [place] to d/c home from a surgical standpoint.    LOS: 7 days    Brigid Re , [facility] Surgery 12/05/2017, 10:38 AM Pager: [phone] Consults: [phone] Mon-Fri 7:00 am-4:30  pm Sat-Sun 7:00 am-11:30 am

## 2017-12-05 NOTE — Plan of Care (Signed)
  Progressing Health Behavior/Discharge Planning: Ability to manage health-related needs will improve 12/05/2017 0503 - Progressing by Fransisco Hertz, RN Clinical Measurements: Ability to maintain clinical measurements within normal limits will improve 12/05/2017 0503 - Progressing by Fransisco Hertz, RN Will remain free from infection 12/05/2017 0503 - Progressing by Fransisco Hertz, RN Diagnostic test results will improve 12/05/2017 0503 - Progressing by Fransisco Hertz, RN

## 2017-12-06 DIAGNOSIS — M150 Primary generalized (osteo)arthritis: Secondary | ICD-10-CM | POA: Diagnosis not present

## 2017-12-06 DIAGNOSIS — R7989 Other specified abnormal findings of blood chemistry: Secondary | ICD-10-CM | POA: Diagnosis not present

## 2017-12-06 DIAGNOSIS — Z79899 Other long term (current) drug therapy: Secondary | ICD-10-CM | POA: Diagnosis not present

## 2017-12-06 DIAGNOSIS — M2550 Pain in unspecified joint: Secondary | ICD-10-CM | POA: Diagnosis not present

## 2017-12-06 DIAGNOSIS — M064 Inflammatory polyarthropathy: Secondary | ICD-10-CM | POA: Diagnosis not present

## 2017-12-06 DIAGNOSIS — E663 Overweight: Secondary | ICD-10-CM | POA: Diagnosis not present

## 2017-12-06 DIAGNOSIS — Z6828 Body mass index (BMI) 28.0-28.9, adult: Secondary | ICD-10-CM | POA: Diagnosis not present

## 2017-12-06 LAB — HEPATITIS PANEL, ACUTE
HCV AB: 0.4 {s_co_ratio} (ref 0.0–0.9)
HEP A IGM: NEGATIVE
HEP B C IGM: NEGATIVE
HEP B S AG: NEGATIVE

## 2017-12-07 LAB — AEROBIC/ANAEROBIC CULTURE W GRAM STAIN (SURGICAL/DEEP WOUND)

## 2017-12-07 LAB — AEROBIC/ANAEROBIC CULTURE (SURGICAL/DEEP WOUND)

## 2017-12-11 DIAGNOSIS — R748 Abnormal levels of other serum enzymes: Secondary | ICD-10-CM | POA: Diagnosis not present

## 2017-12-11 DIAGNOSIS — I10 Essential (primary) hypertension: Secondary | ICD-10-CM | POA: Diagnosis not present

## 2017-12-11 DIAGNOSIS — R7989 Other specified abnormal findings of blood chemistry: Secondary | ICD-10-CM | POA: Diagnosis not present

## 2017-12-11 DIAGNOSIS — J45909 Unspecified asthma, uncomplicated: Secondary | ICD-10-CM | POA: Diagnosis not present

## 2017-12-11 DIAGNOSIS — M1990 Unspecified osteoarthritis, unspecified site: Secondary | ICD-10-CM | POA: Diagnosis not present

## 2017-12-11 DIAGNOSIS — D509 Iron deficiency anemia, unspecified: Secondary | ICD-10-CM | POA: Diagnosis not present

## 2017-12-11 DIAGNOSIS — E7849 Other hyperlipidemia: Secondary | ICD-10-CM | POA: Diagnosis not present

## 2017-12-11 DIAGNOSIS — L0889 Other specified local infections of the skin and subcutaneous tissue: Secondary | ICD-10-CM | POA: Diagnosis not present

## 2017-12-25 DIAGNOSIS — I10 Essential (primary) hypertension: Secondary | ICD-10-CM | POA: Diagnosis not present

## 2017-12-25 DIAGNOSIS — R748 Abnormal levels of other serum enzymes: Secondary | ICD-10-CM | POA: Diagnosis not present

## 2017-12-25 DIAGNOSIS — R7989 Other specified abnormal findings of blood chemistry: Secondary | ICD-10-CM | POA: Diagnosis not present

## 2017-12-25 DIAGNOSIS — Z6827 Body mass index (BMI) 27.0-27.9, adult: Secondary | ICD-10-CM | POA: Diagnosis not present

## 2017-12-25 DIAGNOSIS — L0889 Other specified local infections of the skin and subcutaneous tissue: Secondary | ICD-10-CM | POA: Diagnosis not present

## 2018-01-11 ENCOUNTER — Other Ambulatory Visit: Payer: Self-pay

## 2018-01-11 ENCOUNTER — Encounter (HOSPITAL_COMMUNITY): Payer: Self-pay | Admitting: *Deleted

## 2018-01-11 ENCOUNTER — Ambulatory Visit (HOSPITAL_COMMUNITY)
Admission: EM | Admit: 2018-01-11 | Discharge: 2018-01-11 | Disposition: A | Discharge status: Home / Self Care | Payer: Medicare HMO | Attending: Family Medicine | Admitting: Family Medicine

## 2018-01-11 DIAGNOSIS — L304 Erythema intertrigo: Secondary | ICD-10-CM | POA: Diagnosis not present

## 2018-01-11 HISTORY — DX: Methicillin resistant Staphylococcus aureus infection, unspecified site: A49.02

## 2018-01-11 MED ORDER — CLOTRIMAZOLE-BETAMETHASONE 1-0.05 % EX CREA: 1.0000 "application " | TOPICAL_CREAM | Freq: Two times a day (BID) | CUTANEOUS | 0 refills | Status: DC

## 2018-01-11 NOTE — ED Provider Notes (Signed)
  [facility]   Chief Complaint  Patient presents with  . Rash    Courtney Crane is a 67 y.o. female here for a skin complaint.  Duration: 2 weeks Location: perianal area Pruritic? Yes Painful? Yes- burning Drainage? No New soaps/lotions/topicals/detergents? No Sick contacts? No Other associated symptoms: none Therapies tried thus far: none Had abscess 1 mo ago, rxn to silver nitrate and adhesive 1.5 weeks ago, nothing since [place].   ROS:  Const: No fevers Skin: As noted in HPI  Past Medical History:  Diagnosis Date  . Anemia   . Anxiety   . Arthritis   . Asthma   . Colon polyps   . Complication of anesthesia    "sometimes I dont like to wake up after " ie difficult to awaken   . Depression   . Diverticulitis   . Diverticulosis   . GERD (gastroesophageal reflux disease)   . HLD (hyperlipidemia)   . HTN (hypertension)   . IBS (irritable bowel syndrome)   . Insomnia   . MRSA infection   . Osteoarthritis   . PONV (postoperative nausea and vomiting)   . Rheumatoid arthritis ([place])   . Seasonal allergies   . Shingles   . Status post dilation of esophageal narrowing   . Vitamin D deficiency    Allergies  Allergen Reactions  . Demerol [Meperidine] Nausea Only  . Nickel   . Tape Rash   Allergies as of 01/11/2018      Reactions   Demerol [meperidine] Nausea Only   Nickel    Tape Rash       BP (!) 149/78   Pulse 86   Temp 98 F (36.7 C) (Oral)   Resp 18   SpO2 100%  Gen: awake, alert, appearing stated age Lungs: No accessory muscle use Skin: Examined in presence of chaperone- pink skin along ext genitalia, perianal area, and in intergluteal cleft.  No drainage, erythema, TTP, fluctuance, excessive warmth, excoriation Psych: Age appropriate judgment and insight  Intertrigo - Lotrisone BID for 10 d. Avoid irritating substances.   No signs of infection. Will cover for yeast.  F/u w pcp prn. The patient voiced understanding and agreement to  the plan.  [place], DO 01/11/18 12:38 PM    Nani Ravens, [place]by Oyster, DO 01/11/18 1239

## 2018-01-11 NOTE — ED Triage Notes (Signed)
Reports perianal surgery for MRSA approx 1 month ago. Also had silver nitrate treatment post-op on 2/16. C/O rash and pruritis to anus since then with worsening.  States surgical wound seems OK, but concerned about proximity to rash.

## 2018-01-21 DIAGNOSIS — I10 Essential (primary) hypertension: Secondary | ICD-10-CM | POA: Diagnosis not present

## 2018-01-21 DIAGNOSIS — L0889 Other specified local infections of the skin and subcutaneous tissue: Secondary | ICD-10-CM | POA: Diagnosis not present

## 2018-01-21 DIAGNOSIS — Z6826 Body mass index (BMI) 26.0-26.9, adult: Secondary | ICD-10-CM | POA: Diagnosis not present

## 2018-01-21 DIAGNOSIS — R748 Abnormal levels of other serum enzymes: Secondary | ICD-10-CM | POA: Diagnosis not present

## 2018-01-21 DIAGNOSIS — R7989 Other specified abnormal findings of blood chemistry: Secondary | ICD-10-CM | POA: Diagnosis not present

## 2018-01-21 DIAGNOSIS — L0231 Cutaneous abscess of buttock: Secondary | ICD-10-CM | POA: Diagnosis not present

## 2018-01-27 ENCOUNTER — Encounter: Payer: Self-pay | Admitting: Internal Medicine

## 2018-01-27 ENCOUNTER — Ambulatory Visit: Payer: Medicare HMO | Admitting: Internal Medicine

## 2018-01-27 DIAGNOSIS — L0231 Cutaneous abscess of buttock: Secondary | ICD-10-CM | POA: Diagnosis not present

## 2018-01-27 DIAGNOSIS — Z6827 Body mass index (BMI) 27.0-27.9, adult: Secondary | ICD-10-CM | POA: Diagnosis not present

## 2018-01-27 DIAGNOSIS — Z1231 Encounter for screening mammogram for malignant neoplasm of breast: Secondary | ICD-10-CM | POA: Diagnosis not present

## 2018-01-27 DIAGNOSIS — L03317 Cellulitis of buttock: Secondary | ICD-10-CM

## 2018-01-27 DIAGNOSIS — Z01419 Encounter for gynecological examination (general) (routine) without abnormal findings: Secondary | ICD-10-CM | POA: Diagnosis not present

## 2018-01-27 NOTE — Progress Notes (Signed)
[facility]  Patient Active Problem List   Diagnosis Date Noted  . Cellulitis and abscess of buttock 11/28/2017    Priority: High  . Leukocytosis 12/04/2017  . AKI (acute kidney injury) ([place]) 11/29/2017  . Rheumatoid arthritis ([place]) 11/29/2017  . 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 History of shingles 06/06/2016  . Genital herpes simplex 06/06/2016  . Seasonal allergies 06/06/2016  . Seborrheic dermatitis   . Prediabetes   . Anemia   . GERD (gastroesophageal reflux disease)   . Vitamin D deficiency   . Insomnia, unspecified   . HTN (hypertension)     Patient's Medications  New Prescriptions   No medications on file  Previous Medications   AMLODIPINE (NORVASC) 10 MG TABLET    Take 10 mg by mouth daily.    ASPIRIN 81 MG TABLET    Take 81 mg by mouth daily.   CHOLECALCIFEROL (VITAMIN D3) 10000 UNITS TABS    Take 1 tablet by mouth once a week.   CLOTRIMAZOLE-BETAMETHASONE (LOTRISONE) CREAM    Apply 1 application topically 2 (two) times daily.   DOCUSATE SODIUM (COLACE) 100 MG CAPSULE    Take 1 capsule (100 mg total) by mouth 2 (two) times daily.   GABAPENTIN (NEURONTIN) 600 MG TABLET    Take 0.5 tablets (300 mg total) by mouth 2 (two) times daily.   HYDROCHLOROTHIAZIDE (HYDRODIURIL) 25 MG TABLET    Take 25 mg by mouth daily.   LOSARTAN (COZAAR) 100 MG TABLET    Take 100 mg by mouth daily.   MELATONIN PO    Take 1 tablet by mouth at bedtime as needed.    MULTIPLE VITAMIN (MULTIVITAMIN) TABLET    Take 1 tablet by mouth daily.   OMEGA-3 FATTY ACIDS (FISH OIL) 1000 MG CAPS    Take 1 capsule by mouth 2 (two) times daily.   OMEPRAZOLE (PRILOSEC) 20 MG CAPSULE    Take 20 mg daily by mouth.   PSYLLIUM PO    Take 1 packet by mouth daily.   SULFAMETHOXAZOLE-TRIMETHOPRIM (BACTRIM DS,SEPTRA DS) 800-160 MG TABLET    Take 1 tablet by mouth 2 (two) times daily with a meal.  Modified Medications   No medications on file  Discontinued  Medications   No medications on file    Subjective: Ms. Wilinski was hospitalized on 11/28/2017 with a left buttock abscess, perineal and labial cellulitis.  She was started on IV vancomycin.  An ultrasound revealed a fluid collection and she underwent incision and drainage by Dr. Greer Pickerel on 12/02/2017.  Operative cultures grew MRSA sensitive to vancomycin, tetracycline and trimethoprim sulfamethoxazole.  She received 7 days of IV vancomycin in the hospital and was discharged on 7 days of oral doxycycline, completing therapy on 12/12/2017.  She felt as though she was cured.  There was some concern that she may have developed an allergic reaction to silver nitrate.  She was also seen back in the emergency department on 01/11/2018 for some superficial redness and was treated for presumed candidiasis.  On 01/18/2018 she developed a new tender spot more posteriorly on her left buttock.  Dr. Ardeth Perfect, her primary care provider, called in oral cephalexin on 01/20/2018 but changed it to trimethoprim sulfamethoxazole the next day.  She was seen back in the general surgery office and underwent incision and drainage on 01/21/2018 and again on 01/22/2018.  She tells me that no cultures were obtained.  Her husband has been packing  the area daily.  That lesion seems to be getting better but her husband noted some new redness around the initial left buttock site 2 days ago.  She is concerned that she may be developing a recurrent abscess at the initial site.  When she was hospitalized in January she stopped taking hydroxychloroquine which she had been using for her rheumatoid arthritis.  Her pain has been slightly worse since that time but she manages it well with acetaminophen.  Review of Systems: Review of Systems  Constitutional: Negative for chills, diaphoresis, fever and weight loss.  Gastrointestinal: Negative for abdominal pain, diarrhea, nausea and vomiting.  Musculoskeletal: Positive for joint pain.    Past  Medical History:  Diagnosis Date  . Anemia   . Anxiety   . Arthritis   . Asthma   . Colon polyps   . Complication of anesthesia    "sometimes I dont like to wake up after " ie difficult to awaken   . Depression   . Diverticulitis   . Diverticulosis   . GERD (gastroesophageal reflux disease)   . HLD (hyperlipidemia)   . HTN (hypertension)   . IBS (irritable bowel syndrome)   . Insomnia   . MRSA infection   . Osteoarthritis   . PONV (postoperative nausea and vomiting)   . Rheumatoid arthritis ([place])   . Seasonal allergies   . Shingles   . Status post dilation of esophageal narrowing   . Vitamin D deficiency     Social History   Tobacco Use  . Smoking status: Never Smoker  . Smokeless tobacco: Never Used  Substance Use Topics  . Alcohol use: Yes    Alcohol/week: 4.2 oz    Types: 7 Standard drinks or equivalent per week  . Drug use: No    Family History  Problem Relation Age of Onset  . Colon polyps Mother   . Diabetes Mother   . Heart disease Mother   . Kidney cancer Mother   . Arthritis Mother   . Hypertension Mother   . Hyperlipidemia Mother   . Colon polyps Son   . CAD Brother     Allergies  Allergen Reactions  . Demerol [Meperidine] Nausea Only  . Nickel   . Tape Rash    Objective: Vitals:   01/27/18 1459  BP: 137/74  Pulse: 76  Temp: (!) 97.5 F (36.4 C)  TempSrc: Oral  Weight: 153 lb 8 oz (69.6 kg)  Height: 5\' 2"  (1.575 m)   Body mass index is 28.08 kg/m.  Physical Exam  Constitutional: She is oriented to person, place, and time.  She is worried but otherwise in no distress.  Neurological: She is alert and oriented to person, place, and time.  Skin:     Psychiatric: Mood and affect normal.    Lab Results    Problem List Items Addressed This Visit      High   Cellulitis and abscess of buttock    She developed a recurrent buttock abscess shortly after completing therapy for a MRSA abscess.  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  That area is improving after recent I&D.  She is concerned about the new, superficial redness over her initial surgical site.  This looks more like irritation and possibly excoriations from scratching rather than a recurrent abscess, but this will need to be watched closely.  I told her that it is fine for her to use moisturizing cream on the area.  She can also  use diphenhydramine at bedtime to help control the itching.  She is not sure if she might be scratching the area at night without being aware of it.  She will call me to let me know how she is doing.  She will complete 4 more days of trimethoprim sulfamethoxazole.          Michel Bickers, MD [facility] [phone] pager   [phone] cell 01/27/2018, 3:59 PM

## 2018-01-27 NOTE — Assessment & Plan Note (Signed)
She developed a recurrent buttock abscess shortly after completing therapy for a MRSA abscess.  This is likely a relapse of the same MRSA.  That area is improving after recent I&D.  She is concerned about the new, superficial redness over her initial surgical site.  This looks more like irritation and possibly excoriations from scratching rather than a recurrent abscess, but this will need to be watched closely.  I told her that it is fine for her to use moisturizing cream on the area.  She can also use diphenhydramine at bedtime to help control the itching.  She is not sure if she might be scratching the area at night without being aware of it.  She will call me to let me know how she is doing.  She will complete 4 more days of trimethoprim sulfamethoxazole.

## 2018-01-30 DIAGNOSIS — R82998 Other abnormal findings in urine: Secondary | ICD-10-CM | POA: Diagnosis not present

## 2018-01-30 DIAGNOSIS — E559 Vitamin D deficiency, unspecified: Secondary | ICD-10-CM | POA: Diagnosis not present

## 2018-01-30 DIAGNOSIS — E7849 Other hyperlipidemia: Secondary | ICD-10-CM | POA: Diagnosis not present

## 2018-01-30 DIAGNOSIS — Z0000 Encounter for general adult medical examination without abnormal findings: Secondary | ICD-10-CM | POA: Insufficient documentation

## 2018-01-30 DIAGNOSIS — I10 Essential (primary) hypertension: Secondary | ICD-10-CM | POA: Diagnosis not present

## 2018-01-30 DIAGNOSIS — R7309 Other abnormal glucose: Secondary | ICD-10-CM | POA: Diagnosis not present

## 2018-02-05 ENCOUNTER — Encounter: Payer: Self-pay | Admitting: Internal Medicine

## 2018-02-06 DIAGNOSIS — E559 Vitamin D deficiency, unspecified: Secondary | ICD-10-CM | POA: Diagnosis not present

## 2018-02-06 DIAGNOSIS — J45998 Other asthma: Secondary | ICD-10-CM | POA: Diagnosis not present

## 2018-02-06 DIAGNOSIS — L0889 Other specified local infections of the skin and subcutaneous tissue: Secondary | ICD-10-CM | POA: Diagnosis not present

## 2018-02-06 DIAGNOSIS — R7989 Other specified abnormal findings of blood chemistry: Secondary | ICD-10-CM | POA: Diagnosis not present

## 2018-02-06 DIAGNOSIS — Z8249 Family history of ischemic heart disease and other diseases of the circulatory system: Secondary | ICD-10-CM | POA: Diagnosis not present

## 2018-02-06 DIAGNOSIS — K219 Gastro-esophageal reflux disease without esophagitis: Secondary | ICD-10-CM | POA: Diagnosis not present

## 2018-02-06 DIAGNOSIS — G4709 Other insomnia: Secondary | ICD-10-CM | POA: Diagnosis not present

## 2018-02-06 DIAGNOSIS — R69 Illness, unspecified: Secondary | ICD-10-CM | POA: Diagnosis not present

## 2018-02-06 DIAGNOSIS — Z0000 Encounter for general adult medical examination without abnormal findings: Secondary | ICD-10-CM | POA: Diagnosis not present

## 2018-02-06 DIAGNOSIS — R748 Abnormal levels of other serum enzymes: Secondary | ICD-10-CM | POA: Diagnosis not present

## 2018-02-11 DIAGNOSIS — L0231 Cutaneous abscess of buttock: Secondary | ICD-10-CM | POA: Diagnosis not present

## 2018-03-24 DIAGNOSIS — H524 Presbyopia: Secondary | ICD-10-CM | POA: Diagnosis not present

## 2018-03-24 DIAGNOSIS — H35033 Hypertensive retinopathy, bilateral: Secondary | ICD-10-CM | POA: Diagnosis not present

## 2018-03-24 DIAGNOSIS — H25013 Cortical age-related cataract, bilateral: Secondary | ICD-10-CM | POA: Diagnosis not present

## 2018-03-24 DIAGNOSIS — H2513 Age-related nuclear cataract, bilateral: Secondary | ICD-10-CM | POA: Diagnosis not present

## 2018-04-01 ENCOUNTER — Encounter: Payer: Self-pay | Admitting: Gastroenterology

## 2018-04-03 DIAGNOSIS — E663 Overweight: Secondary | ICD-10-CM | POA: Diagnosis not present

## 2018-04-03 DIAGNOSIS — Z79899 Other long term (current) drug therapy: Secondary | ICD-10-CM | POA: Diagnosis not present

## 2018-04-03 DIAGNOSIS — Z6827 Body mass index (BMI) 27.0-27.9, adult: Secondary | ICD-10-CM | POA: Diagnosis not present

## 2018-04-03 DIAGNOSIS — R768 Other specified abnormal immunological findings in serum: Secondary | ICD-10-CM | POA: Diagnosis not present

## 2018-04-03 DIAGNOSIS — M150 Primary generalized (osteo)arthritis: Secondary | ICD-10-CM | POA: Diagnosis not present

## 2018-04-03 DIAGNOSIS — M0579 Rheumatoid arthritis with rheumatoid factor of multiple sites without organ or systems involvement: Secondary | ICD-10-CM | POA: Diagnosis not present

## 2018-04-03 DIAGNOSIS — M2550 Pain in unspecified joint: Secondary | ICD-10-CM | POA: Diagnosis not present

## 2018-04-09 ENCOUNTER — Other Ambulatory Visit: Payer: Self-pay | Admitting: Rheumatology

## 2018-04-09 DIAGNOSIS — R945 Abnormal results of liver function studies: Principal | ICD-10-CM

## 2018-04-09 DIAGNOSIS — R7989 Other specified abnormal findings of blood chemistry: Secondary | ICD-10-CM

## 2018-04-21 ENCOUNTER — Ambulatory Visit
Admission: RE | Admit: 2018-04-21 | Discharge: 2018-04-21 | Disposition: A | Discharge status: Home / Self Care | Payer: Medicare HMO | Source: Ambulatory Visit | Attending: Rheumatology | Admitting: Rheumatology

## 2018-04-21 DIAGNOSIS — R945 Abnormal results of liver function studies: Secondary | ICD-10-CM | POA: Diagnosis not present

## 2018-04-21 DIAGNOSIS — R7989 Other specified abnormal findings of blood chemistry: Secondary | ICD-10-CM

## 2018-05-27 DIAGNOSIS — Z79899 Other long term (current) drug therapy: Secondary | ICD-10-CM | POA: Diagnosis not present

## 2018-05-27 DIAGNOSIS — M0579 Rheumatoid arthritis with rheumatoid factor of multiple sites without organ or systems involvement: Secondary | ICD-10-CM | POA: Diagnosis not present

## 2018-05-30 DIAGNOSIS — M545 Low back pain: Secondary | ICD-10-CM | POA: Diagnosis not present

## 2018-05-30 DIAGNOSIS — M25571 Pain in right ankle and joints of right foot: Secondary | ICD-10-CM | POA: Diagnosis not present

## 2018-05-30 DIAGNOSIS — M25551 Pain in right hip: Secondary | ICD-10-CM | POA: Diagnosis not present

## 2018-06-04 DIAGNOSIS — S76311D Strain of muscle, fascia and tendon of the posterior muscle group at thigh level, right thigh, subsequent encounter: Secondary | ICD-10-CM | POA: Diagnosis not present

## 2018-06-05 DIAGNOSIS — S76311D Strain of muscle, fascia and tendon of the posterior muscle group at thigh level, right thigh, subsequent encounter: Secondary | ICD-10-CM | POA: Diagnosis not present

## 2018-06-10 DIAGNOSIS — S76311D Strain of muscle, fascia and tendon of the posterior muscle group at thigh level, right thigh, subsequent encounter: Secondary | ICD-10-CM | POA: Diagnosis not present

## 2018-06-13 DIAGNOSIS — S76311D Strain of muscle, fascia and tendon of the posterior muscle group at thigh level, right thigh, subsequent encounter: Secondary | ICD-10-CM | POA: Diagnosis not present

## 2018-06-17 DIAGNOSIS — R69 Illness, unspecified: Secondary | ICD-10-CM | POA: Diagnosis not present

## 2018-06-17 DIAGNOSIS — S76311D Strain of muscle, fascia and tendon of the posterior muscle group at thigh level, right thigh, subsequent encounter: Secondary | ICD-10-CM | POA: Diagnosis not present

## 2018-06-19 DIAGNOSIS — S76311D Strain of muscle, fascia and tendon of the posterior muscle group at thigh level, right thigh, subsequent encounter: Secondary | ICD-10-CM | POA: Diagnosis not present

## 2018-06-23 DIAGNOSIS — M150 Primary generalized (osteo)arthritis: Secondary | ICD-10-CM | POA: Diagnosis not present

## 2018-06-23 DIAGNOSIS — Z79899 Other long term (current) drug therapy: Secondary | ICD-10-CM | POA: Diagnosis not present

## 2018-06-23 DIAGNOSIS — M0579 Rheumatoid arthritis with rheumatoid factor of multiple sites without organ or systems involvement: Secondary | ICD-10-CM | POA: Diagnosis not present

## 2018-06-23 DIAGNOSIS — M2550 Pain in unspecified joint: Secondary | ICD-10-CM | POA: Diagnosis not present

## 2018-06-24 DIAGNOSIS — S76311D Strain of muscle, fascia and tendon of the posterior muscle group at thigh level, right thigh, subsequent encounter: Secondary | ICD-10-CM | POA: Diagnosis not present

## 2018-06-26 DIAGNOSIS — S76311D Strain of muscle, fascia and tendon of the posterior muscle group at thigh level, right thigh, subsequent encounter: Secondary | ICD-10-CM | POA: Diagnosis not present

## 2018-06-27 DIAGNOSIS — S76311D Strain of muscle, fascia and tendon of the posterior muscle group at thigh level, right thigh, subsequent encounter: Secondary | ICD-10-CM | POA: Diagnosis not present

## 2018-07-24 DIAGNOSIS — H903 Sensorineural hearing loss, bilateral: Secondary | ICD-10-CM | POA: Diagnosis not present

## 2018-08-16 DIAGNOSIS — Z23 Encounter for immunization: Secondary | ICD-10-CM | POA: Diagnosis not present

## 2018-09-01 DIAGNOSIS — L821 Other seborrheic keratosis: Secondary | ICD-10-CM | POA: Diagnosis not present

## 2018-09-01 DIAGNOSIS — L812 Freckles: Secondary | ICD-10-CM | POA: Diagnosis not present

## 2018-09-23 DIAGNOSIS — E663 Overweight: Secondary | ICD-10-CM | POA: Diagnosis not present

## 2018-09-23 DIAGNOSIS — M0579 Rheumatoid arthritis with rheumatoid factor of multiple sites without organ or systems involvement: Secondary | ICD-10-CM | POA: Diagnosis not present

## 2018-09-23 DIAGNOSIS — Z6829 Body mass index (BMI) 29.0-29.9, adult: Secondary | ICD-10-CM | POA: Diagnosis not present

## 2018-09-23 DIAGNOSIS — M150 Primary generalized (osteo)arthritis: Secondary | ICD-10-CM | POA: Diagnosis not present

## 2018-09-23 DIAGNOSIS — M2550 Pain in unspecified joint: Secondary | ICD-10-CM | POA: Diagnosis not present

## 2018-09-23 DIAGNOSIS — Z79899 Other long term (current) drug therapy: Secondary | ICD-10-CM | POA: Diagnosis not present

## 2018-09-28 DIAGNOSIS — J0140 Acute pansinusitis, unspecified: Secondary | ICD-10-CM | POA: Diagnosis not present

## 2018-10-21 DIAGNOSIS — M0579 Rheumatoid arthritis with rheumatoid factor of multiple sites without organ or systems involvement: Secondary | ICD-10-CM | POA: Diagnosis not present

## 2018-11-26 DIAGNOSIS — Z6829 Body mass index (BMI) 29.0-29.9, adult: Secondary | ICD-10-CM | POA: Diagnosis not present

## 2018-11-26 DIAGNOSIS — M2550 Pain in unspecified joint: Secondary | ICD-10-CM | POA: Diagnosis not present

## 2018-11-26 DIAGNOSIS — E663 Overweight: Secondary | ICD-10-CM | POA: Diagnosis not present

## 2018-11-26 DIAGNOSIS — K760 Fatty (change of) liver, not elsewhere classified: Secondary | ICD-10-CM | POA: Diagnosis not present

## 2018-11-26 DIAGNOSIS — Z79899 Other long term (current) drug therapy: Secondary | ICD-10-CM | POA: Diagnosis not present

## 2018-11-26 DIAGNOSIS — M150 Primary generalized (osteo)arthritis: Secondary | ICD-10-CM | POA: Diagnosis not present

## 2018-11-26 DIAGNOSIS — M0579 Rheumatoid arthritis with rheumatoid factor of multiple sites without organ or systems involvement: Secondary | ICD-10-CM | POA: Diagnosis not present

## 2018-12-10 DIAGNOSIS — H35033 Hypertensive retinopathy, bilateral: Secondary | ICD-10-CM | POA: Diagnosis not present

## 2018-12-10 DIAGNOSIS — H2513 Age-related nuclear cataract, bilateral: Secondary | ICD-10-CM | POA: Diagnosis not present

## 2018-12-10 DIAGNOSIS — H169 Unspecified keratitis: Secondary | ICD-10-CM | POA: Diagnosis not present

## 2018-12-10 DIAGNOSIS — H25013 Cortical age-related cataract, bilateral: Secondary | ICD-10-CM | POA: Diagnosis not present

## 2018-12-21 DIAGNOSIS — J0140 Acute pansinusitis, unspecified: Secondary | ICD-10-CM | POA: Diagnosis not present

## 2018-12-21 DIAGNOSIS — J029 Acute pharyngitis, unspecified: Secondary | ICD-10-CM | POA: Diagnosis not present

## 2018-12-21 DIAGNOSIS — R05 Cough: Secondary | ICD-10-CM | POA: Diagnosis not present

## 2018-12-21 DIAGNOSIS — R5383 Other fatigue: Secondary | ICD-10-CM | POA: Diagnosis not present

## 2018-12-24 DIAGNOSIS — I10 Essential (primary) hypertension: Secondary | ICD-10-CM | POA: Diagnosis not present

## 2018-12-24 DIAGNOSIS — Z6828 Body mass index (BMI) 28.0-28.9, adult: Secondary | ICD-10-CM | POA: Diagnosis not present

## 2018-12-24 DIAGNOSIS — R05 Cough: Secondary | ICD-10-CM | POA: Diagnosis not present

## 2018-12-24 DIAGNOSIS — J40 Bronchitis, not specified as acute or chronic: Secondary | ICD-10-CM | POA: Diagnosis not present

## 2019-02-11 DIAGNOSIS — M2550 Pain in unspecified joint: Secondary | ICD-10-CM | POA: Diagnosis not present

## 2019-02-11 DIAGNOSIS — M150 Primary generalized (osteo)arthritis: Secondary | ICD-10-CM | POA: Diagnosis not present

## 2019-02-11 DIAGNOSIS — M0579 Rheumatoid arthritis with rheumatoid factor of multiple sites without organ or systems involvement: Secondary | ICD-10-CM | POA: Diagnosis not present

## 2019-02-11 DIAGNOSIS — Z79899 Other long term (current) drug therapy: Secondary | ICD-10-CM | POA: Diagnosis not present

## 2019-03-18 DIAGNOSIS — E7849 Other hyperlipidemia: Secondary | ICD-10-CM | POA: Diagnosis not present

## 2019-03-18 DIAGNOSIS — E559 Vitamin D deficiency, unspecified: Secondary | ICD-10-CM | POA: Diagnosis not present

## 2019-03-18 DIAGNOSIS — I10 Essential (primary) hypertension: Secondary | ICD-10-CM | POA: Diagnosis not present

## 2019-03-20 DIAGNOSIS — R82998 Other abnormal findings in urine: Secondary | ICD-10-CM | POA: Diagnosis not present

## 2019-03-20 DIAGNOSIS — I10 Essential (primary) hypertension: Secondary | ICD-10-CM | POA: Diagnosis not present

## 2019-03-23 DIAGNOSIS — M150 Primary generalized (osteo)arthritis: Secondary | ICD-10-CM | POA: Diagnosis not present

## 2019-03-23 DIAGNOSIS — M2550 Pain in unspecified joint: Secondary | ICD-10-CM | POA: Diagnosis not present

## 2019-03-23 DIAGNOSIS — Z79899 Other long term (current) drug therapy: Secondary | ICD-10-CM | POA: Diagnosis not present

## 2019-03-23 DIAGNOSIS — M0579 Rheumatoid arthritis with rheumatoid factor of multiple sites without organ or systems involvement: Secondary | ICD-10-CM | POA: Diagnosis not present

## 2019-03-25 DIAGNOSIS — Z8249 Family history of ischemic heart disease and other diseases of the circulatory system: Secondary | ICD-10-CM | POA: Diagnosis not present

## 2019-03-25 DIAGNOSIS — Z1331 Encounter for screening for depression: Secondary | ICD-10-CM | POA: Diagnosis not present

## 2019-03-25 DIAGNOSIS — M791 Myalgia, unspecified site: Secondary | ICD-10-CM | POA: Diagnosis not present

## 2019-03-25 DIAGNOSIS — F330 Major depressive disorder, recurrent, mild: Secondary | ICD-10-CM | POA: Diagnosis not present

## 2019-03-25 DIAGNOSIS — I10 Essential (primary) hypertension: Secondary | ICD-10-CM | POA: Diagnosis not present

## 2019-03-25 DIAGNOSIS — Z0000 Encounter for general adult medical examination without abnormal findings: Secondary | ICD-10-CM | POA: Diagnosis not present

## 2019-03-25 DIAGNOSIS — M069 Rheumatoid arthritis, unspecified: Secondary | ICD-10-CM | POA: Diagnosis not present

## 2019-03-25 DIAGNOSIS — J45909 Unspecified asthma, uncomplicated: Secondary | ICD-10-CM | POA: Diagnosis not present

## 2019-03-25 DIAGNOSIS — M1990 Unspecified osteoarthritis, unspecified site: Secondary | ICD-10-CM | POA: Diagnosis not present

## 2019-03-25 DIAGNOSIS — R7309 Other abnormal glucose: Secondary | ICD-10-CM | POA: Diagnosis not present

## 2019-03-25 DIAGNOSIS — D509 Iron deficiency anemia, unspecified: Secondary | ICD-10-CM | POA: Diagnosis not present

## 2019-03-25 DIAGNOSIS — E785 Hyperlipidemia, unspecified: Secondary | ICD-10-CM | POA: Diagnosis not present

## 2019-03-26 DIAGNOSIS — E7849 Other hyperlipidemia: Secondary | ICD-10-CM | POA: Diagnosis not present

## 2019-03-26 DIAGNOSIS — R5383 Other fatigue: Secondary | ICD-10-CM | POA: Diagnosis not present

## 2019-03-26 DIAGNOSIS — M791 Myalgia, unspecified site: Secondary | ICD-10-CM | POA: Diagnosis not present

## 2019-03-26 DIAGNOSIS — R0600 Dyspnea, unspecified: Secondary | ICD-10-CM | POA: Diagnosis not present

## 2019-03-31 DIAGNOSIS — G729 Myopathy, unspecified: Secondary | ICD-10-CM | POA: Insufficient documentation

## 2019-04-06 ENCOUNTER — Other Ambulatory Visit: Payer: Self-pay

## 2019-04-06 ENCOUNTER — Telehealth (INDEPENDENT_AMBULATORY_CARE_PROVIDER_SITE_OTHER): Payer: PPO | Admitting: Neurology

## 2019-04-06 ENCOUNTER — Encounter: Payer: Self-pay | Admitting: Neurology

## 2019-04-06 VITALS — BP 125/75 | Ht 62.0 in | Wt 155.0 lb

## 2019-04-06 DIAGNOSIS — R0609 Other forms of dyspnea: Secondary | ICD-10-CM

## 2019-04-06 DIAGNOSIS — R0600 Dyspnea, unspecified: Secondary | ICD-10-CM

## 2019-04-06 DIAGNOSIS — R5383 Other fatigue: Secondary | ICD-10-CM

## 2019-04-06 DIAGNOSIS — M79605 Pain in left leg: Secondary | ICD-10-CM

## 2019-04-06 DIAGNOSIS — M79604 Pain in right leg: Secondary | ICD-10-CM

## 2019-04-06 NOTE — Progress Notes (Signed)
New Patient Virtual Visit via Video Note The purpose of this virtual visit is to provide medical care while limiting exposure to the novel coronavirus.    Consent was obtained for video visit:  Yes.   Answered questions that patient had about telehealth interaction:  Yes.   I discussed the limitations, risks, security and privacy concerns of performing an evaluation and management service by telemedicine. I also discussed with the patient that there may be a patient responsible charge related to this service. The patient expressed understanding and agreed to proceed.  Pt location: Home Physician Location: office Name of referring provider:  Velna Hatchet, MD I connected with Morningstar Hassinger at patients initiation/request on 04/06/2019 at  2:00 PM EDT by video enabled telemedicine application and verified that I am speaking with the correct person using two identifiers. Pt MRN:  [id number] Pt DOB:  06-23-1951 Video Participants:  Benjamine Mola Mcelhannon    History of Present Illness: Sham Alviar is a 68 y.o. right-handed Caucasian female with rheumatoid arthritis, osteoarthritis, hypertension, hyperlipidemia, anxiety/depression, GERD, and asthma presenting for evaluation of myalgias.   In March 2020, she was on vacation in [place] and was able to walk/hike at the [facility] without any leg pain or shortness of breath.  When she arrived home, she began noticing that she was unable to walk as long.  Further, she began getting increasingly exhausted, lack of energy, short of breath especially with exertion.  She previously could walk several miles and now struggles to walk a mile because of leg pain.  Pain is described as has achy/sore pain in her thigh and calf which is severe at times, such that she is unable to take another step.  If she rests, her leg pain very quickly start to resolve. Within a minute, she is feeling well enough to resume walking. Right leg is worse.  There is no  numbness/tingling in the legs or associated back pain.  She does not have tenderness of the muscles and feels that her pain is deep.  Recent lab testing includes essentially normal inflammatory markers (mildly elevated ESR 25), CK, vitamin B12, folate, TSH, and CoV2 antibody.  She has not had recent cardiac stress testing or ABI.   She was seen by Dr. Gavin Pound on 03/24/2019 for RA and received DepoMedrol injection due to joint pain.  She takes MTX for RA.  She did not appreciate any improvement of leg pain.   She denies ptosis, double vision, difficulty swallowing/talking, or limb weakness.    Out-side paper records, electronic medical record, and images have been reviewed where available and summarized as:  Labs 03/26/2023 9 SARS-CoV-2 antibody negative, ESR 25, free T4 0.9, vitamin B12 1011, folate >20, CK 104, C-reactive protein < 1 Labs 01/30/2018: 5.3 Labs 03/18/2019: TSH 3.29, creatinine 0.9, sodium 140, potassium 4.3, chloride 104, LFTs normal   Past Medical History:  Diagnosis Date  . Anemia   . Anxiety   . Arthritis   . Asthma   . Colon polyps   . Complication of anesthesia    "sometimes I dont like to wake up after " ie difficult to awaken   . Depression   . Diverticulitis   . Diverticulosis   . GERD (gastroesophageal reflux disease)   . HLD (hyperlipidemia)   . HTN (hypertension)   . IBS (irritable bowel syndrome)   . Insomnia   . MRSA infection   . Osteoarthritis   . PONV (postoperative nausea and vomiting)   . Rheumatoid arthritis (  [facility])   . Seasonal allergies   . Shingles   . Status post dilation of esophageal narrowing   . Vitamin D deficiency     Past Surgical History:  Procedure Laterality Date  . APPENDECTOMY  1975  . bladder tack  1990  . CHOLECYSTECTOMY N/A 01/31/2017   Procedure: LAPAROSCOPIC CHOLECYSTECTOMY WITH INTRAOPERATIVE CHOLANGIOGRAM;  Surgeon: Armandina Gemma, MD;  Location: [facility];  Service: General;  Laterality: N/A;  . INCISION AND DRAINAGE  PERIRECTAL ABSCESS N/A 12/02/2017   Procedure: IRRIGATION AND DEBRIDEMENT PERINEAL ABSCESS;  Surgeon: Greer Pickerel, MD;  Location: [facility];  Service: General;  Laterality: N/A;  . MENISCUS REPAIR Right 2013  . ROTATOR CUFF REPAIR Right 2005  . TONSILLECTOMY     as a child  . TOTAL ABDOMINAL HYSTERECTOMY  1988  . TOTAL KNEE ARTHROPLASTY Left   . WRIST RECONSTRUCTION Right 2008     Medications:  Outpatient Encounter Medications as of 04/06/2019  Medication Sig Note  . amLODipine (NORVASC) 10 MG tablet Take 10 mg by mouth daily.    Marland Kitchen aspirin 81 MG tablet Take 81 mg by mouth daily.   . Cholecalciferol (VITAMIN D3) 10000 units TABS Take 1 tablet by mouth once a week. 01/25/2017: Sundays  . folic acid (FOLVITE) 1 MG tablet Take 2 mg by mouth daily.   . hydrochlorothiazide (HYDRODIURIL) 25 MG tablet Take 25 mg by mouth daily.   Marland Kitchen losartan (COZAAR) 100 MG tablet Take 100 mg by mouth daily.   Marland Kitchen MELATONIN PO Take 1 tablet by mouth at bedtime as needed.    . meloxicam (MOBIC) 15 MG tablet Take 15 mg by mouth daily.   . methotrexate (RHEUMATREX) 2.5 MG tablet Take 2.5 mg by mouth once a week. Caution:Chemotherapy. Protect from light.  Takes 8 tablets weekly   . Multiple Vitamin (MULTIVITAMIN) tablet Take 1 tablet by mouth daily.   . NON FORMULARY Place under the tongue at bedtime. CBD Oil- nightly   . Omega-3 Fatty Acids (FISH OIL) 1000 MG CAPS Take 1 capsule by mouth 2 (two) times daily.   Marland Kitchen omeprazole (PRILOSEC) 20 MG capsule Take 20 mg daily by mouth.   . polyethylene glycol (MIRALAX / GLYCOLAX) 17 g packet Take 17 g by mouth daily as needed. Taking three times weekly   . PSYLLIUM PO Take 1 packet by mouth daily.   . valACYclovir (VALTREX) 500 MG tablet Take 500 mg by mouth 2 (two) times daily as needed.   . [DISCONTINUED] clotrimazole-betamethasone (LOTRISONE) cream Apply 1 application topically 2 (two) times daily. (Patient not taking: Reported on 01/27/2018)   . [DISCONTINUED] docusate sodium  (COLACE) 100 MG capsule Take 1 capsule (100 mg total) by mouth 2 (two) times daily.   . [DISCONTINUED] gabapentin (NEURONTIN) 600 MG tablet Take 0.5 tablets (300 mg total) by mouth 2 (two) times daily.    No facility-administered encounter medications on file as of 04/06/2019.     Allergies:  Allergies  Allergen Reactions  . Demerol [Meperidine] Nausea Only  . Nickel   . Tape Rash    Family History: Family History  Problem Relation Age of Onset  . Colon polyps Mother   . Diabetes Mother   . Heart disease Mother   . Kidney cancer Mother   . Arthritis Mother   . Hypertension Mother   . Hyperlipidemia Mother   . Colon polyps Son   . CAD Brother   . Dementia Father     Social History: Social History  Tobacco Use  . Smoking status: Never Smoker  . Smokeless tobacco: Never Used  Substance Use Topics  . Alcohol use: Yes    Alcohol/week: 7.0 standard drinks    Types: 7 Standard drinks or equivalent per week  . Drug use: No   Social History   Social History Narrative   Retired from [employer]   Lives at home with husband   Highest level of education:  Some college    Review of Systems:  CONSTITUTIONAL: No fevers, chills, night sweats, or weight loss.   EYES: No visual changes or eye pain ENT: No hearing changes.  No history of nose bleeds.   RESPIRATORY: No cough, wheezing +shortness of breath.   CARDIOVASCULAR: Negative for chest pain, and palpitations.   GI: Negative for abdominal discomfort, blood in stools or black stools.  No recent change in bowel habits.   GU:  No history of incontinence.   MUSCLOSKELETAL: +history of joint pain or swelling.  +myalgias.   SKIN: Negative for lesions, rash, and itching.   HEMATOLOGY/ONCOLOGY: Negative for prolonged bleeding, bruising easily, and swollen nodes.  No history of cancer.   ENDOCRINE: Negative for cold or heat intolerance, polydipsia or goiter.   PSYCH:  No depression or anxiety symptoms.   NEURO: As [facility].   Vital  Signs:  BP 125/75   Ht '5\' 2"'  (1.575 m)   Wt 155 lb (70.3 kg)   BMI 28.35 kg/m    General Medical Exam:  Well appearing, comfortable.  Nonlabored breathing.  No deformity or edema.  No rash.  Neurological Exam: MENTAL STATUS including orientation to time, place, person, recent and remote memory, attention span and concentration, language, and fund of knowledge is normal.  Speech is not dysarthric.  CRANIAL NERVES:  Normal conjugate, extra-ocular eye movements in all directions of gaze.  No ptosis.  Normal facial symmetry and movements.  Normal shoulder shrug and head rotation.  Tongue is midline.  MOTOR:  Antigravity in all extremities.  No abnormal movements.  No pronator drift.   SENSORY/REFLEXES:  Unable to assess    COORDINATION/GAIT: Normal finger to nose bilaterally.  Intact rapid alternating movements bilaterally.  Able to rise from a chair without using arms.  Gait narrow based and stable. Tandem gait intact.    IMPRESSION: New onset exertional leg pain, exertional dyspnea and generalized fatigue.  Symptoms are not characteristic for primary neuromuscular condition and the likelihood of myopathy is very low with normal CK.   I am moreso concerned about her dyspnea and fatigue and recommend that she see cardiology for evaluation and consideration for cardiac stress testing.  Exertional leg pain is not associated weakness, back pain or leg paresthesias.  Doubt neurogenic claudication, neuropathy, or myopathy.  She may need ABI when she sees cardiology.  Unfortunately, physical exam is very limited due this being a televisit.  If her cardiac evaluation returns negative, she will return for formal neuromuscular exam and NCS/EMG of the legs.     Follow Up Instructions:  I discussed the assessment and treatment plan with the patient. The patient was provided an opportunity to ask questions and all were answered. The patient agreed with the plan and demonstrated an understanding of the  instructions.   The patient was advised to call back or seek an in-person evaluation if the symptoms worsen or if the condition fails to improve as anticipated.   Alda Berthold, DO

## 2019-04-07 ENCOUNTER — Telehealth: Payer: Medicare HMO | Admitting: Neurology

## 2019-04-09 ENCOUNTER — Ambulatory Visit (INDEPENDENT_AMBULATORY_CARE_PROVIDER_SITE_OTHER): Payer: PPO | Admitting: Cardiology

## 2019-04-09 ENCOUNTER — Encounter: Payer: Self-pay | Admitting: Cardiology

## 2019-04-09 ENCOUNTER — Other Ambulatory Visit: Payer: Self-pay

## 2019-04-09 VITALS — BP 164/77 | HR 74 | Temp 97.1°F | Ht 63.0 in | Wt 156.0 lb

## 2019-04-09 DIAGNOSIS — I10 Essential (primary) hypertension: Secondary | ICD-10-CM

## 2019-04-09 DIAGNOSIS — R0609 Other forms of dyspnea: Secondary | ICD-10-CM

## 2019-04-09 DIAGNOSIS — I739 Peripheral vascular disease, unspecified: Secondary | ICD-10-CM | POA: Insufficient documentation

## 2019-04-09 DIAGNOSIS — R079 Chest pain, unspecified: Secondary | ICD-10-CM | POA: Diagnosis not present

## 2019-04-09 DIAGNOSIS — R0600 Dyspnea, unspecified: Secondary | ICD-10-CM | POA: Insufficient documentation

## 2019-04-09 HISTORY — DX: Other forms of dyspnea: R06.09

## 2019-04-09 HISTORY — DX: Peripheral vascular disease, unspecified: I73.9

## 2019-04-09 HISTORY — DX: Chest pain, unspecified: R07.9

## 2019-04-09 NOTE — Progress Notes (Signed)
Patient referred by Alda Berthold, DO for leg pain, exertional dyspnea.    Subjective:   Courtney Crane, female    DOB: 1951/04/21, 68 y.o.   MRN: [id number]   Chief Complaint  Patient presents with  . New Patient (Initial Visit)  . Shortness of Breath  . Fatigue  . Leg Pain    HPI  68 year old Caucasian female with hypertension, hyperlipidemia, rheumatoid arthritis, osteoarthritis, anxiety depression, GERD, asthma, referred by Narda Amber, DO for evaluation of leg pain, exertional dyspnea.   Patient is retired, lives her husband and stays busy taking care of grandchildren.  Prior to cover pandemic, she had been fairly active.  While she does not exercise regularly, she and her husband used to walk up to 3 miles without difficulty. She and her husband took a trip to [employer], [facility] etc. where they hiked a fair amount.  However, since her return from that trip, she has noticed several new symptoms.  She reports exertional dyspnea, with even up to a mile of walking, which improves with rest.  She is also noticed chest pressure which is fairly constant but does worsen with activity, never resolves even with rest.  She is also noticed pain in both her again occurs with walking, and improves with rest.  However, for last few days, she has noticed burning pain even at rest.  She denies orthopnea, PND, leg edema, presyncope or syncope.  Her blood pressure is elevated today.  However, patient reports that her blood pressure at home is usually 130s/80s mmHg, and is generally always elevated at doctor offices.  Patient has history of elevated liver enzymes.  She tells me that it was decided to treat her hyperlipidemia without statins, and with diet and lifestyle modifications.  She recently underwent lipid panel and liver enzyme testing through Dr. Ardeth Perfect and Dr. Trudie Reed.  Both these panels were reportedly improved.   Past Medical History:  Diagnosis Date  . Anemia    . Anxiety   . Arthritis   . Asthma   . Colon polyps   . Complication of anesthesia    "sometimes I dont like to wake up after " ie difficult to awaken   . Depression   . Diverticulitis   . Diverticulosis   . GERD (gastroesophageal reflux disease)   . HLD (hyperlipidemia)   . HTN (hypertension)   . IBS (irritable bowel syndrome)   . Insomnia   . MRSA infection   . Osteoarthritis   . PONV (postoperative nausea and vomiting)   . Rheumatoid arthritis ([place])   . Seasonal allergies   . Shingles   . Status post dilation of esophageal narrowing   . Vitamin D deficiency      Past Surgical History:  Procedure Laterality Date  . APPENDECTOMY  1975  . bladder tack  1990  . CHOLECYSTECTOMY N/A 01/31/2017   Procedure: LAPAROSCOPIC CHOLECYSTECTOMY WITH INTRAOPERATIVE CHOLANGIOGRAM;  Surgeon: Armandina Gemma, MD;  Location: [facility];  Service: General;  Laterality: N/A;  . INCISION AND DRAINAGE PERIRECTAL ABSCESS N/A 12/02/2017   Procedure: IRRIGATION AND DEBRIDEMENT PERINEAL ABSCESS;  Surgeon: Greer Pickerel, MD;  Location: [facility];  Service: General;  Laterality: N/A;  . MENISCUS REPAIR Right 2013  . ROTATOR CUFF REPAIR Right 2005  . TONSILLECTOMY     as a child  . TOTAL ABDOMINAL HYSTERECTOMY  1988  . TOTAL KNEE ARTHROPLASTY Left   . WRIST RECONSTRUCTION Right 2008     Social History   Socioeconomic  History  . Marital status: Married    Spouse name: Not on file  . Number of children: 2  . Years of education: Not on file  . Highest education level: Not on file  Occupational History  . Occupation: retired  Scientific laboratory technician  . Financial resource strain: Not on file  . Food insecurity:    Worry: Not on file    Inability: Not on file  . Transportation needs:    Medical: Not on file    Non-medical: Not on file  Tobacco Use  . Smoking status: Never Smoker  . Smokeless tobacco: Never Used  Substance and Sexual Activity  . Alcohol use: Yes    Alcohol/week: 7.0 standard drinks    Types:  7 Standard drinks or equivalent per week  . Drug use: No  . Sexual activity: Not on file  Lifestyle  . Physical activity:    Days per week: Not on file    Minutes per session: Not on file  . Stress: Not on file  Relationships  . Social connections:    Talks on phone: Not on file    Gets together: Not on file    Attends religious service: Not on file    Active member of club or organization: Not on file    Attends meetings of clubs or organizations: Not on file    Relationship status: Not on file  . Intimate partner violence:    Fear of current or ex partner: Not on file    Emotionally abused: Not on file    Physically abused: Not on file    Forced sexual activity: Not on file  Other Topics Concern  . Not on file  Social History Narrative   Retired from [employer]   Lives at home with husband   Highest level of education:  Some college     Family History  Problem Relation Age of Onset  . Colon polyps Mother   . Diabetes Mother   . Heart disease Mother   . Kidney cancer Mother   . Arthritis Mother   . Hypertension Mother   . Hyperlipidemia Mother   . Colon polyps Son   . CAD Brother   . Dementia Father      Current Outpatient Medications on File Prior to Visit  Medication Sig Dispense Refill  . amLODipine (NORVASC) 10 MG tablet Take 10 mg by mouth daily.     Marland Kitchen aspirin 81 MG tablet Take 81 mg by mouth daily.    . Cholecalciferol (VITAMIN D3) 10000 units TABS Take 1 tablet by mouth once a week.    . folic acid (FOLVITE) 1 MG tablet Take 2 mg by mouth daily.    . hydrochlorothiazide (HYDRODIURIL) 25 MG tablet Take 25 mg by mouth daily.    Marland Kitchen losartan (COZAAR) 100 MG tablet Take 100 mg by mouth daily.    Marland Kitchen MELATONIN PO Take 1 tablet by mouth at bedtime as needed.     . meloxicam (MOBIC) 15 MG tablet Take 15 mg by mouth daily.    . methotrexate (RHEUMATREX) 2.5 MG tablet Take 2.5 mg by mouth once a week. Caution:Chemotherapy. Protect from light.  Takes 8 tablets weekly     . Multiple Vitamin (MULTIVITAMIN) tablet Take 1 tablet by mouth daily.    . NON FORMULARY Place under the tongue at bedtime. CBD Oil- nightly    . Omega-3 Fatty Acids (FISH OIL) 1000 MG CAPS Take 1 capsule by mouth 2 (two) times daily.    Marland Kitchen  omeprazole (PRILOSEC) 20 MG capsule Take 20 mg daily by mouth.    . polyethylene glycol (MIRALAX / GLYCOLAX) 17 g packet Take 17 g by mouth daily as needed. Taking three times weekly    . PSYLLIUM PO Take 1 packet by mouth daily.    . valACYclovir (VALTREX) 500 MG tablet Take 500 mg by mouth 2 (two) times daily as needed.     No current facility-administered medications on file prior to visit.     Cardiovascular studies:  EKG 04/09/2019: Sinus rhythm 77 bpm.  Poor R-wave progression. Otherwise normal EKG   Recent labs:  Results for ANDRIENNE, HAVENER (MRN [id number]) as of 04/09/2019 10:48  Ref. Range 12/05/2017 06:28  COMPREHENSIVE METABOLIC PANEL Unknown Rpt (A)  Sodium Latest Ref Range: 135 - 145 mmol/L 139  Potassium Latest Ref Range: 3.5 - 5.1 mmol/L 3.9  Chloride Latest Ref Range: 101 - 111 mmol/L 105  CO2 Latest Ref Range: 22 - 32 mmol/L 24  Glucose Latest Ref Range: 65 - 99 mg/dL 97  BUN Latest Ref Range: 6 - 20 mg/dL 14  Creatinine Latest Ref Range: 0.44 - 1.00 mg/dL 0.82  Calcium Latest Ref Range: 8.9 - 10.3 mg/dL 9.0  Anion gap Latest Ref Range: 5 - 15  10  Phosphorus Latest Ref Range: 2.5 - 4.6 mg/dL 4.2  Magnesium Latest Ref Range: 1.7 - 2.4 mg/dL 2.0  Alkaline Phosphatase Latest Ref Range: 38 - 126 U/L 112  Albumin Latest Ref Range: 3.5 - 5.0 g/dL 2.9 (L)  AST Latest Ref Range: 15 - 41 U/L 162 (H)  ALT Latest Ref Range: 14 - 54 U/L 202 (H)  Total Protein Latest Ref Range: 6.5 - 8.1 g/dL 6.3 (L)  Total Bilirubin Latest Ref Range: 0.3 - 1.2 mg/dL 0.5  GFR, Est Non African American Latest Ref Range: >60 mL/min >60  GFR, Est African American Latest Ref Range: >60 mL/min >60    Results for ADIRA, LIMBURG (MRN [id number]) as of  04/09/2019 10:48  Ref. Range 12/05/2017 06:28  WBC Latest Ref Range: 4.0 - 10.5 K/uL 7.0  RBC Latest Ref Range: 3.87 - 5.11 MIL/uL 3.29 (L)  Hemoglobin Latest Ref Range: 12.0 - 15.0 g/dL 10.8 (L)  HCT Latest Ref Range: 36.0 - 46.0 % 32.4 (L)  MCV Latest Ref Range: 78.0 - 100.0 fL 98.5  MCH Latest Ref Range: 26.0 - 34.0 pg 32.8  MCHC Latest Ref Range: 30.0 - 36.0 g/dL 33.3  RDW Latest Ref Range: 11.5 - 15.5 % 14.9  Platelets Latest Ref Range: 150 - 400 K/uL 199    ROS       Vitals:   04/09/19 1247  BP: (!) 164/77  Pulse: 74  Temp: (!) 97.1 F (36.2 C)  SpO2: 100%    Objective:   Physical Exam  Constitutional: She is oriented to person, place, and time. She appears well-developed and well-nourished. No distress.  HENT:  Head: Normocephalic and atraumatic.  Eyes: Pupils are equal, round, and reactive to light. Conjunctivae are normal.  Neck: No JVD present.  Cardiovascular: Normal rate, regular rhythm and intact distal pulses.  Murmur heard.  Systolic murmur is present with a grade of 1/6 at the upper right sternal border. Pulses:      Femoral pulses are 3+ on the right side and 3+ on the left side.      Popliteal pulses are 2+ on the right side and 2+ on the left side.       Dorsalis pedis pulses are  2+ on the right side and 2+ on the left side.       Posterior tibial pulses are 1+ on the right side and 1+ on the left side.  Loud S1  Pulmonary/Chest: Effort normal and breath sounds normal. She has no wheezes. She has no rales.  Abdominal: Soft. Bowel sounds are normal. There is no rebound.  Musculoskeletal:        General: No edema.  Lymphadenopathy:    She has no cervical adenopathy.  Neurological: She is alert and oriented to person, place, and time. No cranial nerve deficit.  Skin: Skin is warm and dry.  Psychiatric: She has a normal mood and affect.  Nursing note and vitals reviewed.         Assessment & Recommendations:   68 year old Caucasian female with  hypertension, hyperlipidemia, rheumatoid arthritis, osteoarthritis, anxiety depression, GERD, asthma, referred by Narda Amber, DO for evaluation of leg pain, exertional dyspnea.   1. DOE (dyspnea on exertion) Physical exam and EKG unremarkable for etiology of her symptoms.  Will obtain echocardiogram.  2. Exertional chest pain Patient has constant chest pressure at rest for last 4 days.  However, her EKG does not show any signs of ischemia or infarct.  I doubt her resting symptoms are anginal in nature.  However, I am concerned with the exertional nature of her symptoms.  I will obtain nuclear stress test.  Given her history of elevated liver enzymes and reportedly improved lipid panel, I have not started her on statin therapy today.  I will obtain lab results from Dr. Ardeth Perfect and Dr. Trudie Reed' office.  3. Claudication ([place]) Again, her symptoms are out of proportion to physical exam findings.  Nonetheless, I will obtain ABI.  4.  Essential hypertension: Blood pressure elevated today, probably white coat syndrome.  I have not made any changes to her antihypertensive therapy today.    Thank you for referring the patient to [place]. Please feel free to contact with any questions.  Nigel Mormon, MD [facility] Pager: [phone] Office: [phone] If no answer Cell [phone]

## 2019-04-16 ENCOUNTER — Other Ambulatory Visit: Payer: Self-pay

## 2019-04-16 ENCOUNTER — Other Ambulatory Visit: Payer: Self-pay | Admitting: Cardiology

## 2019-04-16 MED ORDER — NITROGLYCERIN 0.4 MG SL SUBL: 0.4000 mg | SUBLINGUAL_TABLET | SUBLINGUAL | 3 refills | Status: DC | PRN

## 2019-04-17 ENCOUNTER — Other Ambulatory Visit: Payer: Self-pay | Admitting: Cardiology

## 2019-04-17 MED ORDER — NITROGLYCERIN 0.4 MG SL SUBL: 0.4000 mg | SUBLINGUAL_TABLET | SUBLINGUAL | 3 refills | Status: DC | PRN

## 2019-04-20 ENCOUNTER — Other Ambulatory Visit: Payer: Self-pay | Admitting: Cardiology

## 2019-04-20 DIAGNOSIS — M0579 Rheumatoid arthritis with rheumatoid factor of multiple sites without organ or systems involvement: Secondary | ICD-10-CM | POA: Diagnosis not present

## 2019-04-20 DIAGNOSIS — Z79899 Other long term (current) drug therapy: Secondary | ICD-10-CM | POA: Diagnosis not present

## 2019-04-20 MED ORDER — NITROGLYCERIN 0.4 MG SL SUBL: 0.4000 mg | SUBLINGUAL_TABLET | SUBLINGUAL | 3 refills | Status: DC | PRN

## 2019-04-22 ENCOUNTER — Other Ambulatory Visit: Payer: Self-pay

## 2019-04-22 NOTE — Patient Outreach (Signed)
[facility]) Care Management  04/22/2019  Miakoda Mcmillion 19-Oct-1951 [id number]    Late Entry  Successful outreach to the patient for the HTA/HRA screening.  HIPAA verified.  The patient reports that she is very active with her physician.  She does not report any issues with her health.  She states that she has an advanced directive in place.  She denies any problems with transportation or medications.  She declines Health coach at this time.  Discussed [facility] services and she agrees to a six month follow up call  Plan:  [facility] will place the patient in [place] with a six month follow up.  RN Health Coach will send a Production assistant, radio.  .[place] will contact patient in the month of October and patient agrees to next outreach.    Lazaro Arms RN, BSN, [place] Direct Dial:  [phone]  Fax: [phone]

## 2019-04-27 ENCOUNTER — Other Ambulatory Visit: Payer: Self-pay

## 2019-04-27 ENCOUNTER — Ambulatory Visit (INDEPENDENT_AMBULATORY_CARE_PROVIDER_SITE_OTHER): Payer: PPO

## 2019-04-27 DIAGNOSIS — I739 Peripheral vascular disease, unspecified: Secondary | ICD-10-CM | POA: Diagnosis not present

## 2019-04-27 DIAGNOSIS — R0609 Other forms of dyspnea: Secondary | ICD-10-CM

## 2019-04-27 DIAGNOSIS — R0600 Dyspnea, unspecified: Secondary | ICD-10-CM

## 2019-04-30 DIAGNOSIS — K642 Third degree hemorrhoids: Secondary | ICD-10-CM | POA: Diagnosis not present

## 2019-04-30 DIAGNOSIS — Z01419 Encounter for gynecological examination (general) (routine) without abnormal findings: Secondary | ICD-10-CM | POA: Diagnosis not present

## 2019-04-30 DIAGNOSIS — Z6827 Body mass index (BMI) 27.0-27.9, adult: Secondary | ICD-10-CM | POA: Diagnosis not present

## 2019-04-30 DIAGNOSIS — B372 Candidiasis of skin and nail: Secondary | ICD-10-CM | POA: Diagnosis not present

## 2019-05-18 DIAGNOSIS — N958 Other specified menopausal and perimenopausal disorders: Secondary | ICD-10-CM | POA: Diagnosis not present

## 2019-05-18 DIAGNOSIS — Z1231 Encounter for screening mammogram for malignant neoplasm of breast: Secondary | ICD-10-CM | POA: Diagnosis not present

## 2019-05-18 DIAGNOSIS — M8588 Other specified disorders of bone density and structure, other site: Secondary | ICD-10-CM | POA: Diagnosis not present

## 2019-05-18 DIAGNOSIS — B354 Tinea corporis: Secondary | ICD-10-CM | POA: Diagnosis not present

## 2019-05-19 ENCOUNTER — Other Ambulatory Visit: Payer: Self-pay

## 2019-05-19 ENCOUNTER — Ambulatory Visit (INDEPENDENT_AMBULATORY_CARE_PROVIDER_SITE_OTHER): Payer: PPO

## 2019-05-19 DIAGNOSIS — R0609 Other forms of dyspnea: Secondary | ICD-10-CM

## 2019-05-19 DIAGNOSIS — R0600 Dyspnea, unspecified: Secondary | ICD-10-CM

## 2019-05-19 DIAGNOSIS — I739 Peripheral vascular disease, unspecified: Secondary | ICD-10-CM | POA: Diagnosis not present

## 2019-05-27 ENCOUNTER — Ambulatory Visit: Payer: PPO | Admitting: Cardiology

## 2019-05-27 ENCOUNTER — Encounter: Payer: Self-pay | Admitting: Cardiology

## 2019-05-27 ENCOUNTER — Other Ambulatory Visit: Payer: Self-pay

## 2019-05-27 VITALS — BP 120/87 | HR 99 | Wt 155.0 lb

## 2019-05-27 DIAGNOSIS — R0609 Other forms of dyspnea: Secondary | ICD-10-CM

## 2019-05-27 DIAGNOSIS — R0789 Other chest pain: Secondary | ICD-10-CM

## 2019-05-27 DIAGNOSIS — R0600 Dyspnea, unspecified: Secondary | ICD-10-CM

## 2019-05-27 DIAGNOSIS — I739 Peripheral vascular disease, unspecified: Secondary | ICD-10-CM

## 2019-05-27 NOTE — Progress Notes (Signed)
Patient referred by Velna Hatchet, MD for leg pain, exertional dyspnea.    Subjective:   Courtney Crane, female    DOB: 26-May-1951, 68 y.o.   MRN: [id number]   No chief complaint on file.   HPI   68 year old Caucasian female with hypertension, hyperlipidemia, rheumatoid arthritis, osteoarthritis, anxiety depression, GERD, asthma, referred by Narda Amber, DO for evaluation of leg pain, exertional dyspnea.   Her workup showed structurally normal heart on echocardiogram with mild MR, no ischemia/infarction on stress test, and only mildly abnormal ABI.   Patients continues to have pain under bilateral breasts. This is more pronounced with certain movements, such as rolling over in bed, or putting dishes away in cabinet above her head. She has not done much physical activity lately, due to the heat. She continues to have shortness of breath and pain in her legs with walking short distances.   Patient reports that she recently underwent bone density test, which showed osteopennia. She is now taking calcium and vit D supplements.    Past Medical History:  Diagnosis Date  . Anemia   . Anxiety   . Arthritis   . Asthma   . Colon polyps   . Complication of anesthesia    "sometimes I dont like to wake up after " ie difficult to awaken   . Depression   . Diverticulitis   . Diverticulosis   . GERD (gastroesophageal reflux disease)   . HLD (hyperlipidemia)   . HTN (hypertension)   . IBS (irritable bowel syndrome)   . Insomnia   . MRSA infection   . Osteoarthritis   . PONV (postoperative nausea and vomiting)   . RA (rheumatoid arthritis) ([place])   . Rheumatoid arthritis ([place])   . Seasonal allergies   . Shingles   . Status post dilation of esophageal narrowing   . Vitamin D deficiency      Past Surgical History:  Procedure Laterality Date  . APPENDECTOMY  1975  . bladder tack  1990  . CHOLECYSTECTOMY N/A 01/31/2017   Procedure: LAPAROSCOPIC CHOLECYSTECTOMY WITH  INTRAOPERATIVE CHOLANGIOGRAM;  Surgeon: Armandina Gemma, MD;  Location: [facility];  Service: General;  Laterality: N/A;  . INCISION AND DRAINAGE PERIRECTAL ABSCESS N/A 12/02/2017   Procedure: IRRIGATION AND DEBRIDEMENT PERINEAL ABSCESS;  Surgeon: Greer Pickerel, MD;  Location: [facility];  Service: General;  Laterality: N/A;  . MENISCUS REPAIR Right 2013  . ROTATOR CUFF REPAIR Right 2005  . TONSILLECTOMY     as a child  . TOTAL ABDOMINAL HYSTERECTOMY  1988  . TOTAL KNEE ARTHROPLASTY Left   . WRIST RECONSTRUCTION Right 2008     Social History   Socioeconomic History  . Marital status: Married    Spouse name: Not on file  . Number of children: 2  . Years of education: Not on file  . Highest education level: Not on file  Occupational History  . Occupation: retired  Scientific laboratory technician  . Financial resource strain: Not on file  . Food insecurity    Worry: Not on file    Inability: Not on file  . Transportation needs    Medical: Not on file    Non-medical: Not on file  Tobacco Use  . Smoking status: Never Smoker  . Smokeless tobacco: Never Used  Substance and Sexual Activity  . Alcohol use: Yes    Alcohol/week: 7.0 standard drinks    Types: 7 Standard drinks or equivalent per week    Comment: wine daily  . Drug use:  No  . Sexual activity: Not on file  Lifestyle  . Physical activity    Days per week: Not on file    Minutes per session: Not on file  . Stress: Not on file  Relationships  . Social Herbalist on phone: Not on file    Gets together: Not on file    Attends religious service: Not on file    Active member of club or organization: Not on file    Attends meetings of clubs or organizations: Not on file    Relationship status: Not on file  . Intimate partner violence    Fear of current or ex partner: Not on file    Emotionally abused: Not on file    Physically abused: Not on file    Forced sexual activity: Not on file  Other Topics Concern  . Not on file  Social  History Narrative   Retired from Press [employer]   Lives at home with husband   Highest level of education:  Some college     Family History  Problem Relation Age of Onset  . Colon polyps Mother   . Diabetes Mother   . Heart disease Mother   . Kidney cancer Mother   . Arthritis Mother   . Hypertension Mother   . Hyperlipidemia Mother   . Colon polyps Son   . CAD Brother   . Dementia Father      Current Outpatient Medications on File Prior to Visit  Medication Sig Dispense Refill  . acetaminophen (TYLENOL) 500 MG tablet Take 500 mg by mouth as needed (pain).    Marland Kitchen amLODipine (NORVASC) 10 MG tablet Take 10 mg by mouth daily.     Marland Kitchen aspirin 81 MG tablet Take 81 mg by mouth daily.    . Cholecalciferol (VITAMIN D3) 10000 units TABS Take 1 tablet by mouth once a week.    . fexofenadine (ALLEGRA) 180 MG tablet Take 180 mg by mouth daily. 1/2 tab bid    . folic acid (FOLVITE) 1 MG tablet Take 2 mg by mouth daily.    . hydrochlorothiazide (HYDRODIURIL) 25 MG tablet Take 25 mg by mouth daily.    Marland Kitchen losartan (COZAAR) 100 MG tablet Take 100 mg by mouth daily.    Marland Kitchen MELATONIN PO Take 1 tablet by mouth at bedtime as needed.     . meloxicam (MOBIC) 15 MG tablet Take 15 mg by mouth daily.    . methotrexate (RHEUMATREX) 2.5 MG tablet Take 2.5 mg by mouth once a week. Caution:Chemotherapy. Protect from light.  Takes 8 tablets weekly    . Multiple Vitamin (MULTIVITAMIN) tablet Take 1 tablet by mouth daily.    . nitroGLYCERIN (NITROSTAT) 0.4 MG SL tablet Place 1 tablet (0.4 mg total) under the tongue every 5 (five) minutes as needed for chest pain. 30 tablet 3  . NON FORMULARY Place under the tongue at bedtime. CBD Oil- nightly    . Omega-3 Fatty Acids (FISH OIL) 1200 MG CAPS Take 1 capsule by mouth 2 (two) times daily.     Marland Kitchen omeprazole (PRILOSEC) 20 MG capsule Take 20 mg daily by mouth.    . polyethylene glycol (MIRALAX / GLYCOLAX) 17 g packet Take 17 g by mouth daily as needed. Taking three times weekly     . PSYLLIUM PO Take 1 packet by mouth daily. Fiber    . valACYclovir (VALTREX) 500 MG tablet Take 500 mg by mouth 2 (two) times daily as needed.  No current facility-administered medications on file prior to visit.     Cardiovascular studies:  ABI 05/19/2019: This exam reveals mildly decreased perfusion of the bilateral lower extremity, noted at the post tibial artery level (ABI 0.93). Mildly abnormal biphasic waveform at the level of the ankle.  Echocardiogram 05/19/2019: Normal LV systolic function with EF 56%. Left ventricle cavity is normal in size. Mild concentric hypertrophy of the left ventricle. Normal global wall motion. Normal diastolic filling pattern. Calculated EF 56%. Mild (Grade I) mitral regurgitation. Inadequate TR jet to estimate pulmonary artery systolic pressure. Normal right atrial pressure.   [place] Stress Test 04/27/2019: Stress EKG is non-diagnostic, as this is pharmacological stress test. Normal myocardial perfusion. Stress LV EF: 72%.  Low risk study.  EKG 04/09/2019: Sinus rhythm 77 bpm.  Poor R-wave progression. Otherwise normal EKG   Recent labs:  Results for TOM, RAGSDALE (MRN [id number]) as of 04/09/2019 10:48  Ref. Range 12/05/2017 06:28  COMPREHENSIVE METABOLIC PANEL Unknown Rpt (A)  Sodium Latest Ref Range: 135 - 145 mmol/L 139  Potassium Latest Ref Range: 3.5 - 5.1 mmol/L 3.9  Chloride Latest Ref Range: 101 - 111 mmol/L 105  CO2 Latest Ref Range: 22 - 32 mmol/L 24  Glucose Latest Ref Range: 65 - 99 mg/dL 97  BUN Latest Ref Range: 6 - 20 mg/dL 14  Creatinine Latest Ref Range: 0.44 - 1.00 mg/dL 0.82  Calcium Latest Ref Range: 8.9 - 10.3 mg/dL 9.0  Anion gap Latest Ref Range: 5 - 15  10  Phosphorus Latest Ref Range: 2.5 - 4.6 mg/dL 4.2  Magnesium Latest Ref Range: 1.7 - 2.4 mg/dL 2.0  Alkaline Phosphatase Latest Ref Range: 38 - 126 U/L 112  Albumin Latest Ref Range: 3.5 - 5.0 g/dL 2.9 (L)  AST Latest Ref Range: 15 - 41 U/L 162  (H)  ALT Latest Ref Range: 14 - 54 U/L 202 (H)  Total Protein Latest Ref Range: 6.5 - 8.1 g/dL 6.3 (L)  Total Bilirubin Latest Ref Range: 0.3 - 1.2 mg/dL 0.5  GFR, Est Non African American Latest Ref Range: >60 mL/min >60  GFR, Est African American Latest Ref Range: >60 mL/min >60    Results for KATARA, GRINER (MRN [id number]) as of 04/09/2019 10:48  Ref. Range 12/05/2017 06:28  WBC Latest Ref Range: 4.0 - 10.5 K/uL 7.0  RBC Latest Ref Range: 3.87 - 5.11 MIL/uL 3.29 (L)  Hemoglobin Latest Ref Range: 12.0 - 15.0 g/dL 10.8 (L)  HCT Latest Ref Range: 36.0 - 46.0 % 32.4 (L)  MCV Latest Ref Range: 78.0 - 100.0 fL 98.5  MCH Latest Ref Range: 26.0 - 34.0 pg 32.8  MCHC Latest Ref Range: 30.0 - 36.0 g/dL 33.3  RDW Latest Ref Range: 11.5 - 15.5 % 14.9  Platelets Latest Ref Range: 150 - 400 K/uL 199    ROS       There were no vitals filed for this visit.  Objective:   Physical Exam  Constitutional: She is oriented to person, place, and time. She appears well-developed and well-nourished. No distress.  Pulmonary/Chest: Effort normal.  Neurological: She is alert and oriented to person, place, and time.  Psychiatric: She has a normal mood and affect.  Nursing note and vitals reviewed.         Assessment & Recommendations:   68 year old Caucasian female with hypertension, hyperlipidemia, rheumatoid arthritis, osteoarthritis, anxiety depression, GERD, asthma, referred by Narda Amber, DO for evaluation of leg pain, exertional dyspnea.   Dyspnea on exertion, exertional chest  pain: Reassuring cardiac workup with echocardiogram and stress test. Her chest pain appears musculoskeletal in etiology. Shortness of breath is our of proportion to mild mitral regurgitation seen on echocardiogram. No indication for aspirin. In light of her regular use of NSAIDS, I have stopped her aspirin.  Claudication [facility]): Mildly abnormal ABI. Again, symptoms are out of proportion to these findings. Consider  alternate etiology.   Essential hypertension: Fairly well controlled.   I will see her on as needed basis.   Nigel Mormon, MD [facility] Pager: [phone] Office: [phone] If no answer Cell [phone]

## 2019-06-10 DIAGNOSIS — Z79899 Other long term (current) drug therapy: Secondary | ICD-10-CM | POA: Diagnosis not present

## 2019-06-10 DIAGNOSIS — M150 Primary generalized (osteo)arthritis: Secondary | ICD-10-CM | POA: Diagnosis not present

## 2019-06-10 DIAGNOSIS — M2550 Pain in unspecified joint: Secondary | ICD-10-CM | POA: Diagnosis not present

## 2019-06-10 DIAGNOSIS — M0579 Rheumatoid arthritis with rheumatoid factor of multiple sites without organ or systems involvement: Secondary | ICD-10-CM | POA: Diagnosis not present

## 2019-06-10 DIAGNOSIS — M79604 Pain in right leg: Secondary | ICD-10-CM | POA: Diagnosis not present

## 2019-06-10 DIAGNOSIS — M79605 Pain in left leg: Secondary | ICD-10-CM | POA: Diagnosis not present

## 2019-06-23 ENCOUNTER — Other Ambulatory Visit: Payer: Self-pay

## 2019-06-23 NOTE — Patient Outreach (Signed)
[facility]) Care Management  06/23/2019  Courtney Crane 1950/12/04 [id number]    Late entry  [place] closing the program.  Patient is transitioning to external program [facility] for continued case management.   Lazaro Arms RN, BSN, [place] Direct Dial:  [phone]  Fax: [phone]

## 2019-06-30 ENCOUNTER — Ambulatory Visit (INDEPENDENT_AMBULATORY_CARE_PROVIDER_SITE_OTHER): Payer: PPO | Admitting: Neurology

## 2019-06-30 ENCOUNTER — Other Ambulatory Visit: Payer: Self-pay

## 2019-06-30 ENCOUNTER — Encounter: Payer: Self-pay | Admitting: Neurology

## 2019-06-30 ENCOUNTER — Other Ambulatory Visit: Payer: Self-pay | Admitting: *Deleted

## 2019-06-30 VITALS — BP 155/74 | HR 75 | Temp 98.2°F | Ht 63.0 in | Wt 154.0 lb

## 2019-06-30 DIAGNOSIS — M79605 Pain in left leg: Secondary | ICD-10-CM

## 2019-06-30 DIAGNOSIS — M79604 Pain in right leg: Secondary | ICD-10-CM

## 2019-06-30 DIAGNOSIS — M5417 Radiculopathy, lumbosacral region: Secondary | ICD-10-CM

## 2019-06-30 NOTE — Procedures (Signed)
[facility]  [address]  [place], [place] [address] Tel: [phone] Fax:  [phone] Test Date:  06/30/2019  Patient: Courtney Crane DOB: 18-Jun-1951 Physician: Narda Amber, DO  Sex: Female Height: 5\' 3"  Ref Phys: Narda Amber, DO  ID#: [id number] Tenp: 33.0 Technician:    Patient Complaints: This is a 68 year old female referred for evaluation of bilateral leg pain.  NCV & EMG Findings: Extensive electrodiagnostic testing of the right lower extremity and additional studies of the left shows:  1. Bilateral sural and superficial peroneal sensory responses are within normal limits. 2. Bilateral peroneal and tibial motor responses are within normal limits. 3. Bilateral tibial H reflex studies are within normal limits. 4. Mild chronic motor axonal loss changes are seen involving bilateral gastrocnemius and biceps femoris short head muscles, without accompanied active denervation.  Impression: 1. Chronic S1 radiculopathy affecting bilateral lower extremities, mild in degree electrically. 2. There is no evidence of a large fiber sensorimotor polyneuropathy affecting the lower extremities.   ___________________________ Narda Amber, DO    Nerve Conduction Studies Anti Sensory Summary Table   Site NR Peak (ms) Norm Peak (ms) P-T Amp (V) Norm P-T Amp  Left Sup Peroneal Anti Sensory (Ant Lat Mall)  33C  12 cm    2.8 <4.6 8.3 >3  Right Sup Peroneal Anti Sensory (Ant Lat Mall)  33C  12 cm    2.5 <4.6 7.6 >3  Left Sural Anti Sensory (Lat Mall)  33C  Calf    3.1 <4.6 10.0 >3  Right Sural Anti Sensory (Lat Mall)  33C  Calf    3.0 <4.6 10.6 >3   Motor Summary Table   Site NR Onset (ms) Norm Onset (ms) O-P Amp (mV) Norm O-P Amp Site1 Site2 Delta-0 (ms) Dist (cm) Vel (m/s) Norm Vel (m/s)  Left Peroneal Motor (Ext Dig Brev)  33C  Ankle    4.8 <6.0 2.6 >2.5 B Fib Ankle 7.3 34.0 47 >40  B Fib    12.1  2.3  Poplt B Fib 1.7 9.0 53 >40  Poplt    13.8   2.0         Right Peroneal Motor (Ext Dig Brev)  33C  Ankle    3.6 <6.0 3.8 >2.5 B Fib Ankle 7.3 34.0 47 >40  B Fib    10.9  3.4  Poplt B Fib 1.5 8.0 53 >40  Poplt    12.4  3.2         Left Tibial Motor (Abd Hall Brev)  33C  Ankle    3.7 <6.0 12.8 >4 Knee Ankle 8.9 38.0 43 >40  Knee    12.6  10.1         Right Tibial Motor (Abd Hall Brev)  33C  Ankle    3.3 <6.0 12.6 >4 Knee Ankle 8.0 36.0 45 >40  Knee    11.3  8.7          H Reflex Studies   NR H-Lat (ms) Lat Norm (ms) L-R H-Lat (ms)  Left Tibial (Gastroc)  33C     32.93 <35 1.36  Right Tibial (Gastroc)  33C     34.29 <35 1.36   EMG   Side Muscle Ins Act Fibs Psw Fasc Number Recrt Dur Dur. Amp Amp. Poly Poly. Comment  Right AntTibialis Nml Nml Nml Nml Nml Nml Nml Nml Nml Nml Nml Nml N/A  Right BicepsFemS Nml Nml Nml Nml 1- Rapid Few 1+ Few 1+ Nml Nml  N/A  Right Gastroc Nml Nml Nml Nml 1- Rapid Few 1+ Few 1+ Nml Nml N/A  Right Flex Dig Long Nml Nml Nml Nml Nml Nml Nml Nml Nml Nml Nml Nml N/A  Right RectFemoris Nml Nml Nml Nml Nml Nml Nml Nml Nml Nml Nml Nml N/A  Right GluteusMed Nml Nml Nml Nml Nml Nml Nml Nml Nml Nml Nml Nml N/A  Left BicepsFemS Nml Nml Nml Nml 1- Rapid Few 1+ Few 1+ Nml Nml N/A  Left AntTibialis Nml Nml Nml Nml Nml Nml Nml Nml Nml Nml Nml Nml N/A  Left Gastroc Nml Nml Nml Nml 1- Rapid Few 1+ Few 1+ Nml Nml N/A  Left Flex Dig Long Nml Nml Nml Nml Nml Nml Nml Nml Nml Nml Nml Nml N/A  Left RectFemoris Nml Nml Nml Nml Nml Nml Nml Nml Nml Nml Nml Nml N/A  Left GluteusMed Nml Nml Nml Nml Nml Nml Nml Nml Nml Nml Nml Nml N/A      Waveforms:

## 2019-06-30 NOTE — Progress Notes (Signed)
Follow-up Visit   Date: 06/30/19   Courtney Crane MRN: [id number] DOB: 04-07-1951   Interim History: Courtney Crane is a 68 y.o. right-handed Caucasian female with rheumatoid arthritis, osteoarthritis, hypertension, hyperlipidemia, anxiety/depression, GERD, and asthma right-handed Caucasian female with rheumatoid arthritis, osteoarthritis, hypertension, hyperlipidemia, anxiety/depression, GERD, and asthma returning to the clinic for follow-up of bilateral leg pain.  The patient was accompanied to the clinic by self.  History of present illness: In March 2020, she was on vacation in [place] and was able to walk/hike at the [facility] without any leg pain or shortness of breath.  When she arrived home, she began noticing that she was unable to walk as long.  Further, she began getting increasingly exhausted, lack of energy, short of breath especially with exertion.  She previously could walk several miles and now struggles to walk a mile because of leg pain.  Pain is described as has achy/sore pain in her thigh and calf which is severe at times, such that she is unable to take another step.  If she rests, her leg pain very quickly start to resolve. Within a minute, she is feeling well enough to resume walking. Right leg is worse.  There is no numbness/tingling in the legs or associated back pain.  She does not have tenderness of the muscles and feels that her pain is deep.  Recent lab testing includes essentially normal inflammatory markers (mildly elevated ESR 25), CK, vitamin B12, folate, TSH, and CoV2 antibody.  She has not had recent cardiac stress testing or ABI.   She was seen by Dr. Gavin Pound on 03/24/2019 for RA and received DepoMedrol injection due to joint pain.  She takes MTX for RA.  She did not appreciate any improvement of leg pain.   She denies ptosis, double vision, difficulty swallowing/talking, or limb weakness.   UPDATE 06/30/2019:  At her last visit, I  recommended that she see cardiology for evaluation of exertional dyspnea and leg pain.  Her cardiac work-up returned normal, with only mild abnormal in ABI at the great toe.  She reports having worsening bilateral leg pain, described as sharp and involves the thighs and into her calf.  Pain is now present at rest and can wake her up from sleeping. She has some muscle tenderness in the thighs.  She does not have leg weakness or low back pain. She has intermittent numbness/tingling of the toes. Tylenol can provide some relief. She has become more sedentary because she is unable to go to the gym due the pandemic.  Medications:  Current Outpatient Medications on File Prior to Visit  Medication Sig Dispense Refill  . acetaminophen (TYLENOL) 500 MG tablet Take 500 mg by mouth as needed (pain).    Marland Kitchen amLODipine (NORVASC) 10 MG tablet Take 10 mg by mouth daily.     Marland Kitchen aspirin 81 MG chewable tablet Baby Aspirin 81 mg chewable tablet   1 tablet every day by oral route.    . Cholecalciferol (VITAMIN D3) 10000 units TABS Take 1 tablet by mouth once a week.    . Coral Calcium 1000 (390 Ca) MG TABS Take by mouth.    . fexofenadine (ALLEGRA) 180 MG tablet Take 90 mg by mouth 2 (two) times a day.     . folic acid (FOLVITE) 1 MG tablet Take 2 mg by mouth daily.    . hydrochlorothiazide (HYDRODIURIL) 25 MG tablet Take 25 mg by mouth daily.    Marland Kitchen losartan (COZAAR) 100 MG tablet  Take 100 mg by mouth daily.    Marland Kitchen MELATONIN PO Take 1 tablet by mouth at bedtime as needed.     . meloxicam (MOBIC) 15 MG tablet Take 15 mg by mouth daily.    . methotrexate (RHEUMATREX) 2.5 MG tablet Take 20 mg by mouth once a week. Caution:Chemotherapy. Protect from light.  Takes 8 tablets weekly     . Multiple Vitamin (MULTIVITAMIN) tablet Take 1 tablet by mouth daily.    . NON FORMULARY Place under the tongue at bedtime. CBD Oil- nightly    . nystatin-triamcinolone (MYCOLOG II) cream nystatin-triamcinolone 100,000 unit/g-0.1 % topical  cream  APPLY TO AFFECTED AREA TWICE A DAY IN THE MORNING AND IN THE EVENING    . Omega-3 Fatty Acids (FISH OIL) 1200 MG CAPS Take 1 capsule by mouth 2 (two) times daily.     Marland Kitchen omeprazole (PRILOSEC) 20 MG capsule Take 20 mg daily by mouth.    . polyethylene glycol (MIRALAX / GLYCOLAX) 17 g packet Take 17 g by mouth daily as needed. Taking three times weekly    . PSYLLIUM PO Take 1 packet by mouth daily. Fiber    . valACYclovir (VALTREX) 500 MG tablet Take 500 mg by mouth 2 (two) times daily as needed.    . nitroGLYCERIN (NITROSTAT) 0.4 MG SL tablet Place 1 tablet (0.4 mg total) under the tongue every 5 (five) minutes as needed for chest pain. 30 tablet 3   No current facility-administered medications on file prior to visit.     Allergies:  Allergies  Allergen Reactions  . Demerol [Meperidine] Nausea Only  . Nickel   . Tape Rash    Review of Systems:  CONSTITUTIONAL: No fevers, chills, night sweats, or weight loss.  EYES: No visual changes or eye pain ENT: No hearing changes.  No history of nose bleeds.   RESPIRATORY: No cough, wheezing and shortness of breath.   CARDIOVASCULAR: Negative for chest pain, and palpitations.   GI: Negative for abdominal discomfort, blood in stools or black stools.  No recent change in bowel habits.   GU:  No history of incontinence.   MUSCLOSKELETAL: +history of joint pain or swelling.  +myalgias.   SKIN: Negative for lesions, rash, and itching.   ENDOCRINE: Negative for cold or heat intolerance, polydipsia or goiter.   PSYCH:  No depression or anxiety symptoms.   NEURO: As Above.   Vital Signs:  BP (!) 155/74   Pulse 75   Temp 98.2 F (36.8 C)   Ht '5\' 3"'  (1.6 m)   Wt 154 lb (69.9 kg)   SpO2 99%   BMI 27.28 kg/m    General Medical Exam:   General:  Well appearing, comfortable  Eyes/ENT: see cranial nerve examination.   Neck:  No carotid bruits. Respiratory:  Clear to auscultation, good air entry bilaterally.   Cardiac:  Regular rate and  rhythm, no murmur.   Ext:  No edema   Neurological Exam: MENTAL STATUS including orientation to time, place, person, recent and remote memory, attention span and concentration, language, and fund of knowledge is normal.  Speech is not dysarthric.  CRANIAL NERVES:   Face is symmetric.  MOTOR:  Motor strength is 5/5 in all extremities, including distally in the feet.  No atrophy, fasciculations or abnormal movements.  No pronator drift.  Tone is normal.    MSRs:  Reflexes are 1+/4 throughout  SENSORY:  Intact to vibration, temperature, and pin prick throughout.  COORDINATION/GAIT: Gait narrow based and  stable.   Data: Labs 03/26/2023 9 SARS-CoV-2 antibody negative, ESR 25, free T4 0.9, vitamin B12 1011, folate >20, CK 104, C-reactive protein < 1 Labs 01/30/2018: 5.3 Labs 03/18/2019: TSH 3.29, creatinine 0.9, sodium 140, potassium 4.3, chloride 104, LFTs normal  NCS/EMG of the legs 06/30/2019:   1. Chronic S1 radiculopathy affecting bilateral lower extremities, mild in degree electrically. 2. There is no evidence of a large fiber sensorimotor polyneuropathy affecting the lower extremities.  IMPRESSION/PLAN: Bilateral leg pain, possibly secondary to S1 radiculopathy, however EMG findings are quite mild and out of proportion to her symptoms.  I have recommended that she start out-patient physical therapy for low back strengthening and see if that helps. She was reassured that there is no evidence of neuropathy.  If no improvement, the next step would be to get imaging of her lumbar spine.  I will see her back in 3 months   Thank you for allowing me to participate in patient's care.  If I can answer any additional questions, I would be pleased to do so.    Sincerely,    Colisha Redler K. Posey Pronto, DO

## 2019-06-30 NOTE — Patient Instructions (Addendum)
Referral for out-patient physical therapy for low back strengthening   Follow up in 3 months virtual visit

## 2019-07-03 ENCOUNTER — Encounter (HOSPITAL_COMMUNITY): Payer: Self-pay

## 2019-07-03 ENCOUNTER — Ambulatory Visit (HOSPITAL_COMMUNITY): Payer: PPO | Attending: Neurology

## 2019-07-03 ENCOUNTER — Other Ambulatory Visit: Payer: Self-pay

## 2019-07-03 DIAGNOSIS — G8929 Other chronic pain: Secondary | ICD-10-CM | POA: Insufficient documentation

## 2019-07-03 DIAGNOSIS — M5441 Lumbago with sciatica, right side: Secondary | ICD-10-CM | POA: Diagnosis not present

## 2019-07-03 DIAGNOSIS — M79604 Pain in right leg: Secondary | ICD-10-CM | POA: Diagnosis not present

## 2019-07-03 DIAGNOSIS — M79605 Pain in left leg: Secondary | ICD-10-CM | POA: Diagnosis not present

## 2019-07-03 DIAGNOSIS — M5442 Lumbago with sciatica, left side: Secondary | ICD-10-CM | POA: Insufficient documentation

## 2019-07-03 NOTE — Therapy (Signed)
[place], [place], [address] Phone: [phone]   Fax:  [phone]  Physical Therapy Evaluation  Patient Details  Name: Courtney Crane MRN: [id number] Date of Birth: [birth year] Referring Provider (PT): Alda Berthold, DO   Encounter Date: 07/03/2019  PT End of Session - 07/03/19 1423    Visit Number  1    Number of Visits  12    Date for PT Re-Evaluation  08/14/19    Authorization Type  [employer] (no limit, no auth, $15 co-pay)    Authorization Time Period  07/03/19-08/14/19    PT Start Time  1415    PT Stop Time  1505    PT Time Calculation (min)  50 min    Activity Tolerance  Patient tolerated treatment well    Behavior During Therapy  [facility] for tasks assessed/performed       Past Medical History:  Diagnosis Date  . Anemia   . Anxiety   . Arthritis   . Asthma   . Colon polyps   . Complication of anesthesia    "sometimes I dont like to wake up after " ie difficult to awaken   . Depression   . Diverticulitis   . Diverticulosis   . GERD (gastroesophageal reflux disease)   . HLD (hyperlipidemia)   . HTN (hypertension)   . IBS (irritable bowel syndrome)   . Insomnia   . MRSA infection   . Osteoarthritis   . PONV (postoperative nausea and vomiting)   . RA (rheumatoid arthritis) ([place])   . Rheumatoid arthritis ([place])   . Seasonal allergies   . Shingles   . Status post dilation of esophageal narrowing   . Vitamin D deficiency     Past Surgical History:  Procedure Laterality Date  . APPENDECTOMY  1975  . bladder tack  1990  . CHOLECYSTECTOMY N/A 01/31/2017   Procedure: LAPAROSCOPIC CHOLECYSTECTOMY WITH INTRAOPERATIVE CHOLANGIOGRAM;  Surgeon: Armandina Gemma, MD;  Location: [facility];  Service: General;  Laterality: N/A;  . INCISION AND DRAINAGE PERIRECTAL ABSCESS N/A 12/02/2017   Procedure: IRRIGATION AND DEBRIDEMENT PERINEAL ABSCESS;  Surgeon: Greer Pickerel, MD;  Location: [place];  Service:  General;  Laterality: N/A;  . MENISCUS REPAIR Right 2013  . ROTATOR CUFF REPAIR Right 2005  . TONSILLECTOMY     as a child  . TOTAL ABDOMINAL HYSTERECTOMY  1988  . TOTAL KNEE ARTHROPLASTY Left   . WRIST RECONSTRUCTION Right 2008    There were no vitals filed for this visit.   Subjective Assessment - 07/03/19 1425    Subjective  Patient reports prior to [place] she was fairly active and participated in silver sneakers at the [facility] and used to walk in her neighborhood for exercise. She continued to walk in neighborhood with her husband and typically walked 1-3 miles about 3-5x/week. She states she went to the [facility] in March and walked a lot while they were there. She reports after returning at the end of March she began to notice she was having more difficulty walking. She had SOB, and posterior leg pain bilaterally with walking just half a mile. Around that time she saw her PCP and they referred her to neuro and cardiology where everything came back normal. She did have a NCV test and believe the neurologist stated there might be some nerve involvement from a disc pathology. Patient reports her MD for RA believes one possibility is that she has some minor compression in the  back affecting her nerve and this is causing leg pain. The patient feels this is the most likely cause and believes she will improve with strengthening and physical therapy.    Pertinent History  RA, HTN    Limitations  Walking    Patient Stated Goals  to get back to walking more    Currently in Pain?  Yes    Pain Score  6     Pain Location  Leg    Pain Orientation  Posterior;Left;Right;Upper    Pain Descriptors / Indicators  Burning    Pain Type  Acute pain   subacute   Pain Radiating Towards  it feels sharp burning in the back of her thighs and occasionally goes down to her calf    Pain Onset  More than a month ago    Pain Frequency  Intermittent    Aggravating Factors   walking, transitional movement, sitting on  hard surfaces    Pain Relieving Factors  unsure    Effect of Pain on Daily Activities  moderate, it has limited her from exercising         [facility] PT Assessment - 07/03/19 0001      Assessment   Medical Diagnosis  Bilateral Leg Pain    Referring Provider (PT)  Narda Amber K, DO    Onset Date/Surgical Date  02/18/19   approximate   Prior Therapy  yes for knees and wrist      Precautions   Precautions  None      Restrictions   Weight Bearing Restrictions  No      Balance Screen   Has the patient fallen in the past 6 months  No    Has the patient had a decrease in activity level because of a fear of falling?   No    Is the patient reluctant to leave their home because of a fear of falling?   No      Home Environment   Living Environment  Private residence    Living Arrangements  Spouse/significant other    Available Help at Discharge  Family    Type of [place] to enter    Entrance Stairs-Number of Steps  3 in from the garage, 2 at the back, and more steps at the front    Entrance Stairs-Rails  Can reach both    [place]  One level    [place] - 2 wheels;Cane - single point;Bedside commode;Shower seat    Additional Comments  no rails on steps at back of house      Prior Function   Level of [place]  Retired    [employer]  --    Leisure  pt loves to read, loves to travel to [place] and spend time with family, pt also [employer], [place]      Cognition   Overall Cognitive Status  Within Functional Limits for tasks assessed      Posture/Postural Control   Posture/Postural Control  Postural limitations    Postural Limitations  Rounded Shoulders;Forward head      ROM / Strength   AROM / PROM / Strength  AROM;Strength      AROM   Overall AROM Comments  Quadrants: Rt Flex quad = Rt post thigh pain; Lt Flex Quad = Lt post thigh pain    AROM Assessment Site  Lumbar  Lumbar Flexion   60    Lumbar Extension  20    Lumbar - Right Side Bend  25    Lumbar - Left Side Bend  20      Strength   Strength Assessment Site  Hip;Knee;Ankle    Right Hip Flexion  5/5    Right Hip Extension  5/5    Right Hip ABduction  4+/5    Left Hip Flexion  5/5    Left Hip Extension  4+/5    Left Hip ABduction  4/5    Right/Left Knee  Right;Left    Right Knee Flexion  5/5    Right Knee Extension  5/5    Left Knee Flexion  5/5    Left Knee Extension  5/5    Right/Left Ankle  Right;Left    Right Ankle Dorsiflexion  5/5    Left Ankle Dorsiflexion  5/5      Palpation   Spinal mobility  Pt reports symptom provocation in Lt posterior thigh with PA to L4-S1. Greatest provocation is with pressure to L5 and S1 and it lessens superior to this region.     Palpation comment  Patient with severe tenderness to palpation along Lt lateral biceps femoris at myotendinous junction. This may also be tenderness to sciatic nerve tract. Pt with tenderness alogn Rt lateral biceps femoris closer to origin at ischial tuberosity and less severe than Lt.      Special Tests    Special Tests  Lumbar    Lumbar Tests  Straight Leg Raise      Straight Leg Raise   Findings  Positive    Side   Left   Bilaterally   Comment  (see comments for test position, 1 pillow and legs straight) Pt positive bilaterally with pain in poster thigh at ~30*. Patient with increased pain with ankle dorsiflexion and cervical flexion, decreased pain with plantar flexion and cervical extension.   legs ext, feet 7" apart, test LE hand on belly, other on top     Ambulation/Gait   Ambulation/Gait  Yes    Ambulation/Gait Assistance  7: Independent    Ambulation Distance (Feet)  400 Feet   2MWT   Assistive device  None    Gait Pattern  Step-through pattern;Decreased stance time - left;Decreased stride length;Decreased hip/knee flexion - left;Antalgic    Ambulation Surface  Level;Indoor    Gait velocity  1.01 m/s        Objective  measurements completed on examination: See above findings.     PT Education - 07/03/19 1657    Education Details  Educated on exam findings and presentation. Educated on appropriate POC and interventions she may benefit from.    Person(s) Educated  Patient    Methods  Explanation    Comprehension  Verbalized understanding       PT Short Term Goals - 07/03/19 1658      PT SHORT TERM GOAL #1   Title  Patient will be independent with HEP, updated PRN, to improve neurodynamic mobility and flexibility to reduce leg pain.    Time  3    Period  Weeks    Status  New    Target Date  07/24/19      PT SHORT TERM GOAL #2   Title  Patient will have negative SLR test bil LE to indicate improvement in neurodynamic mobility.    Time  3    Period  Weeks    Status  New  PT SHORT TERM GOAL #3   Title  Patient will perform 2 MWT with no increase in LE pain or deviations from normalized gait.    Time  3    Period  Weeks    Status  New        PT Long Term Goals - 07/03/19 1700      PT LONG TERM GOAL #1   Title  Patient will have negative slump test bil to indicate further improvement in neurodynamic tension (test this once SLR is negative)    Time  6    Period  Weeks    Status  New    Target Date  08/14/19      PT LONG TERM GOAL #2   Title  Patient will improve all LE muscle groups to 5/5 to indicate significant improvement in weak hip musculature.    Time  6    Period  Weeks    Status  New      PT LONG TERM GOAL #3   Title  Patient will complete 6 minute walk test with no increase in LE pain and no deviations from normal gait to indicate improved tolerance to walking.    Time  6    Period  Weeks    Status  New          Plan - 07/03/19 1651    Clinical Impression Statement  Courtney Crane presents to physical therapy for evaluation of bil posterior leg pain. She reports it occasionally travels down to her calves and sometimes to her ant thigh but typically is in her posterior  thigh and is a sharp, burning sensation. Her pain is aggravated by walking for > 1 minutes and by sitting on hard surfaces. She reports her pain does typically decrease within about 20 minutes of it being aggravated. Objective testing reveals increase neurodynamic tension with SLR in bil LE's and some weakness of the Lt hip. She also has severe tenderness to palpation along Lt lateral hamstring causing her to jump. She has some tenderness along Rt lateral hamstring as well but it is not as severe. PA's to L5-S1 provoked posterior Lt thigh pain as well. Courtney Crane signs/symptoms present as possible sciatica or hamstring tendinopathy. As she did not have pain with resistance testing of her hamstring it is more likely she is having radicular symptoms of the sciatic nerve due to irritation in the lumbar spine and secondary irritation at the myotendinous junction in her hamstring. She will benefit from skilled PT interventions to address impairments and progress towards goals to improve QOL.    Personal Factors and Comorbidities  Age;Comorbidity 2    Comorbidities  HTN, RA    Examination-Activity Limitations  Locomotion Level    Examination-Participation Restrictions  Community Activity    Stability/Clinical Decision Making  Evolving/Moderate complexity    Clinical Decision Making  Low    Rehab Potential  Good    PT Frequency  2x / week    PT Duration  6 weeks    PT Treatment/Interventions  ADLs/Self Care Home Management;Aquatic Therapy;Electrical Stimulation;Cryotherapy;Iontophoresis 4mg /ml Dexamethasone;Moist Heat;Functional mobility training;DME Instruction;Gait training;Stair training;Therapeutic exercise;Therapeutic activities;Neuromuscular re-education;Balance training;Patient/family education;Manual techniques;Passive range of motion;Dry needling;Taping;Spinal Manipulations;Joint Manipulations    PT Next Visit Plan  Review goals with patient. Initiate manual therapy for lumbar spine mobilization and  hamstring STM along sciatic nerve tract. Begin segmental nerve glides. Provide hip abd and ext strengthening.    PT Home Exercise Plan  provide next session: segmental sciatic nerve  glide and gluteus medius exercise    Consulted and Agree with Plan of Care  Patient       Patient will benefit from skilled therapeutic intervention in order to improve the following deficits and impairments:  Abnormal gait, Decreased balance, Decreased mobility, Difficulty walking, Decreased activity tolerance, Pain, Impaired flexibility, Increased fascial restricitons  Visit Diagnosis: 1. Pain in left leg   2. Pain in right leg   3. Chronic midline low back pain with bilateral sciatica        Problem List Patient Active Problem List   Diagnosis Date Noted  . DOE (dyspnea on exertion) 04/09/2019  . Exertional chest pain 04/09/2019  . Claudication ([place]) 04/09/2019  . Leukocytosis 12/04/2017  . AKI (acute kidney injury) ([place]) 11/29/2017  . Rheumatoid arthritis ([place]) 11/29/2017  . Cellulitis and abscess of buttock 11/28/2017  . 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 History of shingles 06/06/2016  . Genital herpes simplex 06/06/2016  . Seasonal allergies 06/06/2016  . Seborrheic dermatitis   . Prediabetes   . Anemia   . GERD (gastroesophageal reflux disease)   . Vitamin D deficiency   . Insomnia, unspecified   . Essential hypertension     Kipp Brood, PT, DPT, [facility] Physical Therapist with [facility]  07/03/2019 5:24 PM    [place], [place], [address] Phone: [phone]   Fax:  [phone]  Name: Jaida Basurto MRN: [id number] Date of Birth: 01/22/1951

## 2019-07-07 ENCOUNTER — Encounter (HOSPITAL_COMMUNITY): Payer: Self-pay

## 2019-07-07 ENCOUNTER — Ambulatory Visit (HOSPITAL_COMMUNITY): Payer: PPO

## 2019-07-07 ENCOUNTER — Other Ambulatory Visit: Payer: Self-pay

## 2019-07-07 DIAGNOSIS — M79605 Pain in left leg: Secondary | ICD-10-CM | POA: Diagnosis not present

## 2019-07-07 DIAGNOSIS — M79604 Pain in right leg: Secondary | ICD-10-CM

## 2019-07-07 DIAGNOSIS — G8929 Other chronic pain: Secondary | ICD-10-CM

## 2019-07-07 NOTE — Therapy (Signed)
[place], [place], [address] Phone: [phone]   Fax:  [phone]  Physical Therapy Treatment  Patient Details  Name: Courtney Crane MRN: [id number] Date of Birth: 1951-08-25 Referring Provider (PT): Alda Berthold, DO   Encounter Date: 07/07/2019  PT End of Session - 07/07/19 1550    Visit Number  2    Number of Visits  12    Date for PT Re-Evaluation  08/14/19    Authorization Type  [employer] (no limit, no auth, $15 co-pay)    Authorization Time Period  07/03/19-08/14/19    PT Start Time  1546    PT Stop Time  1626    PT Time Calculation (min)  40 min    Activity Tolerance  Patient tolerated treatment well    Behavior During Therapy  [facility] for tasks assessed/performed       Past Medical History:  Diagnosis Date  . Anemia   . Anxiety   . Arthritis   . Asthma   . Colon polyps   . Complication of anesthesia    "sometimes I dont like to wake up after " ie difficult to awaken   . Depression   . Diverticulitis   . Diverticulosis   . GERD (gastroesophageal reflux disease)   . HLD (hyperlipidemia)   . HTN (hypertension)   . IBS (irritable bowel syndrome)   . Insomnia   . MRSA infection   . Osteoarthritis   . PONV (postoperative nausea and vomiting)   . RA (rheumatoid arthritis) ([place])   . Rheumatoid arthritis ([place])   . Seasonal allergies   . Shingles   . Status post dilation of esophageal narrowing   . Vitamin D deficiency     Past Surgical History:  Procedure Laterality Date  . APPENDECTOMY  1975  . bladder tack  1990  . CHOLECYSTECTOMY N/A 01/31/2017   Procedure: LAPAROSCOPIC CHOLECYSTECTOMY WITH INTRAOPERATIVE CHOLANGIOGRAM;  Surgeon: Armandina Gemma, MD;  [facility];  Service: General;  Laterality: N/A;  . INCISION AND DRAINAGE PERIRECTAL ABSCESS N/A 12/02/2017   Procedure: IRRIGATION AND DEBRIDEMENT PERINEAL ABSCESS;  Surgeon: Greer Pickerel, MD;  Location: [place];  Service:  General;  Laterality: N/A;  . MENISCUS REPAIR Right 2013  . ROTATOR CUFF REPAIR Right 2005  . TONSILLECTOMY     as a child  . TOTAL ABDOMINAL HYSTERECTOMY  1988  . TOTAL KNEE ARTHROPLASTY Left   . WRIST RECONSTRUCTION Right 2008    There were no vitals filed for this visit.  Subjective Assessment - 07/07/19 1543    Subjective  Pt reports increased soreness/pain on Saturday following PT session.  Reports current pain scale 4/10 LBP and 5/10 Lt hamstring pain.    Pertinent History  RA, HTN    Patient Stated Goals  to get back to walking more    Currently in Pain?  Yes    Pain Score  4     Pain Location  Back    Pain Orientation  Lower    Pain Descriptors / Indicators  Aching;Sore    Pain Type  Acute pain   subacute   Pain Radiating Towards  it feels sharp burning in the back of her thighs and occasionally goes down to her calf    Pain Onset  More than a month ago    Aggravating Factors   walking, transitional movement, sitting on hard surfaces    Pain Relieving Factors  unsure    Effect of  Pain on Daily Activities  moderate, it has limited her from exerciseing    Multiple Pain Sites  Yes    Pain Score  5    Pain Location  Leg    Pain Orientation  Left;Proximal    Pain Descriptors / Indicators  Aching    Pain Type  Acute pain   subacute   Pain Radiating Towards  it feels sharp burning in the back of her thighs and occasionally goes down to her calf                       [facility] Adult PT Treatment/Exercise - 07/07/19 0001      Exercises   Exercises  Lumbar      Lumbar Exercises: Supine   Bridge  10 reps    Bridge Limitations  Lt hamstring irritated with exercise    Other Supine Lumbar Exercises  Sciatic nerve glide      Lumbar Exercises: Sidelying   Clam  10 reps;Both;3 seconds      Manual Therapy   Manual Therapy  Soft tissue mobilization    Manual therapy comments  Manual complete separate than rest of tx    Soft tissue mobilization  STM to lumbar  paraspinals Rt>Lt, gluteal and medial hamstring in prone             PT Education - 07/07/19 1555    Education Details  Reviewed goals, educated importance/benefits of compliance wiht HEP    Person(s) Educated  Patient    Methods  Explanation    Comprehension  Verbalized understanding       PT Short Term Goals - 07/03/19 1658      PT SHORT TERM GOAL #1   Title  Patient will be independent with HEP, updated PRN, to improve neurodynamic mobility and flexibility to reduce leg pain.    Time  3    Period  Weeks    Status  New    Target Date  07/24/19      PT SHORT TERM GOAL #2   Title  Patient will have negative SLR test bil LE to indicate improvement in neurodynamic mobility.    Time  3    Period  Weeks    Status  New      PT SHORT TERM GOAL #3   Title  Patient will perform 2 MWT with no increase in LE pain or deviations from normalized gait.    Time  3    Period  Weeks    Status  New        PT Long Term Goals - 07/03/19 1700      PT LONG TERM GOAL #1   Title  Patient will have negative slump test bil to indicate further improvement in neurodynamic tension (test this once SLR is negative)    Time  6    Period  Weeks    Status  New    Target Date  08/14/19      PT LONG TERM GOAL #2   Title  Patient will improve all LE muscle groups to 5/5 to indicate significant improvement in weak hip musculature.    Time  6    Period  Weeks    Status  New      PT LONG TERM GOAL #3   Title  Patient will complete 6 minute walk test with no increase in LE pain and no deviations from normal gait to indicate improved tolerance to walking.  Time  6    Period  Weeks    Status  New            Plan - 07/07/19 1642    Clinical Impression Statement  Reviewed goals and educated benefits of compliance with HEP.  Sessoin focus on glut med strengthening, nerve glides to address radicular symptoms and manual techniuqes to address restricitons for pain control.   Pt educated on  sciatic nerve origin/insertion and musculature surrounding nerve.  HEP included supine sciatic nerve glide and clam exercises  Pt able to complete all exercises correctly wiht good form and minimal cueing.  Pt given printout as well as email with live demonstration to assure good carryover at home wiht HEP.  EOS with manual soft tissue mobilizatin to address lumbar paraspinals, gluteal muscualture and hamstrings.    Personal Factors and Comorbidities  Age;Comorbidity 2    Comorbidities  HTN, RA    Examination-Activity Limitations  Locomotion Level    Examination-Participation Restrictions  Community Activity    Stability/Clinical Decision Making  Evolving/Moderate complexity    Clinical Decision Making  Low    Rehab Potential  Good    PT Frequency  2x / week    PT Duration  6 weeks    PT Treatment/Interventions  ADLs/Self Care Home Management;Aquatic Therapy;Electrical Stimulation;Cryotherapy;Iontophoresis 4mg /ml Dexamethasone;Moist Heat;Functional mobility training;DME Instruction;Gait training;Stair training;Therapeutic exercise;Therapeutic activities;Neuromuscular re-education;Balance training;Patient/family education;Manual techniques;Passive range of motion;Dry needling;Taping;Spinal Manipulations;Joint Manipulations    PT Next Visit Plan  Review compliance with HEP.  Initiate manual therapy for lumbar spine mobilization and hamstring STM along sciatic nerve tract. Begin segmental nerve glides. Provide hip abd and ext strengthening.  Add RTB wiht clam as appropriate.    PT Home Exercise Plan  07/07/19: segmental sciatic nerve glide and sidelying clam.    Consulted and Agree with Plan of Care  Patient       Patient will benefit from skilled therapeutic intervention in order to improve the following deficits and impairments:  Abnormal gait, Decreased balance, Decreased mobility, Difficulty walking, Decreased activity tolerance, Pain, Impaired flexibility, Increased fascial restricitons  Visit  Diagnosis: 1. Pain in left leg   2. Pain in right leg   3. Chronic midline low back pain with bilateral sciatica        Problem List Patient Active Problem List   Diagnosis Date Noted  . DOE (dyspnea on exertion) 04/09/2019  . Exertional chest pain 04/09/2019  . Claudication ([place]) 04/09/2019  . Leukocytosis 12/04/2017  . AKI (acute kidney injury) ([place]) 11/29/2017  . Rheumatoid arthritis ([place]) 11/29/2017  . Cellulitis and abscess of buttock 11/28/2017  . 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 History of shingles 06/06/2016  . Genital herpes simplex 06/06/2016  . Seasonal allergies 06/06/2016  . Seborrheic dermatitis   . Prediabetes   . Anemia   . GERD (gastroesophageal reflux disease)   . Vitamin D deficiency   . Insomnia, unspecified   . Essential hypertension    Ihor Austin, [place]; [place]  Aldona Lento 07/07/2019, 4:54 PM  [place] [address] [place], [place], [address] Phone: [phone]   Fax:  [phone]  Name: Courtney Crane MRN: [id number] Date of Birth: May 05, 1951

## 2019-07-07 NOTE — Patient Instructions (Signed)
Access Code: [email or web address]  URL: [email or web address]  Date: 07/07/2019  Prepared by: Ihor Austin   Exercises Supine Sciatic Nerve Glide - 10 reps - 3 sets - 1x daily - 7x weekly Clamshell - 10 reps - 2 sets - 5" hold - 1x daily - 4x weekly  Setup Begin lying on your back with your knees bent and feet flat on the floor. Grasp one leg behind your thigh and straighten that knee. Movement Bend your foot down toward your body, then away toward the ceiling, keeping your leg straight. Repeat this movement. Tip Make sure to keep your low back flat on the floor during the exercise.      Abduction: Clam (Eccentric) - Side-Lying    Lie on side with knees bent. Lift top knee, keeping feet together. Keep trunk steady.  Slowly lower for 3-5 seconds. 10 reps per set, 1-2 sets per day, 4 days per week. Add red theraband   http://ecce.exer.us/65   Copyright  VHI. All rights reserved.

## 2019-07-08 ENCOUNTER — Encounter (HOSPITAL_COMMUNITY): Payer: Self-pay

## 2019-07-08 ENCOUNTER — Ambulatory Visit (HOSPITAL_COMMUNITY): Payer: PPO

## 2019-07-08 DIAGNOSIS — G8929 Other chronic pain: Secondary | ICD-10-CM

## 2019-07-08 DIAGNOSIS — M79605 Pain in left leg: Secondary | ICD-10-CM

## 2019-07-08 DIAGNOSIS — M79604 Pain in right leg: Secondary | ICD-10-CM

## 2019-07-08 DIAGNOSIS — M5441 Lumbago with sciatica, right side: Secondary | ICD-10-CM

## 2019-07-08 NOTE — Therapy (Signed)
[place], [place], [address] Phone: [phone]   Fax:  [phone]  Physical Therapy Treatment  Patient Details  Name: Courtney Crane MRN: [id number] Date of Birth: 1951/06/12 Referring Provider (PT): Alda Berthold, DO   Encounter Date: 07/08/2019  PT End of Session - 07/08/19 1123    Visit Number  3    Number of Visits  12    Date for PT Re-Evaluation  08/14/19    Authorization Type  [employer] (no limit, no auth, $15 co-pay)    Authorization Time Period  07/03/19-08/14/19    PT Start Time  1116    PT Stop Time  1205    PT Time Calculation (min)  49 min    Activity Tolerance  Patient tolerated treatment well    Behavior During Therapy  [facility] for tasks assessed/performed       Past Medical History:  Diagnosis Date  . Anemia   . Anxiety   . Arthritis   . Asthma   . Colon polyps   . Complication of anesthesia    "sometimes I dont like to wake up after " ie difficult to awaken   . Depression   . Diverticulitis   . Diverticulosis   . GERD (gastroesophageal reflux disease)   . HLD (hyperlipidemia)   . HTN (hypertension)   . IBS (irritable bowel syndrome)   . Insomnia   . MRSA infection   . Osteoarthritis   . PONV (postoperative nausea and vomiting)   . RA (rheumatoid arthritis) ([place])   . Rheumatoid arthritis ([place])   . Seasonal allergies   . Shingles   . Status post dilation of esophageal narrowing   . Vitamin D deficiency     Past Surgical History:  Procedure Laterality Date  . APPENDECTOMY  1975  . bladder tack  1990  . CHOLECYSTECTOMY N/A 01/31/2017   Procedure: LAPAROSCOPIC CHOLECYSTECTOMY WITH INTRAOPERATIVE CHOLANGIOGRAM;  Surgeon: Armandina Gemma, MD;  Location: [facility];  Service: General;  Laterality: N/A;  . INCISION AND DRAINAGE PERIRECTAL ABSCESS N/A 12/02/2017   Procedure: IRRIGATION AND DEBRIDEMENT PERINEAL ABSCESS;  Surgeon: Greer Pickerel, MD;  Location: [place];  Service:  General;  Laterality: N/A;  . MENISCUS REPAIR Right 2013  . ROTATOR CUFF REPAIR Right 2005  . TONSILLECTOMY     as a child  . TOTAL ABDOMINAL HYSTERECTOMY  1988  . TOTAL KNEE ARTHROPLASTY Left   . WRIST RECONSTRUCTION Right 2008    There were no vitals filed for this visit.  Subjective Assessment - 07/08/19 1120    Subjective  Pt reoprts she feels good today, reports some discomfort Lt>Rt hamstring pain maybe 1-2/10.  Back feels good today.    Pertinent History  RA, HTN    Patient Stated Goals  to get back to walking more    Currently in Pain?  Yes    Pain Score  2     Pain Location  Leg    Pain Orientation  Left;Right    Pain Descriptors / Indicators  Aching    Pain Type  Acute pain   subacute   Pain Radiating Towards  it feels sharp burning in the back of her thights and occasionally goes down to her calf    Pain Onset  More than a month ago    Pain Frequency  Intermittent    Aggravating Factors   walking, transitional movement, sitting on hard surface    Pain Relieving Factors  unsure  Effect of Pain on Daily Activities  moderate, it has limited her from exerciseing                       [facility] Adult PT Treatment/Exercise - 07/08/19 0001      Exercises   Exercises  Lumbar      Lumbar Exercises: Standing   Other Standing Lumbar Exercises  Hip abduction 10x 3" wiht UE support    Other Standing Lumbar Exercises  SLS Lt 60", Rt 45" prior discomfort      Lumbar Exercises: Supine   Bridge  10 reps    Bridge Limitations  cueing for wider base, reports less hs irritated     Other Supine Lumbar Exercises  Sciatic nerve glide, increased segmental with additional knee then hip movements paired with DF/PF      Lumbar Exercises: Sidelying   Clam  15 reps;3 seconds    Clam Limitations  RTB      Manual Therapy   Manual Therapy  Soft tissue mobilization    Manual therapy comments  Manual complete separate than rest of tx    Soft tissue mobilization  STM to  lumbar paraspinals Rt>Lt, gluteal and medial hamstring in prone; Noted tenderness Rt PSIS wiht palpatin               PT Short Term Goals - 07/03/19 1658      PT SHORT TERM GOAL #1   Title  Patient will be independent with HEP, updated PRN, to improve neurodynamic mobility and flexibility to reduce leg pain.    Time  3    Period  Weeks    Status  New    Target Date  07/24/19      PT SHORT TERM GOAL #2   Title  Patient will have negative SLR test bil LE to indicate improvement in neurodynamic mobility.    Time  3    Period  Weeks    Status  New      PT SHORT TERM GOAL #3   Title  Patient will perform 2 MWT with no increase in LE pain or deviations from normalized gait.    Time  3    Period  Weeks    Status  New        PT Long Term Goals - 07/03/19 1700      PT LONG TERM GOAL #1   Title  Patient will have negative slump test bil to indicate further improvement in neurodynamic tension (test this once SLR is negative)    Time  6    Period  Weeks    Status  New    Target Date  08/14/19      PT LONG TERM GOAL #2   Title  Patient will improve all LE muscle groups to 5/5 to indicate significant improvement in weak hip musculature.    Time  6    Period  Weeks    Status  New      PT LONG TERM GOAL #3   Title  Patient will complete 6 minute walk test with no increase in LE pain and no deviations from normal gait to indicate improved tolerance to walking.    Time  6    Period  Weeks    Status  New            Plan - 07/08/19 1147    Clinical Impression Statement  Added standing glut med strengthening including abduciton and SLS  as well as theraband resistance wiht clam exercise.  Increased segmental nerve glides with additional knee and hip movements for sciatic nerve stretches, pt. c/o calf cramping during glide, reports decreased radicular symptoms following.  EOS with manual soft tissue mobilization to address restricitons in gluteal and hamstring mm with  reports of relief following.    Personal Factors and Comorbidities  Age;Comorbidity 2    Comorbidities  HTN, RA    Examination-Activity Limitations  Locomotion Level    Examination-Participation Restrictions  Community Activity    Stability/Clinical Decision Making  Evolving/Moderate complexity    Clinical Decision Making  Low    Rehab Potential  Good    PT Frequency  2x / week    PT Duration  6 weeks    PT Treatment/Interventions  ADLs/Self Care Home Management;Aquatic Therapy;Electrical Stimulation;Cryotherapy;Iontophoresis 4mg /ml Dexamethasone;Moist Heat;Functional mobility training;DME Instruction;Gait training;Stair training;Therapeutic exercise;Therapeutic activities;Neuromuscular re-education;Balance training;Patient/family education;Manual techniques;Passive range of motion;Dry needling;Taping;Spinal Manipulations;Joint Manipulations    PT Next Visit Plan  Continue manual therapy for lumbar spine mobilization and hamstring STM along sciatic nerve tract.  Segmental sciatic nerve glides. Provide hip abd and ext strengthening.    PT Home Exercise Plan  07/07/19: segmental sciatic nerve glide and sidelying clam, bridge       Patient will benefit from skilled therapeutic intervention in order to improve the following deficits and impairments:  Abnormal gait, Decreased balance, Decreased mobility, Difficulty walking, Decreased activity tolerance, Pain, Impaired flexibility, Increased fascial restricitons  Visit Diagnosis: 1. Pain in left leg   2. Pain in right leg   3. Chronic midline low back pain with bilateral sciatica        Problem List Patient Active Problem List   Diagnosis Date Noted  . DOE (dyspnea on exertion) 04/09/2019  . Exertional chest pain 04/09/2019  . Claudication ([place]) 04/09/2019  . Leukocytosis 12/04/2017  . AKI (acute kidney injury) ([place]) 11/29/2017  . Rheumatoid arthritis ([place]) 11/29/2017  . Cellulitis and abscess of buttock 11/28/2017  . Cholelithiasis  with chronic cholecystitis 01/30/2017  . History of shingles 06/06/2016  . Genital herpes simplex 06/06/2016  . Seasonal allergies 06/06/2016  . Seborrheic dermatitis   . Prediabetes   . Anemia   . GERD (gastroesophageal reflux disease)   . Vitamin D deficiency   . Insomnia, unspecified   . Essential hypertension    Ihor Austin, [place]; Martin  Aldona Lento 07/08/2019, 12:19 PM  [place], [place], [address] Phone: [phone]   Fax:  [phone]  Name: Courtney Crane MRN: [id number] Date of Birth: 02/19/1951

## 2019-07-15 ENCOUNTER — Encounter (HOSPITAL_COMMUNITY): Payer: Self-pay

## 2019-07-15 ENCOUNTER — Ambulatory Visit (HOSPITAL_COMMUNITY): Payer: PPO

## 2019-07-15 ENCOUNTER — Other Ambulatory Visit: Payer: Self-pay

## 2019-07-15 DIAGNOSIS — M79604 Pain in right leg: Secondary | ICD-10-CM

## 2019-07-15 DIAGNOSIS — M79605 Pain in left leg: Secondary | ICD-10-CM

## 2019-07-15 DIAGNOSIS — M5441 Lumbago with sciatica, right side: Secondary | ICD-10-CM

## 2019-07-15 DIAGNOSIS — G8929 Other chronic pain: Secondary | ICD-10-CM

## 2019-07-15 NOTE — Therapy (Signed)
[place], [place], [address] Phone: [phone]   Fax:  [phone]  Physical Therapy Treatment  Patient Details  Name: Courtney Crane MRN: [id number] Date of Birth: 10/24/1951 Referring Provider (PT): Alda Berthold, DO   Encounter Date: 07/15/2019  PT End of Session - 07/15/19 1133    Visit Number  4    Number of Visits  12    Date for PT Re-Evaluation  08/14/19    Authorization Type  [employer] (no limit, no auth, $15 co-pay)    Authorization Time Period  07/03/19-08/14/19    PT Start Time  1115    PT Stop Time  1200    PT Time Calculation (min)  45 min    Activity Tolerance  Patient tolerated treatment well    Behavior During Therapy  [facility] for tasks assessed/performed       Past Medical History:  Diagnosis Date  . Anemia   . Anxiety   . Arthritis   . Asthma   . Colon polyps   . Complication of anesthesia    "sometimes I dont like to wake up after " ie difficult to awaken   . Depression   . Diverticulitis   . Diverticulosis   . GERD (gastroesophageal reflux disease)   . HLD (hyperlipidemia)   . HTN (hypertension)   . IBS (irritable bowel syndrome)   . Insomnia   . MRSA infection   . Osteoarthritis   . PONV (postoperative nausea and vomiting)   . RA (rheumatoid arthritis) ([place])   . Rheumatoid arthritis ([place])   . Seasonal allergies   . Shingles   . Status post dilation of esophageal narrowing   . Vitamin D deficiency     Past Surgical History:  Procedure Laterality Date  . APPENDECTOMY  1975  . bladder tack  1990  . CHOLECYSTECTOMY N/A 01/31/2017   Procedure: LAPAROSCOPIC CHOLECYSTECTOMY WITH INTRAOPERATIVE CHOLANGIOGRAM;  Surgeon: Armandina Gemma, MD;  Location: [facility];  Service: General;  Laterality: N/A;  . INCISION AND DRAINAGE PERIRECTAL ABSCESS N/A 12/02/2017   Procedure: IRRIGATION AND DEBRIDEMENT PERINEAL ABSCESS;  Surgeon: Greer Pickerel, MD;  Location: [place];  Service:  General;  Laterality: N/A;  . MENISCUS REPAIR Right 2013  . ROTATOR CUFF REPAIR Right 2005  . TONSILLECTOMY     as a child  . TOTAL ABDOMINAL HYSTERECTOMY  1988  . TOTAL KNEE ARTHROPLASTY Left   . WRIST RECONSTRUCTION Right 2008    There were no vitals filed for this visit.  Subjective Assessment - 07/15/19 1118    Subjective  Pt reports no back pain. Pt reports hamstrings and occasionally quad pain, depending on day and activity right versus left will be worse.    Pertinent History  RA, HTN    Limitations  Walking    Patient Stated Goals  to get back to walking more    Currently in Pain?  Yes    Pain Score  2     Pain Location  Leg    Pain Orientation  Left    Pain Descriptors / Indicators  Sore    Pain Type  Acute pain    Pain Radiating Towards  none    Pain Onset  More than a month ago    Pain Frequency  Intermittent    Aggravating Factors   walking, transitional movement, sitting on hard surface    Pain Relieving Factors  unsure    Effect of Pain on Daily  Activities  moderate, it has limited her from exercising    Multiple Pain Sites  No            [facility] Adult PT Treatment/Exercise - 07/15/19 0001      Ambulation/Gait   Stairs  Yes    Stair Management Technique  One rail Right;Alternating pattern    Number of Stairs  8    Height of Stairs  6    Gait Comments  inconsistent L hamstring pain with ascent      Lumbar Exercises: Stretches   Active Hamstring Stretch  2 reps;30 seconds    Active Hamstring Stretch Limitations  BLE, seated    Figure 4 Stretch  2 reps;20 seconds    Figure 4 Stretch Limitations  BLE, seated      Lumbar Exercises: Standing   Other Standing Lumbar Exercises  deadlifts, 1# bar for form, x10 reps      Lumbar Exercises: Supine   Bridge  10 reps    Bridge Limitations  cues for isometric glute contraction before rising    Bridge with clamshell  10 reps    Bridge with [employer] Limitations  wide foot position, RTB around thighs    Other  Supine Lumbar Exercises  Sciatic nerve glides, 10 reps BLE      Lumbar Exercises: Sidelying   Clam  15 reps;3 seconds    Clam Limitations  RTB      Manual Therapy   Manual Therapy  Soft tissue mobilization    Manual therapy comments  Manual complete separate than rest of tx    Soft tissue mobilization  STM to bil hamstrings at ischial tuberosity, tenderness bil, palpable restrictions on L             PT Education - 07/15/19 1239    Education Details  Exercise technique, updated HEP to use RTB for bridges, dry needling initial conversation    Person(s) Educated  Patient    Methods  Explanation;Demonstration;Handout    Comprehension  Verbalized understanding;Returned demonstration       PT Short Term Goals - 07/15/19 1238      PT SHORT TERM GOAL #1   Title  Patient will be independent with HEP, updated PRN, to improve neurodynamic mobility and flexibility to reduce leg pain.    Time  3    Period  Weeks    Status  On-going    Target Date  07/24/19      PT SHORT TERM GOAL #2   Title  Patient will have negative SLR test bil LE to indicate improvement in neurodynamic mobility.    Time  3    Period  Weeks    Status  On-going      PT SHORT TERM GOAL #3   Title  Patient will perform 2 MWT with no increase in LE pain or deviations from normalized gait.    Time  3    Period  Weeks    Status  On-going        PT Long Term Goals - 07/15/19 1239      PT LONG TERM GOAL #1   Title  Patient will have negative slump test bil to indicate further improvement in neurodynamic tension (test this once SLR is negative)    Time  6    Period  Weeks    Status  On-going      PT LONG TERM GOAL #2   Title  Patient will improve all LE muscle groups to  5/5 to indicate significant improvement in weak hip musculature.    Time  6    Period  Weeks    Status  On-going      PT LONG TERM GOAL #3   Title  Patient will complete 6 minute walk test with no increase in LE pain and no deviations  from normal gait to indicate improved tolerance to walking.    Time  6    Period  Weeks    Status  On-going            Plan - 07/15/19 1133    Clinical Impression Statement  Continued with pt's established POC. Initiated treatment session with stretching throughout bil piriformis and hamstring in sitting position, denies pain. Pt with improved glute contraction and reduction in hamstring discomfort with wide BOS and RTB around thighs to engage glute med better with bridges. Pt tender to palpation at hamstring insertion at ischial tuberosity with palpable restrictions within L, none felt on R. No improvement in pain with MFR to either hamstring and no change in symptoms after manual STM. Also, pain upon palpation of hamstrings, but denies pain with stretching or MMT in prone. Added dead lifts practicing form to increase glute activation and added stretching for hamstrings. Ended with stair training, completing with reciprocal form, single handrail and reports occasional L hamstring pain with ascending, but inconsistent. Initiated dry needling conversation for pt to do research and consider in future appointments with dry needling certified therapist. Continue to progress as able.    Personal Factors and Comorbidities  Age;Comorbidity 2    Comorbidities  HTN, RA    Examination-Activity Limitations  Locomotion Level    Examination-Participation Restrictions  Community Activity    Stability/Clinical Decision Making  Evolving/Moderate complexity    Rehab Potential  Good    PT Frequency  2x / week    PT Duration  6 weeks    PT Treatment/Interventions  ADLs/Self Care Home Management;Aquatic Therapy;Electrical Stimulation;Cryotherapy;Iontophoresis 4mg /ml Dexamethasone;Moist Heat;Functional mobility training;DME Instruction;Gait training;Stair training;Therapeutic exercise;Therapeutic activities;Neuromuscular re-education;Balance training;Patient/family education;Manual techniques;Passive range of  motion;Dry needling;Taping;Spinal Manipulations;Joint Manipulations    PT Next Visit Plan  Continue manual therapy for lumbar spine mobilization and hamstring STM along sciatic nerve tract.  Segmental sciatic nerve glides. Provide hip abd and ext strengthening.    PT Home Exercise Plan  07/07/19: segmental sciatic nerve glide and sidelying clam, bridge; 8/26: HS stretch on step, RTB for bridges to engage glutes    Consulted and Agree with Plan of Care  Patient       Patient will benefit from skilled therapeutic intervention in order to improve the following deficits and impairments:  Abnormal gait, Decreased balance, Decreased mobility, Difficulty walking, Decreased activity tolerance, Pain, Impaired flexibility, Increased fascial restricitons  Visit Diagnosis: Pain in left leg  Pain in right leg  Chronic midline low back pain with bilateral sciatica     Problem List Patient Active Problem List   Diagnosis Date Noted  . DOE (dyspnea on exertion) 04/09/2019  . Exertional chest pain 04/09/2019  . Claudication ([place]) 04/09/2019  . Leukocytosis 12/04/2017  . AKI (acute kidney injury) ([place]) 11/29/2017  . Rheumatoid arthritis ([place]) 11/29/2017  . Cellulitis and abscess of buttock 11/28/2017  . 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 History of shingles 06/06/2016  . Genital herpes simplex 06/06/2016  . Seasonal allergies 06/06/2016  . Seborrheic dermatitis   . Prediabetes   . Anemia   . GERD (gastroesophageal reflux disease)   . Vitamin D  deficiency   . Insomnia, unspecified   . Essential hypertension      Talbot Grumbling PT, DPT 07/15/19, 12:42 PM [place], [place], [address] Phone: [phone]   Fax:  [phone]  Name: Xochilth Bruzek MRN: [id number] Date of Birth: 17-Jan-1951

## 2019-07-17 ENCOUNTER — Other Ambulatory Visit: Payer: Self-pay

## 2019-07-17 ENCOUNTER — Ambulatory Visit (HOSPITAL_COMMUNITY): Payer: PPO | Admitting: Physical Therapy

## 2019-07-17 DIAGNOSIS — M79604 Pain in right leg: Secondary | ICD-10-CM

## 2019-07-17 DIAGNOSIS — G8929 Other chronic pain: Secondary | ICD-10-CM

## 2019-07-17 DIAGNOSIS — M79605 Pain in left leg: Secondary | ICD-10-CM

## 2019-07-17 NOTE — Therapy (Signed)
[place], [place], [address] Phone: [phone]   Fax:  [phone]  Physical Therapy Treatment  Patient Details  Name: Courtney Crane MRN: [id number] Date of Birth: [birth year] Referring Provider (PT): Alda Berthold, DO   Encounter Date: 07/17/2019  PT End of Session - 07/17/19 1108    Visit Number  5    Number of Visits  12    Date for PT Re-Evaluation  08/14/19    Authorization Type  [employer] (no limit, no auth, $15 co-pay)    Authorization Time Period  07/03/19-08/14/19    PT Start Time  1020    PT Stop Time  1105    PT Time Calculation (min)  45 min    Activity Tolerance  Patient tolerated treatment well    Behavior During Therapy  [facility] for tasks assessed/performed       Past Medical History:  Diagnosis Date  . Anemia   . Anxiety   . Arthritis   . Asthma   . Colon polyps   . Complication of anesthesia    "sometimes I dont like to wake up after " ie difficult to awaken   . Depression   . Diverticulitis   . Diverticulosis   . GERD (gastroesophageal reflux disease)   . HLD (hyperlipidemia)   . HTN (hypertension)   . IBS (irritable bowel syndrome)   . Insomnia   . MRSA infection   . Osteoarthritis   . PONV (postoperative nausea and vomiting)   . RA (rheumatoid arthritis) ([place])   . Rheumatoid arthritis ([place])   . Seasonal allergies   . Shingles   . Status post dilation of esophageal narrowing   . Vitamin D deficiency     Past Surgical History:  Procedure Laterality Date  . APPENDECTOMY  1975  . bladder tack  1990  . CHOLECYSTECTOMY N/A 01/31/2017   Procedure: LAPAROSCOPIC CHOLECYSTECTOMY WITH INTRAOPERATIVE CHOLANGIOGRAM;  Surgeon: Armandina Gemma, MD;  Location: [facility];  Service: General;  Laterality: N/A;  . INCISION AND DRAINAGE PERIRECTAL ABSCESS N/A 12/02/2017   Procedure: IRRIGATION AND DEBRIDEMENT PERINEAL ABSCESS;  Surgeon: Greer Pickerel, MD;  Location: Uncertain;  Service:  General;  Laterality: N/A;  . MENISCUS REPAIR Right 2013  . ROTATOR CUFF REPAIR Right 2005  . TONSILLECTOMY     as a child  . TOTAL ABDOMINAL HYSTERECTOMY  1988  . TOTAL KNEE ARTHROPLASTY Left   . WRIST RECONSTRUCTION Right 2008    There were no vitals filed for this visit.  Subjective Assessment - 07/17/19 1011    Subjective  PT states she has no pain at this time.  She is doing her exercises.    Pertinent History  RA, HTN    Limitations  Walking    Patient Stated Goals  to get back to walking more    Currently in Pain?  No/denies    Pain Onset  More than a month ago              [facility] Adult PT Treatment/Exercise - 07/17/19 0001      Exercises   Exercises  Lumbar      Lumbar Exercises: Stretches   Active Hamstring Stretch  2 reps;30 seconds    Active Hamstring Stretch Limitations  followed by flossing     Figure 4 Stretch  2 reps;20 seconds    Figure 4 Stretch Limitations  BLE, seated    Other Lumbar Stretch Exercise  hip and thoracic excursion  x 3       Lumbar Exercises: Standing   Other Standing Lumbar Exercises  PALOF with red t band x 10 each side.       Lumbar Exercises: Prone   Straight Leg Raise  10 reps      Manual Therapy   Manual Therapy  Soft tissue mobilization;Muscle Energy Technique    Manual therapy comments  Manual complete separate than rest of tx    Soft tissue mobilization  STM to bil hamstrings at ischial tuberosity, as well as low back     Muscle Energy Technique  Lt SI sulcus depressed mm energy to correct with good results.                PT Short Term Goals - 07/15/19 1238      PT SHORT TERM GOAL #1   Title  Patient will be independent with HEP, updated PRN, to improve neurodynamic mobility and flexibility to reduce leg pain.    Time  3    Period  Weeks    Status  On-going    Target Date  07/24/19      PT SHORT TERM GOAL #2   Title  Patient will have negative SLR test bil LE to indicate improvement in neurodynamic  mobility.    Time  3    Period  Weeks    Status  On-going      PT SHORT TERM GOAL #3   Title  Patient will perform 2 MWT with no increase in LE pain or deviations from normalized gait.    Time  3    Period  Weeks    Status  On-going        PT Long Term Goals - 07/15/19 1239      PT LONG TERM GOAL #1   Title  Patient will have negative slump test bil to indicate further improvement in neurodynamic tension (test this once SLR is negative)    Time  6    Period  Weeks    Status  On-going      PT LONG TERM GOAL #2   Title  Patient will improve all LE muscle groups to 5/5 to indicate significant improvement in weak hip musculature.    Time  6    Period  Weeks    Status  On-going      PT LONG TERM GOAL #3   Title  Patient will complete 6 minute walk test with no increase in LE pain and no deviations from normal gait to indicate improved tolerance to walking.    Time  6    Period  Weeks    Status  On-going            Plan - 07/17/19 1108    Clinical Impression Statement  Added excursion exercises to improve mobility, Palof and prone hip extension added to improve stability.  Manual done to correct SI dysfunction as well as decrease discomfort.    Personal Factors and Comorbidities  Age;Comorbidity 2    Comorbidities  HTN, RA    Examination-Activity Limitations  Locomotion Level    Examination-Participation Restrictions  Community Activity    Stability/Clinical Decision Making  Evolving/Moderate complexity    Rehab Potential  Good    PT Frequency  2x / week    PT Duration  6 weeks    PT Treatment/Interventions  ADLs/Self Care Home Management;Aquatic Therapy;Electrical Stimulation;Cryotherapy;Iontophoresis 4mg /ml Dexamethasone;Moist Heat;Functional mobility training;DME Instruction;Gait training;Stair training;Therapeutic exercise;Therapeutic activities;Neuromuscular re-education;Balance training;Patient/family education;Manual techniques;Passive  range of motion;Dry  needling;Taping;Spinal Manipulations;Joint Manipulations    PT Next Visit Plan  Continue manual therapy for lumbar spine mobilization and hamstring STM along sciatic nerve tract.  Segmental sciatic nerve glides. Provide hip abd  strengthening..    PT Home Exercise Plan  07/07/19: segmental sciatic nerve glide and sidelying clam, bridge; 8/26: HS stretch on step, RTB for bridges to engage glutes    Consulted and Agree with Plan of Care  Patient       Patient will benefit from skilled therapeutic intervention in order to improve the following deficits and impairments:  Abnormal gait, Decreased balance, Decreased mobility, Difficulty walking, Decreased activity tolerance, Pain, Impaired flexibility, Increased fascial restricitons  Visit Diagnosis: Pain in left leg  Pain in right leg  Chronic midline low back pain with bilateral sciatica     Problem List Patient Active Problem List   Diagnosis Date Noted  . DOE (dyspnea on exertion) 04/09/2019  . Exertional chest pain 04/09/2019  . Claudication ([place]) 04/09/2019  . Leukocytosis 12/04/2017  . AKI (acute kidney injury) ([place]) 11/29/2017  . Rheumatoid arthritis ([place]) 11/29/2017  . Cellulitis and abscess of buttock 11/28/2017  . 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 History of shingles 06/06/2016  . Genital herpes simplex 06/06/2016  . Seasonal allergies 06/06/2016  . Seborrheic dermatitis   . Prediabetes   . Anemia   . GERD (gastroesophageal reflux disease)   . Vitamin D deficiency   . Insomnia, unspecified   . Essential hypertension     Rayetta Humphrey, [place] CLT [phone] 07/17/2019, 11:11 AM  [place], [place], [address] Phone: [phone]   Fax:  [phone]  Name: Jeovanna Austen MRN: [id number] Date of Birth: 02/04/1951

## 2019-07-21 ENCOUNTER — Ambulatory Visit (HOSPITAL_COMMUNITY): Payer: PPO | Attending: Neurology

## 2019-07-21 ENCOUNTER — Other Ambulatory Visit: Payer: Self-pay

## 2019-07-21 ENCOUNTER — Encounter (HOSPITAL_COMMUNITY): Payer: Self-pay

## 2019-07-21 DIAGNOSIS — G8929 Other chronic pain: Secondary | ICD-10-CM | POA: Diagnosis not present

## 2019-07-21 DIAGNOSIS — M5442 Lumbago with sciatica, left side: Secondary | ICD-10-CM | POA: Insufficient documentation

## 2019-07-21 DIAGNOSIS — M5441 Lumbago with sciatica, right side: Secondary | ICD-10-CM | POA: Insufficient documentation

## 2019-07-21 DIAGNOSIS — M79604 Pain in right leg: Secondary | ICD-10-CM | POA: Insufficient documentation

## 2019-07-21 DIAGNOSIS — M79605 Pain in left leg: Secondary | ICD-10-CM

## 2019-07-21 NOTE — Therapy (Signed)
[place], [place], [address] Phone: [phone]   Fax:  [phone]  Physical Therapy Treatment  Patient Details  Name: Courtney Crane MRN: [id number] Date of Birth: 1951/09/08 Referring Provider (PT): Alda Berthold, DO   Encounter Date: 07/21/2019  PT End of Session - 07/21/19 1413    Visit Number  6    Number of Visits  12    Date for PT Re-Evaluation  08/14/19    Authorization Type  [employer] (no limit, no auth, $15 co-pay)    Authorization Time Period  07/03/19-08/14/19    PT Start Time  1345    PT Stop Time  1430    PT Time Calculation (min)  45 min    Activity Tolerance  Patient tolerated treatment well    Behavior During Therapy  [facility] for tasks assessed/performed       Past Medical History:  Diagnosis Date  . Anemia   . Anxiety   . Arthritis   . Asthma   . Colon polyps   . Complication of anesthesia    "sometimes I dont like to wake up after " ie difficult to awaken   . Depression   . Diverticulitis   . Diverticulosis   . GERD (gastroesophageal reflux disease)   . HLD (hyperlipidemia)   . HTN (hypertension)   . IBS (irritable bowel syndrome)   . Insomnia   . MRSA infection   . Osteoarthritis   . PONV (postoperative nausea and vomiting)   . RA (rheumatoid arthritis) ([place])   . Rheumatoid arthritis ([place])   . Seasonal allergies   . Shingles   . Status post dilation of esophageal narrowing   . Vitamin D deficiency     Past Surgical History:  Procedure Laterality Date  . APPENDECTOMY  1975  . bladder tack  1990  . CHOLECYSTECTOMY N/A 01/31/2017   Procedure: LAPAROSCOPIC CHOLECYSTECTOMY WITH INTRAOPERATIVE CHOLANGIOGRAM;  Surgeon: Armandina Gemma, MD;  Location: [facility];  Service: General;  Laterality: N/A;  . INCISION AND DRAINAGE PERIRECTAL ABSCESS N/A 12/02/2017   Procedure: IRRIGATION AND DEBRIDEMENT PERINEAL ABSCESS;  Surgeon: Greer Pickerel, MD;  Location: [facility];  Service:  General;  Laterality: N/A;  . MENISCUS REPAIR Right 2013  . ROTATOR CUFF REPAIR Right 2005  . TONSILLECTOMY     as a child  . TOTAL ABDOMINAL HYSTERECTOMY  1988  . TOTAL KNEE ARTHROPLASTY Left   . WRIST RECONSTRUCTION Right 2008    There were no vitals filed for this visit.  Subjective Assessment - 07/21/19 1349    Subjective  Pt reports pain is not worse, but more often since beginning therapy. Pt reports back hurts today and it is beginning to effect her sleep. Pt reports no changes in funcitonal activity. Pt reports taking tylenol and applying asperteme prior to therapy to help with soreness.    Pertinent History  RA, HTN    Limitations  Walking    Patient Stated Goals  to get back to walking more    Currently in Pain?  Yes    Pain Score  4     Pain Location  Back    Pain Orientation  Right    Pain Descriptors / Indicators  Sore    Pain Radiating Towards  down RLE into toes    Pain Onset  More than a month ago    Pain Frequency  Intermittent    Aggravating Factors   walking, transitional movement, sitting on  hard surface    Pain Relieving Factors  unsure    Effect of Pain on Daily Activities  moderate, it has limited her from exercising            [facility] Adult PT Treatment/Exercise - 07/21/19 0001      Lumbar Exercises: Stretches   Active Hamstring Stretch  Right;30 seconds    Active Hamstring Stretch Limitations  prone    Figure 4 Stretch  2 reps;30 seconds    Figure 4 Stretch Limitations  BLE, supine      Lumbar Exercises: Supine   Ab Set  10 reps    AB Set Limitations  TA and kegel with exhale, hooklying position    Bent Knee Raise  10 reps    Bent Knee Raise Limitations  with TA set    Bridge  15 reps      Manual Therapy   Manual Therapy  Soft tissue mobilization;Myofascial release    Manual therapy comments  Manual complete separate than rest of tx    Soft tissue mobilization  instrument assisted STM using green ball to R paraspinals and R piriformis to  decrease palpable restrictions and reduce pain    Myofascial Release  R piriformis with centralization/cessation of peripheral symptoms             PT Education - 07/21/19 1410    Education Details  Exercise technique, updated HEP    Person(s) Educated  Patient    Methods  Explanation;Demonstration;Handout    Comprehension  Verbalized understanding;Returned demonstration       PT Short Term Goals - 07/15/19 1238      PT SHORT TERM GOAL #1   Title  Patient will be independent with HEP, updated PRN, to improve neurodynamic mobility and flexibility to reduce leg pain.    Time  3    Period  Weeks    Status  On-going    Target Date  07/24/19      PT SHORT TERM GOAL #2   Title  Patient will have negative SLR test bil LE to indicate improvement in neurodynamic mobility.    Time  3    Period  Weeks    Status  On-going      PT SHORT TERM GOAL #3   Title  Patient will perform 2 MWT with no increase in LE pain or deviations from normalized gait.    Time  3    Period  Weeks    Status  On-going        PT Long Term Goals - 07/15/19 1239      PT LONG TERM GOAL #1   Title  Patient will have negative slump test bil to indicate further improvement in neurodynamic tension (test this once SLR is negative)    Time  6    Period  Weeks    Status  On-going      PT LONG TERM GOAL #2   Title  Patient will improve all LE muscle groups to 5/5 to indicate significant improvement in weak hip musculature.    Time  6    Period  Weeks    Status  On-going      PT LONG TERM GOAL #3   Title  Patient will complete 6 minute walk test with no increase in LE pain and no deviations from normal gait to indicate improved tolerance to walking.    Time  6    Period  Weeks    Status  On-going  Plan - 07/21/19 1414    Clinical Impression Statement  Pt with increased pain for 2 days in low back, R>L, when presenting to therapy this date. Started with instrument assisted STM and MFR  to R lumbar paraspinals and R piriformis. Pt with pain and tingling down RLE resolved with MFR to R piriformis within 2 minutes. Added core activation exercises to assist in alleviating back pain this date. Pt with TA activation using exhalation cues and able to progress to bent knee marching with good stability and denies pain. Pt with negative SLR test bilaterally. Pt with same pain at EOS compared to start of session despite manual and exercises; limited in session due to pain. Continue to progress as able.    Personal Factors and Comorbidities  Age;Comorbidity 2    Comorbidities  HTN, RA    Examination-Activity Limitations  Locomotion Level    Examination-Participation Restrictions  Community Activity    Stability/Clinical Decision Making  Evolving/Moderate complexity    Rehab Potential  Good    PT Frequency  2x / week    PT Duration  6 weeks    PT Treatment/Interventions  ADLs/Self Care Home Management;Aquatic Therapy;Electrical Stimulation;Cryotherapy;Iontophoresis 4mg /ml Dexamethasone;Moist Heat;Functional mobility training;DME Instruction;Gait training;Stair training;Therapeutic exercise;Therapeutic activities;Neuromuscular re-education;Balance training;Patient/family education;Manual techniques;Passive range of motion;Dry needling;Taping;Spinal Manipulations;Joint Manipulations    PT Next Visit Plan  Continue manual therapy for lumbar spine, piriformis and hamstring STM along sciatic nerve tract. Progress core strengthening, BLE strengthening and stretching exercises.    PT Home Exercise Plan  07/07/19: segmental sciatic nerve glide and sidelying clam, bridge; 8/26: HS stretch on step, RTB for bridges to engage glutes; 9/1: TA activation with exhale and kegel, TA with bent knee marching alt    Consulted and Agree with Plan of Care  Patient       Patient will benefit from skilled therapeutic intervention in order to improve the following deficits and impairments:  Abnormal gait, Decreased  balance, Decreased mobility, Difficulty walking, Decreased activity tolerance, Pain, Impaired flexibility, Increased fascial restricitons  Visit Diagnosis: Pain in left leg  Pain in right leg  Chronic midline low back pain with bilateral sciatica     Problem List Patient Active Problem List   Diagnosis Date Noted  . DOE (dyspnea on exertion) 04/09/2019  . Exertional chest pain 04/09/2019  . Claudication ([place]) 04/09/2019  . Leukocytosis 12/04/2017  . AKI (acute kidney injury) ([place]) 11/29/2017  . Rheumatoid arthritis ([place]) 11/29/2017  . Cellulitis and abscess of buttock 11/28/2017  . 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 History of shingles 06/06/2016  . Genital herpes simplex 06/06/2016  . Seasonal allergies 06/06/2016  . Seborrheic dermatitis   . Prediabetes   . Anemia   . GERD (gastroesophageal reflux disease)   . Vitamin D deficiency   . Insomnia, unspecified   . Essential hypertension       Talbot Grumbling PT, DPT 07/21/19, 2:45 PM [place], [place], [address] Phone: [phone]   Fax:  [phone]  Name: Deonte Cangiano MRN: [id number] Date of Birth: 01-10-1951

## 2019-07-24 ENCOUNTER — Ambulatory Visit (HOSPITAL_COMMUNITY): Payer: PPO

## 2019-07-24 ENCOUNTER — Encounter (HOSPITAL_COMMUNITY): Payer: Self-pay

## 2019-07-24 ENCOUNTER — Other Ambulatory Visit: Payer: Self-pay

## 2019-07-24 DIAGNOSIS — M5442 Lumbago with sciatica, left side: Secondary | ICD-10-CM

## 2019-07-24 DIAGNOSIS — M79605 Pain in left leg: Secondary | ICD-10-CM

## 2019-07-24 DIAGNOSIS — G8929 Other chronic pain: Secondary | ICD-10-CM

## 2019-07-24 DIAGNOSIS — M79604 Pain in right leg: Secondary | ICD-10-CM

## 2019-07-24 NOTE — Therapy (Signed)
[place], [place], [address] Phone: [phone]   Fax:  [phone]  Physical Therapy Treatment  Patient Details  Name: Courtney Crane MRN: [id number] Date of Birth: 10-Nov-1951 Referring Provider (PT): Alda Berthold, DO   Encounter Date: 07/24/2019  PT End of Session - 07/24/19 1450    Visit Number  7    Number of Visits  12    Date for PT Re-Evaluation  08/14/19    Authorization Type  [employer] (no limit, no auth, $15 co-pay)    Authorization Time Period  07/03/19-08/14/19    PT Start Time  1404    PT Stop Time  1446    PT Time Calculation (min)  42 min    Activity Tolerance  Patient tolerated treatment well    Behavior During Therapy  [facility] for tasks assessed/performed       Past Medical History:  Diagnosis Date  . Anemia   . Anxiety   . Arthritis   . Asthma   . Colon polyps   . Complication of anesthesia    "sometimes I dont like to wake up after " ie difficult to awaken   . Depression   . Diverticulitis   . Diverticulosis   . GERD (gastroesophageal reflux disease)   . HLD (hyperlipidemia)   . HTN (hypertension)   . IBS (irritable bowel syndrome)   . Insomnia   . MRSA infection   . Osteoarthritis   . PONV (postoperative nausea and vomiting)   . RA (rheumatoid arthritis) ([place])   . Rheumatoid arthritis ([place])   . Seasonal allergies   . Shingles   . Status post dilation of esophageal narrowing   . Vitamin D deficiency     Past Surgical History:  Procedure Laterality Date  . APPENDECTOMY  1975  . bladder tack  1990  . CHOLECYSTECTOMY N/A 01/31/2017   Procedure: LAPAROSCOPIC CHOLECYSTECTOMY WITH INTRAOPERATIVE CHOLANGIOGRAM;  Surgeon: Armandina Gemma, MD;  Location: [facility];  Service: General;  Laterality: N/A;  . INCISION AND DRAINAGE PERIRECTAL ABSCESS N/A 12/02/2017   Procedure: IRRIGATION AND DEBRIDEMENT PERINEAL ABSCESS;  Surgeon: Greer Pickerel, MD;  Location: [facility];  Service:  General;  Laterality: N/A;  . MENISCUS REPAIR Right 2013  . ROTATOR CUFF REPAIR Right 2005  . TONSILLECTOMY     as a child  . TOTAL ABDOMINAL HYSTERECTOMY  1988  . TOTAL KNEE ARTHROPLASTY Left   . WRIST RECONSTRUCTION Right 2008    There were no vitals filed for this visit.  Subjective Assessment - 07/24/19 1406    Subjective  Patient reports her pain has been doing well over the last week but last night she woke up with severe pain. She states it lasted all night and when she woke up this morning it was still there. She reports the tingling in her LE's is present today even without applying pressure to the sore spot on her lumbar spine. She reports her back is sore across both sides but the greatest is still on the Rt side. She reports a 9/10 pain today in the back and into her Rt leg.    Pertinent History  RA, HTN    Limitations  Walking    Patient Stated Goals  to get back to walking more    Currently in Pain?  Yes    Pain Score  9     Pain Location  Back    Pain Orientation  Lower;Left;Right    Pain  Descriptors / Indicators  Aching;Tingling   hurts   Pain Type  Acute pain;Chronic pain   acute on chronic   Pain Radiating Towards  running into both LE's today, Rt>Lt    Pain Onset  Yesterday    Pain Frequency  Intermittent    Aggravating Factors   walking, transitional movements         [facility] Adult PT Treatment/Exercise - 07/24/19 0001      Lumbar Exercises: Stretches   Lower Trunk Rotation Limitations  10 reps bil, 3 sec holds, with LE's elevated on physioball    Other Lumbar Stretch Exercise  wall arch: 10 reps 3 sec holds at door      Lumbar Exercises: Seated   Other Seated Lumbar Exercises  Pt performed STM to Rt quad iwth massage stick, 2 minutes      Manual Therapy   Manual Therapy  Manual Traction;Soft tissue mobilization;Joint mobilization;Neural Stretch    Manual therapy comments  Manual complete separate than rest of tx    Joint Mobilization  Grade V thrust  mobilization to lumbar spine using Lt LE in supine: repeated thurst to pump lumbar spine with Lt LE in slight flex, add, and IR ofr hip to lock glenohumeral joint and faciliatte pump of lumbar spine to reduce inflammation in joint spaces    Soft tissue mobilization  Deep pressure and and soft tissue mobilization to piriformis on Rt hip, with PROM for IR/ER fo Rt hip in hooklying in supine    Manual Traction  10 minutes of manual traction in supine, hooklying (pulsing ans oscillations performed intermittently)    Neural Stretch  Segmental sciatic nerve glide in supine, Rt LE: 10x hip flexion; 10x knee extension, 10x ankle dorsiflexion        PT Education - 07/24/19 1457    Education Details  Educated on purpose of interventions throughout for pain relief and on mechanism of pain relief. Educated on exercises and self soft tissue mobilization. Educated on consolidating HEP to exercising that reduce symptoms.    Person(s) Educated  Patient    Methods  Explanation;Demonstration;Handout    Comprehension  Verbalized understanding;Returned demonstration       PT Short Term Goals - 07/15/19 1238      PT SHORT TERM GOAL #1   Title  Patient will be independent with HEP, updated PRN, to improve neurodynamic mobility and flexibility to reduce leg pain.    Time  3    Period  Weeks    Status  On-going    Target Date  07/24/19      PT SHORT TERM GOAL #2   Title  Patient will have negative SLR test bil LE to indicate improvement in neurodynamic mobility.    Time  3    Period  Weeks    Status  On-going      PT SHORT TERM GOAL #3   Title  Patient will perform 2 MWT with no increase in LE pain or deviations from normalized gait.    Time  3    Period  Weeks    Status  On-going        PT Long Term Goals - 07/15/19 1239      PT LONG TERM GOAL #1   Title  Patient will have negative slump test bil to indicate further improvement in neurodynamic tension (test this once SLR is negative)    Time  6     Period  Weeks    Status  On-going  PT LONG TERM GOAL #2   Title  Patient will improve all LE muscle groups to 5/5 to indicate significant improvement in weak hip musculature.    Time  6    Period  Weeks    Status  On-going      PT LONG TERM GOAL #3   Title  Patient will complete 6 minute walk test with no increase in LE pain and no deviations from normal gait to indicate improved tolerance to walking.    Time  6    Period  Weeks    Status  On-going        Plan - 07/24/19 1446    Clinical Impression Statement  Patient arrived with significantly elevated pain in lumbar spine and Rt LE tingling. Majority of session focused on pain relief and improving neural mobility. Manual traction to lumbar spine provided immediate relief for patient and thrust to Lt LE to "pump" lumbar spine and reduce inflammation alleviated Rt LE tingling. Patient also had Rt parastesia provocation with palpation to piriformis on Rt LE and soft tissue mobilization was performed to alleviate tenderness and address myofascial restrictions. Patient was able to complete lumbar stretches following manual interventions with no significant increase in symptoms and reported 4/10 for pain and LE symptoms at EOS. She was educated on self mobilization of quadriceps as she reports ongoing tightness in Rt thigh. She will continue to benefit from skilled PT interventions to address impairments and progress activity tolerance to improve QOL.    Personal Factors and Comorbidities  Age;Comorbidity 2    Comorbidities  HTN, RA    Examination-Activity Limitations  Locomotion Level    Examination-Participation Restrictions  Community Activity    Stability/Clinical Decision Making  Evolving/Moderate complexity    Rehab Potential  Good    PT Frequency  2x / week    PT Duration  6 weeks    PT Treatment/Interventions  ADLs/Self Care Home Management;Aquatic Therapy;Electrical Stimulation;Cryotherapy;Iontophoresis 4mg /ml  Dexamethasone;Moist Heat;Functional mobility training;DME Instruction;Gait training;Stair training;Therapeutic exercise;Therapeutic activities;Neuromuscular re-education;Balance training;Patient/family education;Manual techniques;Passive range of motion;Dry needling;Taping;Spinal Manipulations;Joint Manipulations    PT Next Visit Plan  Continue manual therapy for lumbar spine with traction and lumbar pump using Lt LE for pain relief. Educate patient on self mobilization for lumbar pump. Perform STM to piriformis and hamstring along sciatic nerve tract. Progress strengthening of core and move to standing for hip hinge and lumbar endurance exercises.    PT Home Exercise Plan  07/07/19: segmental sciatic nerve glide and sidelying clam, bridge; 8/26: HS stretch on step, RTB for bridges to engage glutes; 9/1: TA activation with exhale and kegel, TA with bent knee marching alt    Consulted and Agree with Plan of Care  Patient       Patient will benefit from skilled therapeutic intervention in order to improve the following deficits and impairments:  Abnormal gait, Decreased balance, Decreased mobility, Difficulty walking, Decreased activity tolerance, Pain, Impaired flexibility, Increased fascial restricitons  Visit Diagnosis: Pain in left leg  Pain in right leg  Chronic midline low back pain with bilateral sciatica     Problem List Patient Active Problem List   Diagnosis Date Noted  . DOE (dyspnea on exertion) 04/09/2019  . Exertional chest pain 04/09/2019  . Claudication ([place]) 04/09/2019  . Leukocytosis 12/04/2017  . AKI (acute kidney injury) ([place]) 11/29/2017  . Rheumatoid arthritis ([place]) 11/29/2017  . Cellulitis and abscess of buttock 11/28/2017  . 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 History of shingles 06/06/2016  .  Genital herpes simplex 06/06/2016  . Seasonal allergies 06/06/2016  . Seborrheic dermatitis   . Prediabetes   . Anemia   . GERD (gastroesophageal  reflux disease)   . Vitamin D deficiency   . Insomnia, unspecified   . Essential hypertension     Kipp Brood, PT, DPT, [facility] Physical Therapist with [facility]  07/24/2019 2:58 PM    [place], [place], [address] Phone: [phone]   Fax:  [phone]  Name: Courtney Crane MRN: [id number] Date of Birth: 1951-08-11

## 2019-07-28 ENCOUNTER — Other Ambulatory Visit: Payer: Self-pay

## 2019-07-28 ENCOUNTER — Ambulatory Visit (HOSPITAL_COMMUNITY): Payer: PPO

## 2019-07-28 ENCOUNTER — Encounter (HOSPITAL_COMMUNITY): Payer: Self-pay

## 2019-07-28 DIAGNOSIS — G8929 Other chronic pain: Secondary | ICD-10-CM

## 2019-07-28 DIAGNOSIS — M79605 Pain in left leg: Secondary | ICD-10-CM | POA: Diagnosis not present

## 2019-07-28 DIAGNOSIS — M79604 Pain in right leg: Secondary | ICD-10-CM

## 2019-07-28 NOTE — Therapy (Signed)
[place], [place], [address] Phone: [phone]   Fax:  [phone]  Physical Therapy Treatment  Patient Details  Name: Courtney Crane MRN: [id number] Date of Birth: 1951/08/22 Referring Provider (PT): Alda Berthold, DO   Encounter Date: 07/28/2019  PT End of Session - 07/28/19 1343    Visit Number  8    Number of Visits  12    Date for PT Re-Evaluation  08/14/19    Authorization Type  [employer] (no limit, no auth, $15 co-pay)    Authorization Time Period  07/03/19-08/14/19    PT Start Time  1345    PT Stop Time  1427    PT Time Calculation (min)  42 min    Activity Tolerance  Patient tolerated treatment well    Behavior During Therapy  [facility] for tasks assessed/performed       Past Medical History:  Diagnosis Date  . Anemia   . Anxiety   . Arthritis   . Asthma   . Colon polyps   . Complication of anesthesia    "sometimes I dont like to wake up after " ie difficult to awaken   . Depression   . Diverticulitis   . Diverticulosis   . GERD (gastroesophageal reflux disease)   . HLD (hyperlipidemia)   . HTN (hypertension)   . IBS (irritable bowel syndrome)   . Insomnia   . MRSA infection   . Osteoarthritis   . PONV (postoperative nausea and vomiting)   . RA (rheumatoid arthritis) ([place])   . Rheumatoid arthritis ([place])   . Seasonal allergies   . Shingles   . Status post dilation of esophageal narrowing   . Vitamin D deficiency     Past Surgical History:  Procedure Laterality Date  . APPENDECTOMY  1975  . bladder tack  1990  . CHOLECYSTECTOMY N/A 01/31/2017   Procedure: LAPAROSCOPIC CHOLECYSTECTOMY WITH INTRAOPERATIVE CHOLANGIOGRAM;  Surgeon: Armandina Gemma, MD;  Location: [facility];  Service: General;  Laterality: N/A;  . INCISION AND DRAINAGE PERIRECTAL ABSCESS N/A 12/02/2017   Procedure: IRRIGATION AND DEBRIDEMENT PERINEAL ABSCESS;  Surgeon: Greer Pickerel, MD;  Location: [facility];  Service:  General;  Laterality: N/A;  . MENISCUS REPAIR Right 2013  . ROTATOR CUFF REPAIR Right 2005  . TONSILLECTOMY     as a child  . TOTAL ABDOMINAL HYSTERECTOMY  1988  . TOTAL KNEE ARTHROPLASTY Left   . WRIST RECONSTRUCTION Right 2008    There were no vitals filed for this visit.  Subjective Assessment - 07/28/19 1347    Subjective  Pt reports traction last session has significantly improved her back pain. Pt reports she has been doing more stretches compared to exercises.    Pertinent History  RA, HTN    Limitations  Walking    Patient Stated Goals  to get back to walking more    Currently in Pain?  No/denies    Pain Onset  Yesterday          [facility] Adult PT Treatment/Exercise - 07/28/19 0001      Lumbar Exercises: Stretches   Figure 4 Stretch  2 reps;30 seconds    Figure 4 Stretch Limitations  BLE, supine    Other Lumbar Stretch Exercise  Seated R/L lumbar flex/lats stretch with large physioball 10x10" fwd/R/L     Other Lumbar Stretch Exercise  wall arch: 10 reps 3 sec holds at door      Lumbar Exercises: Standing  Other Standing Lumbar Exercises  deadlifts, 1# bar for form, x10 reps      Lumbar Exercises: Supine   Bridge  15 reps      Lumbar Exercises: Quadruped   Madcat/Old Horse  10 reps    Madcat/Old Horse Limitations  5 second hold      Manual Therapy   Manual Therapy  Manual Traction    Manual therapy comments  Manual complete separate than rest of tx    Manual Traction  LLE 2x1 minute long-axis traction with static hold by therapist         PT Education - 07/28/19 1432    Education Details  Updated HEP, exercise technique, manual long-axis traction of LE by spouse to alleviate pain    Person(s) Educated  Patient    Methods  Explanation;Demonstration;Handout    Comprehension  Verbalized understanding;Returned demonstration       PT Short Term Goals - 07/15/19 1238      PT SHORT TERM GOAL #1   Title  Patient will be independent with HEP, updated PRN, to  improve neurodynamic mobility and flexibility to reduce leg pain.    Time  3    Period  Weeks    Status  On-going    Target Date  07/24/19      PT SHORT TERM GOAL #2   Title  Patient will have negative SLR test bil LE to indicate improvement in neurodynamic mobility.    Time  3    Period  Weeks    Status  On-going      PT SHORT TERM GOAL #3   Title  Patient will perform 2 MWT with no increase in LE pain or deviations from normalized gait.    Time  3    Period  Weeks    Status  On-going        PT Long Term Goals - 07/15/19 1239      PT LONG TERM GOAL #1   Title  Patient will have negative slump test bil to indicate further improvement in neurodynamic tension (test this once SLR is negative)    Time  6    Period  Weeks    Status  On-going      PT LONG TERM GOAL #2   Title  Patient will improve all LE muscle groups to 5/5 to indicate significant improvement in weak hip musculature.    Time  6    Period  Weeks    Status  On-going      PT LONG TERM GOAL #3   Title  Patient will complete 6 minute walk test with no increase in LE pain and no deviations from normal gait to indicate improved tolerance to walking.    Time  6    Period  Weeks    Status  On-going            Plan - 07/28/19 1344    Clinical Impression Statement  Pt with significant pain improvement since last treatment session and has been essentially pain free since last session, but has performed more stretches versus core/glute activation exercises. Added cat/cow exercise and seated lumbar flexion forward and to R and L for added stretching throughout entire spinal column and lats. Pt performed deadlifts this date with verbal cues for form to increase glute activation and maintain flat back; pt with increased pain at end of reps. Performed long-axis traction to LLE with positive results and educated pt on how spouse could perform due  to pain alleviation. Bridges increased pt's pain in bil hamstrings causing  cessation of exercise and pain immediately resolved. Continue to progress as able.    Personal Factors and Comorbidities  Age;Comorbidity 2    Comorbidities  HTN, RA    Examination-Activity Limitations  Locomotion Level    Examination-Participation Restrictions  Community Activity    Stability/Clinical Decision Making  Evolving/Moderate complexity    Rehab Potential  Good    PT Frequency  2x / week    PT Duration  6 weeks    PT Treatment/Interventions  ADLs/Self Care Home Management;Aquatic Therapy;Electrical Stimulation;Cryotherapy;Iontophoresis 4mg /ml Dexamethasone;Moist Heat;Functional mobility training;DME Instruction;Gait training;Stair training;Therapeutic exercise;Therapeutic activities;Neuromuscular re-education;Balance training;Patient/family education;Manual techniques;Passive range of motion;Dry needling;Taping;Spinal Manipulations;Joint Manipulations    PT Next Visit Plan  Strengthening of core and glutes, progress standing for hip hinge and lumbar endurance exercises. STM to piriformis and hamstring along sciatic nerve tract, long-axis traction and segmental sciatic nerve glides.    PT Home Exercise Plan  07/07/19: segmental sciatic nerve glide and sidelying clam, bridge; 8/26: HS stretch on step, RTB for bridges to engage glutes; 9/1: TA activation with exhale and kegel, TA with bent knee marching alt; 9/8: cat/cow in quadruped    Consulted and Agree with Plan of Care  Patient       Patient will benefit from skilled therapeutic intervention in order to improve the following deficits and impairments:  Abnormal gait, Decreased balance, Decreased mobility, Difficulty walking, Decreased activity tolerance, Pain, Impaired flexibility, Increased fascial restricitons  Visit Diagnosis: Pain in left leg  Pain in right leg  Chronic midline low back pain with bilateral sciatica     Problem List Patient Active Problem List   Diagnosis Date Noted  . DOE (dyspnea on exertion) 04/09/2019   . Exertional chest pain 04/09/2019  . Claudication ([place]) 04/09/2019  . Leukocytosis 12/04/2017  . AKI (acute kidney injury) ([place]) 11/29/2017  . Rheumatoid arthritis ([place]) 11/29/2017  . Cellulitis and abscess of buttock 11/28/2017  . 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 History of shingles 06/06/2016  . Genital herpes simplex 06/06/2016  . Seasonal allergies 06/06/2016  . Seborrheic dermatitis   . Prediabetes   . Anemia   . GERD (gastroesophageal reflux disease)   . Vitamin D deficiency   . Insomnia, unspecified   . Essential hypertension      Talbot Grumbling PT, DPT 07/28/19, 2:55 PM [place], [place], [address] Phone: [phone]   Fax:  [phone]  Name: Courtney Crane MRN: [id number] Date of Birth: 1951-05-04

## 2019-08-04 ENCOUNTER — Telehealth (HOSPITAL_COMMUNITY): Payer: Self-pay | Admitting: Internal Medicine

## 2019-08-04 NOTE — Telephone Encounter (Signed)
08/04/19  pt called and asked that we cancel both of these appts this week but no reason was given

## 2019-08-05 ENCOUNTER — Ambulatory Visit (HOSPITAL_COMMUNITY): Payer: PPO

## 2019-08-06 DIAGNOSIS — M545 Low back pain: Secondary | ICD-10-CM | POA: Diagnosis not present

## 2019-08-06 DIAGNOSIS — M5416 Radiculopathy, lumbar region: Secondary | ICD-10-CM | POA: Diagnosis not present

## 2019-08-07 ENCOUNTER — Ambulatory Visit (HOSPITAL_COMMUNITY): Payer: PPO

## 2019-08-07 ENCOUNTER — Telehealth (HOSPITAL_COMMUNITY): Payer: Self-pay

## 2019-08-07 NOTE — Telephone Encounter (Signed)
pt requested to cancel all of her appts for now she stated. She has went to the doctor and they are deciding what to do next.

## 2019-08-10 DIAGNOSIS — H35033 Hypertensive retinopathy, bilateral: Secondary | ICD-10-CM | POA: Diagnosis not present

## 2019-08-10 DIAGNOSIS — H2513 Age-related nuclear cataract, bilateral: Secondary | ICD-10-CM | POA: Diagnosis not present

## 2019-08-10 DIAGNOSIS — H2512 Age-related nuclear cataract, left eye: Secondary | ICD-10-CM | POA: Diagnosis not present

## 2019-08-10 DIAGNOSIS — H25013 Cortical age-related cataract, bilateral: Secondary | ICD-10-CM | POA: Diagnosis not present

## 2019-08-11 ENCOUNTER — Ambulatory Visit (HOSPITAL_COMMUNITY): Payer: PPO

## 2019-08-14 ENCOUNTER — Encounter (HOSPITAL_COMMUNITY): Payer: PPO

## 2019-08-17 DIAGNOSIS — M0579 Rheumatoid arthritis with rheumatoid factor of multiple sites without organ or systems involvement: Secondary | ICD-10-CM | POA: Diagnosis not present

## 2019-08-17 DIAGNOSIS — Z79899 Other long term (current) drug therapy: Secondary | ICD-10-CM | POA: Diagnosis not present

## 2019-08-17 DIAGNOSIS — M545 Low back pain: Secondary | ICD-10-CM | POA: Diagnosis not present

## 2019-08-17 DIAGNOSIS — M2550 Pain in unspecified joint: Secondary | ICD-10-CM | POA: Diagnosis not present

## 2019-08-17 DIAGNOSIS — M150 Primary generalized (osteo)arthritis: Secondary | ICD-10-CM | POA: Diagnosis not present

## 2019-08-25 ENCOUNTER — Ambulatory Visit: Payer: PPO

## 2019-09-01 DIAGNOSIS — M5136 Other intervertebral disc degeneration, lumbar region: Secondary | ICD-10-CM | POA: Diagnosis not present

## 2019-09-03 DIAGNOSIS — Z23 Encounter for immunization: Secondary | ICD-10-CM | POA: Diagnosis not present

## 2019-09-04 DIAGNOSIS — L304 Erythema intertrigo: Secondary | ICD-10-CM | POA: Diagnosis not present

## 2019-09-04 DIAGNOSIS — L821 Other seborrheic keratosis: Secondary | ICD-10-CM | POA: Diagnosis not present

## 2019-09-04 DIAGNOSIS — L812 Freckles: Secondary | ICD-10-CM | POA: Diagnosis not present

## 2019-09-16 DIAGNOSIS — H25812 Combined forms of age-related cataract, left eye: Secondary | ICD-10-CM | POA: Diagnosis not present

## 2019-09-16 DIAGNOSIS — H2512 Age-related nuclear cataract, left eye: Secondary | ICD-10-CM | POA: Diagnosis not present

## 2019-09-16 DIAGNOSIS — H52222 Regular astigmatism, left eye: Secondary | ICD-10-CM | POA: Diagnosis not present

## 2019-09-17 DIAGNOSIS — M7072 Other bursitis of hip, left hip: Secondary | ICD-10-CM | POA: Insufficient documentation

## 2019-09-22 DIAGNOSIS — H2511 Age-related nuclear cataract, right eye: Secondary | ICD-10-CM | POA: Diagnosis not present

## 2019-09-22 DIAGNOSIS — H25011 Cortical age-related cataract, right eye: Secondary | ICD-10-CM | POA: Diagnosis not present

## 2019-09-23 DIAGNOSIS — I10 Essential (primary) hypertension: Secondary | ICD-10-CM | POA: Diagnosis not present

## 2019-09-23 DIAGNOSIS — M1990 Unspecified osteoarthritis, unspecified site: Secondary | ICD-10-CM | POA: Diagnosis not present

## 2019-09-23 DIAGNOSIS — M79605 Pain in left leg: Secondary | ICD-10-CM | POA: Diagnosis not present

## 2019-09-23 DIAGNOSIS — M8580 Other specified disorders of bone density and structure, unspecified site: Secondary | ICD-10-CM | POA: Diagnosis not present

## 2019-09-23 DIAGNOSIS — M069 Rheumatoid arthritis, unspecified: Secondary | ICD-10-CM | POA: Diagnosis not present

## 2019-09-23 DIAGNOSIS — M899 Disorder of bone, unspecified: Secondary | ICD-10-CM | POA: Insufficient documentation

## 2019-09-23 DIAGNOSIS — K219 Gastro-esophageal reflux disease without esophagitis: Secondary | ICD-10-CM | POA: Diagnosis not present

## 2019-09-23 DIAGNOSIS — R0609 Other forms of dyspnea: Secondary | ICD-10-CM | POA: Diagnosis not present

## 2019-09-23 DIAGNOSIS — F330 Major depressive disorder, recurrent, mild: Secondary | ICD-10-CM | POA: Diagnosis not present

## 2019-09-23 DIAGNOSIS — F419 Anxiety disorder, unspecified: Secondary | ICD-10-CM | POA: Diagnosis not present

## 2019-09-25 DIAGNOSIS — Z20828 Contact with and (suspected) exposure to other viral communicable diseases: Secondary | ICD-10-CM | POA: Diagnosis not present

## 2019-09-30 DIAGNOSIS — H2511 Age-related nuclear cataract, right eye: Secondary | ICD-10-CM | POA: Diagnosis not present

## 2019-09-30 DIAGNOSIS — H25811 Combined forms of age-related cataract, right eye: Secondary | ICD-10-CM | POA: Diagnosis not present

## 2019-09-30 DIAGNOSIS — H25011 Cortical age-related cataract, right eye: Secondary | ICD-10-CM | POA: Diagnosis not present

## 2019-10-02 ENCOUNTER — Telehealth: Payer: PPO | Admitting: Neurology

## 2019-10-08 DIAGNOSIS — M7072 Other bursitis of hip, left hip: Secondary | ICD-10-CM | POA: Diagnosis not present

## 2019-10-22 DIAGNOSIS — M545 Low back pain: Secondary | ICD-10-CM | POA: Diagnosis not present

## 2019-10-22 DIAGNOSIS — M5416 Radiculopathy, lumbar region: Secondary | ICD-10-CM | POA: Diagnosis not present

## 2019-11-23 DIAGNOSIS — Z79899 Other long term (current) drug therapy: Secondary | ICD-10-CM | POA: Diagnosis not present

## 2019-11-23 DIAGNOSIS — E663 Overweight: Secondary | ICD-10-CM | POA: Diagnosis not present

## 2019-11-23 DIAGNOSIS — M0579 Rheumatoid arthritis with rheumatoid factor of multiple sites without organ or systems involvement: Secondary | ICD-10-CM | POA: Diagnosis not present

## 2019-11-23 DIAGNOSIS — Z6826 Body mass index (BMI) 26.0-26.9, adult: Secondary | ICD-10-CM | POA: Diagnosis not present

## 2019-11-23 DIAGNOSIS — M2550 Pain in unspecified joint: Secondary | ICD-10-CM | POA: Diagnosis not present

## 2019-11-23 DIAGNOSIS — M150 Primary generalized (osteo)arthritis: Secondary | ICD-10-CM | POA: Diagnosis not present

## 2019-11-27 DIAGNOSIS — M7072 Other bursitis of hip, left hip: Secondary | ICD-10-CM | POA: Diagnosis not present

## 2019-12-05 DIAGNOSIS — R52 Pain, unspecified: Secondary | ICD-10-CM | POA: Diagnosis not present

## 2019-12-21 DIAGNOSIS — M0579 Rheumatoid arthritis with rheumatoid factor of multiple sites without organ or systems involvement: Secondary | ICD-10-CM | POA: Diagnosis not present

## 2019-12-31 DIAGNOSIS — N3943 Post-void dribbling: Secondary | ICD-10-CM | POA: Diagnosis not present

## 2019-12-31 DIAGNOSIS — M545 Low back pain: Secondary | ICD-10-CM | POA: Diagnosis not present

## 2019-12-31 DIAGNOSIS — M6281 Muscle weakness (generalized): Secondary | ICD-10-CM | POA: Diagnosis not present

## 2019-12-31 DIAGNOSIS — M79652 Pain in left thigh: Secondary | ICD-10-CM | POA: Diagnosis not present

## 2019-12-31 DIAGNOSIS — R3915 Urgency of urination: Secondary | ICD-10-CM | POA: Diagnosis not present

## 2019-12-31 DIAGNOSIS — K5909 Other constipation: Secondary | ICD-10-CM | POA: Diagnosis not present

## 2019-12-31 DIAGNOSIS — N393 Stress incontinence (female) (male): Secondary | ICD-10-CM | POA: Diagnosis not present

## 2019-12-31 DIAGNOSIS — M62838 Other muscle spasm: Secondary | ICD-10-CM | POA: Diagnosis not present

## 2020-01-01 DIAGNOSIS — N393 Stress incontinence (female) (male): Secondary | ICD-10-CM | POA: Diagnosis not present

## 2020-01-01 DIAGNOSIS — K5909 Other constipation: Secondary | ICD-10-CM | POA: Diagnosis not present

## 2020-01-01 DIAGNOSIS — M6281 Muscle weakness (generalized): Secondary | ICD-10-CM | POA: Diagnosis not present

## 2020-01-01 DIAGNOSIS — M62838 Other muscle spasm: Secondary | ICD-10-CM | POA: Diagnosis not present

## 2020-01-01 DIAGNOSIS — N3943 Post-void dribbling: Secondary | ICD-10-CM | POA: Diagnosis not present

## 2020-01-01 DIAGNOSIS — M79652 Pain in left thigh: Secondary | ICD-10-CM | POA: Diagnosis not present

## 2020-01-01 DIAGNOSIS — R3915 Urgency of urination: Secondary | ICD-10-CM | POA: Diagnosis not present

## 2020-01-01 DIAGNOSIS — M545 Low back pain: Secondary | ICD-10-CM | POA: Diagnosis not present

## 2020-01-06 DIAGNOSIS — N393 Stress incontinence (female) (male): Secondary | ICD-10-CM | POA: Diagnosis not present

## 2020-01-06 DIAGNOSIS — M6281 Muscle weakness (generalized): Secondary | ICD-10-CM | POA: Diagnosis not present

## 2020-01-06 DIAGNOSIS — R3915 Urgency of urination: Secondary | ICD-10-CM | POA: Diagnosis not present

## 2020-01-06 DIAGNOSIS — N3943 Post-void dribbling: Secondary | ICD-10-CM | POA: Diagnosis not present

## 2020-01-06 DIAGNOSIS — M79652 Pain in left thigh: Secondary | ICD-10-CM | POA: Diagnosis not present

## 2020-01-06 DIAGNOSIS — M545 Low back pain: Secondary | ICD-10-CM | POA: Diagnosis not present

## 2020-01-06 DIAGNOSIS — K5909 Other constipation: Secondary | ICD-10-CM | POA: Diagnosis not present

## 2020-01-06 DIAGNOSIS — M62838 Other muscle spasm: Secondary | ICD-10-CM | POA: Diagnosis not present

## 2020-01-08 DIAGNOSIS — M79652 Pain in left thigh: Secondary | ICD-10-CM | POA: Diagnosis not present

## 2020-01-08 DIAGNOSIS — K5909 Other constipation: Secondary | ICD-10-CM | POA: Diagnosis not present

## 2020-01-08 DIAGNOSIS — N3943 Post-void dribbling: Secondary | ICD-10-CM | POA: Diagnosis not present

## 2020-01-08 DIAGNOSIS — M62838 Other muscle spasm: Secondary | ICD-10-CM | POA: Diagnosis not present

## 2020-01-08 DIAGNOSIS — M6281 Muscle weakness (generalized): Secondary | ICD-10-CM | POA: Diagnosis not present

## 2020-01-08 DIAGNOSIS — N393 Stress incontinence (female) (male): Secondary | ICD-10-CM | POA: Diagnosis not present

## 2020-01-08 DIAGNOSIS — R3915 Urgency of urination: Secondary | ICD-10-CM | POA: Diagnosis not present

## 2020-01-08 DIAGNOSIS — M545 Low back pain: Secondary | ICD-10-CM | POA: Diagnosis not present

## 2020-01-11 DIAGNOSIS — R3915 Urgency of urination: Secondary | ICD-10-CM | POA: Diagnosis not present

## 2020-01-11 DIAGNOSIS — N3943 Post-void dribbling: Secondary | ICD-10-CM | POA: Diagnosis not present

## 2020-01-11 DIAGNOSIS — K5909 Other constipation: Secondary | ICD-10-CM | POA: Diagnosis not present

## 2020-01-11 DIAGNOSIS — M6281 Muscle weakness (generalized): Secondary | ICD-10-CM | POA: Diagnosis not present

## 2020-01-11 DIAGNOSIS — M79652 Pain in left thigh: Secondary | ICD-10-CM | POA: Diagnosis not present

## 2020-01-11 DIAGNOSIS — N393 Stress incontinence (female) (male): Secondary | ICD-10-CM | POA: Diagnosis not present

## 2020-01-11 DIAGNOSIS — M62838 Other muscle spasm: Secondary | ICD-10-CM | POA: Diagnosis not present

## 2020-01-11 DIAGNOSIS — M545 Low back pain: Secondary | ICD-10-CM | POA: Diagnosis not present

## 2020-01-13 DIAGNOSIS — M62838 Other muscle spasm: Secondary | ICD-10-CM | POA: Diagnosis not present

## 2020-01-13 DIAGNOSIS — N3943 Post-void dribbling: Secondary | ICD-10-CM | POA: Diagnosis not present

## 2020-01-13 DIAGNOSIS — R3915 Urgency of urination: Secondary | ICD-10-CM | POA: Diagnosis not present

## 2020-01-13 DIAGNOSIS — M6281 Muscle weakness (generalized): Secondary | ICD-10-CM | POA: Diagnosis not present

## 2020-01-13 DIAGNOSIS — K5909 Other constipation: Secondary | ICD-10-CM | POA: Diagnosis not present

## 2020-01-13 DIAGNOSIS — M545 Low back pain: Secondary | ICD-10-CM | POA: Diagnosis not present

## 2020-01-13 DIAGNOSIS — N393 Stress incontinence (female) (male): Secondary | ICD-10-CM | POA: Diagnosis not present

## 2020-01-13 DIAGNOSIS — M79652 Pain in left thigh: Secondary | ICD-10-CM | POA: Diagnosis not present

## 2020-01-18 ENCOUNTER — Encounter: Payer: Self-pay | Admitting: Nurse Practitioner

## 2020-01-18 ENCOUNTER — Ambulatory Visit: Payer: PPO | Admitting: Nurse Practitioner

## 2020-01-18 ENCOUNTER — Other Ambulatory Visit: Payer: Self-pay

## 2020-01-18 VITALS — BP 116/60 | HR 84 | Temp 98.2°F | Ht 62.5 in | Wt 152.4 lb

## 2020-01-18 DIAGNOSIS — K219 Gastro-esophageal reflux disease without esophagitis: Secondary | ICD-10-CM

## 2020-01-18 DIAGNOSIS — R1310 Dysphagia, unspecified: Secondary | ICD-10-CM

## 2020-01-18 DIAGNOSIS — Z01818 Encounter for other preprocedural examination: Secondary | ICD-10-CM | POA: Diagnosis not present

## 2020-01-18 MED ORDER — OMEPRAZOLE 20 MG PO CPDR: 20.0000 mg | DELAYED_RELEASE_CAPSULE | Freq: Two times a day (BID) | ORAL | 1 refills | Status: DC

## 2020-01-18 NOTE — Patient Instructions (Addendum)
If you are age 69 or older, your body mass index should be between 23-30. Your Body mass index is 27.43 kg/m. If this is out of the aforementioned range listed, please consider follow up with your Primary Care Provider.  If you are age 69 or younger, your body mass index should be between 19-25. Your Body mass index is 27.43 kg/m. If this is out of the aformentioned range listed, please consider follow up with your Primary Care Provider.   You have been scheduled for an endoscopy. Please follow written instructions given to you at your visit today. If you use inhalers (even only as needed), please bring them with you on the day of your procedure. We have sent the following medications to your pharmacy for you to pick up at your convenience:  Omeprazole 20mg  1 tablet by mouth twice a day.  Please obtain your labs and imaging from your Rheumatologist. Call our office if your symptoms worsen  Due to recent changes in healthcare laws, you may see the results of your imaging and laboratory studies on MyChart before your provider has had a chance to review them.  We understand that in some cases there may be results that are confusing or concerning to you. Not all laboratory results come back in the same time frame and the provider may be waiting for multiple results in order to interpret others.  Please give [place] 48 hours in order for your provider to thoroughly review all the results before contacting the office for clarification of your results.   Thank you for choosing [facility], CRNP

## 2020-01-18 NOTE — Progress Notes (Signed)
01/19/2020 [name] [id number] [birth year]   Chief Complaint:  Reflux, food is getting stuck in the esophagus    History of Present Illness: Courtney Crane is a 69 year old female with a past medical history of anxiety, depression, asthma, hypertension, rheumatoid arthritis, diverticulitis x 5 episodes last occurred in 2018, colon polyps.  S/P laparoscopic cholecystectomy by Dr. Harlow Asa 01/2017 with a normal IOC. History of post operative N/V. She complains of having  Heartburn/indigestion which has worsened over the past 2 months. She sometimes has pain between the shoulder blades. No N/V. She is having dysphagia as well. She is having episodes when food gets stuck in the upper mid esophagus, approximately 4 to 5 episodes over the past 2 months. Foods such as chicken or pork get stuck. Drinking water makes her symptoms worse, water won't go down and  comes back out. If  she sits upright and waits 10 minutes the food will pass.  The most recent episode of good getting stuck occurred 3 weeks ago. Persistent belching and gas per the rectum. She stopped eating tomatoes for the past 1 1/2 weeks with minor improvement. She does not eat after 8pm. She takes ASA 81mg  daily and Meloxicam once daily due to having a torn left hamstring left hamstring and chronic aches. She has lost 10lbs over the past 3 to 4 months. Decreased appetite. Eating smaller portions. She underwent an EGD with esophageal dilatation 15 years ago when she lived in [place]. She is taking Omeprazole 20mg  once daily.  History of diverticulitis, last occurred in 2018 treated with Cipro and Flagyl with smoldering symptoms so a course of Augmentin was added.  She is passing a normal solid stool once daily. No rectal bleeding or melena. Mother with history of colon polyps. She underwent a colonoscopy 02/13/2016 by Dr. Ardis Hughs which showed multiple small and large diverticula throughout the entire colon. No polyps. 10 year recall.   History  of elevated LFTs when admitted to the hospital in 1/ 2019 with a perineal abscess MRSA infection. She received numerous antibiotics. Her LFTs normalized without further hepatology evaluation. 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   She is on Methotrexate for rheumatoid arthritis followed by Dr. Berna Bue. She reports having labs to check her LFTs every 3 to 4 months which have been "stable".  I will request a copy of her most recent labs, hepatic panel for further review.   Current Outpatient Medications on File Prior to Visit  Medication Sig Dispense Refill  . acetaminophen (TYLENOL) 500 MG tablet Take 500 mg by mouth as needed (pain).    Marland Kitchen amLODipine (NORVASC) 10 MG tablet Take 10 mg by mouth daily.     . Ascorbic Acid (VITAMIN C PO) Take 600 mg by mouth daily.    Marland Kitchen aspirin 81 MG chewable tablet Baby Aspirin 81 mg chewable tablet   1 tablet every day by oral route.    . Cholecalciferol (VITAMIN D3) 10000 units TABS Take 1 tablet by mouth once a week.    . Coral Calcium 1000 (390 Ca) MG TABS Take by mouth.    . fexofenadine (ALLEGRA) 180 MG tablet Take 90 mg by mouth 2 (two) times a day.     . folic acid (FOLVITE) 1 MG tablet Take 2 mg by mouth daily.    . hydrochlorothiazide (HYDRODIURIL) 25 MG tablet Take 25 mg by mouth daily.    Marland Kitchen losartan (  COZAAR) 100 MG tablet Take 100 mg by mouth daily.    Marland Kitchen MELATONIN PO Take 1 tablet by mouth at bedtime as needed.     . meloxicam (MOBIC) 15 MG tablet Take 15 mg by mouth daily.    . methotrexate (RHEUMATREX) 2.5 MG tablet Take 25 mg by mouth once a week. Caution:Chemotherapy. Protect from light.  Takes 8 tablets weekly     . Multiple Vitamin (MULTIVITAMIN) tablet Take 1 tablet by mouth daily.    Marland Kitchen NALTREXONE HCL PO Take 4.5 mg by mouth daily.    . NON FORMULARY Place under the tongue at bedtime. CBD Oil- nightly    . Omega-3 Fatty Acids (FISH OIL) 1200 MG CAPS Take 1 capsule by mouth 2  (two) times daily.     . polyethylene glycol (MIRALAX / GLYCOLAX) 17 g packet Take 17 g by mouth daily as needed. Taking three times weekly    . PSYLLIUM PO Take 1 packet by mouth daily. Fiber    . valACYclovir (VALTREX) 500 MG tablet Take 500 mg by mouth as needed.      No current facility-administered medications on file prior to visit.   Allergies  Allergen Reactions  . Demerol [Meperidine] Nausea Only  . Nickel   . Tape Rash    Current Medications, Allergies, Past Medical History, Past Surgical History, Family History and Social History were reviewed in [employer] record.   Physical Exam: BP 116/60 (BP Location: Left Arm, Patient Position: Sitting, Cuff Size: Normal)   Pulse 84   Temp 98.2 F (36.8 C)   Ht 5' 2.5" (1.588 m) Comment: height measured without shoes  Wt 152 lb 6 oz (69.1 kg)   BMI 27.43 kg/m  General: Well developed 69 year old female in no acute distress. Head: Normocephalic and atraumatic. Eyes:  No scleral icterus. Conjunctiva pink . Ears: Normal auditory acuity. Mouth: Dentition intact. No ulcers or lesions.  Lungs: Clear throughout to auscultation. Heart: Regular rate and rhythm, no murmur. Abdomen: Soft, nontender and nondistended. No masses or hepatomegaly. Normal bowel sounds x 4 quadrants. Laparoscopic scars and horizontal lower abdominal scar intact.  Rectal: Deferred.  Musculoskeletal: Symmetrical with no gross deformities. Extremities: No edema. Neurological: Alert oriented x 4. No focal deficits.  Psychological:  Alert and cooperative. Normal mood and affect  Assessment and Recommendations: 1. GERD with dysphagia. -EGD with possible esophageal dilatation benefits and risks discussed including risk with sedation, risk of bleeding, perforation and infection  -Omeprazole 20mg  one po bid -Patient to call our office if her symptoms worsen -Further follow up to be determined after EGD completed   2. History of left sided  diverticulitis   3. Colon cancer screening. Family history of colon polyps. Next colonoscopy due 01/2026, earlier if symptoms warrant/if diverticulitis recurs  4. Fatty liver -Request copy of most recent labs including hepatic panel from Rheumatologist  5. Rheumatoid arthritis on MTX

## 2020-01-19 DIAGNOSIS — M79652 Pain in left thigh: Secondary | ICD-10-CM | POA: Diagnosis not present

## 2020-01-19 DIAGNOSIS — M6281 Muscle weakness (generalized): Secondary | ICD-10-CM | POA: Diagnosis not present

## 2020-01-19 DIAGNOSIS — R1310 Dysphagia, unspecified: Secondary | ICD-10-CM | POA: Insufficient documentation

## 2020-01-19 DIAGNOSIS — N393 Stress incontinence (female) (male): Secondary | ICD-10-CM | POA: Diagnosis not present

## 2020-01-19 DIAGNOSIS — K5909 Other constipation: Secondary | ICD-10-CM | POA: Diagnosis not present

## 2020-01-19 DIAGNOSIS — M62838 Other muscle spasm: Secondary | ICD-10-CM | POA: Diagnosis not present

## 2020-01-19 DIAGNOSIS — M545 Low back pain: Secondary | ICD-10-CM | POA: Diagnosis not present

## 2020-01-19 DIAGNOSIS — R3915 Urgency of urination: Secondary | ICD-10-CM | POA: Diagnosis not present

## 2020-01-19 DIAGNOSIS — N3943 Post-void dribbling: Secondary | ICD-10-CM | POA: Diagnosis not present

## 2020-01-19 HISTORY — DX: Dysphagia, unspecified: R13.10

## 2020-01-19 NOTE — Progress Notes (Signed)
I agree with the above note, plan 

## 2020-01-21 DIAGNOSIS — N3943 Post-void dribbling: Secondary | ICD-10-CM | POA: Diagnosis not present

## 2020-01-21 DIAGNOSIS — N393 Stress incontinence (female) (male): Secondary | ICD-10-CM | POA: Diagnosis not present

## 2020-01-21 DIAGNOSIS — M545 Low back pain: Secondary | ICD-10-CM | POA: Diagnosis not present

## 2020-01-21 DIAGNOSIS — M79652 Pain in left thigh: Secondary | ICD-10-CM | POA: Diagnosis not present

## 2020-01-21 DIAGNOSIS — M62838 Other muscle spasm: Secondary | ICD-10-CM | POA: Diagnosis not present

## 2020-01-21 DIAGNOSIS — M6281 Muscle weakness (generalized): Secondary | ICD-10-CM | POA: Diagnosis not present

## 2020-01-21 DIAGNOSIS — K5909 Other constipation: Secondary | ICD-10-CM | POA: Diagnosis not present

## 2020-01-21 DIAGNOSIS — R3915 Urgency of urination: Secondary | ICD-10-CM | POA: Diagnosis not present

## 2020-01-24 DIAGNOSIS — K219 Gastro-esophageal reflux disease without esophagitis: Secondary | ICD-10-CM

## 2020-01-24 DIAGNOSIS — R112 Nausea with vomiting, unspecified: Secondary | ICD-10-CM

## 2020-01-25 ENCOUNTER — Encounter: Payer: Self-pay | Admitting: Gastroenterology

## 2020-01-25 DIAGNOSIS — M62838 Other muscle spasm: Secondary | ICD-10-CM | POA: Diagnosis not present

## 2020-01-25 DIAGNOSIS — N393 Stress incontinence (female) (male): Secondary | ICD-10-CM | POA: Diagnosis not present

## 2020-01-25 DIAGNOSIS — K5909 Other constipation: Secondary | ICD-10-CM | POA: Diagnosis not present

## 2020-01-25 DIAGNOSIS — N3943 Post-void dribbling: Secondary | ICD-10-CM | POA: Diagnosis not present

## 2020-01-25 DIAGNOSIS — M79652 Pain in left thigh: Secondary | ICD-10-CM | POA: Diagnosis not present

## 2020-01-25 DIAGNOSIS — M6281 Muscle weakness (generalized): Secondary | ICD-10-CM | POA: Diagnosis not present

## 2020-01-25 DIAGNOSIS — R3915 Urgency of urination: Secondary | ICD-10-CM | POA: Diagnosis not present

## 2020-01-25 DIAGNOSIS — M545 Low back pain: Secondary | ICD-10-CM | POA: Diagnosis not present

## 2020-01-28 DIAGNOSIS — N393 Stress incontinence (female) (male): Secondary | ICD-10-CM | POA: Diagnosis not present

## 2020-01-28 DIAGNOSIS — M6281 Muscle weakness (generalized): Secondary | ICD-10-CM | POA: Diagnosis not present

## 2020-01-28 DIAGNOSIS — N3943 Post-void dribbling: Secondary | ICD-10-CM | POA: Diagnosis not present

## 2020-01-28 DIAGNOSIS — R3915 Urgency of urination: Secondary | ICD-10-CM | POA: Diagnosis not present

## 2020-01-28 DIAGNOSIS — K5909 Other constipation: Secondary | ICD-10-CM | POA: Diagnosis not present

## 2020-01-28 DIAGNOSIS — M545 Low back pain: Secondary | ICD-10-CM | POA: Diagnosis not present

## 2020-01-28 DIAGNOSIS — M62838 Other muscle spasm: Secondary | ICD-10-CM | POA: Diagnosis not present

## 2020-01-28 DIAGNOSIS — M79652 Pain in left thigh: Secondary | ICD-10-CM | POA: Diagnosis not present

## 2020-01-28 MED ORDER — ONDANSETRON 4 MG PO TBDP: 4.0000 mg | ORAL_TABLET | Freq: Four times a day (QID) | ORAL | 1 refills | Status: DC | PRN

## 2020-01-29 MED ORDER — AMOXICILLIN-POT CLAVULANATE 875-125 MG PO TABS: 1.0000 | ORAL_TABLET | Freq: Two times a day (BID) | ORAL | 0 refills | Status: AC

## 2020-02-01 ENCOUNTER — Telehealth: Payer: Self-pay | Admitting: Nurse Practitioner

## 2020-02-01 DIAGNOSIS — N393 Stress incontinence (female) (male): Secondary | ICD-10-CM | POA: Diagnosis not present

## 2020-02-01 DIAGNOSIS — M79652 Pain in left thigh: Secondary | ICD-10-CM | POA: Diagnosis not present

## 2020-02-01 DIAGNOSIS — R3915 Urgency of urination: Secondary | ICD-10-CM | POA: Diagnosis not present

## 2020-02-01 DIAGNOSIS — K5909 Other constipation: Secondary | ICD-10-CM | POA: Diagnosis not present

## 2020-02-01 DIAGNOSIS — M62838 Other muscle spasm: Secondary | ICD-10-CM | POA: Diagnosis not present

## 2020-02-01 DIAGNOSIS — N3943 Post-void dribbling: Secondary | ICD-10-CM | POA: Diagnosis not present

## 2020-02-01 DIAGNOSIS — M6281 Muscle weakness (generalized): Secondary | ICD-10-CM | POA: Diagnosis not present

## 2020-02-01 DIAGNOSIS — M545 Low back pain: Secondary | ICD-10-CM | POA: Diagnosis not present

## 2020-02-01 NOTE — Telephone Encounter (Signed)
Called and spoke with patient-patient reports she is still having pain in the LLQ/tender (the same pain that I have had-not severe) Patient wanted to report-pain has improved since starting antibiotics;   Denies:rectal bleeding/rectal pain/fever/chills/no new symptoms; Patient advised to call back to the office at [phone] should questions/concerns arise;  Patient verbalized understanding of information/instructions;

## 2020-02-01 NOTE — Telephone Encounter (Signed)
Noted, appreciate update

## 2020-02-04 ENCOUNTER — Other Ambulatory Visit: Payer: Self-pay | Admitting: Gastroenterology

## 2020-02-04 ENCOUNTER — Ambulatory Visit (INDEPENDENT_AMBULATORY_CARE_PROVIDER_SITE_OTHER): Payer: PPO

## 2020-02-04 DIAGNOSIS — M62838 Other muscle spasm: Secondary | ICD-10-CM | POA: Diagnosis not present

## 2020-02-04 DIAGNOSIS — M545 Low back pain: Secondary | ICD-10-CM | POA: Diagnosis not present

## 2020-02-04 DIAGNOSIS — R3915 Urgency of urination: Secondary | ICD-10-CM | POA: Diagnosis not present

## 2020-02-04 DIAGNOSIS — K5909 Other constipation: Secondary | ICD-10-CM | POA: Diagnosis not present

## 2020-02-04 DIAGNOSIS — Z1159 Encounter for screening for other viral diseases: Secondary | ICD-10-CM | POA: Diagnosis not present

## 2020-02-04 DIAGNOSIS — M6281 Muscle weakness (generalized): Secondary | ICD-10-CM | POA: Diagnosis not present

## 2020-02-04 DIAGNOSIS — N3943 Post-void dribbling: Secondary | ICD-10-CM | POA: Diagnosis not present

## 2020-02-04 DIAGNOSIS — N393 Stress incontinence (female) (male): Secondary | ICD-10-CM | POA: Diagnosis not present

## 2020-02-04 DIAGNOSIS — M79652 Pain in left thigh: Secondary | ICD-10-CM | POA: Diagnosis not present

## 2020-02-05 LAB — SARS CORONAVIRUS 2 (TAT 6-24 HRS): SARS Coronavirus 2: NEGATIVE

## 2020-02-08 ENCOUNTER — Encounter: Payer: Self-pay | Admitting: Gastroenterology

## 2020-02-08 ENCOUNTER — Ambulatory Visit (AMBULATORY_SURGERY_CENTER): Payer: PPO | Admitting: Gastroenterology

## 2020-02-08 ENCOUNTER — Other Ambulatory Visit: Payer: Self-pay

## 2020-02-08 VITALS — BP 98/47 | HR 56 | Temp 97.5°F | Resp 10 | Ht 62.5 in | Wt 152.0 lb

## 2020-02-08 DIAGNOSIS — K219 Gastro-esophageal reflux disease without esophagitis: Secondary | ICD-10-CM

## 2020-02-08 DIAGNOSIS — R12 Heartburn: Secondary | ICD-10-CM | POA: Diagnosis not present

## 2020-02-08 DIAGNOSIS — K2950 Unspecified chronic gastritis without bleeding: Secondary | ICD-10-CM

## 2020-02-08 DIAGNOSIS — R1310 Dysphagia, unspecified: Secondary | ICD-10-CM

## 2020-02-08 MED ORDER — OMEPRAZOLE 40 MG PO CPDR: 40.0000 mg | DELAYED_RELEASE_CAPSULE | Freq: Every day | ORAL | 11 refills | Status: DC

## 2020-02-08 MED ORDER — SODIUM CHLORIDE 0.9 % IV SOLN: 500.0000 mL | Freq: Once | INTRAVENOUS | Status: DC

## 2020-02-08 NOTE — Progress Notes (Signed)
Called to room to assist during endoscopic procedure.  Patient ID and intended procedure confirmed with present staff. Received instructions for my participation in the procedure from the performing physician.  

## 2020-02-08 NOTE — Patient Instructions (Signed)
Pick up new medication today.   YOU HAD AN ENDOSCOPIC PROCEDURE TODAY AT [facility]:   Refer to the procedure report that was given to you for any specific questions about what was found during the examination.  If the procedure report does not answer your questions, please call your gastroenterologist to clarify.  If you requested that your care partner not be given the details of your procedure findings, then the procedure report has been included in a sealed envelope for you to review at your convenience later.  YOU SHOULD EXPECT: Some feelings of bloating in the abdomen. Passage of more gas than usual.  Walking can help get rid of the air that was put into your GI tract during the procedure and reduce the bloating. If you had a lower endoscopy (such as a colonoscopy or flexible sigmoidoscopy) you may notice spotting of blood in your stool or on the toilet paper. If you underwent a bowel prep for your procedure, you may not have a normal bowel movement for a few days.  Please Note:  You might notice some irritation and congestion in your nose or some drainage.  This is from the oxygen used during your procedure.  There is no need for concern and it should clear up in a day or so.  SYMPTOMS TO REPORT IMMEDIATELY:     Following upper endoscopy (EGD)  Vomiting of blood or coffee ground material  New chest pain or pain under the shoulder blades  Painful or persistently difficult swallowing  New shortness of breath  Fever of 100F or higher  Black, tarry-looking stools  For urgent or emergent issues, a gastroenterologist can be reached at any hour by calling [phone]. Do not use MyChart messaging for urgent concerns.    DIET:  We do recommend a small meal at first, but then you may proceed to your regular diet.  Drink plenty of fluids but you should avoid alcoholic beverages for 24 hours.  ACTIVITY:  You should plan to take it easy for the rest of today and you  should NOT DRIVE or use heavy machinery until tomorrow (because of the sedation medicines used during the test).    FOLLOW UP: Our staff will call the number listed on your records 48-72 hours following your procedure to check on you and address any questions or concerns that you may have regarding the information given to you following your procedure. If we do not reach you, we will leave a message.  We will attempt to reach you two times.  During this call, we will ask if you have developed any symptoms of COVID 19. If you develop any symptoms (ie: fever, flu-like symptoms, shortness of breath, cough etc.) before then, please call [phone].  If you test positive for Covid 19 in the 2 weeks post procedure, please call and report this information to [place].    If any biopsies were taken you will be contacted by phone or by letter within the next 1-3 weeks.  Please call us at [phone] if you have not heard about the biopsies in 3 weeks.    SIGNATURES/CONFIDENTIALITY: You and/or your care partner have signed paperwork which will be entered into your electronic medical record.  These signatures attest to the fact that that the information above on your After Visit Summary has been reviewed and is understood.  Full responsibility of the confidentiality of this discharge information lies with you and/or your care-partner.

## 2020-02-08 NOTE — Progress Notes (Signed)
pt tolerated well. VSS. awake and to recovery. Report given to RN.  

## 2020-02-08 NOTE — Progress Notes (Signed)
Temp by LC, vitals by KA  Pt's states no medical or surgical changes since previsit or office visit.

## 2020-02-08 NOTE — Op Note (Signed)
[facility] Patient Name: Courtney Crane Procedure Date: 02/08/2020 3:28 PM MRN: [id number] Endoscopist: Milus Banister , MD Age: 69 Referring MD:  Date of Birth: 1950-12-02 Gender: Female Account #: [id number] Procedure:                Upper GI endoscopy Indications:              Dysphagia, Heartburn Medicines:                Monitored Anesthesia Care Procedure:                Pre-Anesthesia Assessment:                           - Prior to the procedure, a History and Physical                            was performed, and patient medications and                            allergies were reviewed. The patient's tolerance of                            previous anesthesia was also reviewed. The risks                            and benefits of the procedure and the sedation                            options and risks were discussed with the patient.                            All questions were answered, and informed consent                            was obtained. Prior Anticoagulants: The patient has                            taken no previous anticoagulant or antiplatelet                            agents. ASA Grade Assessment: II - A patient with                            mild systemic disease. After reviewing the risks                            and benefits, the patient was deemed in                            satisfactory condition to undergo the procedure.                           After obtaining informed consent, the endoscope was  passed under direct vision. Throughout the                            procedure, the patient's blood pressure, pulse, and                            oxygen saturations were monitored continuously. The                            Endoscope was introduced through the mouth, and                            advanced to the second part of duodenum. The upper                            GI endoscopy was accomplished  without difficulty.                            The patient tolerated the procedure well. Scope In: Scope Out: Findings:                 Mild inflammation characterized by erythema and                            granularity was found in the gastric body and in                            the gastric antrum. Biopsies were taken with a cold                            forceps for histology.                           The exam was otherwise without abnormality. Complications:            No immediate complications. Estimated blood loss:                            None. Estimated Blood Loss:     Estimated blood loss: none. Impression:               - Mild, non-specific gastritis. Biopsied to check                            for H. pylori.                           - The examination was otherwise normal. Recommendation:           - Patient has a contact number available for                            emergencies. The signs and symptoms of potential                            delayed complications were discussed with the  patient. Return to normal activities tomorrow.                            Written discharge instructions were provided to the                            patient.                           - Resume previous diet.                           - Please stop the twice daily OTC omeprazole pills.                            Instead start new prescription called in today:                            omeprazole 40mg  pill, one pill 20-30 min before                            breakfast meal daily, disp 30 with 11 refills.                           - Await pathology results. Milus Banister, MD 02/08/2020 3:38:01 PM This report has been signed electronically.

## 2020-02-09 DIAGNOSIS — N393 Stress incontinence (female) (male): Secondary | ICD-10-CM | POA: Diagnosis not present

## 2020-02-09 DIAGNOSIS — R3915 Urgency of urination: Secondary | ICD-10-CM | POA: Diagnosis not present

## 2020-02-09 DIAGNOSIS — M79652 Pain in left thigh: Secondary | ICD-10-CM | POA: Diagnosis not present

## 2020-02-09 DIAGNOSIS — M62838 Other muscle spasm: Secondary | ICD-10-CM | POA: Diagnosis not present

## 2020-02-09 DIAGNOSIS — N3943 Post-void dribbling: Secondary | ICD-10-CM | POA: Diagnosis not present

## 2020-02-09 DIAGNOSIS — K5909 Other constipation: Secondary | ICD-10-CM | POA: Diagnosis not present

## 2020-02-09 DIAGNOSIS — M545 Low back pain: Secondary | ICD-10-CM | POA: Diagnosis not present

## 2020-02-09 DIAGNOSIS — M6281 Muscle weakness (generalized): Secondary | ICD-10-CM | POA: Diagnosis not present

## 2020-02-10 ENCOUNTER — Telehealth: Payer: Self-pay

## 2020-02-10 ENCOUNTER — Telehealth: Payer: Self-pay | Admitting: *Deleted

## 2020-02-10 NOTE — Telephone Encounter (Signed)
1. Have you developed a fever since your procedure? no  2.   Have you had an respiratory symptoms (SOB or cough) since your procedure? no  3.   Have you tested positive for COVID 19 since your procedure no  4.   Have you had any family members/close contacts diagnosed with the COVID 19 since your procedure?  no   If yes to any of these questions please route to Joylene John, RN and Erenest Rasher, RN Follow up Call-  Call back number 02/08/2020  Post procedure Call Back phone  # [phone]  Permission to leave phone message Yes  Some recent data might be hidden     Patient questions:  Do you have a fever, pain , or abdominal swelling? No. Pain Score  0 *  Have you tolerated food without any problems? Yes.    Have you been able to return to your normal activities? Yes.    Do you have any questions about your discharge instructions: Diet   No. Medications  No. Follow up visit  No.  Do you have questions or concerns about your Care? No.  Actions: * If pain score is 4 or above: No action needed, pain <4.

## 2020-02-10 NOTE — Telephone Encounter (Signed)
Left message on answering machine. 

## 2020-02-11 DIAGNOSIS — R3915 Urgency of urination: Secondary | ICD-10-CM | POA: Diagnosis not present

## 2020-02-11 DIAGNOSIS — M6281 Muscle weakness (generalized): Secondary | ICD-10-CM | POA: Diagnosis not present

## 2020-02-11 DIAGNOSIS — M79652 Pain in left thigh: Secondary | ICD-10-CM | POA: Diagnosis not present

## 2020-02-11 DIAGNOSIS — N393 Stress incontinence (female) (male): Secondary | ICD-10-CM | POA: Diagnosis not present

## 2020-02-11 DIAGNOSIS — K5909 Other constipation: Secondary | ICD-10-CM | POA: Diagnosis not present

## 2020-02-11 DIAGNOSIS — M62838 Other muscle spasm: Secondary | ICD-10-CM | POA: Diagnosis not present

## 2020-02-11 DIAGNOSIS — N3943 Post-void dribbling: Secondary | ICD-10-CM | POA: Diagnosis not present

## 2020-02-11 DIAGNOSIS — M545 Low back pain: Secondary | ICD-10-CM | POA: Diagnosis not present

## 2020-02-12 ENCOUNTER — Other Ambulatory Visit: Payer: Self-pay | Admitting: Nurse Practitioner

## 2020-02-12 ENCOUNTER — Encounter: Payer: Self-pay | Admitting: Gastroenterology

## 2020-02-12 DIAGNOSIS — R1310 Dysphagia, unspecified: Secondary | ICD-10-CM

## 2020-02-12 DIAGNOSIS — K219 Gastro-esophageal reflux disease without esophagitis: Secondary | ICD-10-CM

## 2020-02-16 DIAGNOSIS — M6281 Muscle weakness (generalized): Secondary | ICD-10-CM | POA: Diagnosis not present

## 2020-02-16 DIAGNOSIS — M62838 Other muscle spasm: Secondary | ICD-10-CM | POA: Diagnosis not present

## 2020-02-16 DIAGNOSIS — K5909 Other constipation: Secondary | ICD-10-CM | POA: Diagnosis not present

## 2020-02-16 DIAGNOSIS — N3943 Post-void dribbling: Secondary | ICD-10-CM | POA: Diagnosis not present

## 2020-02-16 DIAGNOSIS — M79652 Pain in left thigh: Secondary | ICD-10-CM | POA: Diagnosis not present

## 2020-02-16 DIAGNOSIS — R3915 Urgency of urination: Secondary | ICD-10-CM | POA: Diagnosis not present

## 2020-02-16 DIAGNOSIS — M545 Low back pain: Secondary | ICD-10-CM | POA: Diagnosis not present

## 2020-02-16 DIAGNOSIS — N393 Stress incontinence (female) (male): Secondary | ICD-10-CM | POA: Diagnosis not present

## 2020-02-18 DIAGNOSIS — N3943 Post-void dribbling: Secondary | ICD-10-CM | POA: Diagnosis not present

## 2020-02-18 DIAGNOSIS — M545 Low back pain: Secondary | ICD-10-CM | POA: Diagnosis not present

## 2020-02-18 DIAGNOSIS — M6281 Muscle weakness (generalized): Secondary | ICD-10-CM | POA: Diagnosis not present

## 2020-02-18 DIAGNOSIS — N393 Stress incontinence (female) (male): Secondary | ICD-10-CM | POA: Diagnosis not present

## 2020-02-18 DIAGNOSIS — K5909 Other constipation: Secondary | ICD-10-CM | POA: Diagnosis not present

## 2020-02-18 DIAGNOSIS — M79652 Pain in left thigh: Secondary | ICD-10-CM | POA: Diagnosis not present

## 2020-02-18 DIAGNOSIS — M62838 Other muscle spasm: Secondary | ICD-10-CM | POA: Diagnosis not present

## 2020-02-18 DIAGNOSIS — R3915 Urgency of urination: Secondary | ICD-10-CM | POA: Diagnosis not present

## 2020-02-22 DIAGNOSIS — M0579 Rheumatoid arthritis with rheumatoid factor of multiple sites without organ or systems involvement: Secondary | ICD-10-CM | POA: Diagnosis not present

## 2020-02-22 DIAGNOSIS — M150 Primary generalized (osteo)arthritis: Secondary | ICD-10-CM | POA: Diagnosis not present

## 2020-02-22 DIAGNOSIS — K760 Fatty (change of) liver, not elsewhere classified: Secondary | ICD-10-CM | POA: Diagnosis not present

## 2020-02-22 DIAGNOSIS — M2550 Pain in unspecified joint: Secondary | ICD-10-CM | POA: Diagnosis not present

## 2020-02-22 DIAGNOSIS — E663 Overweight: Secondary | ICD-10-CM | POA: Diagnosis not present

## 2020-02-22 DIAGNOSIS — R5383 Other fatigue: Secondary | ICD-10-CM | POA: Diagnosis not present

## 2020-02-22 DIAGNOSIS — Z6827 Body mass index (BMI) 27.0-27.9, adult: Secondary | ICD-10-CM | POA: Diagnosis not present

## 2020-03-08 ENCOUNTER — Telehealth: Payer: Self-pay | Admitting: Gastroenterology

## 2020-03-08 NOTE — Telephone Encounter (Signed)
FYI  Dr Ardis Hughs the pt states she is doing very well on omeprazole. The pt has been advised that I received her message and she will call back if she has further problems.

## 2020-03-09 ENCOUNTER — Other Ambulatory Visit: Payer: Self-pay | Admitting: Nurse Practitioner

## 2020-03-09 DIAGNOSIS — K219 Gastro-esophageal reflux disease without esophagitis: Secondary | ICD-10-CM

## 2020-03-09 DIAGNOSIS — R1310 Dysphagia, unspecified: Secondary | ICD-10-CM

## 2020-04-25 DIAGNOSIS — Z961 Presence of intraocular lens: Secondary | ICD-10-CM | POA: Diagnosis not present

## 2020-04-25 DIAGNOSIS — H35033 Hypertensive retinopathy, bilateral: Secondary | ICD-10-CM | POA: Diagnosis not present

## 2020-04-25 DIAGNOSIS — H04123 Dry eye syndrome of bilateral lacrimal glands: Secondary | ICD-10-CM | POA: Diagnosis not present

## 2020-04-25 DIAGNOSIS — H26491 Other secondary cataract, right eye: Secondary | ICD-10-CM | POA: Diagnosis not present

## 2020-05-04 DIAGNOSIS — E7849 Other hyperlipidemia: Secondary | ICD-10-CM | POA: Diagnosis not present

## 2020-05-04 DIAGNOSIS — M859 Disorder of bone density and structure, unspecified: Secondary | ICD-10-CM | POA: Diagnosis not present

## 2020-05-11 DIAGNOSIS — K219 Gastro-esophageal reflux disease without esophagitis: Secondary | ICD-10-CM | POA: Diagnosis not present

## 2020-05-11 DIAGNOSIS — E559 Vitamin D deficiency, unspecified: Secondary | ICD-10-CM | POA: Diagnosis not present

## 2020-05-11 DIAGNOSIS — F330 Major depressive disorder, recurrent, mild: Secondary | ICD-10-CM | POA: Diagnosis not present

## 2020-05-11 DIAGNOSIS — Z0000 Encounter for general adult medical examination without abnormal findings: Secondary | ICD-10-CM | POA: Diagnosis not present

## 2020-05-11 DIAGNOSIS — R82998 Other abnormal findings in urine: Secondary | ICD-10-CM | POA: Diagnosis not present

## 2020-05-11 DIAGNOSIS — R7309 Other abnormal glucose: Secondary | ICD-10-CM | POA: Diagnosis not present

## 2020-05-11 DIAGNOSIS — M6281 Muscle weakness (generalized): Secondary | ICD-10-CM | POA: Insufficient documentation

## 2020-05-11 DIAGNOSIS — E785 Hyperlipidemia, unspecified: Secondary | ICD-10-CM | POA: Diagnosis not present

## 2020-05-11 DIAGNOSIS — M069 Rheumatoid arthritis, unspecified: Secondary | ICD-10-CM | POA: Diagnosis not present

## 2020-05-11 DIAGNOSIS — I10 Essential (primary) hypertension: Secondary | ICD-10-CM | POA: Diagnosis not present

## 2020-05-11 DIAGNOSIS — Z7689 Persons encountering health services in other specified circumstances: Secondary | ICD-10-CM | POA: Diagnosis not present

## 2020-05-11 DIAGNOSIS — M8580 Other specified disorders of bone density and structure, unspecified site: Secondary | ICD-10-CM | POA: Diagnosis not present

## 2020-05-11 DIAGNOSIS — G4700 Insomnia, unspecified: Secondary | ICD-10-CM | POA: Diagnosis not present

## 2020-05-11 DIAGNOSIS — F419 Anxiety disorder, unspecified: Secondary | ICD-10-CM | POA: Diagnosis not present

## 2020-05-12 DIAGNOSIS — K921 Melena: Secondary | ICD-10-CM | POA: Diagnosis not present

## 2020-05-24 DIAGNOSIS — K921 Melena: Secondary | ICD-10-CM | POA: Diagnosis not present

## 2020-05-26 DIAGNOSIS — M0579 Rheumatoid arthritis with rheumatoid factor of multiple sites without organ or systems involvement: Secondary | ICD-10-CM | POA: Diagnosis not present

## 2020-05-26 DIAGNOSIS — E663 Overweight: Secondary | ICD-10-CM | POA: Diagnosis not present

## 2020-05-26 DIAGNOSIS — Z6827 Body mass index (BMI) 27.0-27.9, adult: Secondary | ICD-10-CM | POA: Diagnosis not present

## 2020-05-26 DIAGNOSIS — M150 Primary generalized (osteo)arthritis: Secondary | ICD-10-CM | POA: Diagnosis not present

## 2020-05-26 DIAGNOSIS — Z79899 Other long term (current) drug therapy: Secondary | ICD-10-CM | POA: Diagnosis not present

## 2020-05-26 DIAGNOSIS — R5383 Other fatigue: Secondary | ICD-10-CM | POA: Diagnosis not present

## 2020-05-26 DIAGNOSIS — M2550 Pain in unspecified joint: Secondary | ICD-10-CM | POA: Diagnosis not present

## 2020-05-31 DIAGNOSIS — Z1231 Encounter for screening mammogram for malignant neoplasm of breast: Secondary | ICD-10-CM | POA: Diagnosis not present

## 2020-05-31 DIAGNOSIS — Z6826 Body mass index (BMI) 26.0-26.9, adult: Secondary | ICD-10-CM | POA: Diagnosis not present

## 2020-05-31 DIAGNOSIS — Z01419 Encounter for gynecological examination (general) (routine) without abnormal findings: Secondary | ICD-10-CM | POA: Diagnosis not present

## 2020-06-29 DIAGNOSIS — H04123 Dry eye syndrome of bilateral lacrimal glands: Secondary | ICD-10-CM | POA: Diagnosis not present

## 2020-07-07 DIAGNOSIS — M0579 Rheumatoid arthritis with rheumatoid factor of multiple sites without organ or systems involvement: Secondary | ICD-10-CM | POA: Diagnosis not present

## 2020-07-14 ENCOUNTER — Ambulatory Visit: Payer: PPO | Attending: Internal Medicine

## 2020-07-14 DIAGNOSIS — Z23 Encounter for immunization: Secondary | ICD-10-CM

## 2020-07-14 NOTE — Progress Notes (Signed)
   Covid-19 Vaccination Clinic  Name:  Courtney Crane    MRN: [id number] DOB: Dec 11, 1950  07/14/2020  [name] was observed post Covid-19 immunization for 15 minutes without incident. She was provided with Vaccine Information Sheet and instruction to access the V-Safe system.   [name] was instructed to call 911 with any severe reactions post vaccine: Marland Kitchen Difficulty breathing  . Swelling of face and throat  . A fast heartbeat  . A bad rash all over body  . Dizziness and weakness

## 2020-07-19 DIAGNOSIS — M791 Myalgia, unspecified site: Secondary | ICD-10-CM | POA: Diagnosis not present

## 2020-07-19 DIAGNOSIS — R253 Fasciculation: Secondary | ICD-10-CM | POA: Diagnosis not present

## 2020-07-19 DIAGNOSIS — M6289 Other specified disorders of muscle: Secondary | ICD-10-CM | POA: Diagnosis not present

## 2020-07-19 DIAGNOSIS — R252 Cramp and spasm: Secondary | ICD-10-CM | POA: Diagnosis not present

## 2020-07-21 DIAGNOSIS — M0579 Rheumatoid arthritis with rheumatoid factor of multiple sites without organ or systems involvement: Secondary | ICD-10-CM | POA: Diagnosis not present

## 2020-07-29 DIAGNOSIS — M79605 Pain in left leg: Secondary | ICD-10-CM | POA: Diagnosis not present

## 2020-08-03 ENCOUNTER — Encounter: Payer: Self-pay | Admitting: Gastroenterology

## 2020-08-03 ENCOUNTER — Ambulatory Visit (INDEPENDENT_AMBULATORY_CARE_PROVIDER_SITE_OTHER): Payer: PPO | Admitting: Gastroenterology

## 2020-08-03 VITALS — BP 126/60 | HR 80 | Ht 62.5 in | Wt 150.2 lb

## 2020-08-03 DIAGNOSIS — M0579 Rheumatoid arthritis with rheumatoid factor of multiple sites without organ or systems involvement: Secondary | ICD-10-CM | POA: Diagnosis not present

## 2020-08-03 DIAGNOSIS — Z79899 Other long term (current) drug therapy: Secondary | ICD-10-CM | POA: Diagnosis not present

## 2020-08-03 DIAGNOSIS — R195 Other fecal abnormalities: Secondary | ICD-10-CM

## 2020-08-03 DIAGNOSIS — E663 Overweight: Secondary | ICD-10-CM | POA: Diagnosis not present

## 2020-08-03 DIAGNOSIS — Z6827 Body mass index (BMI) 27.0-27.9, adult: Secondary | ICD-10-CM | POA: Diagnosis not present

## 2020-08-03 DIAGNOSIS — M2550 Pain in unspecified joint: Secondary | ICD-10-CM | POA: Diagnosis not present

## 2020-08-03 DIAGNOSIS — M150 Primary generalized (osteo)arthritis: Secondary | ICD-10-CM | POA: Diagnosis not present

## 2020-08-03 MED ORDER — SUPREP BOWEL PREP KIT 17.5-3.13-1.6 GM/177ML PO SOLN: 1.0000 | ORAL | 0 refills | Status: DC

## 2020-08-03 NOTE — Patient Instructions (Signed)
If you are age 69 or older, your body mass index should be between 23-30. Your Body mass index is 27.04 kg/m. If this is out of the aforementioned range listed, please consider follow up with your Primary Care Provider.  If you are age 65 or younger, your body mass index should be between 19-25. Your Body mass index is 27.04 kg/m. If this is out of the aformentioned range listed, please consider follow up with your Primary Care Provider.   You have been scheduled for a colonoscopy. Please follow written instructions given to you at your visit today.  Please pick up your prep supplies at the pharmacy within the next 1-3 days. If you use inhalers (even only as needed), please bring them with you on the day of your procedure.  Due to recent changes in healthcare laws, you may see the results of your imaging and laboratory studies on MyChart before your provider has had a chance to review them.  We understand that in some cases there may be results that are confusing or concerning to you. Not all laboratory results come back in the same time frame and the provider may be waiting for multiple results in order to interpret others.  Please give [place] 48 hours in order for your provider to thoroughly review all the results before contacting the office for clarification of your results.   Thank you for entrusting me with your care and choosing [facility].  Dr Ardis Hughs

## 2020-08-03 NOTE — Progress Notes (Signed)
Review of pertinent gastrointestinal problems: 1.Recurrent acute diverticulitis: established care Dr. Ardis Hughs 2017; previouslyat least 3 attacks of diverticulitis. The pain is usually LLQ, lasting 5-10 days, cipro/flagyl, has had CT proof in past. Most recent attack 2015 while movingto [place]. Put on antibiotics and her symptoms resolved. Usually gets 'low grade fevers.'  She has had 4-5 attacks of pain.  Never hospitalized. 2. Routine risk for colon cancer: Colonoscopy 01/2016 Dr. Ardis Hughs: pan-diverticulosis, no polyps. Recall at 10 years recommended. 3. Symptomatic gallstones: s/p lap chole 01/2017, normal IOC 4.  Mild gastritis, EGD March 2021 showed mild gastritis, biopsies showed no sign of infection or cancer.  Once daily omeprazole helped her symptoms, she was greatly improved.   HPI: This is a very pleasant 69 year old woman whom I last saw at the time of an EGD.  See those results summarized above.  Since then she had a routine physical and tells me that her stool was checked for Hemoccult status.  She was told by her primary care physician that this was done just to be safe even though she was not really due for colon cancer screening.  The stool testing was positive for microscopic blood.  She then had 3 samples checked and they were positive as well.  She never sees blood in her stool.  Colon cancer does not run in her family.  She does have chronic bowel issues but no significant changes from that.  She takes MiraLAX 3 times a week and on that regimen her bowels worked quite well for her.  ROS: complete GI ROS as described in HPI, all other review negative.  Constitutional:  No unintentional weight loss   Past Medical History:  Diagnosis Date  . Anemia   . Anxiety   . Arthritis   . Asthma   . Colon polyps   . Complication of anesthesia    "sometimes I dont like to wake up after " ie difficult to awaken   . Depression   . Diverticulitis   . Diverticulosis   . GERD  (gastroesophageal reflux disease)   . HLD (hyperlipidemia)   . HTN (hypertension)   . IBS (irritable bowel syndrome)   . Insomnia   . MRSA infection   . Osteoarthritis   . Osteopenia   . PONV (postoperative nausea and vomiting)   . RA (rheumatoid arthritis) ([place])   . Rheumatoid arthritis ([place])   . Seasonal allergies   . Shingles   . Status post dilation of esophageal narrowing   . Vitamin D deficiency     Past Surgical History:  Procedure Laterality Date  . APPENDECTOMY  1975  . bladder tack  1990  . CHOLECYSTECTOMY N/A 01/31/2017   Procedure: LAPAROSCOPIC CHOLECYSTECTOMY WITH INTRAOPERATIVE CHOLANGIOGRAM;  Surgeon: Armandina Gemma, MD;  Location: [facility];  Service: General;  Laterality: N/A;  . INCISION AND DRAINAGE PERIRECTAL ABSCESS N/A 12/02/2017   Procedure: IRRIGATION AND DEBRIDEMENT PERINEAL ABSCESS;  Surgeon: Greer Pickerel, MD;  Location: [facility];  Service: General;  Laterality: N/A;  . MENISCUS REPAIR Right 2013  . ROTATOR CUFF REPAIR Right 2005  . TONSILLECTOMY     as a child  . TOTAL ABDOMINAL HYSTERECTOMY  1988  . TOTAL KNEE ARTHROPLASTY Left   . WRIST RECONSTRUCTION Right 2008    Current Outpatient Medications  Medication Sig Dispense Refill  . acetaminophen (TYLENOL) 500 MG tablet Take 500 mg by mouth as needed (pain).    Marland Kitchen amLODipine (NORVASC) 10 MG tablet Take 10 mg by mouth daily.     Marland Kitchen  Ascorbic Acid (VITAMIN C PO) Take 600 mg by mouth daily.    Marland Kitchen aspirin EC 81 MG tablet Take 81 mg by mouth daily. Swallow whole.    . Cholecalciferol (VITAMIN D-3) 125 MCG (5000 UT) TABS Take 1 tablet by mouth daily.    . Coral Calcium 1000 (390 Ca) MG TABS Take by mouth.    . fexofenadine (ALLEGRA) 180 MG tablet Take 90 mg by mouth 2 (two) times a day.     . folic acid (FOLVITE) 1 MG tablet Take 2 mg by mouth daily.    . hydrochlorothiazide (HYDRODIURIL) 25 MG tablet Take 25 mg by mouth daily.    Marland Kitchen inFLIXimab-abda (RENFLEXIS) 100 MG SOLR Inject into the vein. Dosage By weight every  8 weeks    . losartan (COZAAR) 100 MG tablet Take 100 mg by mouth daily.    Marland Kitchen MELATONIN PO Take 1 tablet by mouth at bedtime as needed.     . meloxicam (MOBIC) 15 MG tablet Take 15 mg by mouth daily.    . Multiple Vitamin (MULTIVITAMIN) tablet Take 1 tablet by mouth daily.    . NON FORMULARY Place under the tongue at bedtime. CBD Oil- nightly    . Omega-3 Fatty Acids (FISH OIL) 1200 MG CAPS Take 1 capsule by mouth 2 (two) times daily.     Marland Kitchen omeprazole (PRILOSEC) 40 MG capsule Take 1 capsule (40 mg total) by mouth daily. Take one pill 20-30 minutes before breakfast meal. 30 capsule 11  . polyethylene glycol (MIRALAX / GLYCOLAX) 17 g packet Take 17 g by mouth daily as needed. Taking three times weekly    . PSYLLIUM PO Take 1 packet by mouth daily. Fiber    . valACYclovir (VALTREX) 500 MG tablet Take 500 mg by mouth as needed.      No current facility-administered medications for this visit.    Allergies as of 08/03/2020 - Review Complete 08/03/2020  Allergen Reaction Noted  . Demerol [meperidine] Nausea Only 01/16/2016  . Nickel  01/11/2018  . Tape Rash     Family History  Problem Relation Age of Onset  . Colon polyps Mother   . Diabetes Mother   . Heart disease Mother   . Kidney cancer Mother   . Arthritis Mother   . Hypertension Mother   . Hyperlipidemia Mother   . Colon polyps Son   . CAD Brother   . Esophageal cancer Brother   . Dementia Father   . Colon cancer Neg Hx   . Stomach cancer Neg Hx   . Rectal cancer Neg Hx     Social History   Socioeconomic History  . Marital status: Married    Spouse name: Not on file  . Number of children: 2  . Years of education: Not on file  . Highest education level: Not on file  Occupational History  . Occupation: retired  Tobacco Use  . Smoking status: Never Smoker  . Smokeless tobacco: Never Used  Vaping Use  . Vaping Use: Never used  Substance and Sexual Activity  . Alcohol use: Yes    Alcohol/week: 7.0 standard drinks     Types: 7 Standard drinks or equivalent per week    Comment: wine daily  . Drug use: No  . Sexual activity: Not on file  Other Topics Concern  . Not on file  Social History Narrative   Retired from [employer]   Lives at home with husband ; one level home   Right handed  Highest level of education:  Some college   Social Determinants of Health   Financial Resource Strain:   . Difficulty of Paying Living Expenses: Not on file  Food Insecurity:   . Worried About Charity fundraiser in the Last Year: Not on file  . Ran Out of Food in the Last Year: Not on file  Transportation Needs:   . Lack of Transportation (Medical): Not on file  . Lack of Transportation (Non-Medical): Not on file  Physical Activity:   . Days of Exercise per Week: Not on file  . Minutes of Exercise per Session: Not on file  Stress:   . Feeling of Stress : Not on file  Social Connections:   . Frequency of Communication with Friends and Family: Not on file  . Frequency of Social Gatherings with Friends and Family: Not on file  . Attends Religious Services: Not on file  . Active Member of Clubs or Organizations: Not on file  . Attends Archivist Meetings: Not on file  . Marital Status: Not on file  Intimate Partner Violence:   . Fear of Current or Ex-Partner: Not on file  . Emotionally Abused: Not on file  . Physically Abused: Not on file  . Sexually Abused: Not on file     Physical Exam: BP 126/60 (BP Location: Left Arm, Patient Position: Sitting, Cuff Size: Normal)   Pulse 80   Ht 5' 2.5" (1.588 m)   Wt 150 lb 4 oz (68.2 kg)   BMI 27.04 kg/m  Constitutional: generally well-appearing Psychiatric: alert and oriented x3 Abdomen: soft, nontender, nondistended, no obvious ascites, no peritoneal signs, normal bowel sounds No peripheral edema noted in lower extremities  Assessment and plan: 69 y.o. female with Hemoccult positive stool  She is being over screened for colon cancer however now we  have these test results I recommended repeat colonoscopy at her soonest convenience to make sure that there is nothing serious going on such as an advanced neoplasm.  I see no reason for any further blood tests or imaging studies prior to that.  I also explained to her that she can politely declined colon cancer screening tests in the future since we are taking care of that for her by colonoscopy.  Please see the "Patient Instructions" section for addition details about the plan.  Owens Loffler, MD [facility] 08/03/2020, 10:03 AM   Total time on date of encounter was 30 minutes (this included time spent preparing to see the patient reviewing records; obtaining and/or reviewing separately obtained history; performing a medically appropriate exam and/or evaluation; counseling and educating the patient and family if present; ordering medications, tests or procedures if applicable; and documenting clinical information in the health record).

## 2020-08-09 DIAGNOSIS — R253 Fasciculation: Secondary | ICD-10-CM | POA: Diagnosis not present

## 2020-08-09 DIAGNOSIS — R252 Cramp and spasm: Secondary | ICD-10-CM | POA: Diagnosis not present

## 2020-08-09 DIAGNOSIS — M6289 Other specified disorders of muscle: Secondary | ICD-10-CM | POA: Diagnosis not present

## 2020-08-09 DIAGNOSIS — M791 Myalgia, unspecified site: Secondary | ICD-10-CM | POA: Diagnosis not present

## 2020-08-15 DIAGNOSIS — S76311D Strain of muscle, fascia and tendon of the posterior muscle group at thigh level, right thigh, subsequent encounter: Secondary | ICD-10-CM | POA: Insufficient documentation

## 2020-08-15 DIAGNOSIS — R102 Pelvic and perineal pain: Secondary | ICD-10-CM | POA: Diagnosis not present

## 2020-08-15 DIAGNOSIS — S76312A Strain of muscle, fascia and tendon of the posterior muscle group at thigh level, left thigh, initial encounter: Secondary | ICD-10-CM | POA: Diagnosis not present

## 2020-08-18 DIAGNOSIS — M0579 Rheumatoid arthritis with rheumatoid factor of multiple sites without organ or systems involvement: Secondary | ICD-10-CM | POA: Diagnosis not present

## 2020-08-19 DIAGNOSIS — M79605 Pain in left leg: Secondary | ICD-10-CM | POA: Diagnosis not present

## 2020-08-22 DIAGNOSIS — M79605 Pain in left leg: Secondary | ICD-10-CM | POA: Insufficient documentation

## 2020-08-24 DIAGNOSIS — M79605 Pain in left leg: Secondary | ICD-10-CM | POA: Diagnosis not present

## 2020-08-27 DIAGNOSIS — Z23 Encounter for immunization: Secondary | ICD-10-CM | POA: Diagnosis not present

## 2020-08-29 DIAGNOSIS — M79605 Pain in left leg: Secondary | ICD-10-CM | POA: Diagnosis not present

## 2020-09-01 DIAGNOSIS — M79605 Pain in left leg: Secondary | ICD-10-CM | POA: Diagnosis not present

## 2020-09-06 DIAGNOSIS — L821 Other seborrheic keratosis: Secondary | ICD-10-CM | POA: Diagnosis not present

## 2020-09-06 DIAGNOSIS — L812 Freckles: Secondary | ICD-10-CM | POA: Diagnosis not present

## 2020-09-06 DIAGNOSIS — M79605 Pain in left leg: Secondary | ICD-10-CM | POA: Diagnosis not present

## 2020-09-06 DIAGNOSIS — L820 Inflamed seborrheic keratosis: Secondary | ICD-10-CM | POA: Diagnosis not present

## 2020-09-06 DIAGNOSIS — L309 Dermatitis, unspecified: Secondary | ICD-10-CM | POA: Diagnosis not present

## 2020-09-08 DIAGNOSIS — M79605 Pain in left leg: Secondary | ICD-10-CM | POA: Diagnosis not present

## 2020-09-09 DIAGNOSIS — R5383 Other fatigue: Secondary | ICD-10-CM | POA: Diagnosis not present

## 2020-09-09 DIAGNOSIS — M6289 Other specified disorders of muscle: Secondary | ICD-10-CM | POA: Diagnosis not present

## 2020-09-09 DIAGNOSIS — M5416 Radiculopathy, lumbar region: Secondary | ICD-10-CM | POA: Diagnosis not present

## 2020-09-12 DIAGNOSIS — M79605 Pain in left leg: Secondary | ICD-10-CM | POA: Diagnosis not present

## 2020-09-14 ENCOUNTER — Encounter: Payer: Self-pay | Admitting: Gastroenterology

## 2020-09-15 DIAGNOSIS — S76311D Strain of muscle, fascia and tendon of the posterior muscle group at thigh level, right thigh, subsequent encounter: Secondary | ICD-10-CM | POA: Diagnosis not present

## 2020-09-15 DIAGNOSIS — M7072 Other bursitis of hip, left hip: Secondary | ICD-10-CM | POA: Diagnosis not present

## 2020-09-20 DIAGNOSIS — M79605 Pain in left leg: Secondary | ICD-10-CM | POA: Diagnosis not present

## 2020-09-22 DIAGNOSIS — M79605 Pain in left leg: Secondary | ICD-10-CM | POA: Diagnosis not present

## 2020-09-23 ENCOUNTER — Encounter: Payer: PPO | Admitting: Gastroenterology

## 2020-09-25 ENCOUNTER — Encounter: Payer: Self-pay | Admitting: Certified Registered Nurse Anesthetist

## 2020-09-26 ENCOUNTER — Encounter: Payer: Self-pay | Admitting: Gastroenterology

## 2020-09-26 ENCOUNTER — Ambulatory Visit (AMBULATORY_SURGERY_CENTER): Payer: PPO | Admitting: Gastroenterology

## 2020-09-26 ENCOUNTER — Other Ambulatory Visit: Payer: Self-pay

## 2020-09-26 VITALS — BP 127/50 | HR 53 | Temp 98.4°F | Resp 8 | Ht 62.0 in | Wt 150.0 lb

## 2020-09-26 DIAGNOSIS — R195 Other fecal abnormalities: Secondary | ICD-10-CM | POA: Diagnosis not present

## 2020-09-26 DIAGNOSIS — K648 Other hemorrhoids: Secondary | ICD-10-CM

## 2020-09-26 DIAGNOSIS — K5730 Diverticulosis of large intestine without perforation or abscess without bleeding: Secondary | ICD-10-CM

## 2020-09-26 DIAGNOSIS — M069 Rheumatoid arthritis, unspecified: Secondary | ICD-10-CM | POA: Diagnosis not present

## 2020-09-26 DIAGNOSIS — J45909 Unspecified asthma, uncomplicated: Secondary | ICD-10-CM | POA: Diagnosis not present

## 2020-09-26 MED ORDER — SODIUM CHLORIDE 0.9 % IV SOLN: 500.0000 mL | Freq: Once | INTRAVENOUS | Status: DC

## 2020-09-26 NOTE — Progress Notes (Signed)
Medical history reviewed with patient. VS assessed by C.W

## 2020-09-26 NOTE — Op Note (Signed)
[name]: Courtney Crane Procedure Date: 09/26/2020 1:20 PM MRN: [id number] Endoscopist: Milus Banister , MD Age: 69 Referring MD:  Date of Birth: [birth year] Gender: Female Account #: [id number] Procedure:                Colonoscopy Indications:              Heme positive stool recently; Routine risk for                            colon cancer:Colonoscopy 01/2016 Dr. Ardis Hughs:                            pan-diverticulosis, no polyps. Recall at 10 years                            recommended. Medicines:                Monitored Anesthesia Care Procedure:                Pre-Anesthesia Assessment:                           - Prior to the procedure, a History and Physical                            was performed, and patient medications and                            allergies were reviewed. The patient's tolerance of                            previous anesthesia was also reviewed. The risks                            and benefits of the procedure and the sedation                            options and risks were discussed with the patient.                            All questions were answered, and informed consent                            was obtained. Prior Anticoagulants: The patient has                            taken no previous anticoagulant or antiplatelet                            agents. ASA Grade Assessment: II - A patient with                            mild systemic disease. After reviewing the risks  and benefits, the patient was deemed in                            satisfactory condition to undergo the procedure.                           After obtaining informed consent, the colonoscope                            was passed under direct vision. Throughout the                            procedure, the patient's blood pressure, pulse, and                            oxygen saturations were monitored continuously. The                             Colonoscope was introduced through the anus and                            advanced to the the cecum, identified by                            appendiceal orifice and ileocecal valve. The                            colonoscopy was performed without difficulty. The                            patient tolerated the procedure well. The quality                            of the bowel preparation was good. The ileocecal                            valve, appendiceal orifice, and rectum were                            photographed. Scope In: 1:31:53 PM Scope Out: 1:42:50 PM Scope Withdrawal Time: 0 hours 7 minutes 42 seconds  Total Procedure Duration: 0 hours 10 minutes 57 seconds  Findings:                 Multiple small and large-mouthed diverticula were                            found in the left colon.                           Internal hemorrhoids were found. The hemorrhoids                            were small.  The exam was otherwise without abnormality on                            direct and retroflexion views. Complications:            No immediate complications. Estimated blood loss:                            None. Estimated Blood Loss:     Estimated blood loss: none. Impression:               - Diverticulosis in the left colon.                           - Internal hemorrhoids.                           - The examination was otherwise normal on direct                            and retroflexion views.                           - No polyps or cancers. Recommendation:           - Patient has a contact number available for                            emergencies. The signs and symptoms of potential                            delayed complications were discussed with the                            patient. Return to normal activities tomorrow.                            Written discharge instructions were provided to the                             patient.                           - Resume previous diet.                           - Continue present medications.                           - Repeat colonoscopy in 10 years for screening                            purposes. There is no need for colon cancer                            screening by any method (including stool testing)  prior to then. Milus Banister, MD 09/26/2020 1:45:13 PM This report has been signed electronically.

## 2020-09-26 NOTE — Progress Notes (Signed)
Report given to PACU, vss 

## 2020-09-26 NOTE — Patient Instructions (Signed)
Handouts provided on diverticulosis and hemorrhoids.   Repeat colonoscopy in 10 years screening purposes. There is no need for colon cancer screening by any method (including stool testing) prior to then.    YOU HAD AN ENDOSCOPIC PROCEDURE TODAY AT [facility]:   Refer to the procedure report that was given to you for any specific questions about what was found during the examination.  If the procedure report does not answer your questions, please call your gastroenterologist to clarify.  If you requested that your care partner not be given the details of your procedure findings, then the procedure report has been included in a sealed envelope for you to review at your convenience later.  YOU SHOULD EXPECT: Some feelings of bloating in the abdomen. Passage of more gas than usual.  Walking can help get rid of the air that was put into your GI tract during the procedure and reduce the bloating. If you had a lower endoscopy (such as a colonoscopy or flexible sigmoidoscopy) you may notice spotting of blood in your stool or on the toilet paper. If you underwent a bowel prep for your procedure, you may not have a normal bowel movement for a few days.  Please Note:  You might notice some irritation and congestion in your nose or some drainage.  This is from the oxygen used during your procedure.  There is no need for concern and it should clear up in a day or so.  SYMPTOMS TO REPORT IMMEDIATELY:   Following lower endoscopy (colonoscopy or flexible sigmoidoscopy):  Excessive amounts of blood in the stool  Significant tenderness or worsening of abdominal pains  Swelling of the abdomen that is new, acute  Fever of 100F or higher  For urgent or emergent issues, a gastroenterologist can be reached at any hour by calling [phone]. Do not use MyChart messaging for urgent concerns.    DIET:  We do recommend a small meal at first, but then you may proceed to your regular diet.  Drink  plenty of fluids but you should avoid alcoholic beverages for 24 hours.  ACTIVITY:  You should plan to take it easy for the rest of today and you should NOT DRIVE or use heavy machinery until tomorrow (because of the sedation medicines used during the test).    FOLLOW UP: Our staff will call the number listed on your records 48-72 hours following your procedure to check on you and address any questions or concerns that you may have regarding the information given to you following your procedure. If we do not reach you, we will leave a message.  We will attempt to reach you two times.  During this call, we will ask if you have developed any symptoms of COVID 19. If you develop any symptoms (ie: fever, flu-like symptoms, shortness of breath, cough etc.) before then, please call [phone].  If you test positive for Covid 19 in the 2 weeks post procedure, please call and report this information to [place].    If any biopsies were taken you will be contacted by phone or by letter within the next 1-3 weeks.  Please call us at [phone] if you have not heard about the biopsies in 3 weeks.    SIGNATURES/CONFIDENTIALITY: You and/or your care partner have signed paperwork which will be entered into your electronic medical record.  These signatures attest to the fact that that the information above on your After Visit Summary has been reviewed and is understood.  Full responsibility of the confidentiality of this discharge information lies with you and/or your care-partner.

## 2020-09-27 DIAGNOSIS — M79605 Pain in left leg: Secondary | ICD-10-CM | POA: Diagnosis not present

## 2020-09-28 ENCOUNTER — Telehealth: Payer: Self-pay | Admitting: *Deleted

## 2020-09-28 NOTE — Telephone Encounter (Signed)
°  Follow up Call-  Call back number 09/26/2020 02/08/2020  Post procedure Call Back phone  # [phone]  Permission to leave phone message Yes Yes  Some recent data might be hidden     Patient questions:  Do you have a fever, pain , or abdominal swelling? No. Pain Score  0 *  Have you tolerated food without any problems? Yes.    Have you been able to return to your normal activities? Yes.    Do you have any questions about your discharge instructions: Diet   No. Medications  No. Follow up visit  No.  Do you have questions or concerns about your Care? No.  Actions: * If pain score is 4 or above: No action needed, pain <4.  1. Have you developed a fever since your procedure? no  2.   Have you had an respiratory symptoms (SOB or cough) since your procedure? no  3.   Have you tested positive for COVID 19 since your procedure no  4.   Have you had any family members/close contacts diagnosed with the COVID 19 since your procedure?  no   If yes to any of these questions please route to Joylene John, RN and Joella Prince, RN

## 2020-09-29 DIAGNOSIS — M79605 Pain in left leg: Secondary | ICD-10-CM | POA: Diagnosis not present

## 2020-10-03 DIAGNOSIS — M4316 Spondylolisthesis, lumbar region: Secondary | ICD-10-CM | POA: Diagnosis not present

## 2020-10-03 DIAGNOSIS — G9519 Other vascular myelopathies: Secondary | ICD-10-CM | POA: Diagnosis not present

## 2020-10-03 DIAGNOSIS — M6289 Other specified disorders of muscle: Secondary | ICD-10-CM | POA: Diagnosis not present

## 2020-10-03 DIAGNOSIS — M5416 Radiculopathy, lumbar region: Secondary | ICD-10-CM | POA: Diagnosis not present

## 2020-10-19 DIAGNOSIS — M0579 Rheumatoid arthritis with rheumatoid factor of multiple sites without organ or systems involvement: Secondary | ICD-10-CM | POA: Diagnosis not present

## 2020-10-21 DIAGNOSIS — S76311D Strain of muscle, fascia and tendon of the posterior muscle group at thigh level, right thigh, subsequent encounter: Secondary | ICD-10-CM | POA: Diagnosis not present

## 2020-10-24 DIAGNOSIS — M5416 Radiculopathy, lumbar region: Secondary | ICD-10-CM | POA: Diagnosis not present

## 2020-10-24 DIAGNOSIS — M48062 Spinal stenosis, lumbar region with neurogenic claudication: Secondary | ICD-10-CM | POA: Diagnosis not present

## 2020-10-24 DIAGNOSIS — M6289 Other specified disorders of muscle: Secondary | ICD-10-CM | POA: Diagnosis not present

## 2020-10-28 DIAGNOSIS — M7072 Other bursitis of hip, left hip: Secondary | ICD-10-CM | POA: Diagnosis not present

## 2020-10-28 DIAGNOSIS — S76311D Strain of muscle, fascia and tendon of the posterior muscle group at thigh level, right thigh, subsequent encounter: Secondary | ICD-10-CM | POA: Diagnosis not present

## 2020-10-31 DIAGNOSIS — M48061 Spinal stenosis, lumbar region without neurogenic claudication: Secondary | ICD-10-CM | POA: Diagnosis not present

## 2020-10-31 DIAGNOSIS — M5116 Intervertebral disc disorders with radiculopathy, lumbar region: Secondary | ICD-10-CM | POA: Diagnosis not present

## 2020-10-31 DIAGNOSIS — M4316 Spondylolisthesis, lumbar region: Secondary | ICD-10-CM | POA: Diagnosis not present

## 2020-10-31 DIAGNOSIS — M545 Low back pain, unspecified: Secondary | ICD-10-CM | POA: Diagnosis not present

## 2020-11-07 DIAGNOSIS — G9519 Other vascular myelopathies: Secondary | ICD-10-CM | POA: Diagnosis not present

## 2020-11-16 DIAGNOSIS — Z6828 Body mass index (BMI) 28.0-28.9, adult: Secondary | ICD-10-CM | POA: Diagnosis not present

## 2020-11-16 DIAGNOSIS — E663 Overweight: Secondary | ICD-10-CM | POA: Diagnosis not present

## 2020-11-16 DIAGNOSIS — M150 Primary generalized (osteo)arthritis: Secondary | ICD-10-CM | POA: Diagnosis not present

## 2020-11-16 DIAGNOSIS — M2550 Pain in unspecified joint: Secondary | ICD-10-CM | POA: Diagnosis not present

## 2020-11-16 DIAGNOSIS — M0579 Rheumatoid arthritis with rheumatoid factor of multiple sites without organ or systems involvement: Secondary | ICD-10-CM | POA: Diagnosis not present

## 2020-11-16 DIAGNOSIS — Z79899 Other long term (current) drug therapy: Secondary | ICD-10-CM | POA: Diagnosis not present

## 2020-11-24 DIAGNOSIS — M48062 Spinal stenosis, lumbar region with neurogenic claudication: Secondary | ICD-10-CM | POA: Diagnosis not present

## 2020-11-25 DIAGNOSIS — S76311D Strain of muscle, fascia and tendon of the posterior muscle group at thigh level, right thigh, subsequent encounter: Secondary | ICD-10-CM | POA: Diagnosis not present

## 2020-12-02 ENCOUNTER — Other Ambulatory Visit: Payer: Self-pay | Admitting: Neurological Surgery

## 2020-12-02 DIAGNOSIS — M48061 Spinal stenosis, lumbar region without neurogenic claudication: Secondary | ICD-10-CM

## 2020-12-20 DIAGNOSIS — M48062 Spinal stenosis, lumbar region with neurogenic claudication: Secondary | ICD-10-CM | POA: Diagnosis not present

## 2020-12-21 DIAGNOSIS — M0579 Rheumatoid arthritis with rheumatoid factor of multiple sites without organ or systems involvement: Secondary | ICD-10-CM | POA: Diagnosis not present

## 2021-01-18 ENCOUNTER — Other Ambulatory Visit: Payer: Self-pay | Admitting: Gastroenterology

## 2021-01-18 DIAGNOSIS — K219 Gastro-esophageal reflux disease without esophagitis: Secondary | ICD-10-CM

## 2021-01-19 ENCOUNTER — Encounter (HOSPITAL_COMMUNITY): Payer: Self-pay

## 2021-01-19 ENCOUNTER — Other Ambulatory Visit (HOSPITAL_COMMUNITY)
Admission: RE | Admit: 2021-01-19 | Discharge: 2021-01-19 | Disposition: A | Discharge status: Home / Self Care | Payer: PPO | Source: Ambulatory Visit | Attending: Neurological Surgery | Admitting: Neurological Surgery

## 2021-01-19 ENCOUNTER — Ambulatory Visit (HOSPITAL_COMMUNITY)
Admission: RE | Admit: 2021-01-19 | Discharge: 2021-01-19 | Disposition: A | Discharge status: Home / Self Care | Payer: PPO | Source: Ambulatory Visit | Attending: Neurological Surgery | Admitting: Neurological Surgery

## 2021-01-19 ENCOUNTER — Encounter (HOSPITAL_COMMUNITY)
Admission: RE | Admit: 2021-01-19 | Discharge: 2021-01-19 | Disposition: A | Discharge status: Home / Self Care | Payer: PPO | Source: Ambulatory Visit | Attending: Neurological Surgery | Admitting: Neurological Surgery

## 2021-01-19 ENCOUNTER — Other Ambulatory Visit: Payer: Self-pay

## 2021-01-19 DIAGNOSIS — Z01818 Encounter for other preprocedural examination: Secondary | ICD-10-CM | POA: Insufficient documentation

## 2021-01-19 DIAGNOSIS — M48061 Spinal stenosis, lumbar region without neurogenic claudication: Secondary | ICD-10-CM | POA: Diagnosis not present

## 2021-01-19 DIAGNOSIS — I10 Essential (primary) hypertension: Secondary | ICD-10-CM | POA: Diagnosis not present

## 2021-01-19 DIAGNOSIS — Z20822 Contact with and (suspected) exposure to covid-19: Secondary | ICD-10-CM | POA: Insufficient documentation

## 2021-01-19 DIAGNOSIS — Z7982 Long term (current) use of aspirin: Secondary | ICD-10-CM | POA: Insufficient documentation

## 2021-01-19 DIAGNOSIS — Z01812 Encounter for preprocedural laboratory examination: Secondary | ICD-10-CM | POA: Insufficient documentation

## 2021-01-19 DIAGNOSIS — D649 Anemia, unspecified: Secondary | ICD-10-CM | POA: Insufficient documentation

## 2021-01-19 DIAGNOSIS — J45909 Unspecified asthma, uncomplicated: Secondary | ICD-10-CM | POA: Diagnosis not present

## 2021-01-19 DIAGNOSIS — E785 Hyperlipidemia, unspecified: Secondary | ICD-10-CM | POA: Diagnosis not present

## 2021-01-19 DIAGNOSIS — Z79899 Other long term (current) drug therapy: Secondary | ICD-10-CM | POA: Diagnosis not present

## 2021-01-19 HISTORY — DX: Fatty (change of) liver, not elsewhere classified: K76.0

## 2021-01-19 LAB — TYPE AND SCREEN
ABO/RH(D): O POS
Antibody Screen: NEGATIVE

## 2021-01-19 LAB — COMPREHENSIVE METABOLIC PANEL
ALT: 38 U/L (ref 0–44)
AST: 32 U/L (ref 15–41)
Albumin: 4.2 g/dL (ref 3.5–5.0)
Alkaline Phosphatase: 48 U/L (ref 38–126)
Anion gap: 14 (ref 5–15)
BUN: 20 mg/dL (ref 8–23)
CO2: 24 mmol/L (ref 22–32)
Calcium: 9.6 mg/dL (ref 8.9–10.3)
Chloride: 98 mmol/L (ref 98–111)
Creatinine, Ser: 0.85 mg/dL (ref 0.44–1.00)
GFR, Estimated: >60 mL/min (ref >60)
Glucose, Bld: 89 mg/dL (ref 70–99)
Potassium: 3.3 mmol/L — ABNORMAL LOW (ref 3.5–5.1)
Sodium: 136 mmol/L (ref 135–145)
Total Bilirubin: 0.8 mg/dL (ref 0.3–1.2)
Total Protein: 8.5 g/dL — ABNORMAL HIGH (ref 6.5–8.1)

## 2021-01-19 LAB — SURGICAL PCR SCREEN
MRSA, PCR: NEGATIVE
Staphylococcus aureus: NEGATIVE

## 2021-01-19 LAB — CBC WITH DIFFERENTIAL/PLATELET
Abs Immature Granulocytes: 0.02 10*3/uL (ref 0.00–0.07)
Basophils Absolute: 0.1 10*3/uL (ref 0.0–0.1)
Basophils Relative: 1 %
Eosinophils Absolute: 0.3 10*3/uL (ref 0.0–0.5)
Eosinophils Relative: 4 %
HCT: 42.2 % (ref 36.0–46.0)
Hemoglobin: 14.7 g/dL (ref 12.0–15.0)
Immature Granulocytes: 0 %
Lymphocytes Relative: 45 %
Lymphs Abs: 3.6 10*3/uL (ref 0.7–4.0)
MCH: 33.3 pg (ref 26.0–34.0)
MCHC: 34.8 g/dL (ref 30.0–36.0)
MCV: 95.5 fL (ref 80.0–100.0)
Monocytes Absolute: 1 10*3/uL (ref 0.1–1.0)
Monocytes Relative: 12 %
Neutro Abs: 3.1 10*3/uL (ref 1.7–7.7)
Neutrophils Relative %: 38 %
Platelets: 184 10*3/uL (ref 150–400)
RBC: 4.42 MIL/uL (ref 3.87–5.11)
RDW: 13 % (ref 11.5–15.5)
WBC: 8.1 10*3/uL (ref 4.0–10.5)
nRBC: 0 % (ref 0.0–0.2)

## 2021-01-19 LAB — PROTIME-INR
INR: 0.9 (ref 0.8–1.2)
Prothrombin Time: 11.7 seconds (ref 11.4–15.2)

## 2021-01-19 NOTE — Progress Notes (Signed)
Surgical Instructions  Your procedure is scheduled on Monday, March 7th.  Report to [facility] Main Entrance "A" at 5:30 A.M., then check in with the Admitting office.  Call this number if you have problems the morning of surgery:  [phone]   If you have any questions prior to your surgery date call [phone]: Open Monday-Friday 8am-4pm   Remember:  Do not eat or drink after midnight the night before your surgery    Take these medicines the morning of surgery with A SIP OF WATER  amLODipine (NORVASC) omeprazole (PRILOSEC)  If needed: acetaminophen (TYLENOL), fexofenadine (ALLEGRA), valACYclovir (VALTREX)   Follow your surgeon's instructions on when to stop Aspirin.  If no instructions were given by your surgeon then you will need to call the office to get those instructions.    As of today, STOP taking meloxicam (MOBIC), Aleve, Naproxen, Ibuprofen, Motrin, Advil, Goody's, BC's, all herbal medications, fish oil, and all vitamins.                     Do not wear jewelry, make up, or nail polish            Do not wear lotions, powders, perfumes, or deodorant.            Do not shave 48 hours prior to surgery.              Do not bring valuables to the hospital.            [facility] is not responsible for any belongings or valuables.  Do NOT Smoke (Tobacco/Vaping) or drink Alcohol 24 hours prior to your procedure If you use a CPAP at night, you may bring all equipment for your overnight stay.   Contacts, glasses, dentures or bridgework may not be worn into surgery, please bring cases for these belongings   For patients admitted to the hospital, discharge time will be determined by your treatment team.   Patients discharged the day of surgery will not be allowed to drive home, and someone needs to stay with them for 24 hours.  Special instructions:   [facility]- Preparing For Surgery  Before surgery, you can play an important role. Because skin is not sterile, your skin  needs to be as free of germs as possible. You can reduce the number of germs on your skin by washing with CHG (chlorahexidine gluconate) Soap before surgery.  CHG is an antiseptic cleaner which kills germs and bonds with the skin to continue killing germs even after washing.    Oral Hygiene is also important to reduce your risk of infection.  Remember - BRUSH YOUR TEETH THE MORNING OF SURGERY WITH YOUR REGULAR TOOTHPASTE  Please do not use if you have an allergy to CHG or antibacterial soaps. If your skin becomes reddened/irritated stop using the CHG.  Do not shave (including legs and underarms) for at least 48 hours prior to first CHG shower. It is OK to shave your face.  Please follow these instructions carefully.   1. Shower the NIGHT BEFORE SURGERY and the MORNING OF SURGERY  2. If you chose to wash your hair, wash your hair first as usual with your normal shampoo.  3. After you shampoo, rinse your hair and body thoroughly to remove the shampoo.  4. Wash Face and genitals (private parts) with your normal soap.   5. Use CHG Soap as you would any other liquid soap. You can apply CHG directly to the skin  and wash gently with a scrungie or a clean washcloth.   6. Apply the CHG Soap to your body ONLY FROM THE NECK DOWN.  Do not use on open wounds or open sores. Avoid contact with your eyes, ears, mouth and genitals (private parts). Wash Face and genitals (private parts)  with your normal soap.   7. Wash thoroughly, paying special attention to the area where your surgery will be performed.  8. Thoroughly rinse your body with warm water from the neck down.  9. DO NOT shower/wash with your normal soap after using and rinsing off the CHG Soap.  10. Pat yourself dry with a CLEAN TOWEL.  11. Wear CLEAN PAJAMAS to bed the night before surgery  12. Place CLEAN SHEETS on your bed the night before your surgery  13. DO NOT SLEEP WITH PETS.  Day of Surgery: Shower with CHG soap Wear  Clean/Comfortable clothing the morning of surgery Do not apply any deodorants/lotions.   Remember to brush your teeth WITH YOUR REGULAR TOOTHPASTE.   Please read over the following fact sheets that you were given.

## 2021-01-19 NOTE — Progress Notes (Signed)
PCP - Dr. Velna Hatchet (records requested) Cardiologist - denies seeing one currently Rheumatologist - Dr. Berna Bue [facility]-records requested) PPM/ICD - denies  Chest x-ray - 01/19/2021 EKG - 01/19/2021 Stress Test - 05/19/2019 ECHO - 05/19/2019 Cardiac Cath - patient stated "think it was done in 2014 and nothing was found"  Sleep Study - denies CPAP - N/A  DM: denies  Blood Thinner Instructions: N/A Aspirin Instructions: per patient last dose was on 01/13/2021  ERAS Protcol - No  COVID TEST- Scheduled for today 01/19/2021. Patient verbalized understanding of self-quarantine instructions, appointment time and place.  Anesthesia review: YES, cardiac record requested, records requested from PCP Patient stated she has fatty liver and had either an ultrasound or CT scan done several years ago. Per patient her Rheumatologist was the one who ordered it and followed up due to abnormal labs. Per patient seen Dr. Vernell Leep in the past when neurologist referred her to initially diagnose current issue, normal cardiac testing(2020) no follow-up was needed since then per patient.  Patient stated had a cardiac cath done in [place] about 8 years ago - records requested  Patient denies shortness of breath, fever, cough and chest pain at PAT appointment.  All instructions explained to the patient, with a verbal understanding of the material. Patient agrees to go over the instructions while at home for a better understanding. Patient also instructed to self quarantine after being tested for COVID-19. The opportunity to ask questions was provided.

## 2021-01-20 LAB — SARS CORONAVIRUS 2 (TAT 6-24 HRS): SARS Coronavirus 2: NEGATIVE

## 2021-01-20 NOTE — Progress Notes (Addendum)
Anesthesia Chart Review:  Case: [id number] Date/Time: 01/23/21 0715   Procedure: PLIF - L3-L4 - L4-L5 (N/A Back)   Anesthesia type: General   Pre-op diagnosis: Stenosis   Location: [place] [facility] ROOM 20 / [place] [facility]   Surgeons: Eustace Moore, MD      DISCUSSION:Patient is a 70 year old female scheduled for the above procedure.  History includes never smoker, post-operative N/V, RA, fatty liver, HTN, HLD, GERD, asthma, diverticulitis, anemia, cholecystectomy (01/31/17).  She had cardiology work-up in 2020 at [facility] for evaluation of exertional dyspnea, pain under her breasts, and leg pain. Her workup showed structurally normal heart on echocardiogram with mild MR, no ischemia/infarction on stress test, and only mildly abnormal ABI. As needed follow-up recommended at 05/27/19 visit.     Pfizer COVID-19 vaccine 07/14/20. 01/20/21 presurgical COVID-19 test negative. Anesthesia team to evaluate on the day of surgery.   VS: BP 119/67   Pulse 77   Temp 37.1 C (Oral)   Resp 17   Ht 5\' 3"  (1.6 m)   Wt 69.5 kg   SpO2 100%   BMI 27.14 kg/m    PROVIDERS: Velna Hatchet, MD is PCP  Gavin Pound, MD is rheumatologist Vernell Leep, MD is cardiologist Narda Amber, DO is neurologist Owens Loffler, MD is GI   LABS: Labs reviewed: Acceptable for surgery. (all labs ordered are listed, but only abnormal results are displayed)  Labs Reviewed  COMPREHENSIVE METABOLIC PANEL - Abnormal; Notable for the following components:      Result Value   Potassium 3.3 (*)    Total Protein 8.5 (*)    All other components within normal limits  SURGICAL PCR SCREEN  CBC WITH DIFFERENTIAL/PLATELET  PROTIME-INR  TYPE AND SCREEN     IMAGES: CXR 01/19/21:  FINDINGS: The heart size and mediastinal contours are within normal limits. Both lungs are clear. The visualized skeletal structures are unremarkable. IMPRESSION: No active cardiopulmonary disease.  MRI L-spine 10/31/20: IMPRESSION:  1.  Chronic anterolisthesis at L3-L4 and L4-L5 with disc and advanced  posterior element degeneration.  Subsequent severe spinal and lateral recess stenosis at both levels.  Moderate to severe right L3 and left L4 neural foraminal stenosis.  2. Mild for age lumbar spine degeneration elsewhere.     EKG: 01/19/21: NSR   CV: Echocardiogram 05/19/2019:  Normal LV systolic function with EF 56%. Left ventricle cavity is normal  in size. Mild concentric hypertrophy of the left ventricle. Normal global  wall motion. Normal diastolic filling pattern. Calculated EF 56%.  Mild (Grade I) mitral regurgitation.  Inadequate TR jet to estimate pulmonary artery systolic pressure. Normal  right atrial pressure.   ABI 05/19/2019:  This exam reveals mildly decreased perfusion of the bilateral lower  extremity, noted at the post tibial artery level (ABI 0.93). T Mildly  abnormal biphasic waveform at the level of the ankle.  [place] Stress Test 04/27/2019: Stress EKG is non-diagnostic, as this is pharmacological stress test. Normal myocardial perfusion. Stress LV EF: 72%.  Low risk study.  She thought she had an unremarkable cardiac cath around 2014 at [facility] in [place], but "patient not found" per their HIM department.   Past Medical History:  Diagnosis Date  . Anemia   . Anxiety   . Arthritis   . Asthma   . Cataract   . Colon polyps   . Complication of anesthesia    "sometimes I dont like to wake up after " ie difficult to awaken   .  Diverticulitis   . Diverticulosis   . Fatty liver   . GERD (gastroesophageal reflux disease)   . HLD (hyperlipidemia)   . HTN (hypertension)   . IBS (irritable bowel syndrome)   . Insomnia   . MRSA infection   . Osteoarthritis   . Osteopenia   . PONV (postoperative nausea and vomiting)   . RA (rheumatoid arthritis) ([place])   . Rheumatoid arthritis ([place])   . Seasonal allergies   . Shingles   . Status post dilation of esophageal  narrowing   . Vitamin D deficiency     Past Surgical History:  Procedure Laterality Date  . APPENDECTOMY  1975  . bladder tack  1990  . CATARACT EXTRACTION, BILATERAL Bilateral   . CHOLECYSTECTOMY N/A 01/31/2017   Procedure: LAPAROSCOPIC CHOLECYSTECTOMY WITH INTRAOPERATIVE CHOLANGIOGRAM;  Surgeon: Armandina Gemma, MD;  Location: [facility];  Service: General;  Laterality: N/A;  . INCISION AND DRAINAGE PERIRECTAL ABSCESS N/A 12/02/2017   Procedure: IRRIGATION AND DEBRIDEMENT PERINEAL ABSCESS;  Surgeon: Greer Pickerel, MD;  Location: [place];  Service: General;  Laterality: N/A;  . MENISCUS REPAIR Right 2013  . ROTATOR CUFF REPAIR Right 2005  . TONSILLECTOMY     as a child  . TOTAL ABDOMINAL HYSTERECTOMY  1988  . TOTAL KNEE ARTHROPLASTY Left   . WRIST RECONSTRUCTION Right 2008    MEDICATIONS: . acetaminophen (TYLENOL) 500 MG tablet  . amLODipine (NORVASC) 10 MG tablet  . Ascorbic Acid (VITAMIN C PO)  . Ascorbic Acid (VITAMIN C) 1000 MG tablet  . aspirin EC 81 MG tablet  . Calcium Citrate (CITRACAL PO)  . CANNABIDIOL PO  . Cholecalciferol (VITAMIN D) 50 MCG (2000 UT) CAPS  . Cholecalciferol (VITAMIN D-3) 125 MCG (5000 UT) TABS  . [place] Calcium 1000 (390 Ca) MG TABS  . fexofenadine (ALLEGRA) 180 MG tablet  . folic acid (FOLVITE) 1 MG tablet  . hydrochlorothiazide (HYDRODIURIL) 25 MG tablet  . inFLIXimab-abda (RENFLEXIS) 100 MG SOLR  . losartan (COZAAR) 100 MG tablet  . MELATONIN PO  . meloxicam (MOBIC) 15 MG tablet  . Multiple Vitamin (MULTIVITAMIN) tablet  . NON FORMULARY  . Omega-3 Fatty Acids (FISH OIL) 1200 MG CAPS  . omeprazole (PRILOSEC) 40 MG capsule  . polycarbophil (FIBERCON) 625 MG tablet  . polyethylene glycol (MIRALAX / GLYCOLAX) 17 g packet  . PSYLLIUM PO  . valACYclovir (VALTREX) 500 MG tablet   No current facility-administered medications for this encounter.  Last ASA 01/13/21.    Myra Gianotti, PA-C Surgical Short Stay/Anesthesiology [facility] Phone [phone] [facility]  Phone [phone] 01/20/2021 4:20 PM

## 2021-01-20 NOTE — Anesthesia Preprocedure Evaluation (Addendum)
Anesthesia Evaluation  Patient identified by MRN, date of birth, ID band Patient awake    Reviewed: Allergy & Precautions, NPO status , Patient's Chart, lab work & pertinent test results  History of Anesthesia Complications (+) PONV  Airway Mallampati: II  TM Distance: >3 FB Neck ROM: Full    Dental no notable dental hx.    Pulmonary asthma ,    Pulmonary exam normal breath sounds clear to auscultation       Cardiovascular hypertension, Normal cardiovascular exam Rhythm:Regular Rate:Normal     Neuro/Psych Anxiety negative neurological ROS     GI/Hepatic Neg liver ROS, GERD  Medicated and Controlled,IBS   Endo/Other  negative endocrine ROS  Renal/GU negative Renal ROS     Musculoskeletal  (+) Arthritis , Rheumatoid disorders,    Abdominal   Peds  Hematology HLD   Anesthesia Other Findings Lumbar Stenosis  Reproductive/Obstetrics                           Anesthesia Physical Anesthesia Plan  ASA: III  Anesthesia Plan: General   Post-op Pain Management:    Induction: Intravenous  PONV Risk Score and Plan: 4 or greater and Ondansetron, Dexamethasone, Propofol infusion, Midazolam, Amisulpride and Treatment may vary due to age or medical condition  Airway Management Planned: Oral ETT  Additional Equipment:   Intra-op Plan:   Post-operative Plan: Extubation in OR  Informed Consent: I have reviewed the patients History and Physical, chart, labs and discussed the procedure including the risks, benefits and alternatives for the proposed anesthesia with the patient or authorized representative who has indicated his/her understanding and acceptance.     Dental advisory given  Plan Discussed with: CRNA  Anesthesia Plan Comments: (Reviewed PAT note written 01/20/2021 by Myra Gianotti, PA-C. )      Anesthesia Quick Evaluation

## 2021-01-23 ENCOUNTER — Inpatient Hospital Stay (HOSPITAL_COMMUNITY): Payer: PPO | Admitting: Vascular Surgery

## 2021-01-23 ENCOUNTER — Encounter (HOSPITAL_COMMUNITY)
Admission: RE | Disposition: A | Discharge status: Home / Self Care | Payer: Self-pay | Source: Home / Self Care | Attending: Neurological Surgery

## 2021-01-23 ENCOUNTER — Inpatient Hospital Stay (HOSPITAL_COMMUNITY): Payer: PPO

## 2021-01-23 ENCOUNTER — Inpatient Hospital Stay (HOSPITAL_COMMUNITY): Payer: PPO | Admitting: Anesthesiology

## 2021-01-23 ENCOUNTER — Inpatient Hospital Stay (HOSPITAL_COMMUNITY)
Admission: RE | Admit: 2021-01-23 | Discharge: 2021-01-24 | DRG: 455 | Disposition: A | Discharge status: Home / Self Care | Payer: PPO | Attending: Neurological Surgery | Admitting: Neurological Surgery

## 2021-01-23 ENCOUNTER — Encounter (HOSPITAL_COMMUNITY): Payer: Self-pay | Admitting: Neurological Surgery

## 2021-01-23 DIAGNOSIS — Z20822 Contact with and (suspected) exposure to covid-19: Secondary | ICD-10-CM | POA: Diagnosis present

## 2021-01-23 DIAGNOSIS — K760 Fatty (change of) liver, not elsewhere classified: Secondary | ICD-10-CM | POA: Diagnosis not present

## 2021-01-23 DIAGNOSIS — Z419 Encounter for procedure for purposes other than remedying health state, unspecified: Secondary | ICD-10-CM

## 2021-01-23 DIAGNOSIS — Z981 Arthrodesis status: Secondary | ICD-10-CM | POA: Diagnosis not present

## 2021-01-23 DIAGNOSIS — M48061 Spinal stenosis, lumbar region without neurogenic claudication: Secondary | ICD-10-CM | POA: Diagnosis not present

## 2021-01-23 DIAGNOSIS — J45909 Unspecified asthma, uncomplicated: Secondary | ICD-10-CM | POA: Diagnosis not present

## 2021-01-23 DIAGNOSIS — M5136 Other intervertebral disc degeneration, lumbar region: Secondary | ICD-10-CM | POA: Diagnosis not present

## 2021-01-23 DIAGNOSIS — E785 Hyperlipidemia, unspecified: Secondary | ICD-10-CM | POA: Diagnosis not present

## 2021-01-23 DIAGNOSIS — M4316 Spondylolisthesis, lumbar region: Secondary | ICD-10-CM | POA: Diagnosis not present

## 2021-01-23 DIAGNOSIS — Z7982 Long term (current) use of aspirin: Secondary | ICD-10-CM

## 2021-01-23 DIAGNOSIS — G4700 Insomnia, unspecified: Secondary | ICD-10-CM | POA: Diagnosis not present

## 2021-01-23 DIAGNOSIS — K219 Gastro-esophageal reflux disease without esophagitis: Secondary | ICD-10-CM | POA: Diagnosis not present

## 2021-01-23 DIAGNOSIS — I10 Essential (primary) hypertension: Secondary | ICD-10-CM | POA: Diagnosis not present

## 2021-01-23 DIAGNOSIS — Z79899 Other long term (current) drug therapy: Secondary | ICD-10-CM

## 2021-01-23 DIAGNOSIS — Z96652 Presence of left artificial knee joint: Secondary | ICD-10-CM | POA: Diagnosis present

## 2021-01-23 DIAGNOSIS — M069 Rheumatoid arthritis, unspecified: Secondary | ICD-10-CM | POA: Diagnosis present

## 2021-01-23 DIAGNOSIS — Z791 Long term (current) use of non-steroidal anti-inflammatories (NSAID): Secondary | ICD-10-CM | POA: Diagnosis not present

## 2021-01-23 DIAGNOSIS — M4186 Other forms of scoliosis, lumbar region: Secondary | ICD-10-CM | POA: Diagnosis present

## 2021-01-23 DIAGNOSIS — M4326 Fusion of spine, lumbar region: Secondary | ICD-10-CM | POA: Diagnosis not present

## 2021-01-23 HISTORY — DX: Arthrodesis status: Z98.1

## 2021-01-23 LAB — ABO/RH: ABO/RH(D): O POS

## 2021-01-23 SURGERY — POSTERIOR LUMBAR FUSION 2 LEVEL
Anesthesia: General | Site: Back

## 2021-01-23 MED ORDER — ACETAMINOPHEN 500 MG PO TABS
ORAL_TABLET | ORAL | Status: AC
  Administered 2021-01-23: 1000 mg via ORAL
  Filled 2021-01-23: qty 2

## 2021-01-23 MED ORDER — ASCORBIC ACID 500 MG PO TABS
1000.0000 mg | ORAL_TABLET | Freq: Every day | ORAL | Status: DC
Start: 2021-01-23 — End: 2021-01-24
  Administered 2021-01-24: 1000 mg via ORAL
  Filled 2021-01-23 (×2): qty 2

## 2021-01-23 MED ORDER — POTASSIUM CHLORIDE IN NACL 20-0.9 MEQ/L-% IV SOLN: INTRAVENOUS | Status: DC

## 2021-01-23 MED ORDER — METHOCARBAMOL 500 MG PO TABS
500.0000 mg | ORAL_TABLET | Freq: Four times a day (QID) | ORAL | Status: DC | PRN
  Administered 2021-01-23 – 2021-01-24 (×4): 500 mg via ORAL
  Filled 2021-01-23 (×3): qty 1

## 2021-01-23 MED ORDER — FENTANYL CITRATE (PF) 250 MCG/5ML IJ SOLN
INTRAMUSCULAR | Status: AC
  Filled 2021-01-23: qty 5

## 2021-01-23 MED ORDER — SUGAMMADEX SODIUM 200 MG/2ML IV SOLN
INTRAVENOUS | Status: DC | PRN
  Administered 2021-01-23: 200 mg via INTRAVENOUS

## 2021-01-23 MED ORDER — BUPIVACAINE HCL (PF) 0.25 % IJ SOLN
INTRAMUSCULAR | Status: DC | PRN
  Administered 2021-01-23: 7 mL

## 2021-01-23 MED ORDER — SENNA 8.6 MG PO TABS
1.0000 | ORAL_TABLET | Freq: Two times a day (BID) | ORAL | Status: DC
  Administered 2021-01-23 – 2021-01-24 (×3): 8.6 mg via ORAL
  Filled 2021-01-23 (×3): qty 1

## 2021-01-23 MED ORDER — GABAPENTIN 300 MG PO CAPS
ORAL_CAPSULE | ORAL | Status: AC
  Administered 2021-01-23: 300 mg via ORAL
  Filled 2021-01-23: qty 1

## 2021-01-23 MED ORDER — FENTANYL CITRATE (PF) 100 MCG/2ML IJ SOLN
25.0000 ug | INTRAMUSCULAR | Status: DC | PRN
  Administered 2021-01-23: 25 ug via INTRAVENOUS
  Administered 2021-01-23: 50 ug via INTRAVENOUS
  Administered 2021-01-23: 25 ug via INTRAVENOUS
  Administered 2021-01-23: 50 ug via INTRAVENOUS

## 2021-01-23 MED ORDER — ONDANSETRON HCL 4 MG PO TABS: 4.0000 mg | ORAL_TABLET | Freq: Four times a day (QID) | ORAL | Status: DC | PRN

## 2021-01-23 MED ORDER — PROPOFOL 10 MG/ML IV BOLUS
INTRAVENOUS | Status: DC | PRN
  Administered 2021-01-23: 100 mg via INTRAVENOUS

## 2021-01-23 MED ORDER — METHOCARBAMOL 500 MG PO TABS
ORAL_TABLET | ORAL | Status: AC
  Filled 2021-01-23: qty 1

## 2021-01-23 MED ORDER — HEPARIN SODIUM (PORCINE) 1000 UNIT/ML IJ SOLN
INTRAMUSCULAR | Status: AC
  Filled 2021-01-23: qty 1

## 2021-01-23 MED ORDER — ACETAMINOPHEN 650 MG RE SUPP: 650.0000 mg | RECTAL | Status: DC | PRN

## 2021-01-23 MED ORDER — ORAL CARE MOUTH RINSE: 15.0000 mL | Freq: Once | OROMUCOSAL | Status: AC

## 2021-01-23 MED ORDER — HYDROCHLOROTHIAZIDE 25 MG PO TABS
25.0000 mg | ORAL_TABLET | Freq: Every day | ORAL | Status: DC
Start: 2021-01-23 — End: 2021-01-24
  Administered 2021-01-23: 25 mg via ORAL
  Filled 2021-01-23: qty 1

## 2021-01-23 MED ORDER — CEFAZOLIN SODIUM-DEXTROSE 2-3 GM-%(50ML) IV SOLR
INTRAVENOUS | Status: DC | PRN
  Administered 2021-01-23: 2 g via INTRAVENOUS

## 2021-01-23 MED ORDER — DEXAMETHASONE SODIUM PHOSPHATE 10 MG/ML IJ SOLN: 10.0000 mg | Freq: Once | INTRAMUSCULAR | Status: DC

## 2021-01-23 MED ORDER — THROMBIN 20000 UNITS EX SOLR
CUTANEOUS | Status: AC
  Filled 2021-01-23: qty 20000

## 2021-01-23 MED ORDER — LIDOCAINE 2% (20 MG/ML) 5 ML SYRINGE
INTRAMUSCULAR | Status: DC | PRN
  Administered 2021-01-23: 20 mg via INTRAVENOUS

## 2021-01-23 MED ORDER — OXYCODONE HCL 5 MG PO TABS
ORAL_TABLET | ORAL | Status: AC
  Filled 2021-01-23: qty 2

## 2021-01-23 MED ORDER — ARTHREX ANGEL - ACD-A SOLUTION (CHARTING ONLY) OPTIME
TOPICAL | Status: DC | PRN
  Administered 2021-01-23: 10 mL via TOPICAL

## 2021-01-23 MED ORDER — DEXAMETHASONE SODIUM PHOSPHATE 10 MG/ML IJ SOLN
INTRAMUSCULAR | Status: AC
  Filled 2021-01-23: qty 1

## 2021-01-23 MED ORDER — DEXAMETHASONE SODIUM PHOSPHATE 4 MG/ML IJ SOLN
4.0000 mg | Freq: Four times a day (QID) | INTRAMUSCULAR | Status: DC
  Administered 2021-01-23 – 2021-01-24 (×3): 4 mg via INTRAVENOUS
  Filled 2021-01-23 (×3): qty 1

## 2021-01-23 MED ORDER — ONDANSETRON HCL 4 MG/2ML IJ SOLN: 4.0000 mg | Freq: Once | INTRAMUSCULAR | Status: DC | PRN

## 2021-01-23 MED ORDER — MIDAZOLAM HCL 2 MG/2ML IJ SOLN
INTRAMUSCULAR | Status: DC | PRN
  Administered 2021-01-23: 2 mg via INTRAVENOUS

## 2021-01-23 MED ORDER — 0.9 % SODIUM CHLORIDE (POUR BTL) OPTIME
TOPICAL | Status: DC | PRN
  Administered 2021-01-23: 1000 mL

## 2021-01-23 MED ORDER — CHLORHEXIDINE GLUCONATE 0.12 % MT SOLN
15.0000 mL | Freq: Once | OROMUCOSAL | Status: AC
  Administered 2021-01-23: 15 mL via OROMUCOSAL

## 2021-01-23 MED ORDER — AMLODIPINE BESYLATE 5 MG PO TABS
10.0000 mg | ORAL_TABLET | Freq: Every day | ORAL | Status: DC
  Administered 2021-01-24: 10 mg via ORAL
  Filled 2021-01-23: qty 2

## 2021-01-23 MED ORDER — PROPOFOL 10 MG/ML IV BOLUS
INTRAVENOUS | Status: AC
  Filled 2021-01-23: qty 20

## 2021-01-23 MED ORDER — THROMBIN 5000 UNITS EX SOLR
OROMUCOSAL | Status: DC | PRN
  Administered 2021-01-23: 5 mL via TOPICAL

## 2021-01-23 MED ORDER — SODIUM CHLORIDE 0.9 % IV SOLN
250.0000 mL | INTRAVENOUS | Status: DC
  Administered 2021-01-23: 250 mL via INTRAVENOUS

## 2021-01-23 MED ORDER — ASPIRIN EC 81 MG PO TBEC
81.0000 mg | DELAYED_RELEASE_TABLET | Freq: Every day | ORAL | Status: DC
Start: 2021-01-23 — End: 2021-01-24
  Administered 2021-01-23 – 2021-01-24 (×2): 81 mg via ORAL
  Filled 2021-01-23 (×2): qty 1

## 2021-01-23 MED ORDER — VITAMIN D 25 MCG (1000 UNIT) PO TABS
2000.0000 [IU] | ORAL_TABLET | Freq: Every day | ORAL | Status: DC
  Administered 2021-01-23 – 2021-01-24 (×2): 2000 [IU] via ORAL
  Filled 2021-01-23 (×2): qty 2

## 2021-01-23 MED ORDER — THROMBIN (RECOMBINANT) 5000 UNITS EX SOLR
CUTANEOUS | Status: AC
  Filled 2021-01-23: qty 5000

## 2021-01-23 MED ORDER — LOSARTAN POTASSIUM 50 MG PO TABS: 100.0000 mg | ORAL_TABLET | Freq: Every day | ORAL | Status: DC

## 2021-01-23 MED ORDER — ADULT MULTIVITAMIN W/MINERALS CH: 1.0000 | ORAL_TABLET | Freq: Every day | ORAL | Status: DC

## 2021-01-23 MED ORDER — CEFAZOLIN SODIUM-DEXTROSE 2-4 GM/100ML-% IV SOLN
2.0000 g | Freq: Three times a day (TID) | INTRAVENOUS | Status: AC
  Administered 2021-01-23 (×2): 2 g via INTRAVENOUS
  Filled 2021-01-23 (×2): qty 100

## 2021-01-23 MED ORDER — HEPARIN SODIUM (PORCINE) 1000 UNIT/ML IJ SOLN
INTRAMUSCULAR | Status: DC | PRN
  Administered 2021-01-23: 5000 [IU]

## 2021-01-23 MED ORDER — PHENOL 1.4 % MT LIQD: 1.0000 | OROMUCOSAL | Status: DC | PRN

## 2021-01-23 MED ORDER — ALBUMIN HUMAN 5 % IV SOLN: INTRAVENOUS | Status: DC | PRN

## 2021-01-23 MED ORDER — CEFAZOLIN SODIUM-DEXTROSE 2-4 GM/100ML-% IV SOLN
INTRAVENOUS | Status: AC
  Filled 2021-01-23: qty 100

## 2021-01-23 MED ORDER — FENTANYL CITRATE (PF) 100 MCG/2ML IJ SOLN
INTRAMUSCULAR | Status: AC
  Filled 2021-01-23: qty 2

## 2021-01-23 MED ORDER — ACETAMINOPHEN 500 MG PO TABS: 1000.0000 mg | ORAL_TABLET | ORAL | Status: AC

## 2021-01-23 MED ORDER — ROCURONIUM BROMIDE 10 MG/ML (PF) SYRINGE
PREFILLED_SYRINGE | INTRAVENOUS | Status: AC
  Filled 2021-01-23: qty 10

## 2021-01-23 MED ORDER — ROCURONIUM BROMIDE 10 MG/ML (PF) SYRINGE
PREFILLED_SYRINGE | INTRAVENOUS | Status: DC | PRN
  Administered 2021-01-23: 70 mg via INTRAVENOUS
  Administered 2021-01-23: 20 mg via INTRAVENOUS
  Administered 2021-01-23: 30 mg via INTRAVENOUS
  Administered 2021-01-23: 20 mg via INTRAVENOUS

## 2021-01-23 MED ORDER — THROMBIN 20000 UNITS EX SOLR
CUTANEOUS | Status: DC | PRN
  Administered 2021-01-23: 20 mL via TOPICAL

## 2021-01-23 MED ORDER — ONDANSETRON HCL 4 MG/2ML IJ SOLN: 4.0000 mg | Freq: Four times a day (QID) | INTRAMUSCULAR | Status: DC | PRN

## 2021-01-23 MED ORDER — MIDAZOLAM HCL 2 MG/2ML IJ SOLN
INTRAMUSCULAR | Status: AC
  Filled 2021-01-23: qty 2

## 2021-01-23 MED ORDER — ACETAMINOPHEN 325 MG PO TABS: 650.0000 mg | ORAL_TABLET | ORAL | Status: DC | PRN

## 2021-01-23 MED ORDER — LACTATED RINGERS IV SOLN: INTRAVENOUS | Status: DC

## 2021-01-23 MED ORDER — ONDANSETRON HCL 4 MG/2ML IJ SOLN
INTRAMUSCULAR | Status: AC
  Filled 2021-01-23: qty 2

## 2021-01-23 MED ORDER — DEXAMETHASONE SODIUM PHOSPHATE 10 MG/ML IJ SOLN
INTRAMUSCULAR | Status: DC | PRN
  Administered 2021-01-23: 10 mg via INTRAVENOUS

## 2021-01-23 MED ORDER — CHLORHEXIDINE GLUCONATE CLOTH 2 % EX PADS: 6.0000 | MEDICATED_PAD | Freq: Once | CUTANEOUS | Status: DC

## 2021-01-23 MED ORDER — SODIUM CHLORIDE 0.9% FLUSH
3.0000 mL | Freq: Two times a day (BID) | INTRAVENOUS | Status: DC
  Administered 2021-01-23 (×2): 3 mL via INTRAVENOUS

## 2021-01-23 MED ORDER — LACTATED RINGERS IV SOLN: INTRAVENOUS | Status: DC | PRN

## 2021-01-23 MED ORDER — OXYCODONE HCL 5 MG PO TABS
10.0000 mg | ORAL_TABLET | ORAL | Status: DC | PRN
Start: 2021-01-23 — End: 2021-01-24
  Administered 2021-01-23 – 2021-01-24 (×7): 10 mg via ORAL
  Filled 2021-01-23 (×6): qty 2

## 2021-01-23 MED ORDER — PHENYLEPHRINE HCL-NACL 10-0.9 MG/250ML-% IV SOLN
INTRAVENOUS | Status: DC | PRN
  Administered 2021-01-23: 25 ug/min via INTRAVENOUS

## 2021-01-23 MED ORDER — SODIUM CHLORIDE 0.9% FLUSH: 3.0000 mL | INTRAVENOUS | Status: DC | PRN

## 2021-01-23 MED ORDER — BUPIVACAINE HCL (PF) 0.25 % IJ SOLN
INTRAMUSCULAR | Status: AC
  Filled 2021-01-23: qty 30

## 2021-01-23 MED ORDER — CEFAZOLIN SODIUM-DEXTROSE 2-4 GM/100ML-% IV SOLN: 2.0000 g | INTRAVENOUS | Status: DC

## 2021-01-23 MED ORDER — METHOCARBAMOL 1000 MG/10ML IJ SOLN
500.0000 mg | Freq: Four times a day (QID) | INTRAVENOUS | Status: DC | PRN
  Filled 2021-01-23: qty 5

## 2021-01-23 MED ORDER — AMISULPRIDE (ANTIEMETIC) 5 MG/2ML IV SOLN: 10.0000 mg | Freq: Once | INTRAVENOUS | Status: DC | PRN

## 2021-01-23 MED ORDER — CELECOXIB 200 MG PO CAPS
200.0000 mg | ORAL_CAPSULE | Freq: Two times a day (BID) | ORAL | Status: DC
  Administered 2021-01-23 – 2021-01-24 (×3): 200 mg via ORAL
  Filled 2021-01-23 (×3): qty 1

## 2021-01-23 MED ORDER — HYDROXYZINE HCL 50 MG/ML IM SOLN
50.0000 mg | Freq: Four times a day (QID) | INTRAMUSCULAR | Status: DC | PRN
  Administered 2021-01-23: 50 mg via INTRAMUSCULAR
  Filled 2021-01-23: qty 1

## 2021-01-23 MED ORDER — MENTHOL 3 MG MT LOZG: 1.0000 | LOZENGE | OROMUCOSAL | Status: DC | PRN

## 2021-01-23 MED ORDER — FENTANYL CITRATE (PF) 250 MCG/5ML IJ SOLN
INTRAMUSCULAR | Status: DC | PRN
  Administered 2021-01-23: 50 ug via INTRAVENOUS
  Administered 2021-01-23: 100 ug via INTRAVENOUS

## 2021-01-23 MED ORDER — ONDANSETRON HCL 4 MG/2ML IJ SOLN
INTRAMUSCULAR | Status: DC | PRN
  Administered 2021-01-23: 4 mg via INTRAVENOUS

## 2021-01-23 MED ORDER — GABAPENTIN 300 MG PO CAPS: 300.0000 mg | ORAL_CAPSULE | ORAL | Status: AC

## 2021-01-23 MED ORDER — DEXAMETHASONE 4 MG PO TABS
4.0000 mg | ORAL_TABLET | Freq: Four times a day (QID) | ORAL | Status: DC
  Administered 2021-01-23 – 2021-01-24 (×2): 4 mg via ORAL
  Filled 2021-01-23 (×2): qty 1

## 2021-01-23 MED ORDER — MORPHINE SULFATE (PF) 2 MG/ML IV SOLN
2.0000 mg | INTRAVENOUS | Status: DC | PRN
  Administered 2021-01-23 (×2): 2 mg via INTRAVENOUS
  Filled 2021-01-23 (×2): qty 1

## 2021-01-23 SURGICAL SUPPLY — 65 items
BASKET BONE COLLECTION (BASKET) ×2 IMPLANT
BENZOIN TINCTURE PRP APPL 2/3 (GAUZE/BANDAGES/DRESSINGS) ×2 IMPLANT
BLADE CLIPPER SURG (BLADE) IMPLANT
BONE FIBERS PLIAFX 5ML (Bone Implant) ×2 IMPLANT
BUR CARBIDE MATCH 3.0 (BURR) ×2 IMPLANT
CAGE LORD PSX 10X10X25 20D (Cage) ×4 IMPLANT
CANISTER SUCT 3000ML PPV (MISCELLANEOUS) ×2 IMPLANT
CLSR STERI-STRIP ANTIMIC 1/2X4 (GAUZE/BANDAGES/DRESSINGS) ×2 IMPLANT
CNTNR URN SCR LID CUP LEK RST (MISCELLANEOUS) ×1 IMPLANT
CONT SPEC 4OZ STRL OR WHT (MISCELLANEOUS) ×1
COVER BACK TABLE 60X90IN (DRAPES) ×2 IMPLANT
COVER WAND RF STERILE (DRAPES) ×2 IMPLANT
DERMABOND ADVANCED (GAUZE/BANDAGES/DRESSINGS) ×1
DERMABOND ADVANCED .7 DNX12 (GAUZE/BANDAGES/DRESSINGS) ×1 IMPLANT
DIFFUSER DRILL AIR PNEUMATIC (MISCELLANEOUS) IMPLANT
DRAPE C-ARM 42X72 X-RAY (DRAPES) ×4 IMPLANT
DRAPE C-ARMOR (DRAPES) IMPLANT
DRAPE LAPAROTOMY 100X72X124 (DRAPES) ×2 IMPLANT
DRAPE SURG 17X23 STRL (DRAPES) ×2 IMPLANT
DRSG OPSITE POSTOP 4X6 (GAUZE/BANDAGES/DRESSINGS) ×2 IMPLANT
DRSG OPSITE POSTOP 4X8 (GAUZE/BANDAGES/DRESSINGS) ×2 IMPLANT
DURAPREP 26ML APPLICATOR (WOUND CARE) ×2 IMPLANT
ELECT REM PT RETURN 9FT ADLT (ELECTROSURGICAL) ×2
ELECTRODE REM PT RTRN 9FT ADLT (ELECTROSURGICAL) ×1 IMPLANT
EVACUATOR 1/8 PVC DRAIN (DRAIN) ×2 IMPLANT
GAUZE 4X4 16PLY RFD (DISPOSABLE) IMPLANT
GLOVE BIO SURGEON STRL SZ7 (GLOVE) ×4 IMPLANT
GLOVE BIO SURGEON STRL SZ8 (GLOVE) ×4 IMPLANT
GLOVE SRG 8 PF TXTR STRL LF DI (GLOVE) ×3 IMPLANT
GLOVE SURG UNDER POLY LF SZ7 (GLOVE) ×8 IMPLANT
GLOVE SURG UNDER POLY LF SZ7.5 (GLOVE) ×8 IMPLANT
GLOVE SURG UNDER POLY LF SZ8 (GLOVE) ×3
GOWN STRL REUS W/ TWL LRG LVL3 (GOWN DISPOSABLE) ×1 IMPLANT
GOWN STRL REUS W/ TWL XL LVL3 (GOWN DISPOSABLE) ×2 IMPLANT
GOWN STRL REUS W/TWL 2XL LVL3 (GOWN DISPOSABLE) ×2 IMPLANT
GOWN STRL REUS W/TWL LRG LVL3 (GOWN DISPOSABLE) ×1
GOWN STRL REUS W/TWL XL LVL3 (GOWN DISPOSABLE) ×2
HEMOSTAT POWDER KIT SURGIFOAM (HEMOSTASIS) ×2 IMPLANT
KIT BASIN OR (CUSTOM PROCEDURE TRAY) ×2 IMPLANT
KIT BONE MRW ASP ANGEL CPRP (KITS) ×2 IMPLANT
KIT TURNOVER KIT B (KITS) ×2 IMPLANT
MILL MEDIUM DISP (BLADE) ×2 IMPLANT
NEEDLE HYPO 25X1 1.5 SAFETY (NEEDLE) ×2 IMPLANT
NS IRRIG 1000ML POUR BTL (IV SOLUTION) ×2 IMPLANT
PACK LAMINECTOMY NEURO (CUSTOM PROCEDURE TRAY) ×2 IMPLANT
PAD ARMBOARD 7.5X6 YLW CONV (MISCELLANEOUS) ×6 IMPLANT
ROD LORD LIPPED TI 5.5X70 (Rod) ×4 IMPLANT
SCREW CORT SHANK MOD 6.5X40 (Screw) ×12 IMPLANT
SCREW POLYAXIAL TULIP (Screw) ×12 IMPLANT
SET SCREW (Screw) ×6 IMPLANT
SET SCREW SPNE (Screw) ×6 IMPLANT
SPACER PEEK PS 25X8MM 9MM 5DEG (Spacer) ×2 IMPLANT
SPACER PEEK PS 25X9MM 9MM 5DEG (Spacer) ×2 IMPLANT
SPONGE LAP 4X18 RFD (DISPOSABLE) IMPLANT
SPONGE SURGIFOAM ABS GEL 100 (HEMOSTASIS) ×2 IMPLANT
STRIP CLOSURE SKIN 1/2X4 (GAUZE/BANDAGES/DRESSINGS) ×4 IMPLANT
SUT VIC AB 0 CT1 18XCR BRD8 (SUTURE) ×2 IMPLANT
SUT VIC AB 0 CT1 8-18 (SUTURE) ×2
SUT VIC AB 2-0 CP2 18 (SUTURE) ×4 IMPLANT
SUT VIC AB 3-0 SH 8-18 (SUTURE) ×4 IMPLANT
SYR CONTROL 10ML LL (SYRINGE) ×2 IMPLANT
TOWEL GREEN STERILE (TOWEL DISPOSABLE) ×2 IMPLANT
TOWEL GREEN STERILE FF (TOWEL DISPOSABLE) ×2 IMPLANT
TRAY FOLEY MTR SLVR 16FR STAT (SET/KITS/TRAYS/PACK) ×2 IMPLANT
WATER STERILE IRR 1000ML POUR (IV SOLUTION) ×2 IMPLANT

## 2021-01-23 NOTE — Anesthesia Postprocedure Evaluation (Signed)
Anesthesia Post Note  Patient: Courtney Crane  Procedure(s) Performed: POSTERIOR LUMBAR INTERBODY FUSION - LUMBAR THREE-LUMBAR FOUR - LUMBAR FOUR-LUMBAR FIVE (N/A Back)     Patient location during evaluation: PACU Anesthesia Type: General Level of consciousness: awake Pain management: pain level controlled Vital Signs Assessment: post-procedure vital signs reviewed and stable Respiratory status: spontaneous breathing, nonlabored ventilation, respiratory function stable and patient connected to nasal cannula oxygen Cardiovascular status: blood pressure returned to baseline and stable Postop Assessment: no apparent nausea or vomiting Anesthetic complications: no   No complications documented.  Last Vitals:  Vitals:   01/23/21 1314 01/23/21 1638  BP: (!) 114/54 118/61  Pulse: 61 (!) 58  Resp: 18 18  Temp: (!) 36.4 C 36.7 C  SpO2: 99% 99%    Last Pain:  Vitals:   01/23/21 1638  TempSrc: Oral  PainSc:                  [name] P Deanza Upperman

## 2021-01-23 NOTE — H&P (Signed)
Subjective: Patient is a 70 y.o. female admitted for leg pain. Onset of symptoms was several months ago, gradually worsening since that time.  The pain is rated severe, unremitting, and is located at the across the lower back and radiates to legs. The pain is described as aching and occurs all day. The symptoms have been progressive. Symptoms are exacerbated by exercise and standing. MRI or CT showed spondylolisthesis with stenosis   Past Medical History:  Diagnosis Date  . Anemia   . Anxiety   . Arthritis   . Asthma   . Cataract   . Colon polyps   . Complication of anesthesia    "sometimes I dont like to wake up after " ie difficult to awaken   . Diverticulitis   . Diverticulosis   . Fatty liver   . GERD (gastroesophageal reflux disease)   . HLD (hyperlipidemia)   . HTN (hypertension)   . IBS (irritable bowel syndrome)   . Insomnia   . MRSA infection   . Osteoarthritis   . Osteopenia   . PONV (postoperative nausea and vomiting)   . RA (rheumatoid arthritis) ([place])   . Rheumatoid arthritis ([place])   . Seasonal allergies   . Shingles   . Status post dilation of esophageal narrowing   . Vitamin D deficiency     Past Surgical History:  Procedure Laterality Date  . APPENDECTOMY  1975  . bladder tack  1990  . CATARACT EXTRACTION, BILATERAL Bilateral   . CHOLECYSTECTOMY N/A 01/31/2017   Procedure: LAPAROSCOPIC CHOLECYSTECTOMY WITH INTRAOPERATIVE CHOLANGIOGRAM;  Surgeon: Armandina Gemma, MD;  Location: [facility];  Service: General;  Laterality: N/A;  . INCISION AND DRAINAGE PERIRECTAL ABSCESS N/A 12/02/2017   Procedure: IRRIGATION AND DEBRIDEMENT PERINEAL ABSCESS;  Surgeon: Greer Pickerel, MD;  Location: [place];  Service: General;  Laterality: N/A;  . MENISCUS REPAIR Right 2013  . ROTATOR CUFF REPAIR Right 2005  . TONSILLECTOMY     as a child  . TOTAL ABDOMINAL HYSTERECTOMY  1988  . TOTAL KNEE ARTHROPLASTY Left   . WRIST RECONSTRUCTION Right 2008    Prior to Admission medications    Medication Sig Start Date End Date Taking? Authorizing Provider  acetaminophen (TYLENOL) 500 MG tablet Take 1,000 mg by mouth every 6 (six) hours as needed for moderate pain.   Yes [provider]  amLODipine (NORVASC) 10 MG tablet Take 10 mg by mouth daily.    Yes [provider]  Ascorbic Acid (VITAMIN C) 1000 MG tablet Take 1,000 mg by mouth daily.   Yes [provider]  aspirin EC 81 MG tablet Take 81 mg by mouth daily. Swallow whole.   Yes [provider]  Calcium Citrate (CITRACAL PO) Take 1 tablet by mouth daily.   Yes [provider]  CANNABIDIOL PO Take 2 capsules by mouth at bedtime.   Yes [provider]  Cholecalciferol (VITAMIN D) 50 MCG (2000 UT) CAPS Take 2,000 Units by mouth daily.   Yes [provider]  fexofenadine (ALLEGRA) 180 MG tablet Take 180 mg by mouth daily as needed for allergies.   Yes [provider]  hydrochlorothiazide (HYDRODIURIL) 25 MG tablet Take 25 mg by mouth daily.   Yes [provider]  losartan (COZAAR) 100 MG tablet Take 100 mg by mouth daily.   Yes [provider]  MELATONIN PO Take 1.5 mg by mouth at bedtime as needed (sleep).   Yes [provider]  meloxicam (MOBIC) 15 MG tablet Take 15  mg by mouth daily.   Yes [provider]  Multiple Vitamin (MULTIVITAMIN) tablet Take 1 tablet by mouth daily.   Yes [provider]  Omega-3 Fatty Acids (FISH OIL) 1200 MG CAPS Take 1,200 mg by mouth 2 (two) times daily.   Yes [provider]  omeprazole (PRILOSEC) 40 MG capsule Take 1 capsule (40 mg total) by mouth daily before breakfast. Take one pill 20-30 minutes before breakfast meal. 01/18/21  Yes Milus Banister, MD  polycarbophil (FIBERCON) 625 MG tablet Take 625 mg by mouth daily.   Yes [provider]  polyethylene glycol (MIRALAX / GLYCOLAX) 17 g packet Take 17 g by mouth every Monday, Wednesday, and Friday.   Yes [provider]  Ascorbic Acid (VITAMIN C PO) Take 600 mg by mouth daily. Patient not taking: No sig reported    [provider]  Cholecalciferol (VITAMIN D-3) 125 MCG (5000 UT) TABS Take 1 tablet by mouth daily. Patient not taking: No sig reported    [provider]  Coral Calcium 1000 (390 Ca) MG TABS Take by mouth. Patient not taking: No sig reported    [provider]  folic acid (FOLVITE) 1 MG tablet Take 2 mg by mouth daily.    [provider]  inFLIXimab-abda (RENFLEXIS) 100 MG SOLR Inject 1 each into the vein every 8 (eight) weeks.    [provider]  NON FORMULARY Place under the tongue at bedtime. CBD Oil- nightly Patient not taking: No sig reported    [provider]  PSYLLIUM PO Take 1 packet by mouth daily. Fiber Patient not taking: Reported on 01/12/2021    [provider]  valACYclovir (VALTREX) 500 MG tablet Take 500 mg by mouth daily as needed.    [provider]   Allergies  Allergen Reactions  . Demerol [Meperidine] Nausea Only  . Nickel Rash  . Tape Rash    Social History   Tobacco Use  . Smoking status: Never Smoker  . Smokeless tobacco: Never Used  Substance Use Topics  . Alcohol use: Yes    Alcohol/week: 7.0 standard drinks    Types: 7 Standard drinks or equivalent per week    Comment: wine daily    Family History  Problem Relation Age of Onset  . Colon polyps Mother   . Diabetes Mother   . Heart disease Mother   . Kidney cancer Mother   . Arthritis Mother   . Hypertension Mother   . Hyperlipidemia Mother   . Colon polyps Son   . CAD Brother   . Esophageal cancer Brother   . Dementia Father   . Colon cancer Neg Hx   . Stomach cancer Neg Hx   . Rectal cancer Neg Hx   . Prostate cancer Neg Hx      Review of Systems  Positive ROS: neg  All other systems have been reviewed and were otherwise negative with the exception of those mentioned in the HPI and as  above.  Objective: Vital signs in last 24 hours: Temp:  [97.3 F (36.3 C)] 97.3 F (36.3 C) (03/07 0605) Pulse Rate:  [70] 70 (03/07 0605) Resp:  [18] 18 (03/07 0605) BP: (138)/(62) 138/62 (03/07 0605) SpO2:  [100 %] 100 % (03/07 0605) Weight:  [69.9 kg] 69.9 kg (03/07 0605)  General Appearance: Alert, cooperative, no distress, appears stated age Head: Normocephalic, without obvious abnormality, atraumatic Eyes: PERRL, conjunctiva/corneas clear, EOM's intact    Neck: Supple, symmetrical, trachea midline  Back: Symmetric, no curvature, ROM normal, no CVA tenderness Lungs:  respirations unlabored Heart: Regular rate and rhythm Abdomen: Soft, non-tender Extremities: Extremities normal, atraumatic, no cyanosis or edema Pulses: 2+ and symmetric all extremities Skin: Skin color, texture, turgor normal, no rashes or lesions  NEUROLOGIC:   Mental status: Alert and oriented x4,  no aphasia, good attention span, fund of knowledge, and memory Motor Exam - grossly normal Sensory Exam - grossly normal Reflexes: 1= Coordination - grossly normal Gait - grossly normal Balance - grossly normal Cranial Nerves: I: smell Not tested  II: visual acuity  OS: nl    OD: nl  II: visual fields Full to confrontation  II: pupils Equal, round, reactive to light  III,VII: ptosis None  III,IV,VI: extraocular muscles  Full ROM  V: mastication Normal  V: facial light touch sensation  Normal  V,VII: corneal reflex  Present  VII: facial muscle function - upper  Normal  VII: facial muscle function - lower Normal  VIII: hearing Not tested  IX: soft palate elevation  Normal  IX,X: gag reflex Present  XI: trapezius strength  5/5  XI: sternocleidomastoid strength 5/5  XI: neck flexion strength  5/5  XII: tongue strength  Normal    Data Review Lab Results  Component Value Date   WBC 8.1 01/19/2021   HGB 14.7 01/19/2021   HCT 42.2 01/19/2021   MCV 95.5 01/19/2021   PLT 184 01/19/2021   Lab  Results  Component Value Date   NA 136 01/19/2021   K 3.3 (L) 01/19/2021   CL 98 01/19/2021   CO2 24 01/19/2021   BUN 20 01/19/2021   CREATININE 0.85 01/19/2021   GLUCOSE 89 01/19/2021   Lab Results  Component Value Date   INR 0.9 01/19/2021    Assessment/Plan:  Estimated body mass index is 27.28 kg/m as calculated from the following:   Height as of this encounter: 5\' 3"  (1.6 m).   Weight as of this encounter: 69.9 kg. Patient admitted for PLIF L3-4 l4-5. Patient has failed a reasonable attempt at conservative therapy.  I explained the condition and procedure to the patient and answered any questions.  Patient wishes to proceed with procedure as planned. Understands risks/ benefits and typical outcomes of procedure.   Eustace Moore 01/23/2021 7:16 AM

## 2021-01-23 NOTE — Op Note (Signed)
01/23/2021  11:31 AM  PATIENT:  Courtney Crane  70 y.o. 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 Decompressive lumbar laminectomy hemifacetectomy foraminotomies L3-4 L4-5 requiring more work than would be required for a simple exposure of the disk for PLIF in order to adequately decompress the neural elements and address the spinal stenosis, with removal of the posterior elements to perform posterior osteotomies to correct sagittal and coronal balance 2. Posterior lumbar interbody fusion L3-4 L4-5 using peek interbody cages at L3-4 packed with morcellized allograft and autograft soaked with a bone marrow aspirate obtained through a separate fascial incision over the right iliac crest, and PSX expandable cages at L4-5 3. Posterior fixation L3-L5 inclusive using Alphatec cortical pedicle screws.  4. Intertransverse arthrodesis L3-L5 using morcellized autograft and allograft.  SURGEON:  Sherley Bounds, MD  ASSISTANTS: Margo Aye FNP  ANESTHESIA:  General  EBL: 150 ml  Total I/O In: 1950 [I.V.:1700; IV Piggyback:250] Out: 255 [Urine:105; Blood:150]  BLOOD ADMINISTERED:none  DRAINS: none   INDICATION FOR PROCEDURE: This patient presented with back and leg pain. Imaging revealed short segment scoliosis at L3-4 and L4-5 with degenerative disc disease at L3-4 the flattening of the segment and a grade 1-2 anterior listhesis L4-5.  There is severe spinal stenosis at both levels.. The patient tried a reasonable attempt at conservative medical measures without relief. I recommended decompression and instrumented fusion to address the stenosis as well as the segmental  instability.  Patient understood the risks, benefits, and alternatives and potential outcomes and wished to proceed.  PROCEDURE DETAILS:  The  patient was brought to the operating room. After induction of generalized endotracheal anesthesia the patient was rolled into the prone position on chest rolls and all pressure points were padded. The patient's lumbar region was cleaned and then prepped with DuraPrep and draped in the usual sterile fashion. Anesthesia was injected and then a dorsal midline incision was made and carried down to the lumbosacral fascia. The fascia was opened and the paraspinous musculature was taken down in a subperiosteal fashion to expose L3-4 and L4-5. A self-retaining retractor was placed. Intraoperative fluoroscopy confirmed my level, and I started with placement of the L3 cortical pedicle screws. The pedicle screw entry zones were identified utilizing surface landmarks and  AP and lateral fluoroscopy. I scored the cortex with the high-speed drill and then used the hand drill to drill an upward and outward direction into the pedicle. I then tapped line to line. I then placed a 6.5 x 40 mm cortical pedicle screw into the pedicles of L3 bilaterally.  I then dissected in a suprafascial plane to expose the iliac crest.  Opened the fascia and we used a Jamshidi needle to extract 60 cc of bone marrow aspirate from the iliac crest with the assistance of my nurse practitioner.  This was then spun down by [facility] device and 2 to 4 cc of  BMAC was soaked on morselized allograft for later arthrodesis.  I dried the hole with Surgifoam and closed the fascia.  I then turned my attention to the decompression and complete lumbar laminectomies, hemi- facetectomies, and foraminotomies were performed at L3-4 and L4-5.  Removal of the posterior elements was done to perform osteotomies at both levels to help achieve correction.  My nurse practitioner was directly involved in the decompression and exposure of the  neural elements. the patient had significant spinal stenosis and this required more work than would be required for a simple exposure of the  disc for posterior lumbar interbody fusion which would only require a limited laminotomy. Much more generous decompression and generous foraminotomy was undertaken in order to adequately decompress the neural elements and address the patient's leg pain. The yellow ligament was removed to expose the underlying dura and nerve roots, and generous foraminotomies were performed to adequately decompress the neural elements. Both the exiting and traversing nerve roots were decompressed on both sides until a coronary dilator passed easily along the nerve roots. Once the decompression was complete, I turned my attention to the posterior lower lumbar interbody fusion. The epidural venous vasculature was coagulated and cut sharply. Disc space was incised and the initial discectomy was performed with pituitary rongeurs. The disc space was distracted with sequential distractors to a height of 8 mm at L3-4 and 10 mm at L4-5. We then used a series of scrapers and shavers to prepare the endplates for fusion. The midline was prepared with Epstein curettes. Once the complete discectomy was finished, we packed an appropriate sized interbody cage with local autograft and morcellized allograft, gently retracted the nerve root, and tapped the cage into position at L3-4 and L4-5.  Peek interbody cages were used at L3-4 and PSX expandable cages were used at L4-5 and expanded to 15 degrees. the midline between the cages was packed with morselized autograft and allograft. We then turned our attention to the placement of the lower pedicle screws. The pedicle screw entry zones were identified utilizing surface landmarks and fluoroscopy. I drilled into each pedicle utilizing the hand drill, and tapped each pedicle with the appropriate tap. We palpated with a ball probe to assure no break in the cortex. We then placed 6.5 x 40 mm pedicle screws into the pedicles bilaterally at L4 and L5.  My nurse practitioner assisted in placement of the  pedicle screws.  We then decorticated the transverse processes and laid a mixture of morcellized autograft and allograft out over these to perform intertransverse arthrodesis at L3-L5 inclusive. We then placed lordotic rods into the multiaxial screw heads of the pedicle screws and locked these in position with the locking caps and anti-torque device. We then checked our construct with AP and lateral fluoroscopy. Irrigated with copious amounts of bacitracin-containing saline solution. Inspected the nerve roots once again to assure adequate decompression, lined to the dura with Gelfoam, placed powdered vancomycin into the wound, and then we closed the muscle and the fascia with 0 Vicryl. Closed the subcutaneous tissues with 2-0 Vicryl and subcuticular tissues with 3-0 Vicryl. The skin was closed with benzoin and Steri-Strips. Dressing was then applied, the patient was awakened from general anesthesia and transported to the recovery room in stable condition. At the end of the procedure all sponge, needle and instrument counts were correct.   PLAN OF CARE: admit to inpatient  PATIENT DISPOSITION:  PACU - hemodynamically stable.   Delay start of Pharmacological VTE agent (>24hrs) due to surgical blood loss or risk of bleeding:  yes

## 2021-01-23 NOTE — Transfer of Care (Signed)
Immediate Anesthesia Transfer of Care Note  Patient: Courtney Crane  Procedure(s) Performed: POSTERIOR LUMBAR INTERBODY FUSION - LUMBAR THREE-LUMBAR FOUR - LUMBAR FOUR-LUMBAR FIVE (N/A Back)  Patient Location: PACU  Anesthesia Type:General  Level of Consciousness: drowsy  Airway & Oxygen Therapy: Patient connected to face mask oxygen  Post-op Assessment: Report given to RN and Post -op Vital signs reviewed and stable  Post vital signs: Reviewed and stable  Last Vitals:  Vitals Value Taken Time  BP 93/49 01/23/21 1130  Temp    Pulse 58 01/23/21 1133  Resp 12 01/23/21 1133  SpO2 95 % 01/23/21 1133  Vitals shown include unvalidated device data.  Last Pain:  Vitals:   01/23/21 0707  TempSrc:   PainSc: 3       Patients Stated Pain Goal: 2 (40/35/24 8185)  Complications: No complications documented.

## 2021-01-23 NOTE — Anesthesia Procedure Notes (Addendum)
Procedure Name: Intubation Date/Time: 01/23/2021 7:43 AM Performed by: Bryson Corona, CRNA Pre-anesthesia Checklist: Patient identified, Emergency Drugs available, Suction available, Patient being monitored and Timeout performed Patient Re-evaluated:Patient Re-evaluated prior to induction Oxygen Delivery Method: Circle System Utilized Preoxygenation: Pre-oxygenation with 100% oxygen Induction Type: IV induction Ventilation: Mask ventilation without difficulty and Oral airway inserted - appropriate to patient size Laryngoscope Size: 2 and Miller Grade View: Grade I Tube type: Oral Tube size: 7.5 mm Number of attempts: 1 Airway Equipment and Method: Stylet and Oral airway Placement Confirmation: ETT inserted through vocal cords under direct vision,  positive ETCO2 and breath sounds checked- equal and bilateral Secured at: 22 cm Tube secured with: Tape Dental Injury: Teeth and Oropharynx as per pre-operative assessment  Comments: Performed by Lady Deutscher

## 2021-01-24 MED ORDER — METHOCARBAMOL 500 MG PO TABS: 500.0000 mg | ORAL_TABLET | Freq: Four times a day (QID) | ORAL | 0 refills | Status: DC

## 2021-01-24 MED ORDER — OXYCODONE-ACETAMINOPHEN 5-325 MG PO TABS: 1.0000 | ORAL_TABLET | ORAL | 0 refills | Status: DC | PRN

## 2021-01-24 NOTE — Discharge Instructions (Signed)
Wound Care Keep incision covered and dry for 2 days  Do not put any creams, lotions, or ointments on incision. Leave steri-strips on back.  They will fall off by themselves. Activity Walk each and every day, increasing distance each day. No lifting greater than 5 lbs.  Avoid bending, arching, or twisting. No driving for 2 weeks; may ride as a passenger locally. If provided with back brace, wear when out of bed.  It is not necessary to wear in bed. Diet Resume your normal diet.  Return to Work Will be discussed at you follow up appointment. Call Your Doctor If Any of These Occur Redness, drainage, or swelling at the wound.  Temperature greater than 101 degrees. Severe pain not relieved by pain medication. Incision starts to come apart. Follow Up Appt Call today for appointment in 1-2 weeks [phone]) or for problems.  If you have any hardware placed in your spine, you will need an x-ray before your appointment.

## 2021-01-24 NOTE — Evaluation (Signed)
Occupational Therapy Evaluation Patient Details Name: Courtney Crane MRN: [id number] DOB: 12-19-1950 Today's Date: 01/24/2021    History of Present Illness Pt is a 70 y/o female who presents s/p L3-L5 PLIF on 01/23/2021. PMH signficant for esophageal narrowing s/p dilation, shingles, RA, osteopenia, OA, HTN, diverticulosis, diverticulitis, R wrist reconstruction, L TKR, R RCR.   Clinical Impression   PTA patient independent and driving. Admitted for above and limited by problem list below, including back precautions, pain, and impaired balance.  Patient educated on back precautions, brace mgmt and wear schedule, activity progression, ADl compensatory techniques, DME and recommendations.  Patient currently requires min guard to supervision for ADLs, min guard fading to supervision for transfers and min guard for in room mobility using no AD (per pt request today, but plans to use RW as needed).  Patient will have good support of spouse at dc, voices understanding with recommendations of DME, compensatory techniques and precautions.  Based on performance today, no further OT needs have been identified and OT will sign off.  Thank you for this referral.     Follow Up Recommendations  No OT follow up;Supervision - Intermittent    Equipment Recommendations  None recommended by OT    Recommendations for Other Services       Precautions / Restrictions Precautions Precautions: Fall;Back Precaution Booklet Issued: Yes (comment) Precaution Comments: Reviewed handout and pt was cued for precautions during ADLS Required Braces or Orthoses: Spinal Brace Spinal Brace: Lumbar corset;Applied in sitting position Restrictions Weight Bearing Restrictions: No      Mobility Bed Mobility Overal bed mobility: Needs Assistance Bed Mobility: Rolling;Sidelying to Sit;Sit to Sidelying Rolling: Supervision Sidelying to sit: Supervision     Sit to sidelying: Supervision General bed mobility comments:  supervision for safety, good technique and recall of log roll    Transfers Overall transfer level: Needs assistance Equipment used: None Transfers: Sit to/from Stand Sit to Stand: Min guard;Supervision         General transfer comment: min guard fading to supervision for safety, cueing for hand placement and posture    Balance Overall balance assessment: Needs assistance Sitting-balance support: No upper extremity supported;Feet supported Sitting balance-Leahy Scale: Good     Standing balance support: No upper extremity supported;During functional activity Standing balance-Leahy Scale: Fair Standing balance comment: preference to UE support                           ADL either performed or assessed with clinical judgement   ADL Overall ADL's : Needs assistance/impaired     Grooming: Supervision/safety;Standing   Upper Body Bathing: Supervision/ safety;Set up;Sitting   Lower Body Bathing: Supervison/ safety;Sit to/from stand   Upper Body Dressing : Supervision/safety;Sitting   Lower Body Dressing: Supervision/safety;Sit to/from stand;Adhering to back precautions;Cueing for compensatory techniques   Toilet Transfer: Min guard;Ambulation;Regular Toilet;Grab bars   Toileting- Clothing Manipulation and Hygiene: Supervision/safety;Set up;Sit to/from stand;Sitting/lateral lean   Tub/ Shower Transfer: Walk-in shower;Supervision/safety;Ambulation;Shower Scientist, research (medical) Details (indicate cue type and reason): simulated Functional mobility during ADLs: Min guard;Supervision/safety;Cueing for safety General ADL Comments: pt educated on precautions, safety and ADl compesantory techniques; able to don/doff brace with supervision     Vision         Perception     Praxis      Pertinent Vitals/Pain Pain Assessment: Faces Faces Pain Scale: Hurts a little bit Pain Location: back, incisional Pain Descriptors / Indicators: Operative site guarding Pain  Intervention(s): Limited activity  within patient's tolerance;Monitored during session;Repositioned     Hand Dominance     Extremity/Trunk Assessment Upper Extremity Assessment Upper Extremity Assessment: Overall WFL for tasks assessed   Lower Extremity Assessment Lower Extremity Assessment: Defer to PT evaluation   Cervical / Trunk Assessment Cervical / Trunk Assessment: Other exceptions Cervical / Trunk Exceptions: s/p back surgery   Communication Communication Communication: No difficulties   Cognition Arousal/Alertness: Awake/alert Behavior During Therapy: WFL for tasks assessed/performed Overall Cognitive Status: Within Functional Limits for tasks assessed                                     General Comments  mobility to/from restroom without RW, min guard for safety pt guarded    Exercises     Shoulder Instructions      Home Living Family/patient expects to be discharged to:: Private residence Living Arrangements: Spouse/significant other Available Help at Discharge: Family;Available 24 hours/day Type of Home: House Home Access: Stairs to enter [employer] of Steps: 4   Home Layout: One level     Bathroom Shower/Tub: Walk-in shower         Home Equipment: Environmental consultant - 2 wheels;Cane - single point;Bedside commode;Shower seat - built in          Prior Functioning/Environment Level of Independence: Independent                 OT Problem List: Decreased activity tolerance;Impaired balance (sitting and/or standing);Decreased safety awareness;Decreased knowledge of use of DME or AE;Decreased knowledge of precautions;Pain      OT Treatment/Interventions:      OT Goals(Current goals can be found in the care plan section) Acute Rehab OT Goals Patient Stated Goal: home today OT Goal Formulation: With patient  OT Frequency:     Barriers to D/C:            Co-evaluation              AM-PAC OT "6 Clicks" Daily  Activity     Outcome Measure Help from another person eating meals?: None Help from another person taking care of personal grooming?: None Help from another person toileting, which includes using toliet, bedpan, or urinal?: A Little Help from another person bathing (including washing, rinsing, drying)?: A Little Help from another person to put on and taking off regular upper body clothing?: A Little Help from another person to put on and taking off regular lower body clothing?: A Little 6 Click Score: 20   End of Session Equipment Utilized During Treatment: Gait belt;Back brace Nurse Communication: Mobility status  Activity Tolerance: Patient tolerated treatment well Patient left: with call bell/phone within reach;Other (comment);with family/visitor present (seated EOB)  OT Visit Diagnosis: Other abnormalities of gait and mobility (R26.89);Muscle weakness (generalized) (M62.81);Pain Pain - part of body:  (back)                Time: 8937-3428 OT Time Calculation (min): 27 min Charges:  OT General Charges $OT Visit: 1 Visit OT Evaluation $OT Eval Moderate Complexity: 1 Mod OT Treatments $Self Care/Home Management : 8-22 mins  Jolaine Artist, OT Acute Rehabilitation Services Pager [phone] Office [phone]   Delight Stare 01/24/2021, 11:30 AM

## 2021-01-24 NOTE — Evaluation (Signed)
Physical Therapy Evaluation Patient Details Name: Courtney Crane MRN: [id number] DOB: [birth year] Today's Date: 01/24/2021   History of Present Illness  Pt is a 70 y/o female who presents s/p L3-L5 PLIF on 01/23/2021. PMH signficant for esophageal narrowing s/p dilation, shingles, RA, osteopenia, OA, HTN, diverticulosis, diverticulitis, R wrist reconstruction, L TKR, R RCR.  Clinical Impression  Pt admitted with above diagnosis. At the time of PT eval, pt was able to demonstrate transfers and ambulation with gross supervision for safety and SPC progressing to no AD. Pt was educated on precautions, brace application/wearing schedule, appropriate activity progression, and car transfer. Pt currently with functional limitations due to the deficits listed below (see PT Problem List). Pt will benefit from skilled PT to increase their independence and safety with mobility to allow discharge to the venue listed below.      Follow Up Recommendations No PT follow up;Supervision - Intermittent    Equipment Recommendations  None recommended by PT    Recommendations for Other Services       Precautions / Restrictions Precautions Precautions: Fall;Back Precaution Booklet Issued: Yes (comment) Precaution Comments: Reviewed handout and pt was cued for precautions during ADLS Required Braces or Orthoses: Spinal Brace Spinal Brace: Lumbar corset;Applied in sitting position Restrictions Weight Bearing Restrictions: No      Mobility  Bed Mobility Overal bed mobility: Needs Assistance Bed Mobility: Rolling;Sidelying to Sit;Sit to Sidelying Rolling: Supervision Sidelying to sit: Supervision     Sit to sidelying: Supervision General bed mobility comments: supervision for safety, good technique and recall of log roll    Transfers Overall transfer level: Needs assistance Equipment used: None Transfers: Sit to/from Stand Sit to Stand: Min guard;Supervision         General transfer comment:  min guard fading to supervision for safety, cueing for hand placement and posture  Ambulation/Gait Ambulation/Gait assistance: Supervision Gait Distance (Feet): 300 Feet Assistive device: Straight cane;None Gait Pattern/deviations: Step-through pattern;Decreased stride length;Trunk flexed Gait velocity: Decreased Gait velocity interpretation: <1.8 ft/sec, indicate of risk for recurrent falls General Gait Details: Slow and guarded. SPC initially progressing to no AD by end of session.  Stairs Stairs: Yes Stairs assistance: Min guard Stair Management: One rail Left;Step to pattern;Forwards Number of Stairs: 3 General stair comments: Reinforced "up with the good, down with the bad" as pt with history of knee replacement and still considers that her "weaker" leg.  Wheelchair Mobility    Modified Rankin (Stroke Patients Only)       Balance Overall balance assessment: Needs assistance Sitting-balance support: No upper extremity supported;Feet supported Sitting balance-Leahy Scale: Good     Standing balance support: No upper extremity supported;During functional activity Standing balance-Leahy Scale: Fair Standing balance comment: preference to UE support                             Pertinent Vitals/Pain Pain Assessment: Faces Faces Pain Scale: Hurts a little bit Pain Location: back, incisional Pain Descriptors / Indicators: Operative site guarding Pain Intervention(s): Limited activity within patient's tolerance;Monitored during session;Repositioned    Home Living Family/patient expects to be discharged to:: Private residence Living Arrangements: Spouse/significant other Available Help at Discharge: Family;Available 24 hours/day Type of Home: House Home Access: Stairs to enter   [employer] of Steps: 4 Home Layout: One level Home Equipment: Walker - 2 wheels;Cane - single point;Bedside commode;Shower seat - built in      Prior Function Level of  Independence: Independent  Hand Dominance        Extremity/Trunk Assessment   Upper Extremity Assessment Upper Extremity Assessment: Overall WFL for tasks assessed    Lower Extremity Assessment Lower Extremity Assessment: Defer to PT evaluation    Cervical / Trunk Assessment Cervical / Trunk Assessment: Other exceptions Cervical / Trunk Exceptions: s/p back surgery  Communication   Communication: No difficulties  Cognition Arousal/Alertness: Awake/alert Behavior During Therapy: WFL for tasks assessed/performed Overall Cognitive Status: Within Functional Limits for tasks assessed                                        General Comments General comments (skin integrity, edema, etc.): mobility to/from restroom without RW, min guard for safety pt guarded    Exercises     Assessment/Plan    PT Assessment Patient needs continued PT services  PT Problem List Decreased strength;Decreased activity tolerance;Decreased balance;Decreased mobility;Decreased knowledge of use of DME;Decreased safety awareness;Decreased knowledge of precautions;Pain       PT Treatment Interventions DME instruction;Gait training;Stair training;Functional mobility training;Therapeutic activities;Therapeutic exercise;Neuromuscular re-education;Patient/family education    PT Goals (Current goals can be found in the Care Plan section)  Acute Rehab PT Goals Patient Stated Goal: home today PT Goal Formulation: With patient/family Time For Goal Achievement: 01/31/21 Potential to Achieve Goals: Good    Frequency Min 5X/week   Barriers to discharge        Co-evaluation               AM-PAC PT "6 Clicks" Mobility  Outcome Measure Help needed turning from your back to your side while in a flat bed without using bedrails?: None Help needed moving from lying on your back to sitting on the side of a flat bed without using bedrails?: None Help needed moving to and  from a bed to a chair (including a wheelchair)?: None Help needed standing up from a chair using your arms (e.g., wheelchair or bedside chair)?: None Help needed to walk in hospital room?: None Help needed climbing 3-5 steps with a railing? : None 6 Click Score: 24    End of Session Equipment Utilized During Treatment: Gait belt Activity Tolerance: Patient tolerated treatment well Patient left: in bed;with call bell/phone within reach;with family/visitor present Nurse Communication: Mobility status PT Visit Diagnosis: Unsteadiness on feet (R26.81);Pain Pain - part of body:  (back)    Time: 8675-4492 PT Time Calculation (min) (ACUTE ONLY): 19 min   Charges:   PT Evaluation $PT Eval Low Complexity: 1 Low          Rolinda Roan, PT, DPT Acute Rehabilitation Services Pager: [phone] Office: [phone]   Thelma Comp 01/24/2021, 12:58 PM

## 2021-01-24 NOTE — Discharge Summary (Signed)
Physician Discharge Summary  Patient ID: Courtney Crane MRN: [id number] DOB/AGE: January 02, 1951 70 y.o.  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 The patient tolerated the procedure well and was taken to the recovery room and then to the floor in stable condition. The hospital course was routine. There were no complications. The wound remained clean dry and intact. Pt had appropriate back soreness. No complaints of leg pain or new N/T/W. The patient remained afebrile with stable vital signs, and tolerated a regular diet. The patient continued to increase activities, and pain was well controlled with oral pain medications.   Consults: None  Significant Diagnostic Studies:  Results for orders placed or performed during the hospital encounter of 01/23/21  ABO/Rh  Result Value Ref Range   ABO/RH(D)      O POS Performed at [facility], [place] [address], [place], [place] [address]     Chest 2 View  Result Date: 01/20/2021 CLINICAL DATA:  Preop for lumbar surgery. EXAM: CHEST - 2 VIEW COMPARISON:  None. FINDINGS: The heart size and mediastinal contours are within normal limits. Both lungs are clear. The visualized skeletal structures are unremarkable. IMPRESSION: No active cardiopulmonary disease. Electronically Signed   By: Marijo Conception M.D.   On: 01/20/2021 10:48   DG Lumbar Spine 2-3 Views  Result Date: 01/23/2021 CLINICAL DATA:  PLIF L3-4 and L4-5. EXAM: LUMBAR SPINE - 2-3 VIEW; DG C-ARM 1-60 MIN COMPARISON:  10/31/2020 lumbar spine MRI FLUOROSCOPY TIME:  Fluoroscopy Time:  1 minutes 27 seconds Number of Acquired Spot  Images: 3 FINDINGS: Bilateral posterior approach pedicle screws at the L3, L4 and L5 levels with bone cages in the L3-4 and L4-5 discs. IMPRESSION: Intraoperative fluoroscopic guidance for PLIF L3-5. Electronically Signed   By: Ilona Sorrel M.D.   On: 01/23/2021 11:44   DG C-Arm 1-60 Min  Result Date: 01/23/2021 CLINICAL DATA:  PLIF L3-4 and L4-5. EXAM: LUMBAR SPINE - 2-3 VIEW; DG C-ARM 1-60 MIN COMPARISON:  10/31/2020 lumbar spine MRI FLUOROSCOPY TIME:  Fluoroscopy Time:  1 minutes 27 seconds Number of Acquired Spot Images: 3 FINDINGS: Bilateral posterior approach pedicle screws at the L3, L4 and L5 levels with bone cages in the L3-4 and L4-5 discs. IMPRESSION: Intraoperative fluoroscopic guidance for PLIF L3-5. Electronically Signed   By: Ilona Sorrel M.D.   On: 01/23/2021 11:44    Antibiotics:  Anti-infectives (From admission, onward)   Start     Dose/Rate Route Frequency Ordered Stop   01/23/21 1600  ceFAZolin (ANCEF) IVPB 2g/100 mL premix        2 g 200 mL/hr over 30 Minutes Intravenous Every 8 hours 01/23/21 1305 01/23/21 2359   01/23/21 0700  ceFAZolin (ANCEF) IVPB 2g/100 mL premix  Status:  Discontinued        2 g 200 mL/hr over 30 Minutes Intravenous On call to O.R. 01/23/21 6629 01/23/21 1303   01/23/21 0651  ceFAZolin (ANCEF) 2-4 GM/100ML-% IVPB       Note to Pharmacy: Maryjean Ka   : cabinet override      01/23/21 0651 01/23/21 1859      Discharge Exam: Blood pressure (!) 122/52, pulse  62, temperature 98 F (36.7 C), temperature source Oral, resp. rate 18, height 5\' 3"  (1.6 m), weight 69.9 kg, SpO2 100 %. Neurologic: Grossly normal Ambulating and voiding well, incision cdi   Discharge Medications:   Allergies as of 01/24/2021      Reactions   Demerol [meperidine] Nausea Only   Nickel Rash   Tape Rash      Medication List    TAKE these medications   acetaminophen 500 MG tablet Commonly known as: TYLENOL Take 1,000 mg by mouth every 6 (six) hours as needed for  moderate pain.   amLODipine 10 MG tablet Commonly known as: NORVASC Take 10 mg by mouth daily.   aspirin EC 81 MG tablet Take 81 mg by mouth daily. Swallow whole.   CANNABIDIOL PO Take 2 capsules by mouth at bedtime.   CITRACAL PO Take 1 tablet by mouth daily.   Coral Calcium 1000 (390 Ca) MG Tabs Take by mouth.   fexofenadine 180 MG tablet Commonly known as: ALLEGRA Take 180 mg by mouth daily as needed for allergies.   Fish Oil 1200 MG Caps Take 1,200 mg by mouth 2 (two) times daily.   folic acid 1 MG tablet Commonly known as: FOLVITE Take 2 mg by mouth daily.   hydrochlorothiazide 25 MG tablet Commonly known as: HYDRODIURIL Take 25 mg by mouth daily.   losartan 100 MG tablet Commonly known as: COZAAR Take 100 mg by mouth daily.   MELATONIN PO Take 1.5 mg by mouth at bedtime as needed (sleep).   meloxicam 15 MG tablet Commonly known as: MOBIC Take 15 mg by mouth daily.   methocarbamol 500 MG tablet Commonly known as: Robaxin Take 1 tablet (500 mg total) by mouth 4 (four) times daily.   multivitamin tablet Take 1 tablet by mouth daily.   NON FORMULARY Place under the tongue at bedtime. CBD Oil- nightly   omeprazole 40 MG capsule Commonly known as: PRILOSEC Take 1 capsule (40 mg total) by mouth daily before breakfast. Take one pill 20-30 minutes before breakfast meal.   oxyCODONE-acetaminophen 5-325 MG tablet Commonly known as: Percocet Take 1 tablet by mouth every 4 (four) hours as needed for severe pain.   polycarbophil 625 MG tablet Commonly known as: FIBERCON Take 625 mg by mouth daily.   polyethylene glycol 17 g packet Commonly known as: MIRALAX / GLYCOLAX Take 17 g by mouth every Monday, Wednesday, and Friday.   PSYLLIUM PO Take 1 packet by mouth daily. Fiber   Renflexis 100 MG Solr Generic drug: inFLIXimab-abda Inject 1 each into the vein every 8 (eight) weeks.   valACYclovir 500 MG tablet Commonly known as: VALTREX Take 500 mg by  mouth daily as needed.   vitamin C 1000 MG tablet Take 1,000 mg by mouth daily.   VITAMIN C PO Take 600 mg by mouth daily.   Vitamin D-3 125 MCG (5000 UT) Tabs Take 1 tablet by mouth daily.   Vitamin D 50 MCG (2000 UT) Caps Take 2,000 Units by mouth daily.            Durable Medical Equipment  (From admission, onward)         Start     Ordered   01/23/21 1306  DME Walker rolling  Once       Question:  Patient needs a walker to treat with the following condition  Answer:  S/P lumbar fusion   01/23/21 1305   01/23/21 1306  DME 3 n 1  Once  01/23/21 1305          Disposition: home   Final Dx: PLIF L3-4, L4-5  Discharge Instructions     Remove dressing in 72 hours   Complete by: As directed    Call MD for:  difficulty breathing, headache or visual disturbances   Complete by: As directed    Call MD for:  hives   Complete by: As directed    Call MD for:  persistant nausea and vomiting   Complete by: As directed    Call MD for:  redness, tenderness, or signs of infection (pain, swelling, redness, odor or green/yellow discharge around incision site)   Complete by: As directed    Call MD for:  severe uncontrolled pain   Complete by: As directed    Call MD for:  temperature >100.4   Complete by: As directed    Diet - low sodium heart healthy   Complete by: As directed    Driving Restrictions   Complete by: As directed    No driving for 2 weeks, no riding in the car for 1 week   Increase activity slowly   Complete by: As directed    Lifting restrictions   Complete by: As directed    No lifting more than 8 lbs         Signed: Ocie Cornfield Meyran 01/24/2021, 7:50 AM

## 2021-01-24 NOTE — Plan of Care (Signed)
Patient alert and oriented, voiding adequately, MAE well with no difficulty. Incision area cdi with no s/s of infection. Patient discharged home per order. Patient and husband stated understanding of discharge instructions given. Patient has an appointment with Dr. Pool  

## 2021-01-26 ENCOUNTER — Encounter (HOSPITAL_COMMUNITY): Payer: Self-pay | Admitting: Neurological Surgery

## 2021-02-22 DIAGNOSIS — M0579 Rheumatoid arthritis with rheumatoid factor of multiple sites without organ or systems involvement: Secondary | ICD-10-CM | POA: Diagnosis not present

## 2021-03-09 DIAGNOSIS — M48062 Spinal stenosis, lumbar region with neurogenic claudication: Secondary | ICD-10-CM | POA: Diagnosis not present

## 2021-03-27 DIAGNOSIS — H35033 Hypertensive retinopathy, bilateral: Secondary | ICD-10-CM | POA: Diagnosis not present

## 2021-03-27 DIAGNOSIS — Z961 Presence of intraocular lens: Secondary | ICD-10-CM | POA: Diagnosis not present

## 2021-03-27 DIAGNOSIS — H04123 Dry eye syndrome of bilateral lacrimal glands: Secondary | ICD-10-CM | POA: Diagnosis not present

## 2021-03-27 DIAGNOSIS — H26491 Other secondary cataract, right eye: Secondary | ICD-10-CM | POA: Diagnosis not present

## 2021-03-27 DIAGNOSIS — H524 Presbyopia: Secondary | ICD-10-CM | POA: Diagnosis not present

## 2021-04-09 IMAGING — CR DG CHEST 2V
2 series · 2 of 2 positions shown · non-contrast
Comparison: None.

CLINICAL DATA: Preop for lumbar surgery.

EXAM:
CHEST - 2 VIEW

[w chest pa]
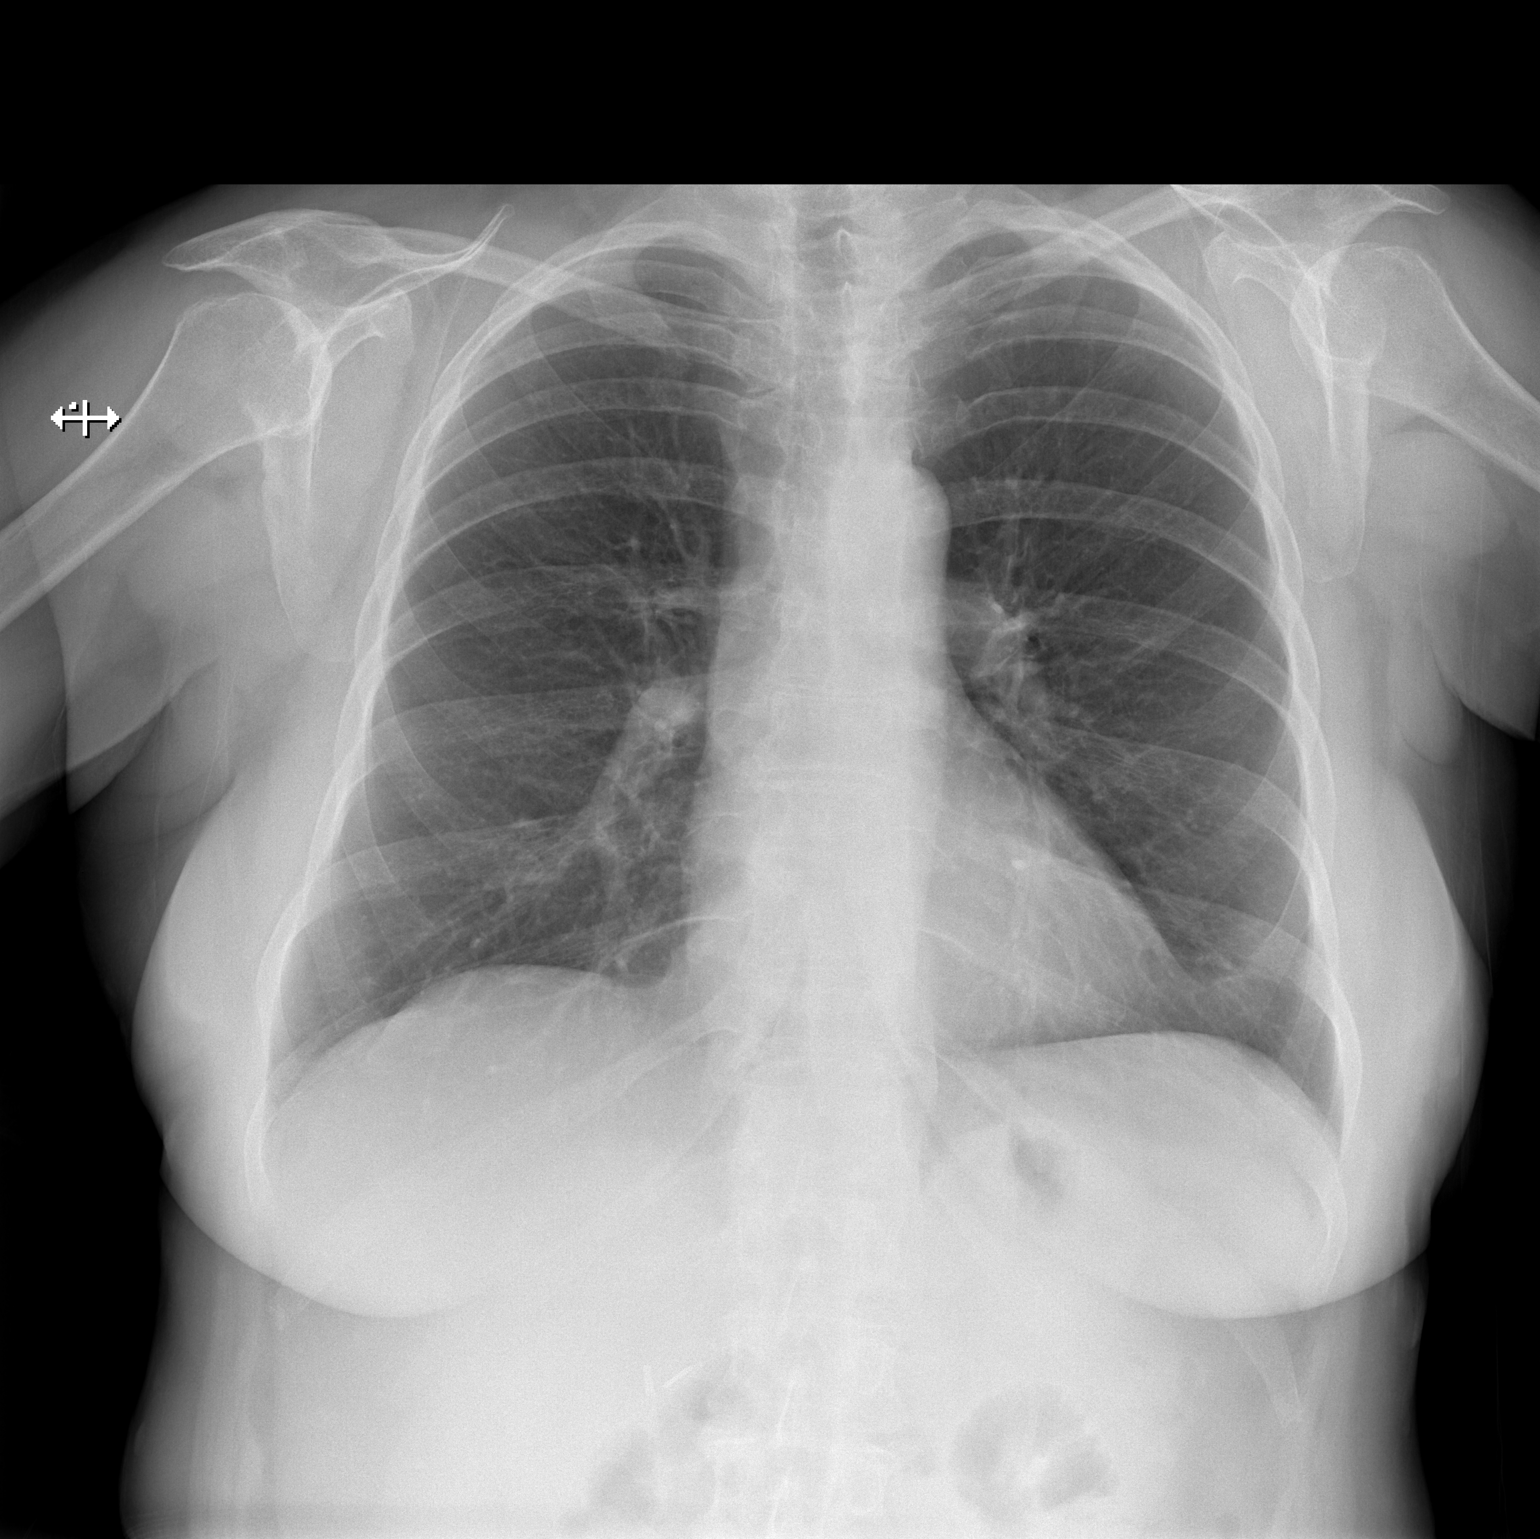

[w chest lat]
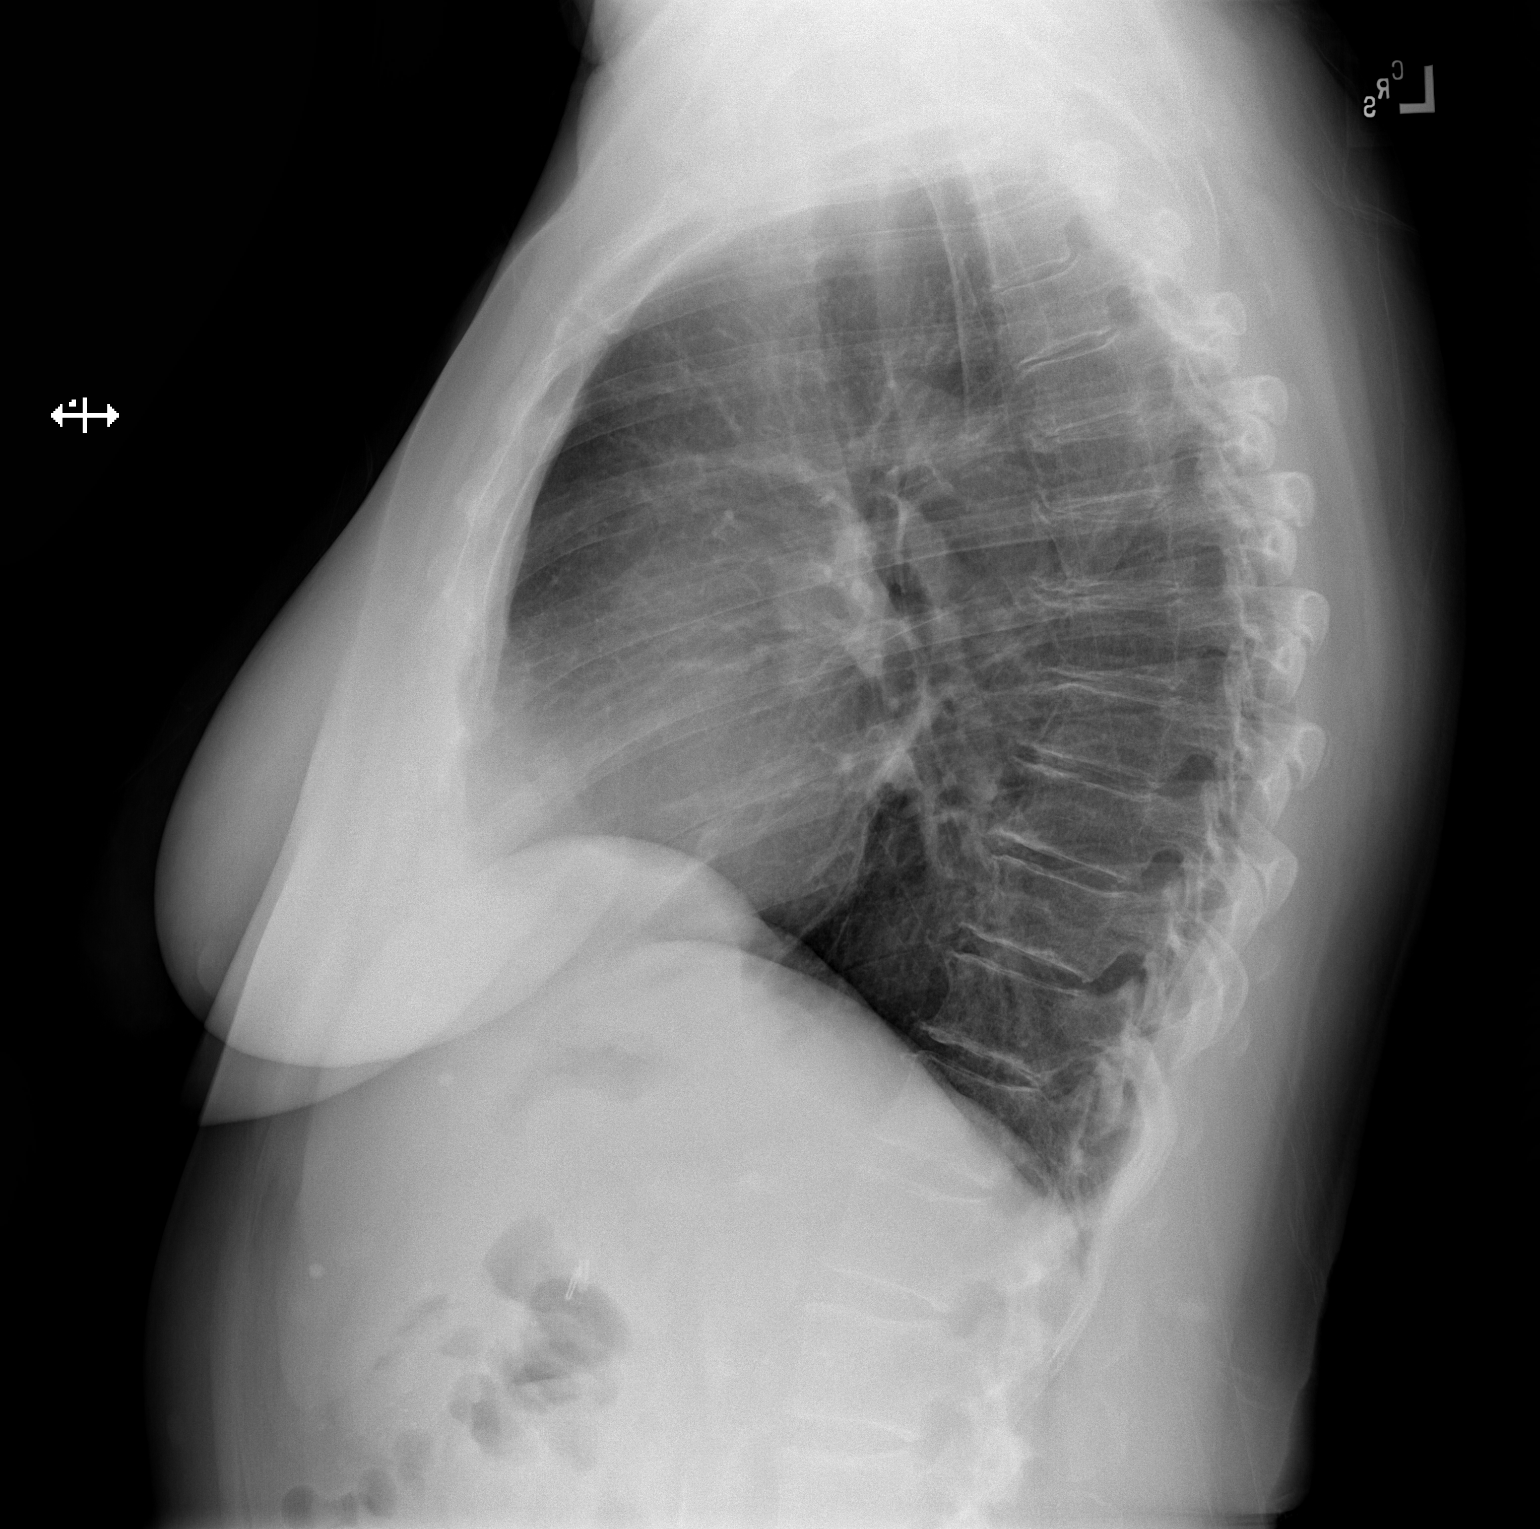

[2 of 2 positions shown; findings below may reference images not displayed]

FINDINGS: The heart size and mediastinal contours are within normal limits.
Both lungs are clear. The visualized skeletal structures are
unremarkable.
IMPRESSION: No active cardiopulmonary disease.

## 2021-04-10 DIAGNOSIS — M2550 Pain in unspecified joint: Secondary | ICD-10-CM | POA: Diagnosis not present

## 2021-04-20 DIAGNOSIS — M0579 Rheumatoid arthritis with rheumatoid factor of multiple sites without organ or systems involvement: Secondary | ICD-10-CM | POA: Diagnosis not present

## 2021-04-26 DIAGNOSIS — R300 Dysuria: Secondary | ICD-10-CM | POA: Diagnosis not present

## 2021-04-26 DIAGNOSIS — R350 Frequency of micturition: Secondary | ICD-10-CM | POA: Diagnosis not present

## 2021-05-09 DIAGNOSIS — E559 Vitamin D deficiency, unspecified: Secondary | ICD-10-CM | POA: Diagnosis not present

## 2021-05-09 DIAGNOSIS — R739 Hyperglycemia, unspecified: Secondary | ICD-10-CM | POA: Diagnosis not present

## 2021-05-09 DIAGNOSIS — Z0000 Encounter for general adult medical examination without abnormal findings: Secondary | ICD-10-CM | POA: Diagnosis not present

## 2021-05-09 DIAGNOSIS — E785 Hyperlipidemia, unspecified: Secondary | ICD-10-CM | POA: Diagnosis not present

## 2021-05-10 DIAGNOSIS — R252 Cramp and spasm: Secondary | ICD-10-CM | POA: Diagnosis not present

## 2021-05-10 DIAGNOSIS — Z6828 Body mass index (BMI) 28.0-28.9, adult: Secondary | ICD-10-CM | POA: Diagnosis not present

## 2021-05-10 DIAGNOSIS — E663 Overweight: Secondary | ICD-10-CM | POA: Diagnosis not present

## 2021-05-10 DIAGNOSIS — M150 Primary generalized (osteo)arthritis: Secondary | ICD-10-CM | POA: Diagnosis not present

## 2021-05-10 DIAGNOSIS — Z79899 Other long term (current) drug therapy: Secondary | ICD-10-CM | POA: Diagnosis not present

## 2021-05-10 DIAGNOSIS — M0579 Rheumatoid arthritis with rheumatoid factor of multiple sites without organ or systems involvement: Secondary | ICD-10-CM | POA: Diagnosis not present

## 2021-05-10 DIAGNOSIS — M2550 Pain in unspecified joint: Secondary | ICD-10-CM | POA: Diagnosis not present

## 2021-05-16 DIAGNOSIS — J45909 Unspecified asthma, uncomplicated: Secondary | ICD-10-CM | POA: Diagnosis not present

## 2021-05-16 DIAGNOSIS — F330 Major depressive disorder, recurrent, mild: Secondary | ICD-10-CM | POA: Diagnosis not present

## 2021-05-16 DIAGNOSIS — M48061 Spinal stenosis, lumbar region without neurogenic claudication: Secondary | ICD-10-CM | POA: Diagnosis not present

## 2021-05-16 DIAGNOSIS — Z1331 Encounter for screening for depression: Secondary | ICD-10-CM | POA: Diagnosis not present

## 2021-05-16 DIAGNOSIS — I10 Essential (primary) hypertension: Secondary | ICD-10-CM | POA: Diagnosis not present

## 2021-05-16 DIAGNOSIS — N3281 Overactive bladder: Secondary | ICD-10-CM | POA: Diagnosis not present

## 2021-05-16 DIAGNOSIS — M8580 Other specified disorders of bone density and structure, unspecified site: Secondary | ICD-10-CM | POA: Diagnosis not present

## 2021-05-16 DIAGNOSIS — M069 Rheumatoid arthritis, unspecified: Secondary | ICD-10-CM | POA: Diagnosis not present

## 2021-05-16 DIAGNOSIS — E785 Hyperlipidemia, unspecified: Secondary | ICD-10-CM | POA: Diagnosis not present

## 2021-05-16 DIAGNOSIS — G729 Myopathy, unspecified: Secondary | ICD-10-CM | POA: Diagnosis not present

## 2021-05-16 DIAGNOSIS — Z0000 Encounter for general adult medical examination without abnormal findings: Secondary | ICD-10-CM | POA: Diagnosis not present

## 2021-05-16 DIAGNOSIS — Z1339 Encounter for screening examination for other mental health and behavioral disorders: Secondary | ICD-10-CM | POA: Diagnosis not present

## 2021-05-16 DIAGNOSIS — M1990 Unspecified osteoarthritis, unspecified site: Secondary | ICD-10-CM | POA: Diagnosis not present

## 2021-05-16 DIAGNOSIS — Z1212 Encounter for screening for malignant neoplasm of rectum: Secondary | ICD-10-CM | POA: Diagnosis not present

## 2021-05-16 DIAGNOSIS — F419 Anxiety disorder, unspecified: Secondary | ICD-10-CM | POA: Diagnosis not present

## 2021-06-01 DIAGNOSIS — Z111 Encounter for screening for respiratory tuberculosis: Secondary | ICD-10-CM | POA: Diagnosis not present

## 2021-06-01 DIAGNOSIS — Z79899 Other long term (current) drug therapy: Secondary | ICD-10-CM | POA: Diagnosis not present

## 2021-06-01 DIAGNOSIS — M0579 Rheumatoid arthritis with rheumatoid factor of multiple sites without organ or systems involvement: Secondary | ICD-10-CM | POA: Diagnosis not present

## 2021-06-01 DIAGNOSIS — R5383 Other fatigue: Secondary | ICD-10-CM | POA: Diagnosis not present

## 2021-06-05 DIAGNOSIS — Z6827 Body mass index (BMI) 27.0-27.9, adult: Secondary | ICD-10-CM | POA: Diagnosis not present

## 2021-06-05 DIAGNOSIS — K5792 Diverticulitis of intestine, part unspecified, without perforation or abscess without bleeding: Secondary | ICD-10-CM | POA: Insufficient documentation

## 2021-06-05 DIAGNOSIS — Z01419 Encounter for gynecological examination (general) (routine) without abnormal findings: Secondary | ICD-10-CM | POA: Diagnosis not present

## 2021-06-05 DIAGNOSIS — N8110 Cystocele, unspecified: Secondary | ICD-10-CM | POA: Diagnosis not present

## 2021-06-05 DIAGNOSIS — N958 Other specified menopausal and perimenopausal disorders: Secondary | ICD-10-CM | POA: Diagnosis not present

## 2021-06-05 DIAGNOSIS — M8588 Other specified disorders of bone density and structure, other site: Secondary | ICD-10-CM | POA: Diagnosis not present

## 2021-06-05 DIAGNOSIS — M8580 Other specified disorders of bone density and structure, unspecified site: Secondary | ICD-10-CM | POA: Insufficient documentation

## 2021-06-05 DIAGNOSIS — Z1231 Encounter for screening mammogram for malignant neoplasm of breast: Secondary | ICD-10-CM | POA: Diagnosis not present

## 2021-06-05 DIAGNOSIS — N3281 Overactive bladder: Secondary | ICD-10-CM | POA: Diagnosis not present

## 2021-07-06 DIAGNOSIS — N8110 Cystocele, unspecified: Secondary | ICD-10-CM | POA: Diagnosis not present

## 2021-07-13 DIAGNOSIS — M0579 Rheumatoid arthritis with rheumatoid factor of multiple sites without organ or systems involvement: Secondary | ICD-10-CM | POA: Diagnosis not present

## 2021-07-25 ENCOUNTER — Other Ambulatory Visit: Payer: Self-pay

## 2021-07-25 ENCOUNTER — Encounter: Payer: Self-pay | Admitting: Emergency Medicine

## 2021-07-25 ENCOUNTER — Ambulatory Visit
Admission: EM | Admit: 2021-07-25 | Discharge: 2021-07-25 | Disposition: A | Discharge status: Home / Self Care | Payer: PPO | Attending: Family Medicine | Admitting: Family Medicine

## 2021-07-25 DIAGNOSIS — L255 Unspecified contact dermatitis due to plants, except food: Secondary | ICD-10-CM | POA: Diagnosis not present

## 2021-07-25 MED ORDER — FLUOCINONIDE 0.05 % EX OINT: 1.0000 "application " | TOPICAL_OINTMENT | Freq: Two times a day (BID) | CUTANEOUS | 0 refills | Status: DC

## 2021-07-25 MED ORDER — PREDNISONE 20 MG PO TABS: 40.0000 mg | ORAL_TABLET | Freq: Every day | ORAL | 0 refills | Status: DC

## 2021-07-25 NOTE — ED Triage Notes (Signed)
Patient has a rash on the bottom of her right leg x 9-10 days.  Patient has been using Benadryl cream, Hydrocortisone Cream and oral Benadryl at night.

## 2021-07-25 NOTE — ED Provider Notes (Signed)
Courtney Crane   [id number] 07/25/21 Arrival Time: [id number]  ASSESSMENT & PLAN:  1. Rhus dermatitis    No sign of bacterial skin infection.  Begin: Meds ordered this encounter  Medications   predniSONE (DELTASONE) 20 MG tablet    Sig: Take 2 tablets (40 mg total) by mouth daily.    Dispense:  10 tablet    Refill:  0   fluocinonide ointment (LIDEX) 0.05 %    Sig: Apply 1 application topically 2 (two) times daily.    Dispense:  30 g    Refill:  0    Will follow up with PCP or here if worsening or failing to improve as anticipated. Reviewed expectations re: course of current medical issues. Questions answered. Outlined signs and symptoms indicating need for more acute intervention. Patient verbalized understanding. After Visit Summary given.   SUBJECTIVE:  Courtney Crane is a 70 y.o. female who presents with a skin complaint. Anterior RLE. Itchy rash; past week. OTC Benadryl cream without much help. Afebrile.     OBJECTIVE: Vitals:   07/25/21 1557 07/25/21 1559  BP: (!) 145/75   Pulse: 87   Temp: 98.2 F (36.8 C)   TempSrc: Oral   SpO2: 97%   Weight:  72.6 kg  Height:  '5\' 2"'$  (1.575 m)    General appearance: alert; no distress HEENT: [place]; AT Neck: supple with FROM Extremities: no edema; moves all extremities normally Skin: warm and dry; signs of infection: no; patch of very fine papules with surrounding erythema over anterior RLE Psychological: alert and cooperative; normal mood and affect  Allergies  Allergen Reactions   Demerol [Meperidine] Nausea Only   Nickel Rash   Tape Rash    Past Medical History:  Diagnosis Date   Anemia    Anxiety    Arthritis    Asthma    Cataract    Colon polyps    Complication of anesthesia    "sometimes I dont like to wake up after " ie difficult to awaken    Diverticulitis    Diverticulosis    Fatty liver    GERD (gastroesophageal reflux disease)    HLD (hyperlipidemia)    HTN (hypertension)    IBS  (irritable bowel syndrome)    Insomnia    MRSA infection    Osteoarthritis    Osteopenia    PONV (postoperative nausea and vomiting)    RA (rheumatoid arthritis) (HCC)    Rheumatoid arthritis (HCC)    Seasonal allergies    Shingles    Status post dilation of esophageal narrowing    Vitamin D deficiency    Social History   Socioeconomic History   Marital status: Married    Spouse name: Not on file   Number of children: 2   Years of education: Not on file   Highest education level: Not on file  Occupational History   Occupation: retired  Tobacco Use   Smoking status: Never   Smokeless tobacco: Never  Vaping Use   Vaping Use: Never used  Substance and Sexual Activity   Alcohol use: Yes    Alcohol/week: 7.0 standard drinks    Types: 7 Standard drinks or equivalent per week    Comment: wine daily   Drug use: No   Sexual activity: Not on file  Other Topics Concern   Not on file  Social History Narrative   Retired from [employer]   Lives at home with husband ; one level home   Right handed  Highest level of education:  Some college   Social Determinants of Health   Financial Resource Strain: Not on file  Food Insecurity: Not on file  Transportation Needs: Not on file  Physical Activity: Not on file  Stress: Not on file  Social Connections: Not on file  Intimate Partner Violence: Not on file   Family History  Problem Relation Age of Onset   Colon polyps Mother    Diabetes Mother    Heart disease Mother    Kidney cancer Mother    Arthritis Mother    Hypertension Mother    Hyperlipidemia Mother    Colon polyps Son    CAD Brother    Esophageal cancer Brother    Dementia Father    Colon cancer Neg Hx    Stomach cancer Neg Hx    Rectal cancer Neg Hx    Prostate cancer Neg Hx    Past Surgical History:  Procedure Laterality Date   APPENDECTOMY  1975   bladder tack  1990   CATARACT EXTRACTION, BILATERAL Bilateral    CHOLECYSTECTOMY N/A 01/31/2017   Procedure:  LAPAROSCOPIC CHOLECYSTECTOMY WITH INTRAOPERATIVE CHOLANGIOGRAM;  Surgeon: Armandina Gemma, MD;  Location: [facility];  Service: General;  Laterality: N/A;   INCISION AND DRAINAGE PERIRECTAL ABSCESS N/A 12/02/2017   Procedure: IRRIGATION AND DEBRIDEMENT PERINEAL ABSCESS;  Surgeon: Greer Pickerel, MD;  Location: [facility];  Service: General;  Laterality: N/A;   MENISCUS REPAIR Right 2013   ROTATOR CUFF REPAIR Right 2005   TONSILLECTOMY     as a child   TOTAL ABDOMINAL HYSTERECTOMY  1988   TOTAL KNEE ARTHROPLASTY Left    WRIST RECONSTRUCTION Right 2008      Vanessa Kick, MD 07/25/21 1818

## 2021-08-10 DIAGNOSIS — E785 Hyperlipidemia, unspecified: Secondary | ICD-10-CM | POA: Diagnosis not present

## 2021-08-24 DIAGNOSIS — M150 Primary generalized (osteo)arthritis: Secondary | ICD-10-CM | POA: Diagnosis not present

## 2021-08-24 DIAGNOSIS — M2550 Pain in unspecified joint: Secondary | ICD-10-CM | POA: Diagnosis not present

## 2021-08-24 DIAGNOSIS — M0579 Rheumatoid arthritis with rheumatoid factor of multiple sites without organ or systems involvement: Secondary | ICD-10-CM | POA: Diagnosis not present

## 2021-08-24 DIAGNOSIS — R600 Localized edema: Secondary | ICD-10-CM | POA: Diagnosis not present

## 2021-08-24 DIAGNOSIS — Z6829 Body mass index (BMI) 29.0-29.9, adult: Secondary | ICD-10-CM | POA: Diagnosis not present

## 2021-08-24 DIAGNOSIS — E663 Overweight: Secondary | ICD-10-CM | POA: Diagnosis not present

## 2021-08-24 DIAGNOSIS — R5383 Other fatigue: Secondary | ICD-10-CM | POA: Diagnosis not present

## 2021-08-24 DIAGNOSIS — G5793 Unspecified mononeuropathy of bilateral lower limbs: Secondary | ICD-10-CM | POA: Diagnosis not present

## 2021-08-24 DIAGNOSIS — Z79899 Other long term (current) drug therapy: Secondary | ICD-10-CM | POA: Diagnosis not present

## 2021-08-30 DIAGNOSIS — M0579 Rheumatoid arthritis with rheumatoid factor of multiple sites without organ or systems involvement: Secondary | ICD-10-CM | POA: Diagnosis not present

## 2021-08-31 ENCOUNTER — Encounter (HOSPITAL_COMMUNITY): Payer: Self-pay | Admitting: Pharmacy Technician

## 2021-08-31 ENCOUNTER — Emergency Department (HOSPITAL_COMMUNITY): Payer: PPO

## 2021-08-31 ENCOUNTER — Emergency Department (HOSPITAL_COMMUNITY)
Admission: EM | Admit: 2021-08-31 | Discharge: 2021-08-31 | Disposition: A | Discharge status: Home / Self Care | Payer: PPO | Attending: Emergency Medicine | Admitting: Emergency Medicine

## 2021-08-31 ENCOUNTER — Other Ambulatory Visit: Payer: Self-pay

## 2021-08-31 DIAGNOSIS — Z7982 Long term (current) use of aspirin: Secondary | ICD-10-CM | POA: Diagnosis not present

## 2021-08-31 DIAGNOSIS — R072 Precordial pain: Secondary | ICD-10-CM | POA: Diagnosis not present

## 2021-08-31 DIAGNOSIS — R0602 Shortness of breath: Secondary | ICD-10-CM

## 2021-08-31 DIAGNOSIS — K219 Gastro-esophageal reflux disease without esophagitis: Secondary | ICD-10-CM | POA: Insufficient documentation

## 2021-08-31 DIAGNOSIS — I10 Essential (primary) hypertension: Secondary | ICD-10-CM | POA: Insufficient documentation

## 2021-08-31 DIAGNOSIS — R7989 Other specified abnormal findings of blood chemistry: Secondary | ICD-10-CM | POA: Diagnosis not present

## 2021-08-31 DIAGNOSIS — J45909 Unspecified asthma, uncomplicated: Secondary | ICD-10-CM | POA: Insufficient documentation

## 2021-08-31 DIAGNOSIS — Z79899 Other long term (current) drug therapy: Secondary | ICD-10-CM | POA: Insufficient documentation

## 2021-08-31 DIAGNOSIS — M47814 Spondylosis without myelopathy or radiculopathy, thoracic region: Secondary | ICD-10-CM | POA: Diagnosis not present

## 2021-08-31 DIAGNOSIS — N8110 Cystocele, unspecified: Secondary | ICD-10-CM | POA: Diagnosis not present

## 2021-08-31 DIAGNOSIS — E785 Hyperlipidemia, unspecified: Secondary | ICD-10-CM | POA: Diagnosis not present

## 2021-08-31 DIAGNOSIS — K753 Granulomatous hepatitis, not elsewhere classified: Secondary | ICD-10-CM | POA: Diagnosis not present

## 2021-08-31 DIAGNOSIS — I2699 Other pulmonary embolism without acute cor pulmonale: Secondary | ICD-10-CM | POA: Diagnosis not present

## 2021-08-31 DIAGNOSIS — R079 Chest pain, unspecified: Secondary | ICD-10-CM | POA: Diagnosis not present

## 2021-08-31 DIAGNOSIS — Z96652 Presence of left artificial knee joint: Secondary | ICD-10-CM | POA: Insufficient documentation

## 2021-08-31 LAB — BASIC METABOLIC PANEL
Anion gap: 12 (ref 5–15)
BUN: 16 mg/dL (ref 8–23)
CO2: 23 mmol/L (ref 22–32)
Calcium: 9.5 mg/dL (ref 8.9–10.3)
Chloride: 101 mmol/L (ref 98–111)
Creatinine, Ser: 1.01 mg/dL — ABNORMAL HIGH (ref 0.44–1.00)
GFR, Estimated: 60 mL/min — ABNORMAL LOW (ref >60)
Glucose, Bld: 100 mg/dL — ABNORMAL HIGH (ref 70–99)
Potassium: 3.5 mmol/L (ref 3.5–5.1)
Sodium: 136 mmol/L (ref 135–145)

## 2021-08-31 LAB — CBC
HCT: 40.1 % (ref 36.0–46.0)
Hemoglobin: 13.1 g/dL (ref 12.0–15.0)
MCH: 31 pg (ref 26.0–34.0)
MCHC: 32.7 g/dL (ref 30.0–36.0)
MCV: 94.8 fL (ref 80.0–100.0)
Platelets: 194 10*3/uL (ref 150–400)
RBC: 4.23 MIL/uL (ref 3.87–5.11)
RDW: 13.4 % (ref 11.5–15.5)
WBC: 9.6 10*3/uL (ref 4.0–10.5)
nRBC: 0 % (ref 0.0–0.2)

## 2021-08-31 LAB — HEPATIC FUNCTION PANEL
ALT: 41 U/L (ref 0–44)
AST: 42 U/L — ABNORMAL HIGH (ref 15–41)
Albumin: 3.6 g/dL (ref 3.5–5.0)
Alkaline Phosphatase: 49 U/L (ref 38–126)
Bilirubin, Direct: 0.2 mg/dL (ref 0.0–0.2)
Indirect Bilirubin: 0.4 mg/dL (ref 0.3–0.9)
Total Bilirubin: 0.6 mg/dL (ref 0.3–1.2)
Total Protein: 7.2 g/dL (ref 6.5–8.1)

## 2021-08-31 LAB — TROPONIN I (HIGH SENSITIVITY)
Troponin I (High Sensitivity): 4 ng/L (ref <18)
Troponin I (High Sensitivity): 5 ng/L (ref <18)

## 2021-08-31 LAB — BRAIN NATRIURETIC PEPTIDE: B Natriuretic Peptide: 19.7 pg/mL (ref 0.0–100.0)

## 2021-08-31 LAB — D-DIMER, QUANTITATIVE: D-Dimer, Quant: 0.77 ug/mL-FEU — ABNORMAL HIGH (ref 0.00–0.50)

## 2021-08-31 MED ORDER — SODIUM CHLORIDE 0.9 % IV BOLUS
500.0000 mL | Freq: Once | INTRAVENOUS | Status: AC
  Administered 2021-08-31: 500 mL via INTRAVENOUS

## 2021-08-31 MED ORDER — ASPIRIN 325 MG PO TABS
325.0000 mg | ORAL_TABLET | Freq: Every day | ORAL | Status: DC
  Administered 2021-08-31: 325 mg via ORAL
  Filled 2021-08-31: qty 1

## 2021-08-31 MED ORDER — IOHEXOL 350 MG/ML SOLN
7565.0000 mL | Freq: Once | INTRAVENOUS | Status: AC | PRN
  Administered 2021-08-31: 65 mL via INTRAVENOUS

## 2021-08-31 NOTE — ED Notes (Signed)
NT ambulated pt in room. Initial SPO2 was 100% and remained at 100% the entire time. Pt steady on feet with no complaints.

## 2021-08-31 NOTE — ED Provider Notes (Addendum)
Emergency Medicine Provider Triage Evaluation Note  Courtney Crane , a 70 y.o. female  was evaluated in triage.  Pt complains of severe chest pressure which radiates into the left arm of the jaw into the back that began last night.  She has been short of breath for some time but it has been acutely worse the last couple days.  Shortness of breath is worse with minimal exertion.  She was seen and evaluated at her primary care doctor who had chest x-ray and was sent to the emergency department for evaluation.  She does have a history of hypertension, hyperlipidemia, RA, and IBS.  Also reports associated bilateral leg swelling.  Review of Systems  Positive:  Negative: See above  Physical Exam  BP 134/64 (BP Location: Left Arm)   Pulse 85   Temp 99.1 F (37.3 C)   Resp 17   SpO2 100%  Gen:   Awake, no distress   Resp:  Normal effort  MSK:   Moves extremities without difficulty  Other:  Regular rate rhythm.  Chest pain is not producible.  Lungs are clear to auscultation.  1+ pedal edema bilateral lower extremities.  Medical Decision Making  Medically screening exam initiated at 1:51 PM.  Appropriate orders placed.  Leveda Pillars was informed that the remainder of the evaluation will be completed by another provider, this initial triage assessment does not replace that evaluation, and the importance of remaining in the ED until their evaluation is complete.  Concerning story for ACS. Pressures equal in the upper extremities.    Hendricks Limes, PA-C 08/31/21 1357    Hendricks Limes, PA-C 08/31/21 1358    Tegeler, Gwenyth Allegra, MD 08/31/21 [phone]

## 2021-08-31 NOTE — Discharge Instructions (Addendum)
Please read and follow all provided instructions.  Your diagnoses today include:  1. Precordial pain   2. Exertional shortness of breath     Tests performed today include: Complete blood cell count: No problems Basic metabolic panel: No problems Liver function test: No problems Cardiac enzymes (blood test looking for stress on the heart): 2 tests were both normal  Screening test for heart failure: Normal Screening test for blood clot: Was slightly high CT angiography of the chest to look for blood clots: No signs of blood clot or other problems with the heart or lungs today EKG: No signs of abnormal heart rhythm Chest x-ray: Was normal  Medications prescribed:  None  Take any prescribed medications only as directed.  Follow-up instructions: Please call your doctor in a cardiology referral listed tomorrow to schedule appropriate follow-up.  Return instructions:  SEEK IMMEDIATE MEDICAL ATTENTION IF: You have severe chest pain, especially if the pain is crushing or pressure-like and spreads to the arms, back, neck, or jaw, or if you have sweating, nausea (feeling sick to your stomach), or shortness of breath. THIS IS AN EMERGENCY. Don't wait to see if the pain will go away. Get medical help at once. Call 911 or 0 (operator). DO NOT drive yourself to the hospital.  Your chest pain gets worse and does not go away with rest.  You have an attack of chest pain lasting longer than usual, despite rest and treatment with the medications your caregiver has prescribed.  You wake from sleep with chest pain or shortness of breath. You feel dizzy or faint. You have chest pain not typical of your usual pain for which you originally saw your caregiver.  You have any other emergent concerns regarding your health.  Additional Information: Chest pain comes from many different causes. Your caregiver has diagnosed you as having chest pain that is not specific for one problem, but does not require  admission.  You are at low risk for an acute heart condition or other serious illness.   Your vital signs today were: BP (!) 132/58   Pulse 86   Temp 99.1 F (37.3 C)   Resp 10   SpO2 97%  If your blood pressure (BP) was elevated above 135/85 this visit, please have this repeated by your doctor within one month. --------------

## 2021-08-31 NOTE — ED Provider Notes (Signed)
[facility] EMERGENCY DEPARTMENT Provider Note   CSN: [id number] Arrival date & time: 08/31/21  1301     History Chief Complaint  Patient presents with   Shortness of Breath   Chest Pain    Courtney Crane is a 70 y.o. female.  Patient with history of hypertension, high cholesterol, GERD, reassuring echo in 2020 --presents to the emergency department for chest pressure and shortness of breath.  Patient began having shortness of breath that is worse with exertion over the past 3 weeks approximately.  She states that she has been attending exercise classes since July and was making good progress, however noted worsening exercise tolerance over that time.  She would notice her heart racing and could not make it through a 45-minute exercise class.  Symptoms were gradually worsening until a few days ago when they progressed even more.  She went to see her primary care today [facility]).  She was referred to the emergency department for further evaluation.  At onset of symptoms, patient had a intermittent chest pressure that was worse with exertion and improved with rest.  Over the past several days though it has been more constant in nature.  She reports chronic swelling resolved with compression stockings in her legs which she relates to her hypertension meds, however this has been more constant recently and not improved.  While she was driving to her primary care today she states that her left arm "fell asleep" and she had a pain in her jaw as well. Patient denies risk factors for pulmonary embolism including: unilateral leg swelling, history of DVT/PE/other blood clots, use of exogenous hormones, recent immobilizations, recent surgery, recent travel (>4hr segment), malignancy, hemoptysis.  No previous history of cardiac disease, PE.  No associated fever, vomiting, abdominal pain.  She has had an occasional nonproductive cough.        Past Medical History:  Diagnosis  Date   Anemia    Anxiety    Arthritis    Asthma    Cataract    Colon polyps    Complication of anesthesia    "sometimes I dont like to wake up after " ie difficult to awaken    Diverticulitis    Diverticulosis    Fatty liver    GERD (gastroesophageal reflux disease)    HLD (hyperlipidemia)    HTN (hypertension)    IBS (irritable bowel syndrome)    Insomnia    MRSA infection    Osteoarthritis    Osteopenia    PONV (postoperative nausea and vomiting)    RA (rheumatoid arthritis) (HCC)    Rheumatoid arthritis (HCC)    Seasonal allergies    Shingles    Status post dilation of esophageal narrowing    Vitamin D deficiency     Patient Active Problem List   Diagnosis Date Noted   S/P lumbar fusion 01/23/2021   Dysphagia 01/19/2020   DOE (dyspnea on exertion) 04/09/2019   Exertional chest pain 04/09/2019   Claudication ([facility]) 04/09/2019   Leukocytosis 12/04/2017   AKI (acute kidney injury) ([facility]) 11/29/2017   Rheumatoid arthritis ([facility]) 11/29/2017   Cellulitis and abscess of buttock 11/28/2017   Cholelithiasis with chronic cholecystitis 01/30/2017   History of shingles 06/06/2016   Genital herpes simplex 06/06/2016   Seasonal allergies 06/06/2016   Seborrheic dermatitis    Prediabetes    Anemia    GERD (gastroesophageal reflux disease)    Vitamin D deficiency    Insomnia, unspecified    Essential hypertension  Past Surgical History:  Procedure Laterality Date   APPENDECTOMY  1975   bladder tack  1990   CATARACT EXTRACTION, BILATERAL Bilateral    CHOLECYSTECTOMY N/A 01/31/2017   Procedure: LAPAROSCOPIC CHOLECYSTECTOMY WITH INTRAOPERATIVE CHOLANGIOGRAM;  Surgeon: Armandina Gemma, MD;  Location: [facility];  Service: General;  Laterality: N/A;   INCISION AND DRAINAGE PERIRECTAL ABSCESS N/A 12/02/2017   Procedure: IRRIGATION AND DEBRIDEMENT PERINEAL ABSCESS;  Surgeon: Greer Pickerel, MD;  Location: [facility];  Service: General;  Laterality: N/A;   MENISCUS REPAIR Right 2013    ROTATOR CUFF REPAIR Right 2005   TONSILLECTOMY     as a child   TOTAL ABDOMINAL HYSTERECTOMY  1988   TOTAL KNEE ARTHROPLASTY Left    WRIST RECONSTRUCTION Right 2008     OB History   No obstetric history on file.     Family History  Problem Relation Age of Onset   Colon polyps Mother    Diabetes Mother    Heart disease Mother    Kidney cancer Mother    Arthritis Mother    Hypertension Mother    Hyperlipidemia Mother    Colon polyps Son    CAD Brother    Esophageal cancer Brother    Dementia Father    Colon cancer Neg Hx    Stomach cancer Neg Hx    Rectal cancer Neg Hx    Prostate cancer Neg Hx     Social History   Tobacco Use   Smoking status: Never   Smokeless tobacco: Never  Vaping Use   Vaping Use: Never used  Substance Use Topics   Alcohol use: Yes    Alcohol/week: 7.0 standard drinks    Types: 7 Standard drinks or equivalent per week    Comment: wine daily   Drug use: No    Home Medications Prior to Admission medications   Medication Sig Start Date End Date Taking? Authorizing Provider  acetaminophen (TYLENOL) 500 MG tablet Take 1,000 mg by mouth every 6 (six) hours as needed for moderate pain.   Yes [provider]  amLODipine (NORVASC) 10 MG tablet Take 10 mg by mouth daily.    Yes [provider]  aspirin EC 81 MG tablet Take 81 mg by mouth daily. Swallow whole.   Yes [provider]  CANNABIDIOL PO Take 2 capsules by mouth at bedtime.   Yes [provider]  Cholecalciferol (VITAMIN D-3) 125 MCG (5000 UT) TABS Take 1 tablet by mouth daily.   Yes [provider]  fexofenadine (ALLEGRA) 180 MG tablet Take 180 mg by mouth daily as needed for allergies.   Yes [provider]  folic acid (FOLVITE) 1 MG tablet Take 2 mg by mouth daily.   Yes [provider]  hydrochlorothiazide (HYDRODIURIL) 25 MG tablet Take 25 mg by mouth daily.   Yes [provider]  inFLIXimab-abda (RENFLEXIS) 100 MG  SOLR Inject 1 each into the vein every 8 (eight) weeks.   Yes [provider]  losartan (COZAAR) 100 MG tablet Take 100 mg by mouth daily.   Yes [provider]  MELATONIN PO Take 1.5 mg by mouth at bedtime as needed (sleep).   Yes [provider]  meloxicam (MOBIC) 15 MG tablet Take 15 mg by mouth daily.   Yes [provider]  Multiple Vitamin (MULTIVITAMIN) tablet Take 1 tablet by mouth daily.   Yes [provider]  Omega-3 Fatty Acids (FISH OIL) 1200 MG CAPS Take 1,200 mg by mouth 2 (two)  times daily.   Yes [provider]  omeprazole (PRILOSEC) 40 MG capsule Take 1 capsule (40 mg total) by mouth daily before breakfast. Take one pill 20-30 minutes before breakfast meal. 01/18/21  Yes Milus Banister, MD  polycarbophil (FIBERCON) 625 MG tablet Take 625 mg by mouth daily.   Yes [provider]  polyethylene glycol (MIRALAX / GLYCOLAX) 17 g packet Take 17 g by mouth every Monday, Wednesday, and Friday.   Yes [provider]  rosuvastatin (CRESTOR) 5 MG tablet Take 5 mg by mouth daily. 08/15/21  Yes [provider]  valACYclovir (VALTREX) 500 MG tablet Take 500 mg by mouth daily as needed.   Yes [provider]  fluocinonide ointment (LIDEX) 0.25 % Apply 1 application topically 2 (two) times daily. Patient not taking: No sig reported 07/25/21   Vanessa Kick, MD  methocarbamol (ROBAXIN) 500 MG tablet Take 1 tablet (500 mg total) by mouth 4 (four) times daily. Patient not taking: Reported on 08/31/2021 01/24/21   Meyran, Ocie Cornfield, NP  oxyCODONE-acetaminophen (PERCOCET) 5-325 MG tablet Take 1 tablet by mouth every 4 (four) hours as needed for severe pain. Patient not taking: Reported on 08/31/2021 01/24/21 01/24/22  Eleonore Chiquito, NP  predniSONE (DELTASONE) 20 MG tablet Take 2 tablets (40 mg total) by mouth daily. Patient not taking: No sig reported 07/25/21   Vanessa Kick, MD    Allergies    Demerol  [meperidine], Nickel, and Tape  Review of Systems   Review of Systems  Constitutional:  Negative for diaphoresis and fever.  Eyes:  Negative for redness.  Respiratory:  Positive for chest tightness and shortness of breath. Negative for cough.   Cardiovascular:  Positive for chest pain and leg swelling. Negative for palpitations.  Gastrointestinal:  Positive for nausea. Negative for abdominal pain and vomiting.  Genitourinary:  Negative for dysuria.  Musculoskeletal:  Negative for back pain and neck pain.  Skin:  Negative for rash.  Neurological:  Negative for syncope and light-headedness.  Psychiatric/Behavioral:  The patient is not nervous/anxious.    Physical Exam Updated Vital Signs BP (!) 143/55   Pulse 83   Temp 99.1 F (37.3 C)   Resp 12   SpO2 98%   Physical Exam Vitals and nursing note reviewed.  Constitutional:      Appearance: She is well-developed. She is not diaphoretic.  HENT:     Head: Normocephalic and atraumatic.     Mouth/Throat:     Mouth: Mucous membranes are not dry.  Eyes:     Conjunctiva/sclera: Conjunctivae normal.  Neck:     Vascular: Normal carotid pulses. No JVD.     Trachea: Trachea normal. No tracheal deviation.  Cardiovascular:     Rate and Rhythm: Normal rate and regular rhythm.     Pulses: No decreased pulses.          Radial pulses are 2+ on the right side and 2+ on the left side.     Heart sounds: Normal heart sounds, S1 normal and S2 normal. No murmur heard. Pulmonary:     Effort: Pulmonary effort is normal. No respiratory distress.     Breath sounds: No wheezing, rhonchi or rales.  Chest:     Chest wall: No tenderness.  Abdominal:     General: Bowel sounds are normal.     Palpations: Abdomen is soft.     Tenderness: There is no abdominal tenderness. There is no guarding or rebound.  Musculoskeletal:  General: Normal range of motion.     Cervical back: Normal range of motion and neck supple. No muscular tenderness.      Right lower leg: No tenderness. No edema.     Left lower leg: No tenderness. No edema.  Skin:    General: Skin is warm and dry.     Coloration: Skin is not pale.  Neurological:     Mental Status: She is alert.    ED Results / Procedures / Treatments   Labs (all labs ordered are listed, but only abnormal results are displayed) Labs Reviewed  BASIC METABOLIC PANEL - Abnormal; Notable for the following components:      Result Value   Glucose, Bld 100 (*)    Creatinine, Ser 1.01 (*)    GFR, Estimated 60 (*)    All other components within normal limits  D-DIMER, QUANTITATIVE - Abnormal; Notable for the following components:   D-Dimer, Quant 0.77 (*)    All other components within normal limits  HEPATIC FUNCTION PANEL - Abnormal; Notable for the following components:   AST 42 (*)    All other components within normal limits  CBC  BRAIN NATRIURETIC PEPTIDE  TROPONIN I (HIGH SENSITIVITY)  TROPONIN I (HIGH SENSITIVITY)    EKG EKG Interpretation  Date/Time:  Thursday August 31 2021 13:26:17 EDT Ventricular Rate:  90 PR Interval:  152 QRS Duration: 66 QT Interval:  380 QTC Calculation: 464 R Axis:   42 Text Interpretation: Normal sinus rhythm Low voltage QRS Cannot rule out Anterior infarct , age undetermined Confirmed by Lennice Sites [phone]) on 08/31/2021 2:57:17 PM  Radiology DG Chest 2 View  Result Date: 08/31/2021 CLINICAL DATA:  Chest pain EXAM: CHEST - 2 VIEW COMPARISON:  March 2022 delete FINDINGS: The heart size and mediastinal contours are within normal limits. Both lungs are clear. No pleural effusion or pneumothorax. Minimally imaged lumbar fusion. Cholecystectomy clips. IMPRESSION: No acute process in the chest. Electronically Signed   By: Macy Mis M.D.   On: 08/31/2021 16:57   CT Angio Chest PE W and/or Wo Contrast  Result Date: 08/31/2021 CLINICAL DATA:  Elevated D-dimer. PE suspected, low/intermediate prob EXAM: CT ANGIOGRAPHY CHEST WITH CONTRAST  TECHNIQUE: Multidetector CT imaging of the chest was performed using the standard protocol during bolus administration of intravenous contrast. Multiplanar CT image reconstructions and MIPs were obtained to evaluate the vascular anatomy. CONTRAST:  66mL OMNIPAQUE IOHEXOL 350 MG/ML SOLN COMPARISON:  Chest radiograph from earlier today. FINDINGS: Cardiovascular: The study is high quality for the evaluation of pulmonary embolism. There are no filling defects in the central, lobar, segmental or subsegmental pulmonary artery branches to suggest acute pulmonary embolism. Mildly atherosclerotic nonaneurysmal thoracic aorta. Normal caliber pulmonary arteries. Normal heart size. No significant pericardial fluid/thickening. Mediastinum/Nodes: No discrete thyroid nodules. Unremarkable esophagus. No pathologically enlarged axillary, mediastinal or hilar lymph nodes. Lungs/Pleura: No pneumothorax. No pleural effusion. No acute consolidative airspace disease, lung masses or significant pulmonary nodules. Upper abdomen: Granulomatous calcifications scattered in the visualized liver. Musculoskeletal: No aggressive appearing focal osseous lesions. Moderate thoracic spondylosis. Review of the MIP images confirms the above findings. IMPRESSION: No pulmonary embolism. No active pulmonary disease. Aortic Atherosclerosis (ICD10-I70.0). Electronically Signed   By: Ilona Sorrel M.D.   On: 08/31/2021 19:09    Procedures Procedures   Medications Ordered in ED Medications  aspirin tablet 325 mg (325 mg Oral Given 08/31/21 1409)    ED Course  I have reviewed the triage vital signs and the nursing notes.  Pertinent labs & imaging results that were available during my care of the patient were reviewed by me and considered in my medical decision making (see chart for details).  Patient seen and examined.  Reviewed EKG and chest x-ray.  So far work-up is reassuring.  Patient requesting follow-up with [facility].  Vital signs reviewed and are as follows: BP (!) 143/55   Pulse 83   Temp 99.1 F (37.3 C)   Resp 12   SpO2 98%   Patient discussed with Dr. Ronnald Nian who will see.  6:16 PM went to recheck and update patient.  She is aware of need for CT angio per Dr. Ronnald Nian.  She is currently in CT.  7:25 PM patient stable, comfortable, no current pain.  She has ambulated without any difficulties, maintaining pulse ox of 100%.  No complaints with ambulation.   We discussed testing tonight which is overall very reassuring.  We had shared decision-making discussion regarding admission versus close outpatient follow-up with cardiology and PCP.  Given reassuring work-up tonight, patient states that she is comfortable with discharge to home.  She is willing to return if something changes or gets worse.  Patient was counseled to return with severe chest pain, especially if the pain is crushing or pressure-like and spreads to the arms, back, neck, or jaw, or if they have sweating, nausea, or shortness of breath with the pain. They were encouraged to call 911 with these symptoms.   The patient verbalized understanding and agreed.     MDM Rules/Calculators/A&P                           Patient with exertional dyspnea with chest pain that was more pronounced yesterday and today.  Sent from PCP for work-up.  ACS work-up was negative.  EKG without ischemic changes.  Troponin negative x2.  Chest x-ray clear.  Patient does not appear to be fluid overloaded or have signs of congestive heart failure on exam.  BNP was negative.  D-dimer was mildly elevated and CT angio of the chest did not have any acute findings.  Patient symptoms are currently resolved.  Her vital signs are normal with the exception of mild hypertension.  Shared decision making discussion as above.  At this point I am comfortable with her going home if she is willing to return if symptoms worsen.  She is comfortable with going home given  reassuring work-up and is willing to return if something worsens and follow-up with cardiology as outpatient.  She is requesting to see [facility].  Referral given.   Final Clinical Impression(s) / ED Diagnoses Final diagnoses:  Precordial pain  Exertional shortness of breath    Rx / DC Orders ED Discharge Orders     None        Carlisle Cater, PA-C 08/31/21 [place], [place], DO 08/31/21 2252

## 2021-08-31 NOTE — ED Notes (Signed)
Pt transported to CT ?

## 2021-08-31 NOTE — ED Provider Notes (Signed)
I personally evaluated the patient during the encounter and completed a history, physical, procedures, medical decision making to contribute to the overall care of the patient and decision making for the patient briefly, the patient is a 70 y.o. female is here with chest pain  shortness of breath.  Overall unremarkable vitals.  History of hypertension, high cholesterol.  Been having some chest pain and shortness of breath for the last several days on and off.  He be having some pain in the left arm today.  Overall she appears well.  Does have some cardiac risk factors.  Did have a unremarkable stress test 2 years ago.  Does not have any PE or DVT risk factors.  Troponin negative x2.  EKG shows sinus rhythm.  Unchanged from prior EKGs.  D-dimer was elevated and PE scan was obtained that was overall unremarkable.  No major coronary calcifications.  No PE, no pneumonia.  Did had discussion with her about possible admission for further cardiac work-up but after talking with my PA she preferred outpatient follow-up.  I think that this is reasonable as overall atypical story but she understands return precautions.  We will refer her to cardiology and discharged in the ED in good condition.  This chart was dictated using voice recognition software.  Despite best efforts to proofread,  errors can occur which can change the documentation meaning.    EKG Interpretation  Date/Time:  Thursday August 31 2021 13:26:17 EDT Ventricular Rate:  90 PR Interval:  152 QRS Duration: 66 QT Interval:  380 QTC Calculation: 464 R Axis:   42 Text Interpretation: Normal sinus rhythm Low voltage QRS Cannot rule out Anterior infarct , age undetermined Confirmed by Lennice Sites [phone]) on 08/31/2021 2:57:17 PM            Lennice Sites, DO 08/31/21 1926

## 2021-08-31 NOTE — ED Triage Notes (Signed)
Pt here with reports of shob X2 weeks progressing into chest pressure yesterday. This morning pt states her L arm went numb approx 0930/1000.

## 2021-09-01 ENCOUNTER — Telehealth: Payer: Self-pay | Admitting: Cardiology

## 2021-09-01 NOTE — Telephone Encounter (Signed)
Correct. Seen by me in 2020. Needs evaluation, but not urgent. See if [facility] has any availability, I can see the patient with her.  Thanks MJP

## 2021-09-01 NOTE — Telephone Encounter (Signed)
Patient says she was recently discharged from hospital [facility]). Says she was referred to cardiology but would like to see you since she is already a patient here. She says she needs to be seen as soon as possible, but you are booking out into November. She wondered if there is any way she could do 10/17 or the week after that. Wondering if you can take a look at her ER visit notes & decide when she should be seen.

## 2021-09-04 ENCOUNTER — Encounter: Payer: Self-pay | Admitting: Student

## 2021-09-04 ENCOUNTER — Ambulatory Visit: Payer: PPO | Admitting: Student

## 2021-09-04 ENCOUNTER — Other Ambulatory Visit: Payer: Self-pay

## 2021-09-04 VITALS — BP 132/59 | HR 76 | Temp 97.7°F | Ht 62.0 in | Wt 159.0 lb

## 2021-09-04 DIAGNOSIS — I10 Essential (primary) hypertension: Secondary | ICD-10-CM | POA: Diagnosis not present

## 2021-09-04 DIAGNOSIS — R072 Precordial pain: Secondary | ICD-10-CM | POA: Diagnosis not present

## 2021-09-04 DIAGNOSIS — R6889 Other general symptoms and signs: Secondary | ICD-10-CM | POA: Diagnosis not present

## 2021-09-04 NOTE — Progress Notes (Signed)
Primary Physician/Referring:  Velna Hatchet, MD  Patient ID: Courtney Crane, female    DOB: [birth year], 70 y.o.   MRN: [id number]  Chief Complaint  Patient presents with   Chest Pain   Follow-up   Hospitalization Follow-up   HPI:    Courtney Crane  is a 70 y.o. Caucasian female with history of hypertension, hyperlipidemia, rheumatoid arthritis, IBS, family history of premature CAD.  She was last seen in our office in 2020 at which time work-up was unremarkable and she was recommended for as needed follow-up.   Patient was recently seen in the emergency department 08/31/2021 with chest pain.  Work-up at that time the ED was unremarkable she was recommended to follow-up with cardiology outpatient.  Patient now presents to reestablish care with our office.  Notably she did have spinal fusion in March 2022 which has limited her ability to exercise over the last several months, however she is recently started going to the gym again.  She states that over the last 6 weeks she has had worsening bilateral ankle swelling as well as decreased exercise tolerance and intermittent chest pain which she describes as "pressure".  Patient's chest discomfort is at rest and exertion.  She states the pain is worse when lying on her right side.  She walks to the time times per week for about 1 mile, but requires breaks to "catch her breath".  Past Medical History:  Diagnosis Date   Anemia    Anxiety    Arthritis    Asthma    Cataract    Colon polyps    Complication of anesthesia    "sometimes I dont like to wake up after " ie difficult to awaken    Diverticulitis    Diverticulosis    Fatty liver    GERD (gastroesophageal reflux disease)    HLD (hyperlipidemia)    HTN (hypertension)    IBS (irritable bowel syndrome)    Insomnia    MRSA infection    Osteoarthritis    Osteopenia    PONV (postoperative nausea and vomiting)    RA (rheumatoid arthritis) (HCC)    Rheumatoid arthritis (HCC)     Seasonal allergies    Shingles    Status post dilation of esophageal narrowing    Vitamin D deficiency    Past Surgical History:  Procedure Laterality Date   APPENDECTOMY  1975   bladder tack  1990   CATARACT EXTRACTION, BILATERAL Bilateral    CHOLECYSTECTOMY N/A 01/31/2017   Procedure: LAPAROSCOPIC CHOLECYSTECTOMY WITH INTRAOPERATIVE CHOLANGIOGRAM;  Surgeon: Armandina Gemma, MD;  Location: [facility];  Service: General;  Laterality: N/A;   INCISION AND DRAINAGE PERIRECTAL ABSCESS N/A 12/02/2017   Procedure: IRRIGATION AND DEBRIDEMENT PERINEAL ABSCESS;  Surgeon: Greer Pickerel, MD;  Location: [place];  Service: General;  Laterality: N/A;   MENISCUS REPAIR Right 2013   ROTATOR CUFF REPAIR Right 2005   TONSILLECTOMY     as a child   TOTAL ABDOMINAL HYSTERECTOMY  1988   TOTAL KNEE ARTHROPLASTY Left    WRIST RECONSTRUCTION Right 2008   Family History  Problem Relation Age of Onset   Colon polyps Mother    Diabetes Mother    Heart disease Mother    Kidney cancer Mother    Arthritis Mother    Hypertension Mother    Hyperlipidemia Mother    Colon polyps Son    CAD Brother    Esophageal cancer Brother    Dementia Father    Colon cancer Neg Hx  Stomach cancer Neg Hx    Rectal cancer Neg Hx    Prostate cancer Neg Hx     Social History   Tobacco Use   Smoking status: Never   Smokeless tobacco: Never  Substance Use Topics   Alcohol use: Yes    Alcohol/week: 7.0 standard drinks    Types: 7 Standard drinks or equivalent per week    Comment: wine daily   Marital Status: Married   ROS  Review of Systems  Constitutional: Positive for malaise/fatigue. Negative for weight gain.  Cardiovascular:  Positive for chest pain and dyspnea on exertion. Negative for claudication, leg swelling, near-syncope, orthopnea, palpitations, paroxysmal nocturnal dyspnea and syncope.  Neurological:  Negative for dizziness.   Objective  Blood pressure (!) 132/59, pulse 76, temperature 97.7 F (36.5 C),  height 5\' 2"  (1.575 m), weight 159 lb (72.1 kg), SpO2 99 %.  Vitals with BMI 09/04/2021 08/31/2021 08/31/2021  Height 5\' 2"  - -  Weight 159 lbs - -  BMI 30.09 - -  Systolic 233 007 622  Diastolic 59 56 58  Pulse 76 85 86      Physical Exam Vitals reviewed.  HENT:     Head: Normocephalic and atraumatic.  Cardiovascular:     Rate and Rhythm: Normal rate and regular rhythm.     Pulses: Intact distal pulses.     Heart sounds: S1 normal and S2 normal. No murmur heard.   No gallop.  Pulmonary:     Effort: Pulmonary effort is normal. No respiratory distress.     Breath sounds: No wheezing, rhonchi or rales.  Musculoskeletal:     Right lower leg: No edema.     Left lower leg: No edema.  Neurological:     Mental Status: She is alert.    Laboratory examination:   Recent Labs    01/19/21 1400 08/31/21 1352  NA 136 136  K 3.3* 3.5  CL 98 101  CO2 24 23  GLUCOSE 89 100*  BUN 20 16  CREATININE 0.85 1.01*  CALCIUM 9.6 9.5  GFRNONAA >60 60*   estimated creatinine clearance is 48.2 mL/min (A) (by C-G formula based on SCr of 1.01 mg/dL (H)).  CMP Latest Ref Rng & Units 08/31/2021 01/19/2021 12/05/2017  Glucose 70 - 99 mg/dL 100(H) 89 97  BUN 8 - 23 mg/dL 16 20 14   Creatinine 0.44 - 1.00 mg/dL 1.01(H) 0.85 0.82  Sodium 135 - 145 mmol/L 136 136 139  Potassium 3.5 - 5.1 mmol/L 3.5 3.3(L) 3.9  Chloride 98 - 111 mmol/L 101 98 105  CO2 22 - 32 mmol/L 23 24 24   Calcium 8.9 - 10.3 mg/dL 9.5 9.6 9.0  Total Protein 6.5 - 8.1 g/dL 7.2 8.5(H) 6.3(L)  Total Bilirubin 0.3 - 1.2 mg/dL 0.6 0.8 0.5  Alkaline Phos 38 - 126 U/L 49 48 112  AST 15 - 41 U/L 42(H) 32 162(H)  ALT 0 - 44 U/L 41 38 202(H)   CBC Latest Ref Rng & Units 08/31/2021 01/19/2021 12/05/2017  WBC 4.0 - 10.5 K/uL 9.6 8.1 7.0  Hemoglobin 12.0 - 15.0 g/dL 13.1 14.7 10.8(L)  Hematocrit 36.0 - 46.0 % 40.1 42.2 32.4(L)  Platelets 150 - 400 K/uL 194 184 199    Lipid Panel No results for input(s): CHOL, TRIG, LDLCALC, VLDL, HDL,  CHOLHDL, LDLDIRECT in the last 8760 hours.  HEMOGLOBIN A1C No results found for: HGBA1C, MPG TSH No results for input(s): TSH in the last 8760 hours.  External labs:  05/09/2021: BUN 23, creatinine 0.8, GFR >60, sodium 137, potassium 4.8 Total cholesterol 242, triglycerides 164, HDL 65, LDL 144 Hgb 13.4, HCT 39.7, MCV 96.1, platelet 150  Allergies   Allergies  Allergen Reactions   Demerol [Meperidine] Nausea Only   Nickel Rash   Tape Rash    Medications Prior to Visit:   Outpatient Medications Prior to Visit  Medication Sig Dispense Refill   acetaminophen (TYLENOL) 500 MG tablet Take 1,000 mg by mouth every 6 (six) hours as needed for moderate pain.     amLODipine (NORVASC) 10 MG tablet Take 10 mg by mouth daily.      ascorbic acid (VITAMIN C) 1000 MG tablet Take 1,000 mg by mouth daily.     aspirin EC 81 MG tablet Take 81 mg by mouth daily. Swallow whole.     CANNABIDIOL PO Take 2 capsules by mouth at bedtime.     Cholecalciferol (VITAMIN D-3) 125 MCG (5000 UT) TABS Take 1 tablet by mouth daily.     fexofenadine (ALLEGRA) 180 MG tablet Take 180 mg by mouth daily as needed for allergies.     hydrochlorothiazide (HYDRODIURIL) 25 MG tablet Take 25 mg by mouth daily.     inFLIXimab-abda (RENFLEXIS) 100 MG SOLR Inject 1 each into the vein every 8 (eight) weeks.     losartan (COZAAR) 100 MG tablet Take 100 mg by mouth daily.     Magnesium 250 MG TABS Take by mouth.     MELATONIN PO Take 1.5 mg by mouth at bedtime as needed (sleep).     meloxicam (MOBIC) 15 MG tablet Take 15 mg by mouth daily.     Multiple Vitamin (MULTIVITAMIN) tablet Take 1 tablet by mouth daily.     Omega-3 Fatty Acids (FISH OIL) 1200 MG CAPS Take 1,200 mg by mouth 2 (two) times daily.     omeprazole (PRILOSEC) 40 MG capsule Take 1 capsule (40 mg total) by mouth daily before breakfast. Take one pill 20-30 minutes before breakfast meal. 90 capsule 2   polycarbophil (FIBERCON) 625 MG tablet Take 625 mg by mouth  daily.     polyethylene glycol (MIRALAX / GLYCOLAX) 17 g packet Take 17 g by mouth every Monday, Wednesday, and Friday.     rosuvastatin (CRESTOR) 5 MG tablet Take 5 mg by mouth daily.     valACYclovir (VALTREX) 500 MG tablet Take 500 mg by mouth daily as needed.     fluocinonide ointment (LIDEX) 2.35 % Apply 1 application topically 2 (two) times daily. (Patient not taking: No sig reported) 30 g 0   folic acid (FOLVITE) 1 MG tablet Take 2 mg by mouth daily.     methocarbamol (ROBAXIN) 500 MG tablet Take 1 tablet (500 mg total) by mouth 4 (four) times daily. (Patient not taking: Reported on 08/31/2021) 45 tablet 0   oxyCODONE-acetaminophen (PERCOCET) 5-325 MG tablet Take 1 tablet by mouth every 4 (four) hours as needed for severe pain. (Patient not taking: Reported on 08/31/2021) 20 tablet 0   predniSONE (DELTASONE) 20 MG tablet Take 2 tablets (40 mg total) by mouth daily. (Patient not taking: No sig reported) 10 tablet 0   No facility-administered medications prior to visit.   Final Medications at End of Visit    Current Meds  Medication Sig   acetaminophen (TYLENOL) 500 MG tablet Take 1,000 mg by mouth every 6 (six) hours as needed for moderate pain.   amLODipine (NORVASC) 10 MG tablet Take 10 mg by mouth daily.  ascorbic acid (VITAMIN C) 1000 MG tablet Take 1,000 mg by mouth daily.   aspirin EC 81 MG tablet Take 81 mg by mouth daily. Swallow whole.   CANNABIDIOL PO Take 2 capsules by mouth at bedtime.   Cholecalciferol (VITAMIN D-3) 125 MCG (5000 UT) TABS Take 1 tablet by mouth daily.   fexofenadine (ALLEGRA) 180 MG tablet Take 180 mg by mouth daily as needed for allergies.   hydrochlorothiazide (HYDRODIURIL) 25 MG tablet Take 25 mg by mouth daily.   inFLIXimab-abda (RENFLEXIS) 100 MG SOLR Inject 1 each into the vein every 8 (eight) weeks.   losartan (COZAAR) 100 MG tablet Take 100 mg by mouth daily.   Magnesium 250 MG TABS Take by mouth.   MELATONIN PO Take 1.5 mg by mouth at bedtime  as needed (sleep).   meloxicam (MOBIC) 15 MG tablet Take 15 mg by mouth daily.   Multiple Vitamin (MULTIVITAMIN) tablet Take 1 tablet by mouth daily.   Omega-3 Fatty Acids (FISH OIL) 1200 MG CAPS Take 1,200 mg by mouth 2 (two) times daily.   omeprazole (PRILOSEC) 40 MG capsule Take 1 capsule (40 mg total) by mouth daily before breakfast. Take one pill 20-30 minutes before breakfast meal.   polycarbophil (FIBERCON) 625 MG tablet Take 625 mg by mouth daily.   polyethylene glycol (MIRALAX / GLYCOLAX) 17 g packet Take 17 g by mouth every Monday, Wednesday, and Friday.   rosuvastatin (CRESTOR) 5 MG tablet Take 5 mg by mouth daily.   valACYclovir (VALTREX) 500 MG tablet Take 500 mg by mouth daily as needed.    Radiology:   No results found.  Cardiac Studies:   Lexiscan Myoview Stress Test 04/27/2019: Stress EKG is non-diagnostic, as this is pharmacological stress test. Normal myocardial perfusion. Stress LV EF: 72%.  Low risk study.  Echocardiogram 05/19/2019:  Normal LV systolic function with EF 56%. Left ventricle cavity is normal in size. Mild concentric hypertrophy of the left ventricle. Normal global wall motion. Normal diastolic filling pattern. Calculated EF 56%.  Mild (Grade I) mitral regurgitation.  Inadequate TR jet to estimate pulmonary artery systolic pressure. Normal right atrial pressure.   EKG:   12/01/2020: Sinus rhythm at a rate of 90 bpm.  Normal axis.  Poor R wave progression, cannot exclude anteroseptal infarct old. No evidence of ischemia or injury.   Assessment     ICD-10-CM   1. Precordial pain  R07.2 PCV MYOCARDIAL PERFUSION WO LEXISCAN    PCV ECHOCARDIOGRAM COMPLETE    2. Exercise intolerance  R68.89 PCV MYOCARDIAL PERFUSION WO LEXISCAN    PCV ECHOCARDIOGRAM COMPLETE    3. Essential hypertension  I10        Medications Discontinued During This Encounter  Medication Reason   folic acid (FOLVITE) 1 MG tablet Error   methocarbamol (ROBAXIN) 500 MG tablet  Error   oxyCODONE-acetaminophen (PERCOCET) 5-325 MG tablet Error   predniSONE (DELTASONE) 20 MG tablet Error   fluocinonide ointment (LIDEX) 0.05 % Error    No orders of the defined types were placed in this encounter.   Recommendations:   Manasvini Whatley is a 70 y.o. Caucasian female with history of hypertension, hyperlipidemia, rheumatoid arthritis, IBS, family history of premature CAD.   Patient presents for evaluation of chest pain intermittent over the last 6 weeks as well as bilateral ankle edema and reduced exercise tolerance.  Given patient's symptoms and multiple cardiovascular risk factors will obtain nuclear stress test as well as echocardiogram.  Patient's blood pressure is well controlled.  I personally reviewed external labs, lipids not well controlled, PCP recently started patient on statin therapy.  Will defer lipid management to PCP at this time.  Patient's physical exam is unremarkable and EKG is stable without evidence of acute ischemic changes.  Further recommendations pending results of cardiac testing.   Alethia Berthold, PA-C 09/04/2021, 4:06 PM Office: [phone]

## 2021-09-13 ENCOUNTER — Ambulatory Visit: Payer: PPO | Admitting: Internal Medicine

## 2021-09-18 ENCOUNTER — Other Ambulatory Visit: Payer: PPO

## 2021-10-02 ENCOUNTER — Other Ambulatory Visit: Payer: Self-pay | Admitting: Gastroenterology

## 2021-10-02 ENCOUNTER — Other Ambulatory Visit: Payer: PPO

## 2021-10-02 DIAGNOSIS — K219 Gastro-esophageal reflux disease without esophagitis: Secondary | ICD-10-CM

## 2021-10-03 ENCOUNTER — Other Ambulatory Visit: Payer: PPO

## 2021-10-05 DIAGNOSIS — Z4689 Encounter for fitting and adjustment of other specified devices: Secondary | ICD-10-CM | POA: Diagnosis not present

## 2021-10-10 ENCOUNTER — Ambulatory Visit: Payer: PPO

## 2021-10-10 ENCOUNTER — Other Ambulatory Visit: Payer: PPO

## 2021-10-10 ENCOUNTER — Other Ambulatory Visit: Payer: Self-pay

## 2021-10-10 DIAGNOSIS — R072 Precordial pain: Secondary | ICD-10-CM

## 2021-10-10 DIAGNOSIS — R6889 Other general symptoms and signs: Secondary | ICD-10-CM

## 2021-10-10 LAB — PCV MYOCARDIAL PERFUSION WO LEXISCAN: ST Depression (mm): 0 mm

## 2021-10-16 ENCOUNTER — Ambulatory Visit: Payer: PPO | Admitting: Student

## 2021-10-18 DIAGNOSIS — M0579 Rheumatoid arthritis with rheumatoid factor of multiple sites without organ or systems involvement: Secondary | ICD-10-CM | POA: Diagnosis not present

## 2021-10-19 ENCOUNTER — Encounter: Payer: Self-pay | Admitting: Student

## 2021-10-19 ENCOUNTER — Other Ambulatory Visit: Payer: Self-pay

## 2021-10-19 ENCOUNTER — Ambulatory Visit: Payer: PPO | Admitting: Student

## 2021-10-19 VITALS — BP 121/66 | HR 71 | Temp 98.2°F | Ht 62.0 in | Wt 160.5 lb

## 2021-10-19 DIAGNOSIS — R072 Precordial pain: Secondary | ICD-10-CM | POA: Diagnosis not present

## 2021-10-19 DIAGNOSIS — R6889 Other general symptoms and signs: Secondary | ICD-10-CM | POA: Diagnosis not present

## 2021-10-19 MED ORDER — METOPROLOL SUCCINATE ER 25 MG PO TB24: 12.5000 mg | ORAL_TABLET | Freq: Every day | ORAL | 3 refills | Status: DC

## 2021-10-19 NOTE — Progress Notes (Signed)
Primary Physician/Referring:  Velna Hatchet, MD  Patient ID: Courtney Crane, female    DOB: [birth year], 70 y.o.   MRN: [id number]  Chief Complaint  Patient presents with   Chest Pain   Results   Follow-up   HPI:    Courtney Crane  is a 70 y.o. Caucasian female with history of GERD, hypertension, hyperlipidemia, rheumatoid arthritis, IBS, family history of premature CAD.    Patient presents for 6-week follow-up for results of cardiac testing.  Patient does continue to have chest pain which is worse with lying down, reticulate on her right side.  Given risk factors and his chest pain symptoms patient underwent Stress test which was overall low risk and echocardiogram which noted preserved LVEF without significant abnormality, unchanged compared to 2020.   Past Medical History:  Diagnosis Date   Anemia    Anxiety    Arthritis    Asthma    Cataract    Colon polyps    Complication of anesthesia    "sometimes I dont like to wake up after " ie difficult to awaken    Diverticulitis    Diverticulosis    Fatty liver    GERD (gastroesophageal reflux disease)    HLD (hyperlipidemia)    HTN (hypertension)    IBS (irritable bowel syndrome)    Insomnia    MRSA infection    Osteoarthritis    Osteopenia    PONV (postoperative nausea and vomiting)    RA (rheumatoid arthritis) (HCC)    Rheumatoid arthritis (HCC)    Seasonal allergies    Shingles    Status post dilation of esophageal narrowing    Vitamin D deficiency    Past Surgical History:  Procedure Laterality Date   APPENDECTOMY  1975   bladder tack  1990   CATARACT EXTRACTION, BILATERAL Bilateral    CHOLECYSTECTOMY N/A 01/31/2017   Procedure: LAPAROSCOPIC CHOLECYSTECTOMY WITH INTRAOPERATIVE CHOLANGIOGRAM;  Surgeon: Armandina Gemma, MD;  Location: [facility];  Service: General;  Laterality: N/A;   INCISION AND DRAINAGE PERIRECTAL ABSCESS N/A 12/02/2017   Procedure: IRRIGATION AND DEBRIDEMENT PERINEAL ABSCESS;  Surgeon: Greer Pickerel, MD;  Location: [facility] OR;  Service: General;  Laterality: N/A;   MENISCUS REPAIR Right 2013   ROTATOR CUFF REPAIR Right 2005   TONSILLECTOMY     as a child   TOTAL ABDOMINAL HYSTERECTOMY  1988   TOTAL KNEE ARTHROPLASTY Left    WRIST RECONSTRUCTION Right 2008   Family History  Problem Relation Age of Onset   Colon polyps Mother    Diabetes Mother    Heart disease Mother    Kidney cancer Mother    Arthritis Mother    Hypertension Mother    Hyperlipidemia Mother    Colon polyps Son    CAD Brother    Esophageal cancer Brother    Dementia Father    Colon cancer Neg Hx    Stomach cancer Neg Hx    Rectal cancer Neg Hx    Prostate cancer Neg Hx     Social History   Tobacco Use   Smoking status: Never   Smokeless tobacco: Never  Substance Use Topics   Alcohol use: Yes    Alcohol/week: 7.0 standard drinks    Types: 7 Standard drinks or equivalent per week    Comment: wine daily   Marital Status: Married   ROS  Review of Systems  Constitutional: Positive for malaise/fatigue. Negative for weight gain.  Cardiovascular:  Positive for chest pain and dyspnea on exertion.  Negative for claudication, leg swelling, near-syncope, orthopnea, palpitations, paroxysmal nocturnal dyspnea and syncope.  Neurological:  Negative for dizziness.   Objective  Blood pressure 121/66, pulse 71, temperature 98.2 F (36.8 C), height 5\' 2"  (1.575 m), weight 160 lb 8 oz (72.8 kg), SpO2 98 %.  Vitals with BMI 10/19/2021 09/04/2021 08/31/2021  Height 5\' 2"  5\' 2"  -  Weight 160 lbs 8 oz 159 lbs -  BMI 76.81 15.72 -  Systolic 620 355 974  Diastolic 66 59 56  Pulse 71 76 85      Physical Exam Vitals reviewed.  HENT:     Head: Normocephalic and atraumatic.  Cardiovascular:     Rate and Rhythm: Normal rate and regular rhythm.     Pulses: Intact distal pulses.     Heart sounds: S1 normal and S2 normal. No murmur heard.   No gallop.  Pulmonary:     Effort: Pulmonary effort is normal. No  respiratory distress.     Breath sounds: No wheezing, rhonchi or rales.  Musculoskeletal:     Right lower leg: No edema.     Left lower leg: No edema.  Neurological:     Mental Status: She is alert.  Physical exam unchanged compared to previous office visit.  Laboratory examination:   Recent Labs    01/19/21 1400 08/31/21 1352  NA 136 136  K 3.3* 3.5  CL 98 101  CO2 24 23  GLUCOSE 89 100*  BUN 20 16  CREATININE 0.85 1.01*  CALCIUM 9.6 9.5  GFRNONAA >60 60*   CrCl cannot be calculated (Patient's most recent lab result is older than the maximum 21 days allowed.).  CMP Latest Ref Rng & Units 08/31/2021 01/19/2021 12/05/2017  Glucose 70 - 99 mg/dL 100(H) 89 97  BUN 8 - 23 mg/dL 16 20 14   Creatinine 0.44 - 1.00 mg/dL 1.01(H) 0.85 0.82  Sodium 135 - 145 mmol/L 136 136 139  Potassium 3.5 - 5.1 mmol/L 3.5 3.3(L) 3.9  Chloride 98 - 111 mmol/L 101 98 105  CO2 22 - 32 mmol/L 23 24 24   Calcium 8.9 - 10.3 mg/dL 9.5 9.6 9.0  Total Protein 6.5 - 8.1 g/dL 7.2 8.5(H) 6.3(L)  Total Bilirubin 0.3 - 1.2 mg/dL 0.6 0.8 0.5  Alkaline Phos 38 - 126 U/L 49 48 112  AST 15 - 41 U/L 42(H) 32 162(H)  ALT 0 - 44 U/L 41 38 202(H)   CBC Latest Ref Rng & Units 08/31/2021 01/19/2021 12/05/2017  WBC 4.0 - 10.5 K/uL 9.6 8.1 7.0  Hemoglobin 12.0 - 15.0 g/dL 13.1 14.7 10.8(L)  Hematocrit 36.0 - 46.0 % 40.1 42.2 32.4(L)  Platelets 150 - 400 K/uL 194 184 199    Lipid Panel No results for input(s): CHOL, TRIG, LDLCALC, VLDL, HDL, CHOLHDL, LDLDIRECT in the last 8760 hours.  HEMOGLOBIN A1C No results found for: HGBA1C, MPG TSH No results for input(s): TSH in the last 8760 hours.  External labs:   05/09/2021: BUN 23, creatinine 0.8, GFR >60, sodium 137, potassium 4.8 Total cholesterol 242, triglycerides 164, HDL 65, LDL 144 Hgb 13.4, HCT 39.7, MCV 96.1, platelet 150  Allergies   Allergies  Allergen Reactions   Methotrexate Other (See Comments)   Demerol [Meperidine] Nausea Only   Nickel Rash    Tape Rash and Other (See Comments)    Medications Prior to Visit:   Outpatient Medications Prior to Visit  Medication Sig Dispense Refill   acetaminophen (TYLENOL) 500 MG tablet Take 1,000 mg by mouth  every 6 (six) hours as needed for moderate pain.     amLODipine (NORVASC) 10 MG tablet Take 10 mg by mouth daily.      ascorbic acid (VITAMIN C) 1000 MG tablet Take 1,000 mg by mouth daily.     aspirin EC 81 MG tablet Take 81 mg by mouth daily. Swallow whole.     CANNABIDIOL PO Take 2 capsules by mouth at bedtime.     Cholecalciferol (VITAMIN D-3) 125 MCG (5000 UT) TABS Take 1 tablet by mouth daily.     fexofenadine (ALLEGRA) 180 MG tablet Take 180 mg by mouth daily as needed for allergies.     hydrochlorothiazide (HYDRODIURIL) 25 MG tablet Take 25 mg by mouth daily.     inFLIXimab-abda (RENFLEXIS) 100 MG SOLR Inject 1 each into the vein every 8 (eight) weeks.     losartan (COZAAR) 100 MG tablet Take 100 mg by mouth daily.     Magnesium 250 MG TABS Take by mouth.     MELATONIN PO Take 1.5 mg by mouth at bedtime as needed (sleep).     meloxicam (MOBIC) 15 MG tablet Take 15 mg by mouth daily.     Multiple Vitamin (MULTIVITAMIN) tablet Take 1 tablet by mouth daily.     Omega-3 Fatty Acids (FISH OIL) 1200 MG CAPS Take 1,200 mg by mouth 2 (two) times daily.     omeprazole (PRILOSEC) 40 MG capsule TAKE 1 CAPSULE BY MOUTH DAILY BEFORE BREAKFAST. TAKE ONE PILL 20-30 MINUTES BEFORE BREAKFAST MEAL. 90 capsule 2   polycarbophil (FIBERCON) 625 MG tablet Take 625 mg by mouth daily.     polyethylene glycol (MIRALAX / GLYCOLAX) 17 g packet Take 17 g by mouth every Monday, Wednesday, and Friday.     rosuvastatin (CRESTOR) 5 MG tablet Take 5 mg by mouth daily.     valACYclovir (VALTREX) 500 MG tablet Take 500 mg by mouth daily as needed.     melatonin 1 MG TABS tablet Take 1 mg by mouth as needed.     No facility-administered medications prior to visit.   Final Medications at End of Visit    Current Meds   Medication Sig   acetaminophen (TYLENOL) 500 MG tablet Take 1,000 mg by mouth every 6 (six) hours as needed for moderate pain.   amLODipine (NORVASC) 10 MG tablet Take 10 mg by mouth daily.    ascorbic acid (VITAMIN C) 1000 MG tablet Take 1,000 mg by mouth daily.   aspirin EC 81 MG tablet Take 81 mg by mouth daily. Swallow whole.   CANNABIDIOL PO Take 2 capsules by mouth at bedtime.   Cholecalciferol (VITAMIN D-3) 125 MCG (5000 UT) TABS Take 1 tablet by mouth daily.   fexofenadine (ALLEGRA) 180 MG tablet Take 180 mg by mouth daily as needed for allergies.   hydrochlorothiazide (HYDRODIURIL) 25 MG tablet Take 25 mg by mouth daily.   inFLIXimab-abda (RENFLEXIS) 100 MG SOLR Inject 1 each into the vein every 8 (eight) weeks.   losartan (COZAAR) 100 MG tablet Take 100 mg by mouth daily.   Magnesium 250 MG TABS Take by mouth.   MELATONIN PO Take 1.5 mg by mouth at bedtime as needed (sleep).   meloxicam (MOBIC) 15 MG tablet Take 15 mg by mouth daily.   metoprolol succinate (TOPROL-XL) 25 MG 24 hr tablet Take 0.5 tablets (12.5 mg total) by mouth daily. Take with or immediately following a meal.   Multiple Vitamin (MULTIVITAMIN) tablet Take 1 tablet by mouth daily.  Omega-3 Fatty Acids (FISH OIL) 1200 MG CAPS Take 1,200 mg by mouth 2 (two) times daily.   omeprazole (PRILOSEC) 40 MG capsule TAKE 1 CAPSULE BY MOUTH DAILY BEFORE BREAKFAST. TAKE ONE PILL 20-30 MINUTES BEFORE BREAKFAST MEAL.   polycarbophil (FIBERCON) 625 MG tablet Take 625 mg by mouth daily.   polyethylene glycol (MIRALAX / GLYCOLAX) 17 g packet Take 17 g by mouth every Monday, Wednesday, and Friday.   rosuvastatin (CRESTOR) 5 MG tablet Take 5 mg by mouth daily.   valACYclovir (VALTREX) 500 MG tablet Take 500 mg by mouth daily as needed.    Radiology:   No results found.  Cardiac Studies:   PC[facility] ECHOCARDIOGRAM COMPLETE 27/04/2375 Normal LV systolic function with visual EF 60-65%. Left ventricle cavity is normal in size. Mild left  ventricular hypertrophy. Normal global wall motion. Normal diastolic filling pattern, normal LAP. No significant valvular heart disease. Compared to study 05/19/2019 no significant change.  PCV MYOCARDIAL PERFUSION WO LEXISCAN 10/10/2021 Normal ECG stress. The patient exercised for 3 minutes and 30 seconds of a Bruce protocol, achieving approximately 4.64 METs. During exercise the patient developed chest pain, and experienced dyspnea and dizziness. Exercise was terminated due to fatigue/weakness.  The calculated Duke Treadmill Score is -0.5. Normal BP response. Myocardial perfusion is normal. Diaphragmatic attenuation noted. Overall LV systolic function is normal without regional wall motion abnormalities. Stress LV EF: 66%. No previous exam available for comparison. low risk  EKG:   12/01/2020: Sinus rhythm at a rate of 90 bpm.  Normal axis.  Poor R wave progression, cannot exclude anteroseptal infarct old. No evidence of ischemia or injury.   Assessment     ICD-10-CM   1. Precordial pain  R07.2     2. Exercise intolerance  R68.89         There are no discontinued medications.   Meds ordered this encounter  Medications   metoprolol succinate (TOPROL-XL) 25 MG 24 hr tablet    Sig: Take 0.5 tablets (12.5 mg total) by mouth daily. Take with or immediately following a meal.    Dispense:  15 tablet    Refill:  3     Recommendations:   Courtney Crane is a 70 y.o. Caucasian female with history of hypertension, hyperlipidemia, rheumatoid arthritis, IBS, family history of premature CAD.   Reviewed and discussed with patient and her husband who was present at bedside results of echocardiogram and stress test, details above.  Echocardiogram was without significant abnormalities and stress test overall low risk.  Suspect patient's symptoms are noncardiac.  However given underlying risk factors shared decision was to proceed with initiation of low-dose beta-blocker therapy with  metoprolol 12.5 mg once daily.  Advised patient to follow-up with PCP for further evaluation of symptoms, suspect may be related to underlying GERD.  Follow-up in 3 months, sooner if needed   Alethia Berthold, PA-C 10/19/2021, 3:26 PM Office: [phone]

## 2021-10-25 DIAGNOSIS — J209 Acute bronchitis, unspecified: Secondary | ICD-10-CM | POA: Diagnosis not present

## 2021-10-25 DIAGNOSIS — G441 Vascular headache, not elsewhere classified: Secondary | ICD-10-CM | POA: Diagnosis not present

## 2021-10-25 DIAGNOSIS — R0989 Other specified symptoms and signs involving the circulatory and respiratory systems: Secondary | ICD-10-CM | POA: Diagnosis not present

## 2021-11-07 DIAGNOSIS — M150 Primary generalized (osteo)arthritis: Secondary | ICD-10-CM | POA: Diagnosis not present

## 2021-11-07 DIAGNOSIS — E663 Overweight: Secondary | ICD-10-CM | POA: Diagnosis not present

## 2021-11-07 DIAGNOSIS — M0579 Rheumatoid arthritis with rheumatoid factor of multiple sites without organ or systems involvement: Secondary | ICD-10-CM | POA: Diagnosis not present

## 2021-11-07 DIAGNOSIS — Z6829 Body mass index (BMI) 29.0-29.9, adult: Secondary | ICD-10-CM | POA: Diagnosis not present

## 2021-11-07 DIAGNOSIS — Z79899 Other long term (current) drug therapy: Secondary | ICD-10-CM | POA: Diagnosis not present

## 2021-11-07 DIAGNOSIS — M2550 Pain in unspecified joint: Secondary | ICD-10-CM | POA: Diagnosis not present

## 2021-11-27 ENCOUNTER — Encounter: Payer: Self-pay | Admitting: Physician Assistant

## 2021-11-27 ENCOUNTER — Ambulatory Visit: Payer: PPO | Admitting: Physician Assistant

## 2021-11-27 ENCOUNTER — Other Ambulatory Visit (INDEPENDENT_AMBULATORY_CARE_PROVIDER_SITE_OTHER): Payer: PPO

## 2021-11-27 VITALS — BP 122/60 | HR 72 | Ht 62.0 in | Wt 160.0 lb

## 2021-11-27 DIAGNOSIS — R1010 Upper abdominal pain, unspecified: Secondary | ICD-10-CM

## 2021-11-27 DIAGNOSIS — K219 Gastro-esophageal reflux disease without esophagitis: Secondary | ICD-10-CM | POA: Diagnosis not present

## 2021-11-27 DIAGNOSIS — R079 Chest pain, unspecified: Secondary | ICD-10-CM | POA: Diagnosis not present

## 2021-11-27 DIAGNOSIS — R0602 Shortness of breath: Secondary | ICD-10-CM

## 2021-11-27 LAB — BASIC METABOLIC PANEL
BUN: 23 mg/dL (ref 6–23)
CO2: 28 mEq/L (ref 19–32)
Calcium: 9.7 mg/dL (ref 8.4–10.5)
Chloride: 100 mEq/L (ref 96–112)
Creatinine, Ser: 0.9 mg/dL (ref 0.40–1.20)
GFR: 64.7 mL/min (ref >60.00)
Glucose, Bld: 98 mg/dL (ref 70–99)
Potassium: 4.1 mEq/L (ref 3.5–5.1)
Sodium: 136 mEq/L (ref 135–145)

## 2021-11-27 LAB — CBC WITH DIFFERENTIAL/PLATELET
Basophils Absolute: 0.1 10*3/uL (ref 0.0–0.1)
Basophils Relative: 1 % (ref 0.0–3.0)
Eosinophils Absolute: 0.3 10*3/uL (ref 0.0–0.7)
Eosinophils Relative: 4.4 % (ref 0.0–5.0)
HCT: 39.2 % (ref 36.0–46.0)
Hemoglobin: 12.9 g/dL (ref 12.0–15.0)
Lymphocytes Relative: 42.9 % (ref 12.0–46.0)
Lymphs Abs: 2.6 10*3/uL (ref 0.7–4.0)
MCHC: 32.9 g/dL (ref 30.0–36.0)
MCV: 93 fl (ref 78.0–100.0)
Monocytes Absolute: 0.8 10*3/uL (ref 0.1–1.0)
Monocytes Relative: 13.3 % — ABNORMAL HIGH (ref 3.0–12.0)
Neutro Abs: 2.4 10*3/uL (ref 1.4–7.7)
Neutrophils Relative %: 38.4 % — ABNORMAL LOW (ref 43.0–77.0)
Platelets: 182 10*3/uL (ref 150.0–400.0)
RBC: 4.21 Mil/uL (ref 3.87–5.11)
RDW: 15.2 % (ref 11.5–15.5)
WBC: 6.2 10*3/uL (ref 4.0–10.5)

## 2021-11-27 MED ORDER — OMEPRAZOLE 40 MG PO CPDR: DELAYED_RELEASE_CAPSULE | ORAL | 3 refills | Status: DC

## 2021-11-27 MED ORDER — FAMOTIDINE 40 MG PO TABS: 40.0000 mg | ORAL_TABLET | Freq: Every day | ORAL | 3 refills | Status: DC

## 2021-11-27 NOTE — Progress Notes (Signed)
Subjective:    Patient ID: Courtney Crane, female    DOB: Dec 08, 1950, 71 y.o.   MRN: [id number]  HPI Carmyn is a pleasant 71 year old white female, established with Dr. Ardis Hughs.  She has history of diverticulosis, GERD, is status post prior cholecystectomy, hypertension, and history of rheumatoid arthritis for which she is on infliximab. She comes in today for evaluation after onset of current symptoms in September 2022.  She says she has been taking omeprazole 40 mg chronically long-term which has worked well to control her symptoms. She had undergone a back surgery in March 2022, says that she got through that fairly well and had tried to get back to the gym to exercise.  In September she started experiencing shortness of breath with exercise would be associated with chest pressure to the point that she had to stop exercising.  She had also started having those symptoms occurring at other times not necessarily exertional.  She was seen by her rheumatologist then her PCP, was eventually told to go to the hospital with complaints of chest pain and underwent work-up through the emergency room including CT angiography in October 2002 2 which was negative, and had negative troponins etc.  She was then referred to cardiology and has undergone 2D echo and myocardial perfusion study which was found to be low risk.  2D echo showed preserved EF. She did have chest pain and shortness of breath as well as dizziness with exercise during the perfusion study.  She has been started on metoprolol low-dose by cardiology. She says she continues to have persistent symptoms She also has episodes occurring nocturnally with chest pressure that will wake her from sleep.  These episodes are not associated with shortness of breath She says she usually gets up takes some Tums and then tries to reposition herself. Over the past couple of months she will have intermittent symptoms throughout the day and says now she is to the  point that she cannot walk more than 1/4 mile or so due to onset of chest pressure and shortness of breath to the point that she has to sit down and rest.  She has even had episodes of feeling short of breath after minimal exertion like taking a shower. She does believe that a component of this may be related to heartburn because she is found herself taking more Tums, and sometimes will take a second dose of omeprazole in the evening.  She does not have any complaints of dysphagia or odynophagia.  She does take meloxicam 15 mg most days. Last EGD was done in March 2021, no hiatal hernia was noted to have mild gastropathy and biopsy showed reactive gastritis, no H. pylori and no intestinal metaplasia. Last colonoscopy November 2021 with multiple diverticuli in the left colon, small internal hemorrhoids and no polyps.  Review of Systems Pertinent positive and negative review of systems were noted in the above HPI section.  All other review of systems was otherwise negative.   Outpatient Encounter Medications as of 11/27/2021  Medication Sig   acetaminophen (TYLENOL) 500 MG tablet Take 1,000 mg by mouth every 6 (six) hours as needed for moderate pain.   amLODipine (NORVASC) 10 MG tablet Take 10 mg by mouth daily.    ascorbic acid (VITAMIN C) 1000 MG tablet Take 1,000 mg by mouth daily.   aspirin EC 81 MG tablet Take 81 mg by mouth daily. Swallow whole.   CANNABIDIOL PO Take 2 capsules by mouth at bedtime.   Cholecalciferol (VITAMIN  D-3) 125 MCG (5000 UT) TABS Take 1 tablet by mouth daily.   famotidine (PEPCID) 40 MG tablet Take 1 tablet (40 mg total) by mouth at bedtime.   fexofenadine (ALLEGRA) 180 MG tablet Take 180 mg by mouth daily as needed for allergies.   hydrochlorothiazide (HYDRODIURIL) 25 MG tablet Take 25 mg by mouth daily.   inFLIXimab-abda (RENFLEXIS) 100 MG SOLR Inject 1 each into the vein every 8 (eight) weeks.   losartan (COZAAR) 100 MG tablet Take 100 mg by mouth daily.   Magnesium  250 MG TABS Take by mouth.   melatonin 1 MG TABS tablet Take 1 mg by mouth as needed.   MELATONIN PO Take 1.5 mg by mouth at bedtime as needed (sleep).   meloxicam (MOBIC) 15 MG tablet Take 15 mg by mouth daily.   metoprolol succinate (TOPROL-XL) 25 MG 24 hr tablet Take 0.5 tablets (12.5 mg total) by mouth daily. Take with or immediately following a meal.   Multiple Vitamin (MULTIVITAMIN) tablet Take 1 tablet by mouth daily.   Omega-3 Fatty Acids (FISH OIL) 1200 MG CAPS Take 1,200 mg by mouth 2 (two) times daily.   polycarbophil (FIBERCON) 625 MG tablet Take 625 mg by mouth daily.   polyethylene glycol (MIRALAX / GLYCOLAX) 17 g packet Take 17 g by mouth every Monday, Wednesday, and Friday.   rosuvastatin (CRESTOR) 5 MG tablet Take 5 mg by mouth daily.   valACYclovir (VALTREX) 500 MG tablet Take 500 mg by mouth daily as needed.   [DISCONTINUED] omeprazole (PRILOSEC) 40 MG capsule TAKE 1 CAPSULE BY MOUTH DAILY BEFORE BREAKFAST. TAKE ONE PILL 20-30 MINUTES BEFORE BREAKFAST MEAL.   omeprazole (PRILOSEC) 40 MG capsule TAKE 1 CAPSULE BY MOUTH DAILY BEFORE BREAKFAST. TAKE ONE PILL 20-30 MINUTES BEFORE BREAKFAST MEAL.   No facility-administered encounter medications on file as of 11/27/2021.   Allergies  Allergen Reactions   Methotrexate Other (See Comments)   Demerol [Meperidine] Nausea Only   Nickel Rash   Tape Rash and Other (See Comments)   Patient Active Problem List   Diagnosis Date Noted   S/P lumbar fusion 01/23/2021   Dysphagia 01/19/2020   DOE (dyspnea on exertion) 04/09/2019   Exertional chest pain 04/09/2019   Claudication ([place]) 04/09/2019   Leukocytosis 12/04/2017   AKI (acute kidney injury) ([place]) 11/29/2017   Rheumatoid arthritis ([place]) 11/29/2017   Cellulitis and abscess of buttock 11/28/2017   Cholelithiasis with chronic cholecystitis 01/30/2017   History of shingles 06/06/2016   Genital herpes simplex 06/06/2016   Seasonal allergies 06/06/2016   Seborrheic dermatitis     Prediabetes    Anemia    GERD (gastroesophageal reflux disease)    Vitamin D deficiency    Insomnia, unspecified    Essential hypertension    Social History   Socioeconomic History   Marital status: Married    Spouse name: Not on file   Number of children: 2   Years of education: Not on file   Highest education level: Not on file  Occupational History   Occupation: retired  Tobacco Use   Smoking status: Never   Smokeless tobacco: Never  Vaping Use   Vaping Use: Never used  Substance and Sexual Activity   Alcohol use: Yes    Alcohol/week: 7.0 standard drinks    Types: 7 Standard drinks or equivalent per week    Comment: wine daily   Drug use: No   Sexual activity: Not on file  Other Topics Concern   Not on file  Social History Narrative   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 Marcantel's family history includes Arthritis in her mother; CAD in her brother; Colon polyps in her mother and son; Dementia in her father; Diabetes in her mother; Esophageal cancer in her brother; Heart disease in her mother; Hyperlipidemia in her mother; Hypertension in her mother; Kidney cancer in her mother.      Objective:    Vitals:   11/27/21 1005  BP: 122/60  Pulse: 72    Physical Exam Well-developed well-nourished older white female in no acute distress.  Height, Weight, 160 BMI 29.26  HEENT; nontraumatic normocephalic, EOMI, PE R LA, sclera anicteric. Oropharynx; not examined today Neck; supple, no JVD Cardiovascular; regular rate and rhythm with S1-S2, no murmur rub or gallop Pulmonary; Clear bilaterally-she is tender at  the xiphoid process and left greater than right costal margins Abdomen; soft, there is tenderness in the epigastrium and left upper quadrant no guarding nondistended, no palpable mass or hepatosplenomegaly, bowel sounds are active Rectal; not done today Skin; benign exam, no jaundice rash or appreciable lesions Extremities; no clubbing cyanosis or edema skin warm and dry Neuro/Psych; alert and oriented x4, grossly nonfocal mood and affect appropriate        Assessment & Plan:   #73 71 year old white female with 45-monthhistory of new onset of primarily exertional chest pressure and shortness of breath.  Patient also has some of the symptoms occurring with rest but more prominent with exertion with inability to walk more than 1/4 mile without stopping due to symptoms, sometimes noticing shortness of breath even with minimal exertion i.e. showering. Recent CT angiography was negative of the chest Very recent cardiac work-up with 2D echo and myocardial perfusion study both negative, preserved EF  I am not convinced that her symptoms are of GI etiology.  She does have history of chronic GERD, and could be experiencing some chest discomfort secondary to poorly controlled GERD though I do not think this is responsible for any exertional dyspnea. On exam today patient does have tenderness along the costal margins left greater than right, and at the xiphoid process.  A component of her pain may be musculoskeletal in the setting of known rheumatoid arthritis, query costochondritis.  Will also need to rule out underlying lung disease.  She also has tenderness in the upper abdomen on exam, of unclear etiology  #2 rheumatoid arthritis on infliximab 3.  Status postcholecystectomy 4.  Hypertension 5.  Diverticulosis  Plan; continue omeprazole 40 mg p.o. every morning AC breakfast Add Pepcid/famotidine 40 mg p.o. every afternoon AC dinner We discussed an antireflux regimen, she was given a copy of an  antireflux diet and we discussed n.p.o. prior to bedtime for 2 to 3 hours and elevation of the back 45 degrees for sleep. I do not think repeat EGD is indicated at this time but can be considered if pulmonary evaluation is negative and CT abdomen pelvis unrevealing. CBC with differential and be met Scheduled for CT scan of the abdomen and pelvis for further evaluation of the upper abdominal  tenderness/discomfort Patient will also be referred to pulmonology for pulmonary function testing etc.  Ellayna Hilligoss S Sanyiah Kanzler PA-C 11/27/2021   Cc: Velna Hatchet, MD

## 2021-11-27 NOTE — Patient Instructions (Signed)
If you are age 71 or older, your body mass index should be between 23-30. Your Body mass index is 29.26 kg/m. If this is out of the aforementioned range listed, please consider follow up with your Primary Care Provider.  You have been scheduled for a CT scan of the abdomen and pelvis at [facility] ([address]---this is in the same building as [employer]).   You are scheduled on 12/07/2021 at 3:30 pm. You should arrive 15 minutes prior to your appointment time for registration. Please follow the written instructions below on the day of your exam:  WARNING: IF YOU ARE ALLERGIC TO IODINE/X-RAY DYE, PLEASE NOTIFY RADIOLOGY IMMEDIATELY AT [phone]! YOU WILL BE GIVEN A 13 HOUR PREMEDICATION PREP.  1) Do not eat anything after 11:30 am (4 hours prior to your test) 2) You have been given 2 bottles of oral contrast to drink. The solution may taste better if refrigerated, but do NOT add ice or any other liquid to this solution. Shake well before drinking.    Drink 1 bottle of contrast @ 1:30 pm (2 hours prior to your exam)  Drink 1 bottle of contrast @ 2:30 pm (1 hour prior to your exam)  You may take any medications as prescribed with a small amount of water, if necessary. If you take any of the following medications: METFORMIN, GLUCOPHAGE, GLUCOVANCE, AVANDAMET, RIOMET, FORTAMET, [place] MET, JANUMET, GLUMETZA or METAGLIP, you MAY be asked to HOLD this medication 48 hours AFTER the exam.  The purpose of you drinking the oral contrast is to aid in the visualization of your intestinal tract. The contrast solution may cause some diarrhea. Depending on your individual set of symptoms, you may also receive an intravenous injection of x-ray contrast/dye. Plan on being at [facility] for 30 minutes or longer, depending on the type of exam you are having performed.  This test typically takes 30-45 minutes to complete.  If you have any questions regarding your exam or if  you need to reschedule, you may call the CT department at [phone] between the hours of 8:00 am and 5:00 pm, Monday-Friday.  ____________________________________________________________  Continue Omeprazole 40 mg  START Famotidine 40 mg 1 tablet after dinner  Use Voltaren gel for rib pain as needed.  We have sent a referral to Pulmonology. Please allow 2-3 weeks for them to contact you with an appointment.  Follow up pending at this time.  Thank you for entrusting me with your care and choosing [facility].  Amy Esterwood, PA-C

## 2021-11-28 NOTE — Progress Notes (Signed)
I agree with the above note, plan 

## 2021-11-29 DIAGNOSIS — M0579 Rheumatoid arthritis with rheumatoid factor of multiple sites without organ or systems involvement: Secondary | ICD-10-CM | POA: Diagnosis not present

## 2021-12-07 ENCOUNTER — Other Ambulatory Visit: Payer: Self-pay

## 2021-12-07 ENCOUNTER — Ambulatory Visit (INDEPENDENT_AMBULATORY_CARE_PROVIDER_SITE_OTHER)
Admission: RE | Admit: 2021-12-07 | Discharge: 2021-12-07 | Disposition: A | Discharge status: Home / Self Care | Payer: PPO | Source: Ambulatory Visit | Attending: Physician Assistant | Admitting: Physician Assistant

## 2021-12-07 DIAGNOSIS — K219 Gastro-esophageal reflux disease without esophagitis: Secondary | ICD-10-CM

## 2021-12-07 DIAGNOSIS — R1010 Upper abdominal pain, unspecified: Secondary | ICD-10-CM | POA: Diagnosis not present

## 2021-12-07 DIAGNOSIS — R079 Chest pain, unspecified: Secondary | ICD-10-CM

## 2021-12-07 DIAGNOSIS — I700 Atherosclerosis of aorta: Secondary | ICD-10-CM | POA: Diagnosis not present

## 2021-12-07 DIAGNOSIS — K760 Fatty (change of) liver, not elsewhere classified: Secondary | ICD-10-CM | POA: Diagnosis not present

## 2021-12-07 DIAGNOSIS — R0602 Shortness of breath: Secondary | ICD-10-CM

## 2021-12-07 DIAGNOSIS — R109 Unspecified abdominal pain: Secondary | ICD-10-CM | POA: Diagnosis not present

## 2021-12-07 MED ORDER — IOHEXOL 300 MG/ML  SOLN
100.0000 mL | Freq: Once | INTRAMUSCULAR | Status: AC | PRN
  Administered 2021-12-07: 100 mL via INTRAVENOUS

## 2021-12-08 ENCOUNTER — Other Ambulatory Visit: Payer: Self-pay

## 2021-12-08 MED ORDER — AMOXICILLIN-POT CLAVULANATE 500-125 MG PO TABS: 1.0000 | ORAL_TABLET | Freq: Two times a day (BID) | ORAL | 0 refills | Status: AC

## 2021-12-13 ENCOUNTER — Ambulatory Visit: Payer: PPO | Admitting: Obstetrics and Gynecology

## 2021-12-13 ENCOUNTER — Encounter: Payer: Self-pay | Admitting: Obstetrics and Gynecology

## 2021-12-13 ENCOUNTER — Other Ambulatory Visit: Payer: Self-pay

## 2021-12-13 VITALS — BP 127/74 | HR 86 | Ht 62.5 in | Wt 160.0 lb

## 2021-12-13 DIAGNOSIS — R350 Frequency of micturition: Secondary | ICD-10-CM | POA: Diagnosis not present

## 2021-12-13 DIAGNOSIS — N393 Stress incontinence (female) (male): Secondary | ICD-10-CM | POA: Diagnosis not present

## 2021-12-13 DIAGNOSIS — N816 Rectocele: Secondary | ICD-10-CM | POA: Diagnosis not present

## 2021-12-13 DIAGNOSIS — N3281 Overactive bladder: Secondary | ICD-10-CM | POA: Diagnosis not present

## 2021-12-13 LAB — POCT URINALYSIS DIPSTICK
Appearance: NORMAL
Bilirubin, UA: NEGATIVE
Glucose, UA: NEGATIVE
Ketones, UA: NEGATIVE
Leukocytes, UA: NEGATIVE
Nitrite, UA: NEGATIVE
Protein, UA: NEGATIVE
Spec Grav, UA: 1.01 (ref 1.010–1.025)
Urobilinogen, UA: 0.2 E.U./dL
pH, UA: 6 (ref 5.0–8.0)

## 2021-12-13 MED ORDER — MIRABEGRON ER 25 MG PO TB24: 25.0000 mg | ORAL_TABLET | Freq: Every day | ORAL | 5 refills | Status: DC

## 2021-12-13 NOTE — Patient Instructions (Signed)
We discussed the symptoms of overactive bladder (OAB), which include urinary urgency, urinary frequency, night-time urination, with or without urge incontinence.  We discussed management including behavioral therapy (decreasing bladder irritants by following a bladder diet, urge suppression strategies, timed voids, bladder retraining), physical therapy, medication; and for refractory cases posterior tibial nerve stimulation, sacral neuromodulation, and intravesical botulinum toxin injection.   For Beta-3 agonist medication, we discussed the potential side effect of elevated blood pressure which is more likely to occur in individuals with uncontrolled hypertension. You were given Myrbetriq 25 mg.  It can take a month to start working so give it time, but if you have bothersome side effects call sooner and we can try a different medication.  Call us if you have trouble filling the prescription or if it's not covered by your insurance.  For treatment of stress urinary incontinence, which is leakage with physical activity/movement/strainging/coughing, we discussed expectant management versus nonsurgical options versus surgery. Nonsurgical options include weight loss, physical therapy, as well as a pessary.  Surgical options include a midurethral sling, which is a synthetic mesh sling that acts like a hammock under the urethra to prevent leakage of urine, and transurethral injection of a bulking agent.

## 2021-12-13 NOTE — Progress Notes (Signed)
[facility] New Patient Evaluation and Consultation  Referring Provider: Kerry Dory, NP PCP: Velna Hatchet, MD Date of Service: 12/13/2021  SUBJECTIVE Chief Complaint: New Patient (Initial Visit)  History of Present Illness: Courtney Crane is a 71 y.o. White or Caucasian female seen in consultation at the request of NP Rubbie Battiest for evaluation of prolapse.    Review of records from NP Chrzanowski significant for: Has a cystocele. Was using a #3 ring with support pessary.   Urinary Symptoms: Leaks urine with cough/ sneeze, laughing, exercise, lifting, going from sitting to standing, with a full bladder, with movement to the bathroom, and with urgency SUI = UUI. Leaks 4-5 time(s) per day Pad use: several pads per day.   She is bothered by her UI symptoms. Had a sling- worked for several years, but no longer working over the last 10 years.  Tried physical therapy.  Tried a ring pessary but this did not help her symptoms.   Day time voids 10.  Nocturia: 4-7 times per night to void. Voiding dysfunction: she empties her bladder well.  does not use a catheter to empty bladder.  When urinating, she feels dribbling after finishing and the need to urinate multiple times in a row Drinks: a couple cups coffee in AM, water, glass of wine at dinner  Tries not to drink a lot before bedtime (usually just a sip).  She snores- never had a sleep study.   UTIs:  0  UTI's in the last year.   Denies history of blood in urine and kidney or bladder stones  Pelvic Organ Prolapse Symptoms:                  She Denies a feeling of a bulge the vaginal area.   Bowel Symptom: Bowel movements: daily Stool consistency: soft  Straining: no.  Splinting: no.  Incomplete evacuation: no.  She Denies accidental bowel leakage / fecal incontinence Bowel regimen: diet, fiber, and miralax Last colonoscopy: Date 2021, Results - neg  Sexual Function Sexually active: yes.   Sexual orientation:  heterosexual Pain with sex: No  Pelvic Pain Denies pelvic pain   Past Medical History:  Past Medical History:  Diagnosis Date   Anemia    Anxiety    Arthritis    Asthma    Cataract    Colon polyps    Complication of anesthesia    "sometimes I dont like to wake up after " ie difficult to awaken    Diverticulitis    Diverticulosis    Fatty liver    GERD (gastroesophageal reflux disease)    HLD (hyperlipidemia)    HTN (hypertension)    IBS (irritable bowel syndrome)    Insomnia    MRSA infection    Osteoarthritis    Osteopenia    PONV (postoperative nausea and vomiting)    RA (rheumatoid arthritis) (HCC)    Rheumatoid arthritis (HCC)    Seasonal allergies    Shingles    Status post dilation of esophageal narrowing    Vitamin D deficiency      Past Surgical History:   Past Surgical History:  Procedure Laterality Date   APPENDECTOMY  1975   bladder tack  1990   CATARACT EXTRACTION, BILATERAL Bilateral    CHOLECYSTECTOMY N/A 01/31/2017   Procedure: LAPAROSCOPIC CHOLECYSTECTOMY WITH INTRAOPERATIVE CHOLANGIOGRAM;  Surgeon: Armandina Gemma, MD;  Location: [facility];  Service: General;  Laterality: N/A;   INCISION AND DRAINAGE PERIRECTAL ABSCESS N/A 12/02/2017   Procedure:  IRRIGATION AND DEBRIDEMENT PERINEAL ABSCESS;  Surgeon: Greer Pickerel, MD;  Location: [facility];  Service: General;  Laterality: N/A;   MENISCUS REPAIR Right 2013   ROTATOR CUFF REPAIR Right 2005   TONSILLECTOMY     as a child   TOTAL ABDOMINAL HYSTERECTOMY  1988   TOTAL KNEE ARTHROPLASTY Left    WRIST RECONSTRUCTION Right 2008     Past OB/GYN History: OB History  Gravida Para Term Preterm AB Living  4       1 2   SAB IAB Ectopic Multiple Live Births  1       2    # Outcome Date GA Lbr Len/2nd Weight Sex Delivery Anes PTL Lv  4 Gravida           3 Gravida           2 Gravida           1 SAB             Vaginal deliveries: 2,  Forceps: 1, Cesarean section: 0 S/p  hysterectomy   Medications: She has a current medication list which includes the following prescription(s): acetaminophen, amlodipine, amoxicillin-clavulanate, ascorbic acid, aspirin ec, cannabidiol, vitamin d-3, famotidine, fexofenadine, hydrochlorothiazide, renflexis, losartan, magnesium, melatonin, melatonin, meloxicam, metoprolol succinate, mirabegron er, multivitamin, fish oil, omeprazole, polycarbophil, polyethylene glycol, rosuvastatin, and valacyclovir.   Allergies: Patient is allergic to methotrexate, demerol [meperidine], nickel, and tape.   Social History:  Social History   Tobacco Use   Smoking status: Never   Smokeless tobacco: Never  Vaping Use   Vaping Use: Never used  Substance Use Topics   Alcohol use: Yes    Alcohol/week: 7.0 standard drinks    Types: 7 Standard drinks or equivalent per week    Comment: wine daily   Drug use: No    Relationship status: married She lives with spouse.   She is not employed. Regular exercise: No History of abuse: No  Family History:   Family History  Problem Relation Age of Onset   Colon polyps Mother    Diabetes Mother    Heart disease Mother    Kidney cancer Mother    Arthritis Mother    Hypertension Mother    Hyperlipidemia Mother    Colon polyps Son    CAD Brother    Esophageal cancer Brother    Dementia Father    Colon cancer Neg Hx    Stomach cancer Neg Hx    Rectal cancer Neg Hx    Prostate cancer Neg Hx      Review of Systems: Review of Systems  Constitutional:  Positive for malaise/fatigue. Negative for fever and weight loss.  Respiratory:  Positive for cough. Negative for shortness of breath and wheezing.   Cardiovascular:  Positive for leg swelling. Negative for chest pain and palpitations.  Gastrointestinal:  Negative for abdominal pain and blood in stool.  Genitourinary:  Negative for dysuria.  Musculoskeletal:  Negative for myalgias.  Skin:  Negative for rash.  Neurological:  Negative for  dizziness and headaches.  Endo/Heme/Allergies:  Bruises/bleeds easily.  Psychiatric/Behavioral:  Negative for depression. The patient is not nervous/anxious.     OBJECTIVE Physical Exam: Vitals:   12/13/21 1425  BP: 127/74  Pulse: 86  Weight: 160 lb (72.6 kg)  Height: 5' 2.5" (1.588 m)    Physical Exam Constitutional:      General: She is not in acute distress. Pulmonary:     Effort: Pulmonary effort is normal.  Abdominal:  General: There is no distension.     Palpations: Abdomen is soft.     Tenderness: There is no abdominal tenderness. There is no rebound.  Musculoskeletal:        General: No swelling. Normal range of motion.  Skin:    General: Skin is warm and dry.     Findings: No rash.  Neurological:     Mental Status: She is alert and oriented to person, place, and time.  Psychiatric:        Mood and Affect: Mood normal.        Behavior: Behavior normal.     GU / Detailed Urogynecologic Evaluation:  Pelvic Exam: Normal external female genitalia; Bartholin's and Skene's glands normal in appearance; urethral meatus normal in appearance, no urethral masses or discharge.   CST: positive (with valsalva)  s/p hysterectomy: Speculum exam reveals normal vaginal mucosa with  atrophy and normal vaginal cuff.  Adnexa no mass, fullness, tenderness.    Pelvic floor strength II/V  Pelvic floor musculature: Right levator non-tender, Right obturator non-tender, Left levator non-tender, Left obturator non-tender  POP-Q:   POP-Q  -3                                            Aa   -3                                           Ba  -5.5                                              C   3.5                                            Gh  5                                            Pb  7                                            tvl   0                                            Ap  0                                            Bp  D     Rectal Exam:  Normal external rectum  Post-Void Residual (PVR) by Bladder Scan: In order to evaluate bladder emptying, we discussed obtaining a postvoid residual and she agreed to this procedure.  Procedure: The ultrasound unit was placed on the patient's abdomen in the suprapubic region after the patient had voided. A PVR of 5 ml was obtained by bladder scan.  Laboratory Results: POC urine: trace blood, otherwise negative   ASSESSMENT AND PLAN Courtney Crane is a 71 y.o. with:  1. Overactive bladder   2. Urinary frequency   3. SUI (stress urinary incontinence, female)   4. Prolapse of posterior vaginal wall    OAB - We discussed the symptoms of overactive bladder (OAB), which include urinary urgency, urinary frequency, nocturia, with or without urge incontinence.  While we do not know the exact etiology of OAB, several treatment options exist. We discussed management including behavioral therapy (decreasing bladder irritants, urge suppression strategies, timed voids, bladder retraining), physical therapy, medication; for refractory cases posterior tibial nerve stimulation, sacral neuromodulation, and intravesical botulinum toxin injection.  - Prescribed Myrbetriq 25mg  daily. For Beta-3 agonist medication, we discussed the potential side effect of elevated blood pressure which is more likely to occur in individuals with uncontrolled hypertension.   2. SUI -For treatment of stress urinary incontinence,  non-surgical options include expectant management, weight loss, physical therapy, as well as a pessary.  Surgical options include a midurethral sling, and transurethral injection of a bulking agent. - Has prior history of a sling. She maybe is intertested in urethral bulking but would like to try another pessary first.   3. Stage I anterior, Stage II posterior, Stage I apical prolapse - She is asymptomatic for her prolapse, will expectantly manage   Return for  pessary fitting for SUI  Jaquita Folds, MD

## 2021-12-21 ENCOUNTER — Other Ambulatory Visit: Payer: Self-pay

## 2021-12-21 ENCOUNTER — Ambulatory Visit: Payer: PPO | Admitting: Pulmonary Disease

## 2021-12-21 ENCOUNTER — Encounter: Payer: Self-pay | Admitting: Pulmonary Disease

## 2021-12-21 VITALS — BP 120/60 | HR 89 | Temp 98.5°F | Ht 62.0 in | Wt 156.7 lb

## 2021-12-21 DIAGNOSIS — R0602 Shortness of breath: Secondary | ICD-10-CM | POA: Diagnosis not present

## 2021-12-21 DIAGNOSIS — R0609 Other forms of dyspnea: Secondary | ICD-10-CM

## 2021-12-21 DIAGNOSIS — M05742 Rheumatoid arthritis with rheumatoid factor of left hand without organ or systems involvement: Secondary | ICD-10-CM

## 2021-12-21 DIAGNOSIS — M05741 Rheumatoid arthritis with rheumatoid factor of right hand without organ or systems involvement: Secondary | ICD-10-CM | POA: Diagnosis not present

## 2021-12-21 MED ORDER — ALBUTEROL SULFATE HFA 108 (90 BASE) MCG/ACT IN AERS: 2.0000 | INHALATION_SPRAY | Freq: Four times a day (QID) | RESPIRATORY_TRACT | 6 refills | Status: DC | PRN

## 2021-12-21 NOTE — Patient Instructions (Signed)
Thank you for visiting Dr. Valeta Harms at [facility]. Today we recommend the following:  Orders Placed This Encounter  Procedures   CT CHEST HIGH RESOLUTION   Pulmonary Function Test   Return in about 3 months (around 03/20/2022) for with APP or Dr. Valeta Harms.    Please do your part to reduce the spread of COVID-19.

## 2021-12-21 NOTE — Progress Notes (Signed)
Synopsis: Referred in Feb 2023 for chest pains  by Alfredia Ferguson, PA-C  Subjective:   PATIENT ID: Courtney Crane GENDER: female DOB: 22-Sep-1951, MRN: [id number]  Chief Complaint  Patient presents with   Follow-up    Patient is here to talk about shortness of breath    PMH HTN, HLD, IBS, diverticulitis, Rheumatoid Arthritis, OA. She on infliximab q6 weeks. On meloxicam. New dx of RA as of 2018.  Patient had a recent ER visit for evaluation.  CTA of the chest completed which was negative for PE.  From respiratory standpoint she still has shortness of breath with exertion.  She occasionally has flareups of her RA still on infliximab.  Currently using meloxicam for pain control.  We reviewed her CT findings today in the office.  Her shortness of breath is predominantly associated with exertion.  She also noticed when it is hot and humid outside occasionally she feels short of breath.   Past Medical History:  Diagnosis Date   Anemia    Anxiety    Arthritis    Asthma    Cataract    Colon polyps    Complication of anesthesia    "sometimes I dont like to wake up after " ie difficult to awaken    Diverticulitis    Diverticulosis    Fatty liver    GERD (gastroesophageal reflux disease)    HLD (hyperlipidemia)    HTN (hypertension)    IBS (irritable bowel syndrome)    Insomnia    MRSA infection    Osteoarthritis    Osteopenia    PONV (postoperative nausea and vomiting)    RA (rheumatoid arthritis) (HCC)    Rheumatoid arthritis (HCC)    Seasonal allergies    Shingles    Status post dilation of esophageal narrowing    Vitamin D deficiency      Family History  Problem Relation Age of Onset   Colon polyps Mother    Diabetes Mother    Heart disease Mother    Kidney cancer Mother    Arthritis Mother    Hypertension Mother    Hyperlipidemia Mother    Colon polyps Son    CAD Brother    Esophageal cancer Brother    Dementia Father    Colon cancer Neg Hx    Stomach  cancer Neg Hx    Rectal cancer Neg Hx    Prostate cancer Neg Hx      Past Surgical History:  Procedure Laterality Date   APPENDECTOMY  1975   bladder tack  1990   CATARACT EXTRACTION, BILATERAL Bilateral    CHOLECYSTECTOMY N/A 01/31/2017   Procedure: LAPAROSCOPIC CHOLECYSTECTOMY WITH INTRAOPERATIVE CHOLANGIOGRAM;  Surgeon: Armandina Gemma, MD;  Location: [facility];  Service: General;  Laterality: N/A;   INCISION AND DRAINAGE PERIRECTAL ABSCESS N/A 12/02/2017   Procedure: IRRIGATION AND DEBRIDEMENT PERINEAL ABSCESS;  Surgeon: Greer Pickerel, MD;  Location: [facility];  Service: General;  Laterality: N/A;   MENISCUS REPAIR Right 2013   ROTATOR CUFF REPAIR Right 2005   TONSILLECTOMY     as a child   TOTAL ABDOMINAL HYSTERECTOMY  1988   TOTAL KNEE ARTHROPLASTY Left    WRIST RECONSTRUCTION Right 2008    Social History   Socioeconomic History   Marital status: Married    Spouse name: Not on file   Number of children: 2   Years of education: Not on file   Highest education level: Not on file  Occupational History   Occupation:  retired  Tobacco Use   Smoking status: Never   Smokeless tobacco: Never  Vaping Use   Vaping Use: Never used  Substance and Sexual Activity   Alcohol use: Yes    Alcohol/week: 7.0 standard drinks    Types: 7 Standard drinks or equivalent per week    Comment: wine daily   Drug use: No   Sexual activity: Yes    Birth control/protection: None  Other Topics Concern   Not on file  Social History Narrative   Retired from [employer]   Lives at home with husband ; one level home   Right handed   Highest level of education:  Some college   Social Determinants of Health   Financial Resource Strain: Not on file  Food Insecurity: Not on file  Transportation Needs: Not on file  Physical Activity: Not on file  Stress: Not on file  Social Connections: Not on file  Intimate Partner Violence: Not on file     Allergies  Allergen Reactions   Methotrexate Other (See  Comments)   Demerol [Meperidine] Nausea Only   Nickel Rash   Tape Rash and Other (See Comments)     Outpatient Medications Prior to Visit  Medication Sig Dispense Refill   acetaminophen (TYLENOL) 500 MG tablet Take 1,000 mg by mouth every 6 (six) hours as needed for moderate pain.     amLODipine (NORVASC) 10 MG tablet Take 10 mg by mouth daily.      ascorbic acid (VITAMIN C) 1000 MG tablet Take 1,000 mg by mouth daily.     aspirin EC 81 MG tablet Take 81 mg by mouth daily. Swallow whole.     CANNABIDIOL PO Take 2 capsules by mouth at bedtime.     Cholecalciferol (VITAMIN D-3) 125 MCG (5000 UT) TABS Take 1 tablet by mouth daily.     famotidine (PEPCID) 40 MG tablet Take 1 tablet (40 mg total) by mouth at bedtime. 90 tablet 3   fexofenadine (ALLEGRA) 180 MG tablet Take 180 mg by mouth daily as needed for allergies.     hydrochlorothiazide (HYDRODIURIL) 25 MG tablet Take 25 mg by mouth daily.     inFLIXimab-abda (RENFLEXIS) 100 MG SOLR Inject 1 each into the vein every 8 (eight) weeks.     losartan (COZAAR) 100 MG tablet Take 100 mg by mouth daily.     Magnesium 250 MG TABS Take by mouth.     melatonin 1 MG TABS tablet Take 1 mg by mouth as needed.     MELATONIN PO Take 1.5 mg by mouth at bedtime as needed (sleep).     meloxicam (MOBIC) 15 MG tablet Take 15 mg by mouth daily.     metoprolol succinate (TOPROL-XL) 25 MG 24 hr tablet Take 0.5 tablets (12.5 mg total) by mouth daily. Take with or immediately following a meal. 15 tablet 3   mirabegron ER (MYRBETRIQ) 25 MG TB24 tablet Take 1 tablet (25 mg total) by mouth daily. 30 tablet 5   Multiple Vitamin (MULTIVITAMIN) tablet Take 1 tablet by mouth daily.     Omega-3 Fatty Acids (FISH OIL) 1200 MG CAPS Take 1,200 mg by mouth 2 (two) times daily.     omeprazole (PRILOSEC) 40 MG capsule TAKE 1 CAPSULE BY MOUTH DAILY BEFORE BREAKFAST. TAKE ONE PILL 20-30 MINUTES BEFORE BREAKFAST MEAL. 90 capsule 3   polycarbophil (FIBERCON) 625 MG tablet Take 625  mg by mouth daily.     polyethylene glycol (MIRALAX / GLYCOLAX) 17 g packet Take  17 g by mouth every Monday, Wednesday, and Friday.     rosuvastatin (CRESTOR) 5 MG tablet Take 5 mg by mouth daily.     valACYclovir (VALTREX) 500 MG tablet Take 500 mg by mouth daily as needed.     No facility-administered medications prior to visit.    Review of Systems  Constitutional:  Negative for chills, fever, malaise/fatigue and weight loss.  HENT:  Negative for hearing loss, sore throat and tinnitus.   Eyes:  Negative for blurred vision and double vision.  Respiratory:  Positive for shortness of breath. Negative for cough, hemoptysis, sputum production, wheezing and stridor.   Cardiovascular:  Negative for chest pain, palpitations, orthopnea, leg swelling and PND.  Gastrointestinal:  Negative for abdominal pain, constipation, diarrhea, heartburn, nausea and vomiting.  Genitourinary:  Negative for dysuria, hematuria and urgency.  Musculoskeletal:  Negative for joint pain and myalgias.  Skin:  Negative for itching and rash.  Neurological:  Negative for dizziness, tingling, weakness and headaches.  Endo/Heme/Allergies:  Negative for environmental allergies. Does not bruise/bleed easily.  Psychiatric/Behavioral:  Negative for depression. The patient is not nervous/anxious and does not have insomnia.   All other systems reviewed and are negative.   Objective:  Physical Exam Vitals reviewed.  Constitutional:      General: She is not in acute distress.    Appearance: She is well-developed.  HENT:     Head: Normocephalic and atraumatic.  Eyes:     General: No scleral icterus.    Conjunctiva/sclera: Conjunctivae normal.     Pupils: Pupils are equal, round, and reactive to light.  Neck:     Vascular: No JVD.     Trachea: No tracheal deviation.  Cardiovascular:     Rate and Rhythm: Normal rate and regular rhythm.     Heart sounds: Normal heart sounds. No murmur heard. Pulmonary:     Effort:  Pulmonary effort is normal. No tachypnea, accessory muscle usage or respiratory distress.     Breath sounds: No stridor. No wheezing, rhonchi or rales.  Abdominal:     General: There is no distension.     Palpations: Abdomen is soft.     Tenderness: There is no abdominal tenderness.  Musculoskeletal:        General: No tenderness.     Cervical back: Neck supple.  Lymphadenopathy:     Cervical: No cervical adenopathy.  Skin:    General: Skin is warm and dry.     Capillary Refill: Capillary refill takes less than 2 seconds.     Findings: No rash.  Neurological:     Mental Status: She is alert and oriented to person, place, and time.  Psychiatric:        Behavior: Behavior normal.     Vitals:   12/21/21 1436  BP: 120/60  Pulse: 89  Temp: 98.5 F (36.9 C)  TempSrc: Oral  SpO2: 99%  Weight: 156 lb 11.2 oz (71.1 kg)  Height: 5\' 2"  (1.575 m)   99% on  LPM  RA BMI Readings from Last 3 Encounters:  12/21/21 28.66 kg/m  12/13/21 28.80 kg/m  11/27/21 29.26 kg/m   Wt Readings from Last 3 Encounters:  12/21/21 156 lb 11.2 oz (71.1 kg)  12/13/21 160 lb (72.6 kg)  11/27/21 160 lb (72.6 kg)     CBC    Component Value Date/Time   WBC 6.2 11/27/2021 1124   RBC 4.21 11/27/2021 1124   HGB 12.9 11/27/2021 1124   HCT 39.2 11/27/2021 1124  PLT 182.0 11/27/2021 1124   MCV 93.0 11/27/2021 1124   MCH 31.0 08/31/2021 1352   MCHC 32.9 11/27/2021 1124   RDW 15.2 11/27/2021 1124   LYMPHSABS 2.6 11/27/2021 1124   MONOABS 0.8 11/27/2021 1124   EOSABS 0.3 11/27/2021 1124   BASOSABS 0.1 11/27/2021 1124      Chest Imaging: CT chest October 2022: No PE, there is some subtle parenchymal changes, possibly related to atelectasis but there is an obvious difference there on her CT imaging. The patient's images have been independently reviewed by me.    Pulmonary Functions Testing Results: No flowsheet data found.  FeNO:   Pathology:   Echocardiogram:   Heart  Catheterization:     Assessment & Plan:     ICD-10-CM   1. Rheumatoid arthritis involving both hands with positive rheumatoid factor (HCC)  M05.741 CT CHEST HIGH RESOLUTION   M05.742 Pulmonary Function Test    2. SOB (shortness of breath)  R06.02 CT CHEST HIGH RESOLUTION    Pulmonary Function Test    3. DOE (dyspnea on exertion)  R06.09       Discussion:  This is a 71 year old female, on infliximab infusions for rheumatoid arthritis.  She has some subtle parenchymal changes on her recent CTA of the chest while in the emergency room.  No obvious reason for her shortness of breath but with her RA history I do think we need to investigate further  Plan: We will start with pulmonary function test. It is also been several months since her last CT imaging.  I think she needs a high-resolution CT scan of the chest. We will see her back in the office once pulmonary function test complete and HRCT. If in fact she has any changes consistent with interstitial lung disease we will get her to establish care in our [facility] clinic. We will give her a albuterol inhaler to see if this makes any difference in her symptoms as a trial basis. RTC in approximately 8 weeks after tests have been completed.   Current Outpatient Medications:    acetaminophen (TYLENOL) 500 MG tablet, Take 1,000 mg by mouth every 6 (six) hours as needed for moderate pain., Disp: , Rfl:    albuterol (VENTOLIN HFA) 108 (90 Base) MCG/ACT inhaler, Inhale 2 puffs into the lungs every 6 (six) hours as needed for wheezing or shortness of breath., Disp: 8 g, Rfl: 6   amLODipine (NORVASC) 10 MG tablet, Take 10 mg by mouth daily. , Disp: , Rfl:    ascorbic acid (VITAMIN C) 1000 MG tablet, Take 1,000 mg by mouth daily., Disp: , Rfl:    aspirin EC 81 MG tablet, Take 81 mg by mouth daily. Swallow whole., Disp: , Rfl:    CANNABIDIOL PO, Take 2 capsules by mouth at bedtime., Disp: , Rfl:    Cholecalciferol (VITAMIN D-3) 125 MCG (5000 UT) TABS,  Take 1 tablet by mouth daily., Disp: , Rfl:    famotidine (PEPCID) 40 MG tablet, Take 1 tablet (40 mg total) by mouth at bedtime., Disp: 90 tablet, Rfl: 3   fexofenadine (ALLEGRA) 180 MG tablet, Take 180 mg by mouth daily as needed for allergies., Disp: , Rfl:    hydrochlorothiazide (HYDRODIURIL) 25 MG tablet, Take 25 mg by mouth daily., Disp: , Rfl:    inFLIXimab-abda (RENFLEXIS) 100 MG SOLR, Inject 1 each into the vein every 8 (eight) weeks., Disp: , Rfl:    losartan (COZAAR) 100 MG tablet, Take 100 mg by mouth daily., Disp: , Rfl:  Magnesium 250 MG TABS, Take by mouth., Disp: , Rfl:    melatonin 1 MG TABS tablet, Take 1 mg by mouth as needed., Disp: , Rfl:    MELATONIN PO, Take 1.5 mg by mouth at bedtime as needed (sleep)., Disp: , Rfl:    meloxicam (MOBIC) 15 MG tablet, Take 15 mg by mouth daily., Disp: , Rfl:    metoprolol succinate (TOPROL-XL) 25 MG 24 hr tablet, Take 0.5 tablets (12.5 mg total) by mouth daily. Take with or immediately following a meal., Disp: 15 tablet, Rfl: 3   mirabegron ER (MYRBETRIQ) 25 MG TB24 tablet, Take 1 tablet (25 mg total) by mouth daily., Disp: 30 tablet, Rfl: 5   Multiple Vitamin (MULTIVITAMIN) tablet, Take 1 tablet by mouth daily., Disp: , Rfl:    Omega-3 Fatty Acids (FISH OIL) 1200 MG CAPS, Take 1,200 mg by mouth 2 (two) times daily., Disp: , Rfl:    omeprazole (PRILOSEC) 40 MG capsule, TAKE 1 CAPSULE BY MOUTH DAILY BEFORE BREAKFAST. TAKE ONE PILL 20-30 MINUTES BEFORE BREAKFAST MEAL., Disp: 90 capsule, Rfl: 3   polycarbophil (FIBERCON) 625 MG tablet, Take 625 mg by mouth daily., Disp: , Rfl:    polyethylene glycol (MIRALAX / GLYCOLAX) 17 g packet, Take 17 g by mouth every Monday, Wednesday, and Friday., Disp: , Rfl:    rosuvastatin (CRESTOR) 5 MG tablet, Take 5 mg by mouth daily., Disp: , Rfl:    valACYclovir (VALTREX) 500 MG tablet, Take 500 mg by mouth daily as needed., Disp: , Rfl:    Garner Nash, DO [facility] 12/21/2021 3:32  PM

## 2021-12-25 ENCOUNTER — Telehealth: Payer: Self-pay | Admitting: Physician Assistant

## 2021-12-25 NOTE — Telephone Encounter (Signed)
Inbound call from patient stating she is having issues with hemorrhoids.  Has an appt scheduled for 12/27/2021 but insists on speaking with nurse prior.  Please advise.

## 2021-12-25 NOTE — Telephone Encounter (Signed)
Pt has an appt Thursday with the doctor and states she has been using prep h and doing stiz bathes. She reports she was doing the sitz bathes with epsom salts and her skin has been drying out. Discussed with pt that she can just do warm water for the sitz bathes and she can also try recticare for the discomfort. Pt verbalized understanding.

## 2021-12-27 ENCOUNTER — Other Ambulatory Visit: Payer: Self-pay | Admitting: Gastroenterology

## 2021-12-27 ENCOUNTER — Encounter: Payer: Self-pay | Admitting: Gastroenterology

## 2021-12-27 ENCOUNTER — Ambulatory Visit: Payer: PPO | Admitting: Gastroenterology

## 2021-12-27 VITALS — BP 142/62 | HR 70 | Ht 62.0 in | Wt 159.0 lb

## 2021-12-27 DIAGNOSIS — K649 Unspecified hemorrhoids: Secondary | ICD-10-CM | POA: Diagnosis not present

## 2021-12-27 MED ORDER — HYDROCORT-PRAMOXINE (PERIANAL) 2.5-1 % EX CREA: 1.0000 "application " | TOPICAL_CREAM | Freq: Three times a day (TID) | CUTANEOUS | 0 refills | Status: DC

## 2021-12-27 NOTE — Progress Notes (Signed)
Review of pertinent gastrointestinal problems: 1. Recurrent acute diverticulitis: established care Dr. Ardis Hughs 2017; previously at least 3 attacks of diverticulitis.  The pain is usually LLQ, lasting 5-10 days, cipro/flagyl, has had CT proof in past.  Most recent attack 2015 while moving to [place].  Put on antibiotics and her symptoms resolved. Usually gets 'low grade fevers.'  She has had 4-5 attacks of pain.  Never hospitalized. 2. Routine risk for colon cancer: Colonoscopy 01/2016 Dr. Ardis Hughs: pan-diverticulosis, no polyps. Recall at 10 years recommended.  Hemoccult positive stool testing led to repeat colonoscopy November 2021 that showed left-sided diverticulosis and internal hemorrhoids which were small. 3. Symptomatic gallstones: s/p lap chole 01/2017, normal IOC 4.  Mild gastritis, EGD March 2021 showed mild gastritis, biopsies showed no sign of infection or cancer.  Once daily omeprazole helped her symptoms, she was greatly improved.     HPI: This is a very pleasant 71 year old woman   She was here in our office about 1 month ago when she saw Amy.  She was bothered by some exertional chest discomfort shortness of breath.  Recent cardiology testing was negative, Amy recommended she be seen by pulmonologist who is planning to do PFTs as well as repeat chest CT.  She also had some mild tenderness along the left costal margins.  Amy arrange CT scan abdomen pelvis and this showed no clear GI etiology  Blood work last month showed normal CBC, normal basic metabolic profile.  She has had difficulty with what she believes is a hemorrhoid for the past 2 weeks.  It is itching and painful, she sees a small amount of blood at times.  She has tried sitz bath's, Preparation H over-the-counter creams and some RectiCare.  None of this has been helpful.  She has had no preceding constipations or significant changes in her bowels.  She was sitting quite a bit because of his basketball season she has 5 grandchildren  who play basketball.  She has been on bleachers quite a lot.   ROS: complete GI ROS as described in HPI, all other review negative.  Constitutional:  No unintentional weight loss   Past Medical History:  Diagnosis Date   Anemia    Anxiety    Arthritis    Asthma    Cataract    Colon polyps    Complication of anesthesia    "sometimes I dont like to wake up after " ie difficult to awaken    Diverticulitis    Diverticulosis    Fatty liver    GERD (gastroesophageal reflux disease)    HLD (hyperlipidemia)    HTN (hypertension)    IBS (irritable bowel syndrome)    Insomnia    MRSA infection    Osteoarthritis    Osteopenia    PONV (postoperative nausea and vomiting)    RA (rheumatoid arthritis) (HCC)    Rheumatoid arthritis (HCC)    Seasonal allergies    Shingles    Status post dilation of esophageal narrowing    Vitamin D deficiency     Past Surgical History:  Procedure Laterality Date   APPENDECTOMY  1975   bladder tack  1990   CATARACT EXTRACTION, BILATERAL Bilateral    CHOLECYSTECTOMY N/A 01/31/2017   Procedure: LAPAROSCOPIC CHOLECYSTECTOMY WITH INTRAOPERATIVE CHOLANGIOGRAM;  Surgeon: Armandina Gemma, MD;  Location: [facility];  Service: General;  Laterality: N/A;   INCISION AND DRAINAGE PERIRECTAL ABSCESS N/A 12/02/2017   Procedure: IRRIGATION AND DEBRIDEMENT PERINEAL ABSCESS;  Surgeon: Greer Pickerel, MD;  Location: [facility];  Service: General;  Laterality: N/A;   MENISCUS REPAIR Right 2013   ROTATOR CUFF REPAIR Right 2005   TONSILLECTOMY     as a child   TOTAL ABDOMINAL HYSTERECTOMY  1988   TOTAL KNEE ARTHROPLASTY Left    WRIST RECONSTRUCTION Right 2008    Current Outpatient Medications  Medication Sig Dispense Refill   acetaminophen (TYLENOL) 500 MG tablet Take 1,000 mg by mouth every 6 (six) hours as needed for moderate pain.     albuterol (VENTOLIN HFA) 108 (90 Base) MCG/ACT inhaler Inhale 2 puffs into the lungs every 6 (six) hours as needed for wheezing or shortness of  breath. 8 g 6   amLODipine (NORVASC) 10 MG tablet Take 10 mg by mouth daily.      ascorbic acid (VITAMIN C) 1000 MG tablet Take 1,000 mg by mouth daily.     aspirin EC 81 MG tablet Take 81 mg by mouth daily. Swallow whole.     CANNABIDIOL PO Take 2 capsules by mouth at bedtime.     Cholecalciferol (VITAMIN D-3) 125 MCG (5000 UT) TABS Take 1 tablet by mouth daily.     famotidine (PEPCID) 40 MG tablet Take 1 tablet (40 mg total) by mouth at bedtime. 90 tablet 3   fexofenadine (ALLEGRA) 180 MG tablet Take 180 mg by mouth daily as needed for allergies.     hydrochlorothiazide (HYDRODIURIL) 25 MG tablet Take 25 mg by mouth daily.     inFLIXimab-abda (RENFLEXIS) 100 MG SOLR Inject 1 each into the vein every 8 (eight) weeks.     losartan (COZAAR) 100 MG tablet Take 100 mg by mouth daily.     Magnesium 250 MG TABS Take by mouth.     melatonin 1 MG TABS tablet Take 1 mg by mouth as needed.     MELATONIN PO Take 1.5 mg by mouth at bedtime as needed (sleep).     meloxicam (MOBIC) 15 MG tablet Take 15 mg by mouth daily.     metoprolol succinate (TOPROL-XL) 25 MG 24 hr tablet Take 0.5 tablets (12.5 mg total) by mouth daily. Take with or immediately following a meal. 15 tablet 3   mirabegron ER (MYRBETRIQ) 25 MG TB24 tablet Take 1 tablet (25 mg total) by mouth daily. 30 tablet 5   Multiple Vitamin (MULTIVITAMIN) tablet Take 1 tablet by mouth daily.     Omega-3 Fatty Acids (FISH OIL) 1200 MG CAPS Take 1,200 mg by mouth 2 (two) times daily.     omeprazole (PRILOSEC) 40 MG capsule TAKE 1 CAPSULE BY MOUTH DAILY BEFORE BREAKFAST. TAKE ONE PILL 20-30 MINUTES BEFORE BREAKFAST MEAL. 90 capsule 3   polycarbophil (FIBERCON) 625 MG tablet Take 625 mg by mouth daily.     polyethylene glycol (MIRALAX / GLYCOLAX) 17 g packet Take 17 g by mouth every Monday, Wednesday, and Friday.     rosuvastatin (CRESTOR) 5 MG tablet Take 5 mg by mouth daily.     valACYclovir (VALTREX) 500 MG tablet Take 500 mg by mouth daily as needed.      No current facility-administered medications for this visit.    Allergies as of 12/27/2021 - Review Complete 12/27/2021  Allergen Reaction Noted   Methotrexate Other (See Comments) 10/18/2021   Demerol [meperidine] Nausea Only 01/16/2016   Nickel Rash 01/11/2018   Tape Rash and Other (See Comments) 10/18/2021    Family History  Problem Relation Age of Onset   Colon polyps Mother    Diabetes Mother  Heart disease Mother    Kidney cancer Mother    Arthritis Mother    Hypertension Mother    Hyperlipidemia Mother    Colon polyps Son    CAD Brother    Esophageal cancer Brother    Dementia Father    Colon cancer Neg Hx    Stomach cancer Neg Hx    Rectal cancer Neg Hx    Prostate cancer Neg Hx     Social History   Socioeconomic History   Marital status: Married    Spouse name: Not on file   Number of children: 2   Years of education: Not on file   Highest education level: Not on file  Occupational History   Occupation: retired  Tobacco Use   Smoking status: Never   Smokeless tobacco: Never  Vaping Use   Vaping Use: Never used  Substance and Sexual Activity   Alcohol use: Yes    Alcohol/week: 7.0 standard drinks    Types: 7 Standard drinks or equivalent per week    Comment: wine daily   Drug use: No   Sexual activity: Yes    Birth control/protection: None  Other Topics Concern   Not on file  Social History Narrative   Retired from [employer]   Lives at home with husband ; one level home   Right handed   Highest level of education:  Some college   Social Determinants of Health   Financial Resource Strain: Not on file  Food Insecurity: Not on file  Transportation Needs: Not on file  Physical Activity: Not on file  Stress: Not on file  Social Connections: Not on file  Intimate Partner Violence: Not on file     Physical Exam: BP (!) 142/62    Pulse 70    Ht 5\' 2"  (1.575 m)    Wt 159 lb (72.1 kg)    BMI 29.08 kg/m  Constitutional: generally  well-appearing Psychiatric: alert and oriented x3 Abdomen: soft, nontender, nondistended, no obvious ascites, no peritoneal signs, normal bowel sounds No peripheral edema noted in lower extremities Rectal examination with female assistant in the room: Obvious semithrombosed external hemorrhoid, with smaller deflated hemorrhoid tissue surrounding her anus.  No obvious anal fissures, digital rectal exam unable to palpate what seems to be swollen internal hemorrhoids as well.  Stool was brown and not checked for Hemoccult status  Assessment and plan: 71 y.o. female with symptomatic external hemorrhoids, 70 thrombosed  She has been sitting a lot on bleachers watching her grandchildren play basketball and that might have contributed to the formation of this.  She never really struggles with bowel movements or constipation.  She has not had any serious large BMs lately.  She will start hemorrhoid care with the following Recticare samples given: apply 2-3 times daily. Sitz baths three times daily Analpram cream (2.5%/1%) called in, apply twice daily Purchase and start using a hemorrhoid donut for the next 1-2 weeks.  If this is getting worse and not better over the next week the we will help expedite surgery evaluation.  Please see the "Patient Instructions" section for addition details about the plan.  Owens Loffler, MD [facility] 12/27/2021, 10:02 AM   Total time on date of encounter was 30 minutes (this included time spent preparing to see the patient reviewing records; obtaining and/or reviewing separately obtained history; performing a medically appropriate exam and/or evaluation; counseling and educating the patient and family if present; ordering medications, tests or procedures if applicable; and documenting clinical  information in the health record).

## 2021-12-27 NOTE — Patient Instructions (Signed)
Please start Recticare three times a day, we have given yo some samples to try.   Do sitz baths three times a day.   Use Analpram three times a day.  Purchase a hemorrhoid donut.  Call in 7 days with an update.  It was a pleasure to see you today!  Thank you for trusting me with your gastrointestinal care!

## 2021-12-29 NOTE — Telephone Encounter (Signed)
Medication resent to pharmacy as requested per patient.  She will use GoodRx coupon to help with cost.

## 2022-01-01 ENCOUNTER — Encounter: Payer: Self-pay | Admitting: Pulmonary Disease

## 2022-01-01 NOTE — Telephone Encounter (Signed)
The ct is scheduled for 01/2022 looks like her PFT was missed up front

## 2022-01-01 NOTE — Telephone Encounter (Signed)
Please advise on CT

## 2022-01-02 ENCOUNTER — Encounter: Payer: Self-pay | Admitting: Gastroenterology

## 2022-01-08 NOTE — Progress Notes (Signed)
[facility]   Subjective:     Chief Complaint: Pessary fitting  History of Present Illness: Courtney Crane is a 71 y.o. female with stress incontinence and OAB who presents today for a pessary fitting.   Currently on Myrbetriq 25mg  for overactive bladder. She thinks it may be working but has not been on it long enough to really notice a difference yet.   Past Medical History: Patient  has a past medical history of Anemia, Anxiety, Arthritis, Asthma, Cataract, Colon polyps, Complication of anesthesia, Diverticulitis, Diverticulosis, Fatty liver, GERD (gastroesophageal reflux disease), HLD (hyperlipidemia), HTN (hypertension), IBS (irritable bowel syndrome), Insomnia, MRSA infection, Osteoarthritis, Osteopenia, PONV (postoperative nausea and vomiting), RA (rheumatoid arthritis) ([place]), Rheumatoid arthritis ([place]), Seasonal allergies, Shingles, Status post dilation of esophageal narrowing, and Vitamin D deficiency.   Past Surgical History: She  has a past surgical history that includes Appendectomy (1975); Total abdominal hysterectomy (1988); Tonsillectomy; bladder tack (1990); Rotator cuff repair (Right, 2005); Wrist reconstruction (Right, 2008); Meniscus repair (Right, 2013); Total knee arthroplasty (Left); Cholecystectomy (N/A, 01/31/2017); Incision and drainage perirectal abscess (N/A, 12/02/2017); and Cataract extraction, bilateral (Bilateral).   Medications: She has a current medication list which includes the following prescription(s): acetaminophen, albuterol, amlodipine, ascorbic acid, aspirin ec, cannabidiol, vitamin d-3, famotidine, fexofenadine, hydrochlorothiazide, hydrocortisone-pramoxine, renflexis, losartan, magnesium, melatonin, melatonin, meloxicam, metoprolol succinate, mirabegron er, multivitamin, fish oil, omeprazole, polycarbophil, polyethylene glycol, rosuvastatin, and valacyclovir.   Allergies: Patient is allergic to methotrexate, demerol [meperidine], nickel,  and tape.   Social History: Patient  reports that she has never smoked. She has never used smokeless tobacco. She reports current alcohol use of about 7.0 standard drinks per week. She reports that she does not use drugs.      Objective:    BP 136/70    Pulse 73    Wt 160 lb (72.6 kg)    BMI 29.26 kg/m  Gen: No apparent distress, A&O x 3. Pelvic Exam: Normal external female genitalia; Bartholin's and Skene's glands normal in appearance; urethral meatus normal in appearance, no urethral masses or discharge.   A #2 incontinence dish was tried. This was too large. A #1 incontinence ring with support was tried and this worked well. She demonstrated placement and removal. But she went to the bathroom and it fell out.   We then tried: #1 gehrung with knob but it was uncomfortable, a #1 marland but this was too large. A size #0 incontinence dish pessary was fitted. It was comfortable, stayed in place with valsalva and was an appropriate size on examination, with one finger fitting between the pessary and the vaginal walls. The patient demonstrated proper removal and replacement. Lot [id number], Exp 01/16/27  POP-Q:    POP-Q   -3                                            Aa   -3                                           Ba   -5.5  C    3.5                                            Gh   5                                            Pb   7                                            tvl    0                                            Ap   0                                            Bp                                                  D      Assessment/Plan:    Assessment: [name] is a 71 y.o. with stress incontinence and OAB and stage II posterior prolapse who presents for a pessary fitting.  Plan: - She was fit with a #0 incontinence dish pessary.  She will remove as needed, at least weekly .  - Continue with Myrbetriq  25mg   Follow-up in 2-3 weeks for a pessary check or sooner as needed.  All questions were answered.    Jaquita Folds, MD

## 2022-01-09 ENCOUNTER — Encounter: Payer: Self-pay | Admitting: Obstetrics and Gynecology

## 2022-01-09 ENCOUNTER — Other Ambulatory Visit: Payer: Self-pay

## 2022-01-09 ENCOUNTER — Ambulatory Visit: Payer: PPO | Admitting: Obstetrics and Gynecology

## 2022-01-09 VITALS — BP 136/70 | HR 73 | Wt 160.0 lb

## 2022-01-09 DIAGNOSIS — N393 Stress incontinence (female) (male): Secondary | ICD-10-CM

## 2022-01-10 DIAGNOSIS — M0579 Rheumatoid arthritis with rheumatoid factor of multiple sites without organ or systems involvement: Secondary | ICD-10-CM | POA: Diagnosis not present

## 2022-01-15 ENCOUNTER — Encounter: Payer: Self-pay | Admitting: Gastroenterology

## 2022-01-15 NOTE — Progress Notes (Signed)
Primary Physician/Referring:  Velna Hatchet, MD  Patient ID: Courtney Crane, female    DOB: [birth year], 71 y.o.   MRN: [id number]  Chief Complaint  Patient presents with   Precordial pain    3 MONTH   HPI:    Courtney Crane  is a 71 y.o. Caucasian female with history of GERD, hypertension, hyperlipidemia, rheumatoid arthritis, IBS, family history of premature CAD.    Patient presents for 20-month follow-up.  Last office visit started Toprol-XL 12.5 mg p.o. once daily, advised advised patient to follow-up with PCP for further evaluation of symptoms as well as suspected to related to underlying GERD.  Patient has been doing well since last office visit.  She has had no recurrence of palpitations and is tolerating Toprol without issue.  Past Medical History:  Diagnosis Date   Anemia    Anxiety    Arthritis    Asthma    Cataract    Colon polyps    Complication of anesthesia    "sometimes I dont like to wake up after " ie difficult to awaken    Diverticulitis    Diverticulosis    Fatty liver    GERD (gastroesophageal reflux disease)    HLD (hyperlipidemia)    HTN (hypertension)    IBS (irritable bowel syndrome)    Insomnia    MRSA infection    Osteoarthritis    Osteopenia    PONV (postoperative nausea and vomiting)    RA (rheumatoid arthritis) (HCC)    Rheumatoid arthritis (HCC)    Seasonal allergies    Shingles    Status post dilation of esophageal narrowing    Vitamin D deficiency    Past Surgical History:  Procedure Laterality Date   APPENDECTOMY  1975   bladder tack  1990   CATARACT EXTRACTION, BILATERAL Bilateral    CHOLECYSTECTOMY N/A 01/31/2017   Procedure: LAPAROSCOPIC CHOLECYSTECTOMY WITH INTRAOPERATIVE CHOLANGIOGRAM;  Surgeon: Armandina Gemma, MD;  Location: [facility];  Service: General;  Laterality: N/A;   INCISION AND DRAINAGE PERIRECTAL ABSCESS N/A 12/02/2017   Procedure: IRRIGATION AND DEBRIDEMENT PERINEAL ABSCESS;  Surgeon: Greer Pickerel, MD;  Location: [facility]  OR;  Service: General;  Laterality: N/A;   MENISCUS REPAIR Right 2013   ROTATOR CUFF REPAIR Right 2005   TONSILLECTOMY     as a child   TOTAL ABDOMINAL HYSTERECTOMY  1988   TOTAL KNEE ARTHROPLASTY Left    WRIST RECONSTRUCTION Right 2008   Family History  Problem Relation Age of Onset   Colon polyps Mother    Diabetes Mother    Heart disease Mother    Kidney cancer Mother    Arthritis Mother    Hypertension Mother    Hyperlipidemia Mother    Colon polyps Son    CAD Brother    Esophageal cancer Brother    Dementia Father    Colon cancer Neg Hx    Stomach cancer Neg Hx    Rectal cancer Neg Hx    Prostate cancer Neg Hx     Social History   Tobacco Use   Smoking status: Never   Smokeless tobacco: Never  Substance Use Topics   Alcohol use: Yes    Alcohol/week: 7.0 standard drinks    Types: 7 Standard drinks or equivalent per week    Comment: wine daily   Marital Status: Married   ROS  Review of Systems  Constitutional: Negative for malaise/fatigue.  Cardiovascular:  Negative for chest pain, leg swelling, near-syncope, palpitations and syncope.  Respiratory:  Negative for shortness of breath.   Neurological:  Negative for dizziness.   Objective  Blood pressure 137/67, pulse 73, temperature 98 F (36.7 C), temperature source Temporal, resp. rate 17, height 5\' 2"  (1.575 m), weight 160 lb 6.4 oz (72.8 kg), SpO2 98 %.  Vitals with BMI 01/17/2022 01/09/2022 12/27/2021  Height 5\' 2"  - 5\' 2"   Weight 160 lbs 6 oz 160 lbs 159 lbs  BMI 29.33 14.48 18.56  Systolic 314 970 263  Diastolic 67 70 62  Pulse 73 73 70     Physical Exam Vitals reviewed.  HENT:     Head: Normocephalic and atraumatic.  Cardiovascular:     Rate and Rhythm: Normal rate and regular rhythm.     Pulses: Intact distal pulses.     Heart sounds: S1 normal and S2 normal. No murmur heard.   No gallop.  Pulmonary:     Effort: Pulmonary effort is normal. No respiratory distress.     Breath sounds: No  wheezing, rhonchi or rales.  Musculoskeletal:     Right lower leg: No edema.     Left lower leg: No edema.  Neurological:     Mental Status: She is alert.  Physical exam unchanged from previous office visit.  Laboratory examination:   Recent Labs    01/19/21 1400 08/31/21 1352 11/27/21 1124  NA 136 136 136  K 3.3* 3.5 4.1  CL 98 101 100  CO2 24 23 28   GLUCOSE 89 100* 98  BUN 20 16 23   CREATININE 0.85 1.01* 0.90  CALCIUM 9.6 9.5 9.7  GFRNONAA >60 60*  --    CrCl cannot be calculated (Patient's most recent lab result is older than the maximum 21 days allowed.).  CMP Latest Ref Rng & Units 11/27/2021 08/31/2021 01/19/2021  Glucose 70 - 99 mg/dL 98 100(H) 89  BUN 6 - 23 mg/dL 23 16 20   Creatinine 0.40 - 1.20 mg/dL 0.90 1.01(H) 0.85  Sodium 135 - 145 mEq/L 136 136 136  Potassium 3.5 - 5.1 mEq/L 4.1 3.5 3.3(L)  Chloride 96 - 112 mEq/L 100 101 98  CO2 19 - 32 mEq/L 28 23 24   Calcium 8.4 - 10.5 mg/dL 9.7 9.5 9.6  Total Protein 6.5 - 8.1 g/dL - 7.2 8.5(H)  Total Bilirubin 0.3 - 1.2 mg/dL - 0.6 0.8  Alkaline Phos 38 - 126 U/L - 49 48  AST 15 - 41 U/L - 42(H) 32  ALT 0 - 44 U/L - 41 38   CBC Latest Ref Rng & Units 11/27/2021 08/31/2021 01/19/2021  WBC 4.0 - 10.5 K/uL 6.2 9.6 8.1  Hemoglobin 12.0 - 15.0 g/dL 12.9 13.1 14.7  Hematocrit 36.0 - 46.0 % 39.2 40.1 42.2  Platelets 150.0 - 400.0 K/uL 182.0 194 184    Lipid Panel No results for input(s): CHOL, TRIG, LDLCALC, VLDL, HDL, CHOLHDL, LDLDIRECT in the last 8760 hours.  HEMOGLOBIN A1C No results found for: HGBA1C, MPG TSH No results for input(s): TSH in the last 8760 hours.  External labs:   05/09/2021: BUN 23, creatinine 0.8, GFR >60, sodium 137, potassium 4.8 Total cholesterol 242, triglycerides 164, HDL 65, LDL 144 Hgb 13.4, HCT 39.7, MCV 96.1, platelet 150  Allergies   Allergies  Allergen Reactions   Methotrexate Other (See Comments)   Demerol [Meperidine] Nausea Only   Nickel Rash   Tape Rash and Other (See  Comments)    Medications Prior to Visit:   Outpatient Medications Prior to Visit  Medication Sig Dispense Refill  acetaminophen (TYLENOL) 500 MG tablet Take 1,000 mg by mouth every 6 (six) hours as needed for moderate pain.     albuterol (VENTOLIN HFA) 108 (90 Base) MCG/ACT inhaler Inhale 2 puffs into the lungs every 6 (six) hours as needed for wheezing or shortness of breath. 8 g 6   amLODipine (NORVASC) 10 MG tablet Take 10 mg by mouth daily.      ascorbic acid (VITAMIN C) 1000 MG tablet Take 1,000 mg by mouth daily.     aspirin EC 81 MG tablet Take 81 mg by mouth daily. Swallow whole.     CANNABIDIOL PO Take 2 capsules by mouth at bedtime.     Cholecalciferol (VITAMIN D-3) 125 MCG (5000 UT) TABS Take 1 tablet by mouth daily.     famotidine (PEPCID) 40 MG tablet Take 1 tablet (40 mg total) by mouth at bedtime. 90 tablet 3   fexofenadine (ALLEGRA) 180 MG tablet Take 180 mg by mouth daily as needed for allergies.     hydrochlorothiazide (HYDRODIURIL) 25 MG tablet Take 25 mg by mouth daily.     hydrocortisone-pramoxine (ANALPRAM-HC) 2.5-1 % rectal cream PLACE 1 APPLICATION RECTALLY 3 (THREE) TIMES DAILY. (DRUG NOT COVERED)--STOCK SIZE 120 g 0   inFLIXimab-abda (RENFLEXIS) 100 MG SOLR Inject 1 each into the vein every 8 (eight) weeks.     losartan (COZAAR) 100 MG tablet Take 100 mg by mouth daily.     Magnesium 250 MG TABS Take by mouth.     melatonin 1 MG TABS tablet Take 1 mg by mouth as needed.     meloxicam (MOBIC) 15 MG tablet Take 15 mg by mouth daily.     metoprolol succinate (TOPROL-XL) 25 MG 24 hr tablet TAKE 0.5 TABLETS BY MOUTH DAILY. TAKE WITH OR IMMEDIATELY FOLLOWING A MEAL. 45 tablet 1   mirabegron ER (MYRBETRIQ) 25 MG TB24 tablet Take 1 tablet (25 mg total) by mouth daily. 30 tablet 5   Multiple Vitamin (MULTIVITAMIN) tablet Take 1 tablet by mouth daily.     Omega-3 Fatty Acids (FISH OIL) 1200 MG CAPS Take 1,200 mg by mouth 2 (two) times daily.     omeprazole (PRILOSEC) 40 MG  capsule TAKE 1 CAPSULE BY MOUTH DAILY BEFORE BREAKFAST. TAKE ONE PILL 20-30 MINUTES BEFORE BREAKFAST MEAL. 90 capsule 3   polycarbophil (FIBERCON) 625 MG tablet Take 625 mg by mouth daily.     polyethylene glycol (MIRALAX / GLYCOLAX) 17 g packet Take 17 g by mouth every Monday, Wednesday, and Friday.     rosuvastatin (CRESTOR) 5 MG tablet Take 5 mg by mouth daily.     valACYclovir (VALTREX) 500 MG tablet Take 500 mg by mouth daily as needed.     MELATONIN PO Take 1.5 mg by mouth at bedtime as needed (sleep).     No facility-administered medications prior to visit.   Final Medications at End of Visit    Current Meds  Medication Sig   acetaminophen (TYLENOL) 500 MG tablet Take 1,000 mg by mouth every 6 (six) hours as needed for moderate pain.   albuterol (VENTOLIN HFA) 108 (90 Base) MCG/ACT inhaler Inhale 2 puffs into the lungs every 6 (six) hours as needed for wheezing or shortness of breath.   amLODipine (NORVASC) 10 MG tablet Take 10 mg by mouth daily.    ascorbic acid (VITAMIN C) 1000 MG tablet Take 1,000 mg by mouth daily.   aspirin EC 81 MG tablet Take 81 mg by mouth daily. Swallow whole.   CANNABIDIOL  PO Take 2 capsules by mouth at bedtime.   Cholecalciferol (VITAMIN D-3) 125 MCG (5000 UT) TABS Take 1 tablet by mouth daily.   famotidine (PEPCID) 40 MG tablet Take 1 tablet (40 mg total) by mouth at bedtime.   fexofenadine (ALLEGRA) 180 MG tablet Take 180 mg by mouth daily as needed for allergies.   hydrochlorothiazide (HYDRODIURIL) 25 MG tablet Take 25 mg by mouth daily.   hydrocortisone-pramoxine (ANALPRAM-HC) 2.5-1 % rectal cream PLACE 1 APPLICATION RECTALLY 3 (THREE) TIMES DAILY. (DRUG NOT COVERED)--STOCK SIZE   inFLIXimab-abda (RENFLEXIS) 100 MG SOLR Inject 1 each into the vein every 8 (eight) weeks.   losartan (COZAAR) 100 MG tablet Take 100 mg by mouth daily.   Magnesium 250 MG TABS Take by mouth.   melatonin 1 MG TABS tablet Take 1 mg by mouth as needed.   meloxicam (MOBIC) 15  MG tablet Take 15 mg by mouth daily.   metoprolol succinate (TOPROL-XL) 25 MG 24 hr tablet TAKE 0.5 TABLETS BY MOUTH DAILY. TAKE WITH OR IMMEDIATELY FOLLOWING A MEAL.   mirabegron ER (MYRBETRIQ) 25 MG TB24 tablet Take 1 tablet (25 mg total) by mouth daily.   Multiple Vitamin (MULTIVITAMIN) tablet Take 1 tablet by mouth daily.   Omega-3 Fatty Acids (FISH OIL) 1200 MG CAPS Take 1,200 mg by mouth 2 (two) times daily.   omeprazole (PRILOSEC) 40 MG capsule TAKE 1 CAPSULE BY MOUTH DAILY BEFORE BREAKFAST. TAKE ONE PILL 20-30 MINUTES BEFORE BREAKFAST MEAL.   polycarbophil (FIBERCON) 625 MG tablet Take 625 mg by mouth daily.   polyethylene glycol (MIRALAX / GLYCOLAX) 17 g packet Take 17 g by mouth every Monday, Wednesday, and Friday.   rosuvastatin (CRESTOR) 5 MG tablet Take 5 mg by mouth daily.   valACYclovir (VALTREX) 500 MG tablet Take 500 mg by mouth daily as needed.    Radiology:   No results found.  Cardiac Studies:   PCV ECHOCARDIOGRAM COMPLETE 09/60/4540 Normal LV systolic function with visual EF 60-65%. Left ventricle cavity is normal in size. Mild left ventricular hypertrophy. Normal global wall motion. Normal diastolic filling pattern, normal LAP. No significant valvular heart disease. Compared to study 05/19/2019 no significant change.  PCV MYOCARDIAL PERFUSION WO LEXISCAN 10/10/2021 Normal ECG stress. The patient exercised for 3 minutes and 30 seconds of a Bruce protocol, achieving approximately 4.64 METs. During exercise the patient developed chest pain, and experienced dyspnea and dizziness. Exercise was terminated due to fatigue/weakness.  The calculated Duke Treadmill Score is -0.5. Normal BP response. Myocardial perfusion is normal. Diaphragmatic attenuation noted. Overall LV systolic function is normal without regional wall motion abnormalities. Stress LV EF: 66%. No previous exam available for comparison. low risk  EKG:  01/17/2022: Sinus rhythm at a rate of 68 bpm.  Normal  axis.  No evidence of ischemia or underlying injury pattern..  EKG 12/01/2020, no significant change.  12/01/2020: Sinus rhythm at a rate of 90 bpm.  Normal axis.  Poor R wave progression, cannot exclude anteroseptal infarct old. No evidence of ischemia or injury.   Assessment     ICD-10-CM   1. Essential hypertension  I10     2. Precordial pain  R07.2 EKG 12-Lead     Medications Discontinued During This Encounter  Medication Reason   MELATONIN PO Duplicate     No orders of the defined types were placed in this encounter.  Recommendations:   Courtney Crane is a 71 y.o. Caucasian female with history of hypertension, hyperlipidemia, rheumatoid arthritis, IBS, family history of  premature CAD.   Patient presents for 65-month follow-up.  Last office visit started Toprol-XL 12.5 mg p.o. once daily, advised advised patient to follow-up with PCP for further evaluation of symptoms as well as suspected to related to underlying GERD.  Patient is now asymptomatic with no recurrence of palpitations or chest pain.  Physical exam and EKG are unchanged compared to previous office visit.  Blood pressure is well controlled and patient has upcoming repeat labs with PCP to reevaluate hyperlipidemia.  No changes are made at today's office visit.  Continue metoprolol.  Follow-up in 1 year, sooner if needed.   Alethia Berthold, PA-C 01/17/2022, 3:28 PM Office: [phone]

## 2022-01-16 ENCOUNTER — Other Ambulatory Visit: Payer: Self-pay | Admitting: Student

## 2022-01-17 ENCOUNTER — Ambulatory Visit: Payer: PPO | Admitting: Student

## 2022-01-17 ENCOUNTER — Other Ambulatory Visit: Payer: Self-pay

## 2022-01-17 ENCOUNTER — Encounter: Payer: Self-pay | Admitting: Student

## 2022-01-17 VITALS — BP 137/67 | HR 73 | Temp 98.0°F | Resp 17 | Ht 62.0 in | Wt 160.4 lb

## 2022-01-17 DIAGNOSIS — R072 Precordial pain: Secondary | ICD-10-CM

## 2022-01-17 DIAGNOSIS — I10 Essential (primary) hypertension: Secondary | ICD-10-CM | POA: Diagnosis not present

## 2022-01-24 ENCOUNTER — Other Ambulatory Visit: Payer: Self-pay

## 2022-01-24 ENCOUNTER — Ambulatory Visit (INDEPENDENT_AMBULATORY_CARE_PROVIDER_SITE_OTHER): Payer: PPO | Admitting: Pulmonary Disease

## 2022-01-24 DIAGNOSIS — M05742 Rheumatoid arthritis with rheumatoid factor of left hand without organ or systems involvement: Secondary | ICD-10-CM

## 2022-01-24 DIAGNOSIS — M05741 Rheumatoid arthritis with rheumatoid factor of right hand without organ or systems involvement: Secondary | ICD-10-CM

## 2022-01-24 DIAGNOSIS — R0602 Shortness of breath: Secondary | ICD-10-CM

## 2022-01-24 NOTE — Progress Notes (Signed)
PFT done today. 

## 2022-01-26 ENCOUNTER — Other Ambulatory Visit: Payer: Self-pay

## 2022-01-26 ENCOUNTER — Ambulatory Visit (INDEPENDENT_AMBULATORY_CARE_PROVIDER_SITE_OTHER): Payer: PPO | Admitting: Obstetrics and Gynecology

## 2022-01-26 ENCOUNTER — Encounter: Payer: Self-pay | Admitting: Obstetrics and Gynecology

## 2022-01-26 ENCOUNTER — Ambulatory Visit (INDEPENDENT_AMBULATORY_CARE_PROVIDER_SITE_OTHER)
Admission: RE | Admit: 2022-01-26 | Discharge: 2022-01-26 | Disposition: A | Discharge status: Home / Self Care | Payer: PPO | Source: Ambulatory Visit | Attending: Pulmonary Disease | Admitting: Pulmonary Disease

## 2022-01-26 VITALS — BP 146/71 | HR 76

## 2022-01-26 DIAGNOSIS — R0602 Shortness of breath: Secondary | ICD-10-CM | POA: Diagnosis not present

## 2022-01-26 DIAGNOSIS — N3281 Overactive bladder: Secondary | ICD-10-CM

## 2022-01-26 DIAGNOSIS — M05741 Rheumatoid arthritis with rheumatoid factor of right hand without organ or systems involvement: Secondary | ICD-10-CM

## 2022-01-26 DIAGNOSIS — J9859 Other diseases of mediastinum, not elsewhere classified: Secondary | ICD-10-CM | POA: Diagnosis not present

## 2022-01-26 DIAGNOSIS — N393 Stress incontinence (female) (male): Secondary | ICD-10-CM | POA: Diagnosis not present

## 2022-01-26 DIAGNOSIS — M05742 Rheumatoid arthritis with rheumatoid factor of left hand without organ or systems involvement: Secondary | ICD-10-CM

## 2022-01-26 DIAGNOSIS — M47814 Spondylosis without myelopathy or radiculopathy, thoracic region: Secondary | ICD-10-CM | POA: Diagnosis not present

## 2022-01-26 DIAGNOSIS — K753 Granulomatous hepatitis, not elsewhere classified: Secondary | ICD-10-CM | POA: Diagnosis not present

## 2022-01-26 DIAGNOSIS — I2510 Atherosclerotic heart disease of native coronary artery without angina pectoris: Secondary | ICD-10-CM | POA: Diagnosis not present

## 2022-01-26 NOTE — Progress Notes (Signed)
[facility] ? ? ?Subjective:  ?  ? ?Chief Complaint:  ?Chief Complaint  ?Patient presents with  ? Pessary Check  ? ?History of Present Illness: ?Courtney Crane is a 71 y.o. female with stage II posterior POP, stress incontinence and OAB who presents for a pessary check. She is using a size #0 incontinence dish pessary. The pessary has been working well and overall she has seen a lot less leakage. Occasionally will notice some wetness on her pad that she is not aware of. She denies vaginal bleeding. She has been removing it weekly.  ? ?For OAB, she is on Myrbetriq '25mg'$ . Only urinating at night 1-2 times, before was 3-4 times prior to Myrbetriq. Notices a little improvement during the day as well. ? ?Past Medical History: ?Patient  has a past medical history of Anemia, Anxiety, Arthritis, Asthma, Cataract, Colon polyps, Complication of anesthesia, Diverticulitis, Diverticulosis, Fatty liver, GERD (gastroesophageal reflux disease), HLD (hyperlipidemia), HTN (hypertension), IBS (irritable bowel syndrome), Insomnia, MRSA infection, Osteoarthritis, Osteopenia, PONV (postoperative nausea and vomiting), RA (rheumatoid arthritis) ([place]), Rheumatoid arthritis ([place]), Seasonal allergies, Shingles, Status post dilation of esophageal narrowing, and Vitamin D deficiency.  ? ?Past Surgical History: ?She  has a past surgical history that includes Appendectomy (1975); Total abdominal hysterectomy (1988); Tonsillectomy; bladder tack (1990); Rotator cuff repair (Right, 2005); Wrist reconstruction (Right, 2008); Meniscus repair (Right, 2013); Total knee arthroplasty (Left); Cholecystectomy (N/A, 01/31/2017); Incision and drainage perirectal abscess (N/A, 12/02/2017); and Cataract extraction, bilateral (Bilateral).  ? ?Medications: ?She has a current medication list which includes the following prescription(s): acetaminophen, albuterol, amlodipine, ascorbic acid, aspirin ec, cannabidiol, vitamin d-3, famotidine,  fexofenadine, hydrochlorothiazide, hydrocortisone-pramoxine, renflexis, losartan, magnesium, melatonin, meloxicam, metoprolol succinate, mirabegron er, multivitamin, fish oil, omeprazole, polycarbophil, polyethylene glycol, rosuvastatin, and valacyclovir.  ? ?Allergies: ?Patient is allergic to methotrexate, demerol [meperidine], nickel, and tape.  ? ?Social History: ?Patient  reports that she has never smoked. She has never used smokeless tobacco. She reports current alcohol use of about 7.0 standard drinks per week. She reports that she does not use drugs.  ? ?  ? ?Objective:  ?  ?Physical Exam: ?BP (!) 146/71   Pulse 76  ?Gen: No apparent distress, A&O x 3. ?Detailed Urogynecologic Evaluation:  ?Pelvic Exam: Normal external female genitalia; Bartholin's and Skene's glands normal in appearance; urethral meatus normal in appearance, no urethral masses or discharge. The pessary was noted to be in place. It was removed and cleaned. Speculum exam revealed no lesions in the vagina. The pessary was replaced. It was comfortable to the patient and fit well.  ? ?POP-Q:  ?  ?POP-Q ?  ?-3  ?                                          Aa   ?-3 ?                                          Ba   ?-5.5  ?                                            C  ?  ?3.5  ?  Gh   ?5  ?                                          Pb   ?7  ?                                          tvl  ?  ?0  ?                                          Ap   ?0  ?                                          Bp   ?   ?                                            D  ?  ?  ?  ? ?Assessment/Plan:  ?  ?Assessment: ?Ms. Leisinger is a 71 y.o. with stage II pelvic organ prolapse, stress incontinence, and OAB here for a pessary check. She is doing well. ? ?Plan: ?- Continue #0 incontinence dish pessary. Remove weekly or as needed.  ?- Continue Myrbetriq '25mg'$ .  ? ?Follow up 6 months or sooner if needed ? ?Jaquita Folds, MD ? ? ?Time  spent: I spent 20 minutes dedicated to the care of this patient on the date of this encounter to include pre-visit review of records, face-to-face time with the patient and post visit documentation. ? ?

## 2022-02-04 LAB — PULMONARY FUNCTION TEST
DL/VA % pred: 99 %
DL/VA: 4.13 ml/min/mmHg/L
DLCO cor % pred: 97 %
DLCO cor: 18.34 ml/min/mmHg
DLCO unc % pred: 95 %
DLCO unc: 18.05 ml/min/mmHg
FEF 25-75 Post: 1.91 L/sec
FEF 25-75 Pre: 1.28 L/sec
FEF2575-%Change-Post: 49 %
FEF2575-%Pred-Post: 103 %
FEF2575-%Pred-Pre: 69 %
FEV1-%Change-Post: 10 %
FEV1-%Pred-Post: 96 %
FEV1-%Pred-Pre: 87 %
FEV1-Post: 2.1 L
FEV1-Pre: 1.9 L
FEV1FVC-%Change-Post: 6 %
FEV1FVC-%Pred-Pre: 94 %
FEV6-%Change-Post: 4 %
FEV6-%Pred-Post: 99 %
FEV6-%Pred-Pre: 95 %
FEV6-Post: 2.74 L
FEV6-Pre: 2.62 L
FEV6FVC-%Change-Post: 0 %
FEV6FVC-%Pred-Post: 104 %
FEV6FVC-%Pred-Pre: 104 %
FVC-%Change-Post: 3 %
FVC-%Pred-Post: 95 %
FVC-%Pred-Pre: 91 %
FVC-Post: 2.74 L
FVC-Pre: 2.64 L
Post FEV1/FVC ratio: 77 %
Post FEV6/FVC ratio: 100 %
Pre FEV1/FVC ratio: 72 %
Pre FEV6/FVC Ratio: 99 %
RV % pred: 104 %
RV: 2.23 L
TLC % pred: 96 %
TLC: 4.75 L

## 2022-02-12 ENCOUNTER — Encounter: Payer: Self-pay | Admitting: Gastroenterology

## 2022-02-21 DIAGNOSIS — M0579 Rheumatoid arthritis with rheumatoid factor of multiple sites without organ or systems involvement: Secondary | ICD-10-CM | POA: Diagnosis not present

## 2022-02-21 DIAGNOSIS — Z79899 Other long term (current) drug therapy: Secondary | ICD-10-CM | POA: Diagnosis not present

## 2022-02-25 NOTE — Progress Notes (Signed)
Ty, please let patient know that I have reviewed her CT chest. We can discuss in more detail at her next office visit. She ill need to see PCP regarding coronary cacification and thyroid ultrasound.  ? ?IMPRESSION: ?1. No evidence of interstitial lung disease. ?2. Small parenchymal band in the posterior inferior lingula, ?compatible with nonspecific mild postinfectious/postinflammatory ?scarring. ?3. One vessel coronary atherosclerosis. ?4. Diffuse hepatic steatosis. ?5. Small circumscribed 1.1 cm rounded cystic structure in the left ?paratracheal region at the level of the thoracic inlet, potentially ?an exophytic inferior left thyroid nodule. Consider further ?evaluation with thyroid ultrasound. ?6. Mild left colonic diverticulosis. ?7. Tiny 2 mm solid peripheral right upper lobe pulmonary nodule. No ?follow-up needed if patient is low-risk.This recommendation follows ?the consensus statement: Guidelines for Management of Incidental ?Pulmonary Nodules Detected on CT Images: From the Fleischner Society ?2017; Radiology 2017; [id number]. ?8. Aortic Atherosclerosis (ICD10-I70.0). ?? ? ?Thanks, ? ?BLI ? ?Garner Nash, DO ?[facility] ?02/25/2022 2:35 PM  ?

## 2022-02-26 DIAGNOSIS — D2262 Melanocytic nevi of left upper limb, including shoulder: Secondary | ICD-10-CM | POA: Diagnosis not present

## 2022-02-26 DIAGNOSIS — L821 Other seborrheic keratosis: Secondary | ICD-10-CM | POA: Diagnosis not present

## 2022-02-26 DIAGNOSIS — L812 Freckles: Secondary | ICD-10-CM | POA: Diagnosis not present

## 2022-02-26 DIAGNOSIS — D2261 Melanocytic nevi of right upper limb, including shoulder: Secondary | ICD-10-CM | POA: Diagnosis not present

## 2022-03-28 ENCOUNTER — Encounter: Payer: Self-pay | Admitting: Pulmonary Disease

## 2022-03-28 ENCOUNTER — Ambulatory Visit: Payer: PPO | Admitting: Pulmonary Disease

## 2022-03-28 VITALS — BP 136/60 | HR 86 | Temp 98.1°F | Ht 62.0 in | Wt 163.6 lb

## 2022-03-28 DIAGNOSIS — R052 Subacute cough: Secondary | ICD-10-CM

## 2022-03-28 DIAGNOSIS — M05742 Rheumatoid arthritis with rheumatoid factor of left hand without organ or systems involvement: Secondary | ICD-10-CM | POA: Diagnosis not present

## 2022-03-28 DIAGNOSIS — R0609 Other forms of dyspnea: Secondary | ICD-10-CM

## 2022-03-28 DIAGNOSIS — M05741 Rheumatoid arthritis with rheumatoid factor of right hand without organ or systems involvement: Secondary | ICD-10-CM

## 2022-03-28 MED ORDER — BREZTRI AEROSPHERE 160-9-4.8 MCG/ACT IN AERO: 2.0000 | INHALATION_SPRAY | Freq: Two times a day (BID) | RESPIRATORY_TRACT | 1 refills | Status: DC

## 2022-03-28 MED ORDER — BREZTRI AEROSPHERE 160-9-4.8 MCG/ACT IN AERO: 2.0000 | INHALATION_SPRAY | Freq: Two times a day (BID) | RESPIRATORY_TRACT | 0 refills | Status: DC

## 2022-03-28 NOTE — Progress Notes (Signed)
? ?Synopsis: Referred in Feb 2023 for chest pains  by Velna Hatchet, MD ? ?Subjective:  ? ?PATIENT ID: Courtney Crane GENDER: female DOB: 11-28-1950, MRN: [id number] ? ?Chief Complaint  ?Patient presents with  ? Follow-up  ?  Follow up. Patient says breathing has gotten worse. Patient has a bad cough and has the cough for 3 weeks.   ? ? ?PMH HTN, HLD, IBS, diverticulitis, Rheumatoid Arthritis, OA. She on infliximab q6 weeks. On meloxicam. New dx of RA as of 2018.  Patient had a recent ER visit for evaluation.  CTA of the chest completed which was negative for PE.  From respiratory standpoint she still has shortness of breath with exertion.  She occasionally has flareups of her RA still on infliximab.  Currently using meloxicam for pain control.  We reviewed her CT findings today in the office.  Her shortness of breath is predominantly associated with exertion.  She also noticed when it is hot and humid outside occasionally she feels short of breath. ? ?OV 03/28/2022: Here today for follow-up.  She had PFTs completed as well as HRCT imaging.  HRCT imaging was reassuring with small nodule still persistent however no evidence of interstitial lung disease or ILD related to RA.  Also her lung function was normal we reviewed this PFT today in the office with patient and patient's husband present today.  She still has cough and shortness of breath.  She did have asthma as a child she does feel like her cough is still there and take some time for it to clear.  She has been on prednisone in the past but not really sure how she was breathing when she was on it mainly this was due to her RA at the time.  She also thinks that her albuterol has been helping and she when she uses her albuterol she is able to complete her exercises and walk outside better. ? ? ?Past Medical History:  ?Diagnosis Date  ? Anemia   ? Anxiety   ? Arthritis   ? Asthma   ? Cataract   ? Colon polyps   ? Complication of anesthesia   ? "sometimes I dont  like to wake up after " ie difficult to awaken   ? Diverticulitis   ? Diverticulosis   ? Fatty liver   ? GERD (gastroesophageal reflux disease)   ? HLD (hyperlipidemia)   ? HTN (hypertension)   ? IBS (irritable bowel syndrome)   ? Insomnia   ? MRSA infection   ? Osteoarthritis   ? Osteopenia   ? PONV (postoperative nausea and vomiting)   ? RA (rheumatoid arthritis) ([place])   ? Rheumatoid arthritis ([place])   ? Seasonal allergies   ? Shingles   ? Status post dilation of esophageal narrowing   ? Vitamin D deficiency   ?  ? ?Family History  ?Problem Relation Age of Onset  ? Colon polyps Mother   ? Diabetes Mother   ? Heart disease Mother   ? Kidney cancer Mother   ? Arthritis Mother   ? Hypertension Mother   ? Hyperlipidemia Mother   ? Colon polyps Son   ? CAD Brother   ? Esophageal cancer Brother   ? Dementia Father   ? Colon cancer Neg Hx   ? Stomach cancer Neg Hx   ? Rectal cancer Neg Hx   ? Prostate cancer Neg Hx   ?  ? ?Past Surgical History:  ?Procedure Laterality Date  ? APPENDECTOMY  1975  ?  bladder tack  1990  ? CATARACT EXTRACTION, BILATERAL Bilateral   ? CHOLECYSTECTOMY N/A 01/31/2017  ? Procedure: LAPAROSCOPIC CHOLECYSTECTOMY WITH INTRAOPERATIVE CHOLANGIOGRAM;  Surgeon: Armandina Gemma, MD;  Location: [facility];  Service: General;  Laterality: N/A;  ? INCISION AND DRAINAGE PERIRECTAL ABSCESS N/A 12/02/2017  ? Procedure: IRRIGATION AND DEBRIDEMENT PERINEAL ABSCESS;  Surgeon: Greer Pickerel, MD;  Location: [facility];  Service: General;  Laterality: N/A;  ? MENISCUS REPAIR Right 2013  ? ROTATOR CUFF REPAIR Right 2005  ? TONSILLECTOMY    ? as a child  ? TOTAL ABDOMINAL HYSTERECTOMY  1988  ? TOTAL KNEE ARTHROPLASTY Left   ? WRIST RECONSTRUCTION Right 2008  ? ? ?Social History  ? ?Socioeconomic History  ? Marital status: Married  ?  Spouse name: Not on file  ? Number of children: 2  ? Years of education: Not on file  ? Highest education level: Not on file  ?Occupational History  ? Occupation: retired  ?Tobacco Use  ? Smoking status:  Never  ? Smokeless tobacco: Never  ?Vaping Use  ? Vaping Use: Never used  ?Substance and Sexual Activity  ? Alcohol use: Yes  ?  Alcohol/week: 7.0 standard drinks  ?  Types: 7 Standard drinks or equivalent per week  ?  Comment: wine daily  ? Drug use: No  ? Sexual activity: Yes  ?  Birth control/protection: None  ?Other Topics Concern  ? Not on file  ?Social History Narrative  ? Retired from [employer]  ? Lives at home with husband ; one level home  ? Right handed  ? Highest level of education:  Some college  ? ?Social Determinants of Health  ? ?Financial Resource Strain: Not on file  ?Food Insecurity: Not on file  ?Transportation Needs: Not on file  ?Physical Activity: Not on file  ?Stress: Not on file  ?Social Connections: Not on file  ?Intimate Partner Violence: Not on file  ?  ? ?Allergies  ?Allergen Reactions  ? Methotrexate Other (See Comments)  ? Demerol [Meperidine] Nausea Only  ? Nickel Rash  ? Tape Rash and Other (See Comments)  ?  ? ?Outpatient Medications Prior to Visit  ?Medication Sig Dispense Refill  ? acetaminophen (TYLENOL) 500 MG tablet Take 1,000 mg by mouth every 6 (six) hours as needed for moderate pain.    ? albuterol (VENTOLIN HFA) 108 (90 Base) MCG/ACT inhaler Inhale 2 puffs into the lungs every 6 (six) hours as needed for wheezing or shortness of breath. 8 g 6  ? amLODipine (NORVASC) 10 MG tablet Take 10 mg by mouth daily.     ? ascorbic acid (VITAMIN C) 1000 MG tablet Take 1,000 mg by mouth daily.    ? aspirin EC 81 MG tablet Take 81 mg by mouth daily. Swallow whole.    ? CANNABIDIOL PO Take 2 capsules by mouth at bedtime.    ? Cholecalciferol (VITAMIN D-3) 125 MCG (5000 UT) TABS Take 1 tablet by mouth daily.    ? famotidine (PEPCID) 40 MG tablet Take 1 tablet (40 mg total) by mouth at bedtime. 90 tablet 3  ? fexofenadine (ALLEGRA) 180 MG tablet Take 180 mg by mouth daily as needed for allergies.    ? hydrochlorothiazide (HYDRODIURIL) 25 MG tablet Take 25 mg by mouth daily.    ?  hydrocortisone-pramoxine (ANALPRAM-HC) 2.5-1 % rectal cream PLACE 1 APPLICATION RECTALLY 3 (THREE) TIMES DAILY. (DRUG NOT COVERED)--STOCK SIZE 120 g 0  ? inFLIXimab-abda (RENFLEXIS) 100 MG SOLR Inject 1 each into  the vein every 8 (eight) weeks.    ? losartan (COZAAR) 100 MG tablet Take 100 mg by mouth daily.    ? Magnesium 250 MG TABS Take by mouth.    ? melatonin 1 MG TABS tablet Take 1 mg by mouth as needed.    ? meloxicam (MOBIC) 15 MG tablet Take 15 mg by mouth daily.    ? metoprolol succinate (TOPROL-XL) 25 MG 24 hr tablet TAKE 0.5 TABLETS BY MOUTH DAILY. TAKE WITH OR IMMEDIATELY FOLLOWING A MEAL. 45 tablet 1  ? mirabegron ER (MYRBETRIQ) 25 MG TB24 tablet Take 1 tablet (25 mg total) by mouth daily. 30 tablet 5  ? Multiple Vitamin (MULTIVITAMIN) tablet Take 1 tablet by mouth daily.    ? Omega-3 Fatty Acids (FISH OIL) 1200 MG CAPS Take 1,200 mg by mouth 2 (two) times daily.    ? omeprazole (PRILOSEC) 40 MG capsule TAKE 1 CAPSULE BY MOUTH DAILY BEFORE BREAKFAST. TAKE ONE PILL 20-30 MINUTES BEFORE BREAKFAST MEAL. 90 capsule 3  ? polycarbophil (FIBERCON) 625 MG tablet Take 625 mg by mouth daily.    ? polyethylene glycol (MIRALAX / GLYCOLAX) 17 g packet Take 17 g by mouth every Monday, Wednesday, and Friday.    ? rosuvastatin (CRESTOR) 5 MG tablet Take 5 mg by mouth daily.    ? valACYclovir (VALTREX) 500 MG tablet Take 500 mg by mouth daily as needed.    ? ?No facility-administered medications prior to visit.  ? ? ?Review of Systems  ?Constitutional:  Negative for chills, fever, malaise/fatigue and weight loss.  ?HENT:  Negative for hearing loss, sore throat and tinnitus.   ?Eyes:  Negative for blurred vision and double vision.  ?Respiratory:  Positive for cough and shortness of breath. Negative for hemoptysis, sputum production, wheezing and stridor.   ?Cardiovascular:  Negative for chest pain, palpitations, orthopnea, leg swelling and PND.  ?Gastrointestinal:  Negative for abdominal pain, constipation, diarrhea,  heartburn, nausea and vomiting.  ?Genitourinary:  Negative for dysuria, hematuria and urgency.  ?Musculoskeletal:  Negative for joint pain and myalgias.  ?Skin:  Negative for itching and rash.  ?Neurological:  Negativ

## 2022-03-28 NOTE — Patient Instructions (Signed)
Thank you for visiting Dr. Valeta Harms at [facility]. ?Today we recommend the following: ? ?Breztri samples and new prescription today  ? ?Return in about 8 weeks (around 05/23/2022) for with APP or Dr. Valeta Harms. ? ? ? ?Please do your part to reduce the spread of COVID-19.  ? ?

## 2022-04-02 DIAGNOSIS — H04123 Dry eye syndrome of bilateral lacrimal glands: Secondary | ICD-10-CM | POA: Diagnosis not present

## 2022-04-02 DIAGNOSIS — Z961 Presence of intraocular lens: Secondary | ICD-10-CM | POA: Diagnosis not present

## 2022-04-02 DIAGNOSIS — M069 Rheumatoid arthritis, unspecified: Secondary | ICD-10-CM | POA: Diagnosis not present

## 2022-04-04 ENCOUNTER — Encounter: Payer: Self-pay | Admitting: Pulmonary Disease

## 2022-04-04 NOTE — Telephone Encounter (Signed)
Mychart message sent by pt: ? ?Courtney Crane "Courtney Crane"  P [facility] (supporting Icard, Bradley L, DO) 2 hours ago (8:36 AM)  ? ?My cough is getting better. Thank you for that. However, there is now phlegm associated with the cough at times. Not clear, sinus drainage. Green yucky kind. Making sure this is just part of the process. ?  ?Thank you. ? ? ? ?With Dr. Valeta Harms not being available, sending to provider of the day. Dr. Erin Fulling, please advise on this for pt. ?

## 2022-04-04 NOTE — Telephone Encounter (Signed)
Patient was advised to update [place] on if she develops a fever, chills or sweats. Patient confirmed. Nothing further needed  ?

## 2022-04-06 DIAGNOSIS — Z79899 Other long term (current) drug therapy: Secondary | ICD-10-CM | POA: Diagnosis not present

## 2022-04-06 DIAGNOSIS — Z6829 Body mass index (BMI) 29.0-29.9, adult: Secondary | ICD-10-CM | POA: Diagnosis not present

## 2022-04-06 DIAGNOSIS — M1991 Primary osteoarthritis, unspecified site: Secondary | ICD-10-CM | POA: Diagnosis not present

## 2022-04-06 DIAGNOSIS — E669 Obesity, unspecified: Secondary | ICD-10-CM | POA: Diagnosis not present

## 2022-04-06 DIAGNOSIS — M0579 Rheumatoid arthritis with rheumatoid factor of multiple sites without organ or systems involvement: Secondary | ICD-10-CM | POA: Diagnosis not present

## 2022-04-10 DIAGNOSIS — M0579 Rheumatoid arthritis with rheumatoid factor of multiple sites without organ or systems involvement: Secondary | ICD-10-CM | POA: Diagnosis not present

## 2022-04-30 ENCOUNTER — Other Ambulatory Visit: Payer: Self-pay

## 2022-04-30 ENCOUNTER — Encounter: Payer: Self-pay | Admitting: Gastroenterology

## 2022-04-30 MED ORDER — HYDROCORT-PRAMOXINE (PERIANAL) 2.5-1 % EX CREA: TOPICAL_CREAM | CUTANEOUS | 0 refills | Status: DC

## 2022-05-09 DIAGNOSIS — K648 Other hemorrhoids: Secondary | ICD-10-CM | POA: Diagnosis not present

## 2022-05-09 DIAGNOSIS — K644 Residual hemorrhoidal skin tags: Secondary | ICD-10-CM | POA: Diagnosis not present

## 2022-05-13 ENCOUNTER — Encounter: Payer: Self-pay | Admitting: Pulmonary Disease

## 2022-05-16 NOTE — Telephone Encounter (Signed)
Ronney Asters, can you see if she can come in at 930 tomorrow so I can squeeze her in between my 930 and 10 o'clock MyChart? Thanks!

## 2022-05-17 ENCOUNTER — Ambulatory Visit: Payer: PPO | Admitting: Nurse Practitioner

## 2022-05-17 ENCOUNTER — Encounter: Payer: Self-pay | Admitting: Nurse Practitioner

## 2022-05-17 ENCOUNTER — Ambulatory Visit (INDEPENDENT_AMBULATORY_CARE_PROVIDER_SITE_OTHER): Payer: PPO

## 2022-05-17 VITALS — BP 146/60 | HR 73 | Temp 98.3°F | Ht 62.0 in | Wt 157.2 lb

## 2022-05-17 DIAGNOSIS — B370 Candidal stomatitis: Secondary | ICD-10-CM | POA: Diagnosis not present

## 2022-05-17 DIAGNOSIS — J4541 Moderate persistent asthma with (acute) exacerbation: Secondary | ICD-10-CM | POA: Diagnosis not present

## 2022-05-17 DIAGNOSIS — R052 Subacute cough: Secondary | ICD-10-CM

## 2022-05-17 DIAGNOSIS — J45998 Other asthma: Secondary | ICD-10-CM

## 2022-05-17 DIAGNOSIS — R059 Cough, unspecified: Secondary | ICD-10-CM | POA: Diagnosis not present

## 2022-05-17 HISTORY — DX: Other asthma: J45.998

## 2022-05-17 LAB — CBC WITH DIFFERENTIAL/PLATELET
Basophils Absolute: 0.1 10*3/uL (ref 0.0–0.1)
Basophils Relative: 1.3 % (ref 0.0–3.0)
Eosinophils Absolute: 0.4 10*3/uL (ref 0.0–0.7)
Eosinophils Relative: 5.8 % — ABNORMAL HIGH (ref 0.0–5.0)
HCT: 39.2 % (ref 36.0–46.0)
Hemoglobin: 13 g/dL (ref 12.0–15.0)
Lymphocytes Relative: 32 % (ref 12.0–46.0)
Lymphs Abs: 2.1 10*3/uL (ref 0.7–4.0)
MCHC: 33.2 g/dL (ref 30.0–36.0)
MCV: 91.4 fl (ref 78.0–100.0)
Monocytes Absolute: 1 10*3/uL (ref 0.1–1.0)
Monocytes Relative: 15.6 % — ABNORMAL HIGH (ref 3.0–12.0)
Neutro Abs: 3 10*3/uL (ref 1.4–7.7)
Neutrophils Relative %: 45.3 % (ref 43.0–77.0)
Platelets: 173 10*3/uL (ref 150.0–400.0)
RBC: 4.29 Mil/uL (ref 3.87–5.11)
RDW: 15 % (ref 11.5–15.5)
WBC: 6.5 10*3/uL (ref 4.0–10.5)

## 2022-05-17 LAB — POCT EXHALED NITRIC OXIDE: FeNO level (ppb): 29

## 2022-05-17 MED ORDER — PREDNISONE 20 MG PO TABS: 40.0000 mg | ORAL_TABLET | Freq: Every day | ORAL | 0 refills | Status: AC

## 2022-05-17 MED ORDER — NYSTATIN 100000 UNIT/ML MT SUSP: 5.0000 mL | Freq: Four times a day (QID) | OROMUCOSAL | 0 refills | Status: AC

## 2022-05-17 NOTE — Patient Instructions (Addendum)
Continue Breztri 2 puffs Twice daily. Brush tongue and rinse mouth afterwards Continue Albuterol inhaler 2 puffs or 3 mL neb every 6 hours as needed for shortness of breath or wheezing. Notify if symptoms persist despite rescue inhaler/neb use. Continue omeprazole 40 mg daily before breakfast  Continue pepcid 1 tab At bedtime  Continue allegra 180 mg daily for allergies   Prednisone 40 mg daily for 5 days. Take in AM with food Mucinex DM Twice daily for cough/congestion Chlorpheniramine tab 4 mg At bedtime for cough  Labs today - allergen panel, CBC with diff  Chest x ray.   We will start the process for biologic therapy for your asthma and will be in touch after we get labs back.  Follow up in 2 weeks with Courtney Crane or Courtney Crane. If symptoms do not improve or worsen, please contact office for sooner follow up or seek emergency care.

## 2022-05-17 NOTE — Progress Notes (Signed)
$'@Patient'u$  ID: Courtney Crane, female    DOB: 11/04/1951, 71 y.o.   MRN: [id number]  Chief Complaint  Patient presents with   Follow-up    Follow up. Patient says she still gets winded. Patient also states that her cough is still here and she's coughing up green sputum.      Referring provider: Velna Hatchet, MD  HPI: 71 year old female, never smoker followed for asthma.  She is a patient of Dr. Juline Patch and last seen in office 03/28/2022.  She was originally referred for shortness of breath and cough.  HRCT was reassuring which did not show any evidence of ILD and previous nodule was stable.  Past medical history significant for RA on infliximab every 6 weeks, hypertension, HLD, IBS, diverticulitis, OA.  She is on meloxicam for pain control.  TEST/EVENTS:  01/24/2022 PFTs: FVC 91, FEV1 87, ratio 77, TLC 96, DLCOcor 97.  Normal spirometry with borderline bronchodilator response (10% change) and mid flow reversibility. 01/26/2022 HRCT chest: Atherosclerosis and CAD is present.  There is a small circumscribed 1.1 cm rounded cystic structure in the left paratracheal region, potentially an exophytic inferior left thyroid nodule.  There is a tiny 2 mm solid peripheral right upper lobe pulmonary nodule.  No evidence of ILD.  There is diffuse hepatic steatosis.  03/28/2022: OV with Dr. Valeta Harms.  HRCT reassuring with no evidence of ILD.  PFTs did not show any restriction.  She still had a cough and shortness of breath.  Did have asthma as a child.  Previously on prednisone for her RA but does not really remember how it did for her breathing.  Does feel like albuterol has been helping especially with activity tolerance.  Recommended trial of Breztri to see if she has any benefit to ICS and bronchodilators.  Also advised repeat pulmonary function testing in the next 3 to 5 years to monitor for RA ILD.  05/17/2022: Today - acute Patient presents today for acute visit.  She had contacted the office a few days  ago reporting that she did have improvement in her symptoms with Breztri; however, that improvement has plateaued and her cough remains persistent. Her cough was previously congest but rarely productive and now she is coughing up green phlegm. She does feel like her breathing has improved and hasn't noticed much wheezing. She denies any fevers, chills, hemoptysis, interim sick exposures, URI symptoms or leg swelling. She continues on [place] Twice daily. She is on allegra for allergies and pepcid and omeprazole for GERD with good control of these symptoms.   Allergies  Allergen Reactions   Methotrexate Other (See Comments)   Demerol [Meperidine] Nausea Only   Nickel Rash   Tape Rash and Other (See Comments)    Immunization History  Administered Date(s) Administered   Influenza,inj,quad, With Preservative 07/20/2018, 08/20/2019   PFIZER(Purple Top)SARS-COV-2 Vaccination 07/14/2020   Tdap 11/20/2011   Zoster, Live 11/19/2012    Past Medical History:  Diagnosis Date   Anemia    Anxiety    Arthritis    Asthma    Cataract    Colon polyps    Complication of anesthesia    "sometimes I dont like to wake up after " ie difficult to awaken    Diverticulitis    Diverticulosis    Fatty liver    GERD (gastroesophageal reflux disease)    HLD (hyperlipidemia)    HTN (hypertension)    IBS (irritable bowel syndrome)    Insomnia    MRSA infection  Osteoarthritis    Osteopenia    PONV (postoperative nausea and vomiting)    RA (rheumatoid arthritis) ([facility])    Rheumatoid arthritis ([facility])    Seasonal allergies    Shingles    Status post dilation of esophageal narrowing    Vitamin D deficiency     Tobacco History: Social History   Tobacco Use  Smoking Status Never  Smokeless Tobacco Never   Counseling given: Not Answered   Outpatient Medications Prior to Visit  Medication Sig Dispense Refill   acetaminophen (TYLENOL) 500 MG tablet Take 1,000 mg by mouth every 6 (six) hours as  needed for moderate pain.     albuterol (VENTOLIN HFA) 108 (90 Base) MCG/ACT inhaler Inhale 2 puffs into the lungs every 6 (six) hours as needed for wheezing or shortness of breath. 8 g 6   amLODipine (NORVASC) 10 MG tablet Take 10 mg by mouth daily.      ascorbic acid (VITAMIN C) 1000 MG tablet Take 1,000 mg by mouth daily.     aspirin EC 81 MG tablet Take 81 mg by mouth daily. Swallow whole.     Budeson-Glycopyrrol-Formoterol (BREZTRI AEROSPHERE) 160-9-4.8 MCG/ACT AERO Inhale 2 puffs into the lungs in the morning and at bedtime. 10.7 g 1   Budeson-Glycopyrrol-Formoterol (BREZTRI AEROSPHERE) 160-9-4.8 MCG/ACT AERO Inhale 2 puffs into the lungs in the morning and at bedtime. 10.7 g 0   CANNABIDIOL PO Take 2 capsules by mouth at bedtime.     Cholecalciferol (VITAMIN D-3) 125 MCG (5000 UT) TABS Take 1 tablet by mouth daily.     famotidine (PEPCID) 40 MG tablet Take 1 tablet (40 mg total) by mouth at bedtime. 90 tablet 3   fexofenadine (ALLEGRA) 180 MG tablet Take 180 mg by mouth daily as needed for allergies.     hydrochlorothiazide (HYDRODIURIL) 25 MG tablet Take 25 mg by mouth daily.     hydrocortisone-pramoxine (ANALPRAM-HC) 2.5-1 % rectal cream PLACE 1 APPLICATION RECTALLY 3 (THREE) TIMES DAILY. (DRUG NOT COVERED)--STOCK SIZE 120 g 0   inFLIXimab-abda (RENFLEXIS) 100 MG SOLR Inject 1 each into the vein every 8 (eight) weeks.     leflunomide (ARAVA) 20 MG tablet 1 tablet     losartan (COZAAR) 100 MG tablet Take 100 mg by mouth daily.     Magnesium 250 MG TABS Take by mouth.     melatonin 1 MG TABS tablet Take 1 mg by mouth as needed.     meloxicam (MOBIC) 15 MG tablet Take 15 mg by mouth daily.     metoprolol succinate (TOPROL-XL) 25 MG 24 hr tablet TAKE 0.5 TABLETS BY MOUTH DAILY. TAKE WITH OR IMMEDIATELY FOLLOWING A MEAL. 45 tablet 1   mirabegron ER (MYRBETRIQ) 25 MG TB24 tablet Take 1 tablet (25 mg total) by mouth daily. 30 tablet 5   Multiple Vitamin (MULTIVITAMIN) tablet Take 1 tablet by  mouth daily.     Omega-3 Fatty Acids (FISH OIL) 1200 MG CAPS Take 1,200 mg by mouth 2 (two) times daily.     omeprazole (PRILOSEC) 40 MG capsule TAKE 1 CAPSULE BY MOUTH DAILY BEFORE BREAKFAST. TAKE ONE PILL 20-30 MINUTES BEFORE BREAKFAST MEAL. 90 capsule 3   polycarbophil (FIBERCON) 625 MG tablet Take 625 mg by mouth daily.     polyethylene glycol (MIRALAX / GLYCOLAX) 17 g packet Take 17 g by mouth every Monday, Wednesday, and Friday.     rosuvastatin (CRESTOR) 5 MG tablet Take 5 mg by mouth daily.     valACYclovir (VALTREX) 500  MG tablet Take 500 mg by mouth daily as needed.     No facility-administered medications prior to visit.     Review of Systems:   Constitutional: No weight loss or gain, night sweats, fevers, chills, fatigue, or lassitude. HEENT: No headaches, difficulty swallowing, tooth/dental problems, or sore throat. No sneezing, itching, ear ache, nasal congestion, or post nasal drip CV:  No chest pain, orthopnea, PND, swelling in lower extremities, anasarca, dizziness, palpitations, syncope Resp: +shortness of breath with exertion (improved); productive cough. No excess mucus or change in color of mucus. No productive or non-productive. No hemoptysis. No wheezing.  No chest wall deformity GI:  No heartburn, indigestion, abdominal pain, nausea, vomiting, diarrhea, change in bowel habits, loss of appetite, bloody stools.  Skin: No rash, lesions, ulcerations MSK:  No joint pain or swelling.  No decreased range of motion.  No back pain. Neuro: No dizziness or lightheadedness.  Psych: No depression or anxiety. Mood stable.     Physical Exam:  BP (!) 146/60 (BP Location: Right Arm, Patient Position: Sitting, Cuff Size: Normal)   Pulse 73   Temp 98.3 F (36.8 C) (Oral)   Ht '5\' 2"'$  (1.575 m)   Wt 157 lb 3.2 oz (71.3 kg)   SpO2 98%   BMI 28.75 kg/m   GEN: Pleasant, interactive, well-appearing; in no acute distress. HEENT:  Normocephalic and atraumatic. PERRLA. Sclera white.  Nasal turbinates pink, moist and patent bilaterally. No rhinorrhea present. Oropharynx pink and moist, without exudate or edema. No lesions, ulcerations, or postnasal drip.  NECK:  Supple w/ fair ROM. No JVD present. Normal carotid impulses w/o bruits. Thyroid symmetrical with no goiter or nodules palpated. No lymphadenopathy.   CV: RRR, no m/r/g, no peripheral edema. Pulses intact, +2 bilaterally. No cyanosis, pallor or clubbing. PULMONARY:  Unlabored, regular breathing. Minimal end expiratory wheezes bilaterally A&P. No accessory muscle use. No dullness to percussion. GI: BS present and normoactive. Soft, non-tender to palpation. No organomegaly or masses detected. No CVA tenderness. MSK: No erythema, warmth or tenderness. Cap refil <2 sec all extrem. No deformities or joint swelling noted.  Neuro: A/Ox3. No focal deficits noted.   Skin: Warm, no lesions or rashe Psych: Normal affect and behavior. Judgement and thought content appropriate.     Lab Results:  CBC    Component Value Date/Time   WBC 6.5 05/17/2022 1016   RBC 4.29 05/17/2022 1016   HGB 13.0 05/17/2022 1016   HCT 39.2 05/17/2022 1016   PLT 173.0 05/17/2022 1016   MCV 91.4 05/17/2022 1016   MCH 31.0 08/31/2021 1352   MCHC 33.2 05/17/2022 1016   RDW 15.0 05/17/2022 1016   LYMPHSABS 2.1 05/17/2022 1016   MONOABS 1.0 05/17/2022 1016   EOSABS 0.4 05/17/2022 1016   BASOSABS 0.1 05/17/2022 1016    BMET    Component Value Date/Time   NA 136 11/27/2021 1124   K 4.1 11/27/2021 1124   CL 100 11/27/2021 1124   CO2 28 11/27/2021 1124   GLUCOSE 98 11/27/2021 1124   BUN 23 11/27/2021 1124   CREATININE 0.90 11/27/2021 1124   CALCIUM 9.7 11/27/2021 1124   GFRNONAA 60 (L) 08/31/2021 1352   GFRAA >60 12/05/2017 0628    BNP    Component Value Date/Time   BNP 19.7 08/31/2021 1525     Imaging:  DG Chest 2 View  Result Date: 05/17/2022 CLINICAL DATA:  71 year old female with productive cough. EXAM: CHEST - 2 VIEW  COMPARISON:  Chest CT 01/26/2022 and earlier.  FINDINGS: Lung volumes and mediastinal contours appear stable since last year and within normal limits. Visualized tracheal air column is within normal limits. Both lungs appear clear. No acute osseous abnormality identified. Stable cholecystectomy clips. Negative visible bowel gas. IMPRESSION: No acute cardiopulmonary abnormality. Electronically Signed   By: Genevie Ann M.D.   On: 05/17/2022 10:40         Latest Ref Rng & Units 01/24/2022    3:54 PM  PFT Results  FVC-Pre L 2.64   FVC-Predicted Pre % 91   FVC-Post L 2.74   FVC-Predicted Post % 95   Pre FEV1/FVC % % 72   Post FEV1/FCV % % 77   FEV1-Pre L 1.90   FEV1-Predicted Pre % 87   FEV1-Post L 2.10   DLCO uncorrected ml/min/mmHg 18.05   DLCO UNC% % 95   DLCO corrected ml/min/mmHg 18.34   DLCO COR %Predicted % 97   DLVA Predicted % 99   TLC L 4.75   TLC % Predicted % 96   RV % Predicted % 104     No results found for: "NITRICOXIDE"      Assessment & Plan:   Poorly controlled persistent asthma History of childhood asthma; she had borderline BD response on PFTs and significant midflow reversibility. Initially benefited from [place] but now flaring again. Will treat today for exacerbation with acute bronchitis and elevated exhaled nitric oxide with prednisone burst. Check CXR to rule out superimposed infection and determine appropriateness of abx therapy. Continue triple therapy and PRN albuterol. We reviewed biologic therapy, which I think she would greatly benefit from. She is agreeable to this plan. We will check allergen panel and eosinophils today, and reach out to pharmacy team to start the process.   Patient Instructions  Continue Breztri 2 puffs Twice daily. Brush tongue and rinse mouth afterwards Continue Albuterol inhaler 2 puffs or 3 mL neb every 6 hours as needed for shortness of breath or wheezing. Notify if symptoms persist despite rescue inhaler/neb use. Continue  omeprazole 40 mg daily before breakfast  Continue pepcid 1 tab At bedtime  Continue allegra 180 mg daily for allergies   Prednisone 40 mg daily for 5 days. Take in AM with food Mucinex DM Twice daily for cough/congestion Chlorpheniramine tab 4 mg At bedtime for cough  Labs today - allergen panel, CBC with diff  Chest x ray.   We will start the process for biologic therapy for your asthma and will be in touch after we get labs back.  Follow up in 2 weeks with Dr. Valeta Harms or Alanson Aly. If symptoms do not improve or worsen, please contact office for sooner follow up or seek emergency care.     I spent 35 minutes of dedicated to the care of this patient on the date of this encounter to include pre-visit review of records, face-to-face time with the patient discussing conditions above, post visit ordering of testing, clinical documentation with the electronic health record, making appropriate referrals as documented, and communicating necessary findings to members of the patients care team.  Clayton Bibles, NP 05/17/2022  Pt aware and understands NP's role.

## 2022-05-17 NOTE — Assessment & Plan Note (Addendum)
History of childhood asthma; she had borderline BD response on PFTs and significant midflow reversibility. Initially benefited from [place] but now flaring again. Will treat today for exacerbation with acute bronchitis and elevated exhaled nitric oxide with prednisone burst. Check CXR to rule out superimposed infection and determine appropriateness of abx therapy. Continue triple therapy and PRN albuterol. We reviewed biologic therapy, which I think she would greatly benefit from. She is agreeable to this plan. We will check allergen panel and eosinophils today, and reach out to pharmacy team to start the process.   Patient Instructions  Continue Breztri 2 puffs Twice daily. Brush tongue and rinse mouth afterwards Continue Albuterol inhaler 2 puffs or 3 mL neb every 6 hours as needed for shortness of breath or wheezing. Notify if symptoms persist despite rescue inhaler/neb use. Continue omeprazole 40 mg daily before breakfast  Continue pepcid 1 tab At bedtime  Continue allegra 180 mg daily for allergies   Prednisone 40 mg daily for 5 days. Take in AM with food Mucinex DM Twice daily for cough/congestion Chlorpheniramine tab 4 mg At bedtime for cough  Labs today - allergen panel, CBC with diff  Chest x ray.   We will start the process for biologic therapy for your asthma and will be in touch after we get labs back.  Follow up in 2 weeks with Dr. Valeta Harms or Alanson Aly. If symptoms do not improve or worsen, please contact office for sooner follow up or seek emergency care.

## 2022-05-17 NOTE — Progress Notes (Signed)
Please notify patient that her eosinophils were elevated to 400 so we will move forward with biologic therapy for her asthma, if she is still okay with this. If so, please send message to pharmacy team to start patient on [place]. Thanks!

## 2022-05-21 LAB — ALLERGEN PANEL (27) + IGE
Alternaria Alternata IgE: <0.1 kU/L
Aspergillus Fumigatus IgE: <0.1 kU/L
Bahia Grass IgE: 2.94 kU/L — AB
Bermuda Grass IgE: 1.43 kU/L — AB
Cat Dander IgE: 0.43 kU/L — AB
Cedar, Mountain IgE: 0.43 kU/L — AB
Cladosporium Herbarum IgE: <0.1 kU/L
Cocklebur IgE: 0.71 kU/L — AB
Cockroach, American IgE: <0.1 kU/L
Common Silver Birch IgE: 0.38 kU/L — AB
D Farinae IgE: 8.43 kU/L — AB
D Pteronyssinus IgE: 7.1 kU/L — AB
Dog Dander IgE: 0.14 kU/L — AB
Elm, American IgE: 0.2 kU/L — AB
Hickory, White IgE: 0.75 kU/L — AB
IgE (Immunoglobulin E), Serum: 638 IU/mL — ABNORMAL HIGH (ref 6–495)
Johnson Grass IgE: 2.21 kU/L — AB
Kentucky Bluegrass IgE: 4.57 kU/L — AB
Maple/Box Elder IgE: 0.18 kU/L — AB
Mucor Racemosus IgE: <0.1 kU/L
Oak, White IgE: 0.31 kU/L — AB
Penicillium Chrysogen IgE: <0.1 kU/L
Pigweed, Rough IgE: 0.14 kU/L — AB
Plantain, English IgE: 0.15 kU/L — AB
Ragweed, Short IgE: 0.54 kU/L — AB
Setomelanomma Rostrat: <0.1 kU/L
Timothy Grass IgE: 3.42 kU/L — AB
White Mulberry IgE: <0.1 kU/L

## 2022-05-23 ENCOUNTER — Other Ambulatory Visit: Payer: Self-pay | Admitting: Nurse Practitioner

## 2022-05-23 DIAGNOSIS — J3089 Other allergic rhinitis: Secondary | ICD-10-CM

## 2022-05-23 NOTE — Progress Notes (Signed)
Please notify patient that she has multiple environmental allergies including dust, cat and dog dander, and multiple grasses/trees. Her immunoglobulin E is also elevated. Asthma is allergic in nature it seems. I am going to refer her to an allergist, if she is okay with this. This does not change our plans; we will still continue to move forward with starting her on [place] and keep her on Breztri. This is to ensure that her allergies are better controlled/managed. Thanks!

## 2022-05-24 DIAGNOSIS — M0579 Rheumatoid arthritis with rheumatoid factor of multiple sites without organ or systems involvement: Secondary | ICD-10-CM | POA: Diagnosis not present

## 2022-05-24 DIAGNOSIS — Z79899 Other long term (current) drug therapy: Secondary | ICD-10-CM | POA: Diagnosis not present

## 2022-05-25 ENCOUNTER — Telehealth: Payer: Self-pay

## 2022-05-25 DIAGNOSIS — G729 Myopathy, unspecified: Secondary | ICD-10-CM | POA: Diagnosis not present

## 2022-05-25 DIAGNOSIS — E785 Hyperlipidemia, unspecified: Secondary | ICD-10-CM | POA: Diagnosis not present

## 2022-05-25 DIAGNOSIS — R7989 Other specified abnormal findings of blood chemistry: Secondary | ICD-10-CM | POA: Diagnosis not present

## 2022-05-25 DIAGNOSIS — F419 Anxiety disorder, unspecified: Secondary | ICD-10-CM | POA: Diagnosis not present

## 2022-05-25 DIAGNOSIS — E559 Vitamin D deficiency, unspecified: Secondary | ICD-10-CM | POA: Diagnosis not present

## 2022-05-25 DIAGNOSIS — I10 Essential (primary) hypertension: Secondary | ICD-10-CM | POA: Diagnosis not present

## 2022-05-25 NOTE — Telephone Encounter (Signed)
Received New start paperwork for [place]. Will update as we work through the benefits process.  Submitted a Prior Authorization request to  [employer]  for DUPIXENT via CoverMyMeds. Will update once we receive a response.   Key: [id number]

## 2022-05-29 ENCOUNTER — Other Ambulatory Visit (HOSPITAL_COMMUNITY): Payer: Self-pay

## 2022-05-29 NOTE — Telephone Encounter (Signed)
Submitted Patient Assistance Application to [place] for [place] along with provider portion, patient portion, PA approval letter, insurance card copy, and med list. Will update patient when we receive a response.  Fax# [phone] Phone# [phone], option 1, option #  Knox Saliva, PharmD, MPH, BCPS, CPP Clinical Pharmacist (Rheumatology and Pulmonology)

## 2022-05-29 NOTE — Telephone Encounter (Signed)
Received notification from  [place]  regarding a prior authorization for [place]. Authorization has been APPROVED from 05/28/2022 to 11/28/2022.   Per test claim, copay for 28 days supply is $875.69  Patient can fill through [place]: [phone]   Authorization # [id number]   Will await approval letter and compile remaining paperwork needed for PAP submission.

## 2022-05-30 ENCOUNTER — Encounter: Payer: Self-pay | Admitting: Pulmonary Disease

## 2022-05-30 ENCOUNTER — Ambulatory Visit: Payer: PPO | Admitting: Pulmonary Disease

## 2022-05-30 VITALS — BP 136/64 | HR 94 | Temp 98.9°F | Ht 63.0 in | Wt 153.8 lb

## 2022-05-30 DIAGNOSIS — J4551 Severe persistent asthma with (acute) exacerbation: Secondary | ICD-10-CM

## 2022-05-30 DIAGNOSIS — T7849XA Other allergy, initial encounter: Secondary | ICD-10-CM | POA: Diagnosis not present

## 2022-05-30 DIAGNOSIS — R768 Other specified abnormal immunological findings in serum: Secondary | ICD-10-CM | POA: Diagnosis not present

## 2022-05-30 MED ORDER — PREDNISONE 5 MG PO TABS: 5.0000 mg | ORAL_TABLET | Freq: Every day | ORAL | 0 refills | Status: AC

## 2022-05-30 NOTE — Progress Notes (Signed)
Synopsis: Referred in Feb 2023 for chest pains  by Velna Hatchet, MD  Subjective:   PATIENT ID: Courtney Crane GENDER: female DOB: 22-Jul-1951, MRN: [id number]  Chief Complaint  Patient presents with   Follow-up    Pt is here for follow up for her persistent asthma. Pt states she is doing better since last visit. Pt is still pending for dupixent at this time. Pt is on Breztri and she states that she is doing well with it. Pt states she is unable to judge if the inhaler is working due to her still having SOB and winded at times.     PMH HTN, HLD, IBS, diverticulitis, Rheumatoid Arthritis, OA. She on infliximab q6 weeks. On meloxicam. New dx of RA as of 2018.  Patient had a recent ER visit for evaluation.  CTA of the chest completed which was negative for PE.  From respiratory standpoint she still has shortness of breath with exertion.  She occasionally has flareups of her RA still on infliximab.  Currently using meloxicam for pain control.  We reviewed her CT findings today in the office.  Her shortness of breath is predominantly associated with exertion.  She also noticed when it is hot and humid outside occasionally she feels short of breath.  OV 03/28/2022: Here today for follow-up.  She had PFTs completed as well as HRCT imaging.  HRCT imaging was reassuring with small nodule still persistent however no evidence of interstitial lung disease or ILD related to RA.  Also her lung function was normal we reviewed this PFT today in the office with patient and patient's husband present today.  She still has cough and shortness of breath.  She did have asthma as a child she does feel like her cough is still there and take some time for it to clear.  She has been on prednisone in the past but not really sure how she was breathing when she was on it mainly this was due to her RA at the time.  She also thinks that her albuterol has been helping and she when she uses her albuterol she is able to complete  her exercises and walk outside better.  OV 05/30/2022: Here today for follow-up.  Was recently seen in the office with a repeat exacerbation.  Started on prednisone she breathes much better when she is on prednisone.  Felt that were probably dealing with more of an atopic bronchitis type picture.  Likely dealing with severe asthma.Patient had recent lab work with 5.8% eosinophils, eosinophil count greater than 400.  Significantly positive RAST panel and an IgE of 638.  She was treated with prednisone and she feels much better when she is on it.  She has been using her Breztri daily as well as as needed albuterol.  She had her Dupixent approved but she has not yet started.    Past Medical History:  Diagnosis Date   Anemia    Anxiety    Arthritis    Asthma    Cataract    Colon polyps    Complication of anesthesia    "sometimes I dont like to wake up after " ie difficult to awaken    Diverticulitis    Diverticulosis    Fatty liver    GERD (gastroesophageal reflux disease)    HLD (hyperlipidemia)    HTN (hypertension)    IBS (irritable bowel syndrome)    Insomnia    MRSA infection    Osteoarthritis    Osteopenia  PONV (postoperative nausea and vomiting)    RA (rheumatoid arthritis) (HCC)    Rheumatoid arthritis (HCC)    Seasonal allergies    Shingles    Status post dilation of esophageal narrowing    Vitamin D deficiency      Family History  Problem Relation Age of Onset   Colon polyps Mother    Diabetes Mother    Heart disease Mother    Kidney cancer Mother    Arthritis Mother    Hypertension Mother    Hyperlipidemia Mother    Colon polyps Son    CAD Brother    Esophageal cancer Brother    Dementia Father    Colon cancer Neg Hx    Stomach cancer Neg Hx    Rectal cancer Neg Hx    Prostate cancer Neg Hx      Past Surgical History:  Procedure Laterality Date   APPENDECTOMY  1975   bladder tack  1990   CATARACT EXTRACTION, BILATERAL Bilateral    CHOLECYSTECTOMY  N/A 01/31/2017   Procedure: LAPAROSCOPIC CHOLECYSTECTOMY WITH INTRAOPERATIVE CHOLANGIOGRAM;  Surgeon: Armandina Gemma, MD;  Location: [facility];  Service: General;  Laterality: N/A;   INCISION AND DRAINAGE PERIRECTAL ABSCESS N/A 12/02/2017   Procedure: IRRIGATION AND DEBRIDEMENT PERINEAL ABSCESS;  Surgeon: Greer Pickerel, MD;  [facility]: [facility];  Service: General;  Laterality: N/A;   MENISCUS REPAIR Right 2013   ROTATOR CUFF REPAIR Right 2005   TONSILLECTOMY     as a child   TOTAL ABDOMINAL HYSTERECTOMY  1988   TOTAL KNEE ARTHROPLASTY Left    WRIST RECONSTRUCTION Right 2008    Social History   Socioeconomic History   Marital status: Married    Spouse name: Not on file   Number of children: 2   Years of education: Not on file   Highest education level: Not on file  Occupational History   Occupation: retired  Tobacco Use   Smoking status: Never   Smokeless tobacco: Never  Vaping Use   Vaping Use: Never used  Substance and Sexual Activity   Alcohol use: Yes    Alcohol/week: 7.0 standard drinks of alcohol    Types: 7 Standard drinks or equivalent per week    Comment: wine daily   Drug use: No   Sexual activity: Yes    Birth control/protection: None  Other Topics Concern   Not on file  Social History Narrative   Retired from [employer]   Lives at home with husband ; one level home   Right handed   Highest level of education:  Some college   Social Determinants of Health   Financial Resource Strain: Not on file  Food Insecurity: Not on file  Transportation Needs: Not on file  Physical Activity: Not on file  Stress: Not on file  Social Connections: Not on file  Intimate Partner Violence: Not on file     Allergies  Allergen Reactions   Methotrexate Other (See Comments)   Demerol [Meperidine] Nausea Only   Nickel Rash   Tape Rash and Other (See Comments)     Outpatient Medications Prior to Visit  Medication Sig Dispense Refill   acetaminophen (TYLENOL) 500 MG tablet Take 1,000  mg by mouth every 6 (six) hours as needed for moderate pain.     albuterol (VENTOLIN HFA) 108 (90 Base) MCG/ACT inhaler Inhale 2 puffs into the lungs every 6 (six) hours as needed for wheezing or shortness of breath. 8 g 6   amLODipine (NORVASC) 10 MG  tablet Take 10 mg by mouth daily.      ascorbic acid (VITAMIN C) 1000 MG tablet Take 1,000 mg by mouth daily.     aspirin EC 81 MG tablet Take 81 mg by mouth daily. Swallow whole.     Budeson-Glycopyrrol-Formoterol (BREZTRI AEROSPHERE) 160-9-4.8 MCG/ACT AERO Inhale 2 puffs into the lungs in the morning and at bedtime. 10.7 g 1   Budeson-Glycopyrrol-Formoterol (BREZTRI AEROSPHERE) 160-9-4.8 MCG/ACT AERO Inhale 2 puffs into the lungs in the morning and at bedtime. 10.7 g 0   CANNABIDIOL PO Take 2 capsules by mouth at bedtime.     Cholecalciferol (VITAMIN D-3) 125 MCG (5000 UT) TABS Take 1 tablet by mouth daily.     famotidine (PEPCID) 40 MG tablet Take 1 tablet (40 mg total) by mouth at bedtime. 90 tablet 3   fexofenadine (ALLEGRA) 180 MG tablet Take 180 mg by mouth daily as needed for allergies.     hydrochlorothiazide (HYDRODIURIL) 25 MG tablet Take 25 mg by mouth daily.     hydrocortisone-pramoxine (ANALPRAM-HC) 2.5-1 % rectal cream PLACE 1 APPLICATION RECTALLY 3 (THREE) TIMES DAILY. (DRUG NOT COVERED)--STOCK SIZE 120 g 0   inFLIXimab-abda (RENFLEXIS) 100 MG SOLR Inject 1 each into the vein every 8 (eight) weeks.     leflunomide (ARAVA) 20 MG tablet 1 tablet     losartan (COZAAR) 100 MG tablet Take 100 mg by mouth daily.     Magnesium 250 MG TABS Take by mouth.     melatonin 1 MG TABS tablet Take 1 mg by mouth as needed.     metoprolol succinate (TOPROL-XL) 25 MG 24 hr tablet TAKE 0.5 TABLETS BY MOUTH DAILY. TAKE WITH OR IMMEDIATELY FOLLOWING A MEAL. 45 tablet 1   mirabegron ER (MYRBETRIQ) 25 MG TB24 tablet Take 1 tablet (25 mg total) by mouth daily. 30 tablet 5   Multiple Vitamin (MULTIVITAMIN) tablet Take 1 tablet by mouth daily.     Omega-3  Fatty Acids (FISH OIL) 1200 MG CAPS Take 1,200 mg by mouth 2 (two) times daily.     omeprazole (PRILOSEC) 40 MG capsule TAKE 1 CAPSULE BY MOUTH DAILY BEFORE BREAKFAST. TAKE ONE PILL 20-30 MINUTES BEFORE BREAKFAST MEAL. 90 capsule 3   polycarbophil (FIBERCON) 625 MG tablet Take 625 mg by mouth daily.     polyethylene glycol (MIRALAX / GLYCOLAX) 17 g packet Take 17 g by mouth every Monday, Wednesday, and Friday.     rosuvastatin (CRESTOR) 5 MG tablet Take 5 mg by mouth daily.     valACYclovir (VALTREX) 500 MG tablet Take 500 mg by mouth daily as needed.     meloxicam (MOBIC) 15 MG tablet Take 15 mg by mouth daily.     No facility-administered medications prior to visit.    Review of Systems  Constitutional:  Negative for chills, fever, malaise/fatigue and weight loss.  HENT:  Negative for hearing loss, sore throat and tinnitus.   Eyes:  Negative for blurred vision and double vision.  Respiratory:  Positive for cough, shortness of breath and wheezing. Negative for hemoptysis, sputum production and stridor.   Cardiovascular:  Negative for chest pain, palpitations, orthopnea, leg swelling and PND.  Gastrointestinal:  Negative for abdominal pain, constipation, diarrhea, heartburn, nausea and vomiting.  Genitourinary:  Negative for dysuria, hematuria and urgency.  Musculoskeletal:  Negative for joint pain and myalgias.  Skin:  Negative for itching and rash.  Neurological:  Negative for dizziness, tingling, weakness and headaches.  Endo/Heme/Allergies:  Negative for environmental allergies. Does not  bruise/bleed easily.  Psychiatric/Behavioral:  Negative for depression. The patient is not nervous/anxious and does not have insomnia.   All other systems reviewed and are negative.    Objective:  Physical Exam Vitals reviewed.  Constitutional:      General: She is not in acute distress.    Appearance: She is well-developed.  HENT:     Head: Normocephalic and atraumatic.  Eyes:     General:  No scleral icterus.    Conjunctiva/sclera: Conjunctivae normal.     Pupils: Pupils are equal, round, and reactive to light.  Neck:     Vascular: No JVD.     Trachea: No tracheal deviation.  Cardiovascular:     Rate and Rhythm: Normal rate and regular rhythm.     Heart sounds: No murmur heard. Pulmonary:     Effort: Pulmonary effort is normal. No tachypnea, accessory muscle usage or respiratory distress.     Breath sounds: No stridor. No wheezing, rhonchi or rales.     Comments: Chest tightness, wheezing Abdominal:     General: There is no distension.     Palpations: Abdomen is soft.     Tenderness: There is no abdominal tenderness.  Musculoskeletal:        General: No tenderness.     Cervical back: Neck supple.  Lymphadenopathy:     Cervical: No cervical adenopathy.  Skin:    General: Skin is warm and dry.     Capillary Refill: Capillary refill takes less than 2 seconds.     Findings: No rash.  Neurological:     Mental Status: She is alert and oriented to person, place, and time.  Psychiatric:        Behavior: Behavior normal.      Vitals:   05/30/22 1203  BP: 136/64  Pulse: 94  Temp: 98.9 F (37.2 C)  TempSrc: Oral  SpO2: 95%  Weight: 153 lb 12.8 oz (69.8 kg)  Height: '5\' 3"'$  (1.6 m)   95% on  LPM  RA BMI Readings from Last 3 Encounters:  05/30/22 27.24 kg/m  05/17/22 28.75 kg/m  03/28/22 29.92 kg/m   Wt Readings from Last 3 Encounters:  05/30/22 153 lb 12.8 oz (69.8 kg)  05/17/22 157 lb 3.2 oz (71.3 kg)  03/28/22 163 lb 9.6 oz (74.2 kg)     CBC    Component Value Date/Time   WBC 6.5 05/17/2022 1016   RBC 4.29 05/17/2022 1016   HGB 13.0 05/17/2022 1016   HCT 39.2 05/17/2022 1016   PLT 173.0 05/17/2022 1016   MCV 91.4 05/17/2022 1016   MCH 31.0 08/31/2021 1352   MCHC 33.2 05/17/2022 1016   RDW 15.0 05/17/2022 1016   LYMPHSABS 2.1 05/17/2022 1016   MONOABS 1.0 05/17/2022 1016   EOSABS 0.4 05/17/2022 1016   BASOSABS 0.1 05/17/2022 1016       Chest Imaging: CT chest October 2022: No PE, there is some subtle parenchymal changes, possibly related to atelectasis but there is an obvious difference there on her CT imaging. The patient's images have been independently reviewed by me.    HRCT chest 01/26/2022: No evidence of interstitial lung disease, lung nodules stable, thyroid nodule. The patient's images have been independently reviewed by me.     Pulmonary Functions Testing Results:    Latest Ref Rng & Units 01/24/2022    3:54 PM  PFT Results  FVC-Pre L 2.64   FVC-Predicted Pre % 91   FVC-Post L 2.74   FVC-Predicted Post % 95  Pre FEV1/FVC % % 72   Post FEV1/FCV % % 77   FEV1-Pre L 1.90   FEV1-Predicted Pre % 87   FEV1-Post L 2.10   DLCO uncorrected ml/min/mmHg 18.05   DLCO UNC% % 95   DLCO corrected ml/min/mmHg 18.34   DLCO COR %Predicted % 97   DLVA Predicted % 99   TLC L 4.75   TLC % Predicted % 96   RV % Predicted % 104    FeNO:   Pathology:   Echocardiogram:   Heart Catheterization:     Assessment & Plan:     ICD-10-CM   1. Severe persistent asthma with acute exacerbation  J45.51     2. Positive RAST testing  T78.49XA     3. Elevated IgE level  R76.8        Discussion:  This is a 71 year old female, on infliximab infusions for rheumatoid arthritis, HRCT with no evidence of ILD, PFTs normal.  She does have significant recurrent shortness of breath chest tightness wheezing and cough.  Positive RAST panel and elevated IgE.  I think she has severe persistent asthma that has had increased symptoms despite triple therapy inhaler regimen and rounds of prednisone.  She does much better on prednisone therapy  Plan: She has been approved for Dupixent but has not yet started Continue Breztri daily Continue albuterol as needed daily Would at least try to schedule her albuterol 2 to 2-3 times a day with her nebulizer. Start prednisone 5 mg oral daily I like to leave her on 5 mg of prednisone  until she at least starts her Dupixent and when she starts her Dupixent she can break the remaining tablets in half and take 2.5 mg to give her a bridge until her Dupixent kicks in.  Follow-up with [place] in 3 months, Can see me or Roxan Diesel, NP.   Current Outpatient Medications:    acetaminophen (TYLENOL) 500 MG tablet, Take 1,000 mg by mouth every 6 (six) hours as needed for moderate pain., Disp: , Rfl:    albuterol (VENTOLIN HFA) 108 (90 Base) MCG/ACT inhaler, Inhale 2 puffs into the lungs every 6 (six) hours as needed for wheezing or shortness of breath., Disp: 8 g, Rfl: 6   amLODipine (NORVASC) 10 MG tablet, Take 10 mg by mouth daily. , Disp: , Rfl:    ascorbic acid (VITAMIN C) 1000 MG tablet, Take 1,000 mg by mouth daily., Disp: , Rfl:    aspirin EC 81 MG tablet, Take 81 mg by mouth daily. Swallow whole., Disp: , Rfl:    Budeson-Glycopyrrol-Formoterol (BREZTRI AEROSPHERE) 160-9-4.8 MCG/ACT AERO, Inhale 2 puffs into the lungs in the morning and at bedtime., Disp: 10.7 g, Rfl: 1   Budeson-Glycopyrrol-Formoterol (BREZTRI AEROSPHERE) 160-9-4.8 MCG/ACT AERO, Inhale 2 puffs into the lungs in the morning and at bedtime., Disp: 10.7 g, Rfl: 0   CANNABIDIOL PO, Take 2 capsules by mouth at bedtime., Disp: , Rfl:    Cholecalciferol (VITAMIN D-3) 125 MCG (5000 UT) TABS, Take 1 tablet by mouth daily., Disp: , Rfl:    famotidine (PEPCID) 40 MG tablet, Take 1 tablet (40 mg total) by mouth at bedtime., Disp: 90 tablet, Rfl: 3   fexofenadine (ALLEGRA) 180 MG tablet, Take 180 mg by mouth daily as needed for allergies., Disp: , Rfl:    hydrochlorothiazide (HYDRODIURIL) 25 MG tablet, Take 25 mg by mouth daily., Disp: , Rfl:    hydrocortisone-pramoxine (ANALPRAM-HC) 2.5-1 % rectal cream, PLACE 1 APPLICATION RECTALLY 3 (THREE) TIMES DAILY. (DRUG  NOT COVERED)--STOCK SIZE, Disp: 120 g, Rfl: 0   inFLIXimab-abda (RENFLEXIS) 100 MG SOLR, Inject 1 each into the vein every 8 (eight) weeks., Disp: , Rfl:    leflunomide  (ARAVA) 20 MG tablet, 1 tablet, Disp: , Rfl:    losartan (COZAAR) 100 MG tablet, Take 100 mg by mouth daily., Disp: , Rfl:    Magnesium 250 MG TABS, Take by mouth., Disp: , Rfl:    melatonin 1 MG TABS tablet, Take 1 mg by mouth as needed., Disp: , Rfl:    metoprolol succinate (TOPROL-XL) 25 MG 24 hr tablet, TAKE 0.5 TABLETS BY MOUTH DAILY. TAKE WITH OR IMMEDIATELY FOLLOWING A MEAL., Disp: 45 tablet, Rfl: 1   mirabegron ER (MYRBETRIQ) 25 MG TB24 tablet, Take 1 tablet (25 mg total) by mouth daily., Disp: 30 tablet, Rfl: 5   Multiple Vitamin (MULTIVITAMIN) tablet, Take 1 tablet by mouth daily., Disp: , Rfl:    Omega-3 Fatty Acids (FISH OIL) 1200 MG CAPS, Take 1,200 mg by mouth 2 (two) times daily., Disp: , Rfl:    omeprazole (PRILOSEC) 40 MG capsule, TAKE 1 CAPSULE BY MOUTH DAILY BEFORE BREAKFAST. TAKE ONE PILL 20-30 MINUTES BEFORE BREAKFAST MEAL., Disp: 90 capsule, Rfl: 3   polycarbophil (FIBERCON) 625 MG tablet, Take 625 mg by mouth daily., Disp: , Rfl:    polyethylene glycol (MIRALAX / GLYCOLAX) 17 g packet, Take 17 g by mouth every Monday, Wednesday, and Friday., Disp: , Rfl:    predniSONE (DELTASONE) 5 MG tablet, Take 1 tablet (5 mg total) by mouth daily with breakfast., Disp: 60 tablet, Rfl: 0   rosuvastatin (CRESTOR) 5 MG tablet, Take 5 mg by mouth daily., Disp: , Rfl:    valACYclovir (VALTREX) 500 MG tablet, Take 500 mg by mouth daily as needed., Disp: , Rfl:    Garner Nash, DO  Pulmonary Critical Care 05/30/2022 12:22 PM

## 2022-05-30 NOTE — Patient Instructions (Signed)
Thank you for visiting Dr. Valeta Harms at [facility]. Today we recommend the following:  Meds ordered this encounter  Medications   predniSONE (DELTASONE) 5 MG tablet    Sig: Take 1 tablet (5 mg total) by mouth daily with breakfast.    Dispense:  60 tablet    Refill:  0   Keep breztri going  Try using nebulizer at least twice daily   Return in about 3 months (around 08/30/2022) for Derl Barrow, NP or Dr. Valeta Harms .    Please do your part to reduce the spread of COVID-19.

## 2022-06-01 ENCOUNTER — Other Ambulatory Visit: Payer: Self-pay | Admitting: Internal Medicine

## 2022-06-01 DIAGNOSIS — R739 Hyperglycemia, unspecified: Secondary | ICD-10-CM | POA: Diagnosis not present

## 2022-06-01 DIAGNOSIS — F330 Major depressive disorder, recurrent, mild: Secondary | ICD-10-CM | POA: Diagnosis not present

## 2022-06-01 DIAGNOSIS — I10 Essential (primary) hypertension: Secondary | ICD-10-CM | POA: Diagnosis not present

## 2022-06-01 DIAGNOSIS — Z0000 Encounter for general adult medical examination without abnormal findings: Secondary | ICD-10-CM | POA: Diagnosis not present

## 2022-06-01 DIAGNOSIS — E041 Nontoxic single thyroid nodule: Secondary | ICD-10-CM

## 2022-06-01 DIAGNOSIS — M069 Rheumatoid arthritis, unspecified: Secondary | ICD-10-CM | POA: Diagnosis not present

## 2022-06-01 DIAGNOSIS — M8580 Other specified disorders of bone density and structure, unspecified site: Secondary | ICD-10-CM | POA: Diagnosis not present

## 2022-06-01 DIAGNOSIS — E785 Hyperlipidemia, unspecified: Secondary | ICD-10-CM | POA: Diagnosis not present

## 2022-06-01 DIAGNOSIS — Z1339 Encounter for screening examination for other mental health and behavioral disorders: Secondary | ICD-10-CM | POA: Diagnosis not present

## 2022-06-01 DIAGNOSIS — E1169 Type 2 diabetes mellitus with other specified complication: Secondary | ICD-10-CM | POA: Diagnosis not present

## 2022-06-01 DIAGNOSIS — J45909 Unspecified asthma, uncomplicated: Secondary | ICD-10-CM | POA: Diagnosis not present

## 2022-06-01 DIAGNOSIS — Z1331 Encounter for screening for depression: Secondary | ICD-10-CM | POA: Diagnosis not present

## 2022-06-01 DIAGNOSIS — D509 Iron deficiency anemia, unspecified: Secondary | ICD-10-CM | POA: Diagnosis not present

## 2022-06-04 ENCOUNTER — Ambulatory Visit
Admission: RE | Admit: 2022-06-04 | Discharge: 2022-06-04 | Disposition: A | Discharge status: Home / Self Care | Payer: PPO | Source: Ambulatory Visit | Attending: Internal Medicine | Admitting: Internal Medicine

## 2022-06-04 DIAGNOSIS — E041 Nontoxic single thyroid nodule: Secondary | ICD-10-CM

## 2022-06-06 ENCOUNTER — Encounter: Payer: Self-pay | Admitting: Radiology

## 2022-06-06 ENCOUNTER — Ambulatory Visit (INDEPENDENT_AMBULATORY_CARE_PROVIDER_SITE_OTHER): Payer: PPO | Admitting: Radiology

## 2022-06-06 VITALS — BP 112/58 | Ht 62.5 in | Wt 153.0 lb

## 2022-06-06 DIAGNOSIS — Z9189 Other specified personal risk factors, not elsewhere classified: Secondary | ICD-10-CM

## 2022-06-06 DIAGNOSIS — N8110 Cystocele, unspecified: Secondary | ICD-10-CM

## 2022-06-06 DIAGNOSIS — B009 Herpesviral infection, unspecified: Secondary | ICD-10-CM

## 2022-06-06 DIAGNOSIS — K644 Residual hemorrhoidal skin tags: Secondary | ICD-10-CM | POA: Diagnosis not present

## 2022-06-06 DIAGNOSIS — Z01419 Encounter for gynecological examination (general) (routine) without abnormal findings: Secondary | ICD-10-CM

## 2022-06-06 DIAGNOSIS — K648 Other hemorrhoids: Secondary | ICD-10-CM | POA: Diagnosis not present

## 2022-06-06 NOTE — Progress Notes (Signed)
   Courtney Crane 1951/03/26 [id number]   History: Postmenopausal 71 y.o. presents for annual exam, previous patient at [facility]. Doing well, s/p Total hyst. Has a gellhorn pessary for cystocele, removed at home before coming in.   Gynecologic History Postmenopausal Last Pap: 2020. Results were: normal Last mammogram: 7/22. Results were: normal Last colonoscopy: 2021 HRT use: no  Obstetric History OB History  Gravida Para Term Preterm AB Living  '4       1 2  '$ SAB IAB Ectopic Multiple Live Births  1       2    # Outcome Date GA Lbr Len/2nd Weight Sex Delivery Anes PTL Lv  4 Gravida           3 Gravida           2 Gravida           1 SAB              The following portions of the patient's history were reviewed and updated as appropriate: allergies, current medications, past family history, past medical history, past social history, past surgical history, and problem list.  Review of Systems Pertinent items noted in HPI and remainder of comprehensive ROS otherwise negative.  Past medical history, past surgical history, family history and social history were all reviewed and documented in the EPIC chart.  Exam:  Vitals:   06/06/22 1438  BP: (!) 112/58  Weight: 153 lb (69.4 kg)  Height: 5' 2.5" (1.588 m)   Body mass index is 27.54 kg/m.  General appearance:  Normal Thyroid:  Symmetrical, normal in size, without palpable masses or nodularity. Respiratory  Auscultation:  Clear without wheezing or rhonchi Cardiovascular  Auscultation:  Regular rate, without rubs, murmurs or gallops  Edema/varicosities:  Not grossly evident Abdominal  Soft,nontender, without masses, guarding or rebound.  Liver/spleen:  No organomegaly noted  Hernia:  None appreciated  Skin  Inspection:  Grossly normal Breasts: Examined lying and sitting.   Right: Without masses, retractions, nipple discharge or axillary adenopathy.   Left: Without masses, retractions, nipple discharge or axillary  adenopathy. Genitourinary   Inguinal/mons:  Normal without inguinal adenopathy  External genitalia:  Normal appearing vulva with no masses, tenderness, or lesions  BUS/Urethra/Skene's glands:  Normal  Vagina:  Normal appearing with normal color and discharge, no lesions. Atrophy: moderate. Cystocele, stage 2  Cervix:  absent  Uterus:  absent  Adnexa/parametria:  absent  Anus and perineum: Normal    Patient informed chaperone available to be present for breast and pelvic exam. Patient has requested no chaperone to be present. Patient has been advised what will be completed during breast and pelvic exam.   Assessment/Plan:   1. Encounter for breast and pelvic examination Schedule mammogram Up to date otherwise on screenings  Discussed SBE, colonoscopy and DEXA screening as directed. Recommend 13mns of exercise weekly, including weight bearing exercise. Encouraged the use of seatbelts and sunscreen.  Return in 1 year for annual or sooner prn.  Allyanna Appleman B WHNP-BC, 3:12 PM 06/06/2022

## 2022-06-09 ENCOUNTER — Other Ambulatory Visit: Payer: Self-pay | Admitting: Pulmonary Disease

## 2022-06-18 ENCOUNTER — Encounter: Payer: Self-pay | Admitting: Pulmonary Disease

## 2022-06-18 NOTE — Telephone Encounter (Signed)
Please advise on pt's message regarding her Dupixent. Thanks.

## 2022-06-19 NOTE — Telephone Encounter (Signed)
Reached out to [employer] and learned that pt has been tentatively approved, pending outreach from her. Notified her via MyChart message of next steps (refer to separate 7/31 encounter) and requested she reach out to [place] with any issues or additional questions she may have. Will await approval letter from [employer].

## 2022-06-20 NOTE — Telephone Encounter (Signed)
Left VM on patient's home phone to schedule Dupixent new start. Will f/u  Knox Saliva, PharmD, MPH, BCPS, CPP Clinical Pharmacist (Rheumatology and Pulmonology)

## 2022-06-20 NOTE — Telephone Encounter (Signed)
Received a fax from  [place] regarding an approval for [place] patient assistance from 06/20/2022 to 11/18/2022. Approval letter sent to scan center.  Ref# [id number]

## 2022-06-21 NOTE — Telephone Encounter (Signed)
Patient scheduled for Dupixent new start on 07/04/22. Will use sample.  Patient completed initial call with [place] regarding pt assistance approval. She has not yet heard from [facility] but has been advised to ensure she gets shipment by 07/18/22.  Knox Saliva, PharmD, MPH, BCPS, CPP Clinical Pharmacist (Rheumatology and Pulmonology)

## 2022-06-25 ENCOUNTER — Other Ambulatory Visit: Payer: Self-pay | Admitting: Obstetrics and Gynecology

## 2022-06-25 DIAGNOSIS — N3281 Overactive bladder: Secondary | ICD-10-CM

## 2022-07-04 ENCOUNTER — Ambulatory Visit: Payer: PPO | Admitting: Pharmacist

## 2022-07-04 DIAGNOSIS — Z7189 Other specified counseling: Secondary | ICD-10-CM

## 2022-07-04 DIAGNOSIS — J4551 Severe persistent asthma with (acute) exacerbation: Secondary | ICD-10-CM

## 2022-07-04 MED ORDER — DUPIXENT 300 MG/2ML ~~LOC~~ SOAJ: 300.0000 mg | SUBCUTANEOUS | 1 refills | Status: DC

## 2022-07-04 NOTE — Progress Notes (Signed)
HPI Patient presents today to [facility] to see pharmacy team for [place] new start.  Past medical history includes asthma, RA, GERD, HTN, seasonal allergies, and diverticulitis.    No recent hospitalizations for asthma. She has received prednisone 3 times in the last year   Respiratory Medications Current regimen: prednisone '5mg'$  once daily, breztri inhaler 160-9.4 mcg 2 puffs BID, albuterol 58mg 2 puffs q6h prn, fexofenadine '180mg'$  once daily prn  Tried in past: mometasone 1054m Patient reports no known adherence challenges  OBJECTIVE Allergies  Allergen Reactions   Meperidine Nausea Only   Methotrexate Other (See Comments)   Nickel Rash   Tape Rash and Other (See Comments)    Outpatient Encounter Medications as of 07/04/2022  Medication Sig   acetaminophen (TYLENOL) 500 MG tablet Take 1,000 mg by mouth every 6 (six) hours as needed for moderate pain.   albuterol (VENTOLIN HFA) 108 (90 Base) MCG/ACT inhaler Inhale 2 puffs into the lungs every 6 (six) hours as needed for wheezing or shortness of breath.   amLODipine (NORVASC) 10 MG tablet Take 10 mg by mouth daily.    ascorbic acid (VITAMIN C) 1000 MG tablet Take 1,000 mg by mouth daily.   aspirin EC 81 MG tablet Take 81 mg by mouth daily. Swallow whole.   BREZTRI AEROSPHERE 160-9-4.8 MCG/ACT AERO INHALE 2 PUFFS INTO THE LUNGS IN THE MORNING AND AT BEDTIME.   Budeson-Glycopyrrol-Formoterol (BREZTRI AEROSPHERE) 160-9-4.8 MCG/ACT AERO Inhale 2 puffs into the lungs in the morning and at bedtime.   CANNABIDIOL PO Take 2 capsules by mouth at bedtime.   Cholecalciferol (VITAMIN D-3) 125 MCG (5000 UT) TABS Take 1 tablet by mouth daily.   EPINEPHrine (EPIPEN JR) 0.15 MG/0.3ML injection Apply topically 2 (two) times daily.   famotidine (PEPCID) 40 MG tablet Take 1 tablet (40 mg total) by mouth at bedtime.   fexofenadine (ALLEGRA) 180 MG tablet Take 180 mg by mouth daily as needed for allergies.   hydrochlorothiazide (HYDRODIURIL)  25 MG tablet Take 25 mg by mouth daily.   hydrocortisone-pramoxine (ANALPRAM-HC) 2.5-1 % rectal cream PLACE 1 APPLICATION RECTALLY 3 (THREE) TIMES DAILY. (DRUG NOT COVERED)--STOCK SIZE   inFLIXimab-abda (RENFLEXIS) 100 MG SOLR Inject 1 each into the vein every 8 (eight) weeks.   leflunomide (ARAVA) 20 MG tablet 1 tablet   losartan (COZAAR) 100 MG tablet Take 100 mg by mouth daily.   Magnesium 250 MG TABS Take by mouth.   melatonin 1 MG TABS tablet Take 1 mg by mouth as needed.   metoprolol succinate (TOPROL-XL) 25 MG 24 hr tablet TAKE 0.5 TABLETS BY MOUTH DAILY. TAKE WITH OR IMMEDIATELY FOLLOWING A MEAL.   Multiple Vitamin (MULTIVITAMIN) tablet Take 1 tablet by mouth daily.   MYRBETRIQ 25 MG TB24 tablet TAKE 1 TABLET (25 MG TOTAL) BY MOUTH DAILY.   Omega-3 Fatty Acids (FISH OIL) 1200 MG CAPS Take 1,200 mg by mouth 2 (two) times daily.   omeprazole (PRILOSEC) 40 MG capsule TAKE 1 CAPSULE BY MOUTH DAILY BEFORE BREAKFAST. TAKE ONE PILL 20-30 MINUTES BEFORE BREAKFAST MEAL.   polycarbophil (FIBERCON) 625 MG tablet Take 625 mg by mouth daily.   polyethylene glycol (MIRALAX / GLYCOLAX) 17 g packet Take 17 g by mouth every Monday, Wednesday, and Friday.   predniSONE (DELTASONE) 5 MG tablet Take 1 tablet (5 mg total) by mouth daily with breakfast.   rosuvastatin (CRESTOR) 5 MG tablet Take 5 mg by mouth daily.   valACYclovir (VALTREX) 500 MG tablet Take 500 mg by mouth  daily as needed.   No facility-administered encounter medications on file as of 07/04/2022.     Immunization History  Administered Date(s) Administered   Influenza,inj,quad, With Preservative 07/20/2018, 08/20/2019   PFIZER(Purple Top)SARS-COV-2 Vaccination 07/14/2020   Tdap 11/20/2011   Zoster, Live 11/19/2012     PFTs    Latest Ref Rng & Units 01/24/2022    3:54 PM  PFT Results  FVC-Pre L 2.64   FVC-Predicted Pre % 91   FVC-Post L 2.74   FVC-Predicted Post % 95   Pre FEV1/FVC % % 72   Post FEV1/FCV % % 77   FEV1-Pre L 1.90    FEV1-Predicted Pre % 87   FEV1-Post L 2.10   DLCO uncorrected ml/min/mmHg 18.05   DLCO UNC% % 95   DLCO corrected ml/min/mmHg 18.34   DLCO COR %Predicted % 97   DLVA Predicted % 99   TLC L 4.75   TLC % Predicted % 96   RV % Predicted % 104      Eosinophils Most recent blood eosinophil count was 400 cells/microL taken on 6/29.   IgE: 638 on 6/29   Assessment   Biologics training for dupilumab (Dupixent)  Goals of therapy: Mechanism: human monoclonal IgG4 antibody that inhibits interleukin-4 and interleukin-13 cytokine-induced responses, including release of proinflammatory cytokines, chemokines, and IgE Reviewed that Dupixent is add-on medication and patient must continue maintenance inhaler regimen. Response to therapy: may take 4 months to determine efficacy. Discussed that patients generally feel improvement sooner than 4 months.  Side effects: injection site reaction (6-18%), antibody development (5-16%), ophthalmic conjunctivitis (2-16%), transient blood eosinophilia (1-2%)  Dose: '600mg'$  at Week 0 (administered today in clinic) followed by '300mg'$  every 14 days thereafter  Administration/Storage:  Reviewed administration sites of thigh or abdomen (at least 2-3 inches away from abdomen). Reviewed the upper arm is only appropriate if caregiver is administering injection  Do not shake pen/syringe as this could lead to product foaming or precipitation. Do not use if solution is discolored or contains particulate matter or if window on prefilled pen is yellow (indicates pen has been used).  Reviewed storage of medication in refrigerator. Reviewed that [place] can be stored at room temperature in unopened carton for up to 14 days.  Access: Approval of Dupixent through: patient assistance  Patient self-administered Dupixent '300mg'$ /42m x 2 (total dose '600mg'$ ) in left and right thigh using home medication Dupixent '300mg'$ /235mautoinjector pen NDC: [phone]:  [id number]: 02-17-24  Patient monitored for 30 minutes for adverse reaction.  Patient tolerated without issue. Injection site checked and no reactions.  Medication Reconciliation  A drug regimen assessment was performed, including review of allergies, interactions, disease-state management, dosing and immunization history. Medications were reviewed with the patient, including name, instructions, indication, goals of therapy, potential side effects, importance of adherence, and safe use.  Drug interaction(s): none  Immunizations  Patient is indicated for the pneumonia and shingles vaccinations. Patient has received one COVID19 vaccines.   PLAN Continue Dupixent 300 mg every 14 days.  Next dose is due 8/30 and every 14 days thereafter. Rx sent to: [facility]: [phone] Continue maintenance asthma regimen of: breztri inhaler 160-9.4 mcg 2 puffs BID, albuterol 9063m2 puffs q6h prn, fexofenadine '180mg'$  once daily prn  Decrease prednisone to 2.'5mg'$  (1/2 tab) once daily until all taken  All questions encouraged and answered.  Instructed patient to reach out with any further questions or concerns.  Thank you for allowing pharmacy to participate in this patient's care.  This appointment required 30 minutes of patient care (this includes precharting, chart review, review of results, face-to-face care, etc.).

## 2022-07-04 NOTE — Patient Instructions (Addendum)
Your next Dupixent dose is due on 8/30, 9/13, and 9/27 every 14 days thereafter  CONTINUE  breztri inhaler 160-9.4 mcg 2 puffs BID, albuterol 87mg 2 puffs q6h  Decrease prednisone dose to 2.'5mg'$  (1/2 tablet) once daily   Your prescription will be shipped from [facility] Their phone number is [phone] call to schedule shipment and confirm address. They will mail your medication to your home.   You will need to be seen by your provider in 3 to 4 months to assess how Dupixent is working for you. Please ensure you have a follow-up appointment scheduled in November/December. Call our clinic if you need to make this appointment.  How to manage an injection site reaction: Remember the 5 C's: COUNTER - leave on the counter at least 30 minutes but up to overnight to bring medication to room temperature. This may help prevent stinging COLD - place something cold (like an ice gel pack or cold water bottle) on the injection site just before cleansing with alcohol. This may help reduce pain CLARITIN - use Claritin (generic name is loratadine) for the first two weeks of treatment or the day of, the day before, and the day after injecting. This will help to minimize injection site reactions CORTISONE CREAM - apply if injection site is irritated and itching CALL ME - if injection site reaction is bigger than the size of your fist, looks infected, blisters, or if you develop hives

## 2022-07-05 DIAGNOSIS — M0579 Rheumatoid arthritis with rheumatoid factor of multiple sites without organ or systems involvement: Secondary | ICD-10-CM | POA: Diagnosis not present

## 2022-07-05 DIAGNOSIS — Z111 Encounter for screening for respiratory tuberculosis: Secondary | ICD-10-CM | POA: Diagnosis not present

## 2022-07-05 DIAGNOSIS — R5383 Other fatigue: Secondary | ICD-10-CM | POA: Diagnosis not present

## 2022-07-05 DIAGNOSIS — Z79899 Other long term (current) drug therapy: Secondary | ICD-10-CM | POA: Diagnosis not present

## 2022-07-10 DIAGNOSIS — K760 Fatty (change of) liver, not elsewhere classified: Secondary | ICD-10-CM | POA: Diagnosis not present

## 2022-07-10 DIAGNOSIS — G5793 Unspecified mononeuropathy of bilateral lower limbs: Secondary | ICD-10-CM | POA: Diagnosis not present

## 2022-07-10 DIAGNOSIS — Z6827 Body mass index (BMI) 27.0-27.9, adult: Secondary | ICD-10-CM | POA: Diagnosis not present

## 2022-07-10 DIAGNOSIS — M1991 Primary osteoarthritis, unspecified site: Secondary | ICD-10-CM | POA: Diagnosis not present

## 2022-07-10 DIAGNOSIS — M0579 Rheumatoid arthritis with rheumatoid factor of multiple sites without organ or systems involvement: Secondary | ICD-10-CM | POA: Diagnosis not present

## 2022-07-10 DIAGNOSIS — E663 Overweight: Secondary | ICD-10-CM | POA: Diagnosis not present

## 2022-07-10 DIAGNOSIS — Z79899 Other long term (current) drug therapy: Secondary | ICD-10-CM | POA: Diagnosis not present

## 2022-07-13 ENCOUNTER — Other Ambulatory Visit: Payer: Self-pay

## 2022-07-13 ENCOUNTER — Ambulatory Visit (INDEPENDENT_AMBULATORY_CARE_PROVIDER_SITE_OTHER): Payer: PPO | Admitting: Allergy

## 2022-07-13 ENCOUNTER — Encounter: Payer: Self-pay | Admitting: Allergy

## 2022-07-13 VITALS — BP 128/86 | HR 77 | Temp 97.9°F | Resp 16 | Ht 62.0 in | Wt 150.8 lb

## 2022-07-13 DIAGNOSIS — J4530 Mild persistent asthma, uncomplicated: Secondary | ICD-10-CM | POA: Diagnosis not present

## 2022-07-13 DIAGNOSIS — H1013 Acute atopic conjunctivitis, bilateral: Secondary | ICD-10-CM | POA: Diagnosis not present

## 2022-07-13 DIAGNOSIS — J3089 Other allergic rhinitis: Secondary | ICD-10-CM | POA: Diagnosis not present

## 2022-07-13 DIAGNOSIS — R0602 Shortness of breath: Secondary | ICD-10-CM

## 2022-07-13 MED ORDER — IPRATROPIUM BROMIDE 0.06 % NA SOLN: 2.0000 | Freq: Two times a day (BID) | NASAL | 5 refills | Status: DC

## 2022-07-13 NOTE — Patient Instructions (Addendum)
Shortness of breath History of Asthma  - agree with pulmonology in regards to [place].  Continue injections every 2 weeks at this time - continue Breztri 2 puffs twice a day - have access to albuterol inhaler 2 puffs every 4-6 hours as needed for cough/wheeze/shortness of breath/chest tightness.  May use 15-20 minutes prior to activity.   Monitor frequency of use.   - continue prednisone as recommended by pulmonology - would consider pulmonary rehab to re-condition the lungs  Breathing control goals:  Full participation in all desired activities (may need albuterol before activity) Albuterol use two time or less a week on average (not counting use with activity) Cough interfering with sleep two time or less a month Oral steroids no more than once a year No hospitalizations  - allergy testing was positive for dust mites, cat, dog, tree pollen, grass pollen, weed pollens.  Allergen avoidance measures provided - continue 1/2 tab Allegra daily - start Atrovent nasal spray 2 sprays daily at this time.  For nasal drainage/runny nose can take up to 4 times a day as needed - for itch/watery eyes can use OTC Pataday or Pataday extra strength 1 drop each eye daily as needed - if medication management is not effective then can consider course of allergen immunotherapy  Follow-up in 4-6 months or sooner if needed

## 2022-07-13 NOTE — Progress Notes (Signed)
New Patient Note  RE: Courtney Crane MRN: [id number] DOB: 13-Oct-1951 Date of Office Visit: 07/13/2022   Primary care provider: Velna Hatchet, MD  Chief Complaint: allergies, shortness of breath  History of present illness: Courtney Crane is a 71 y.o. female presenting today for evaluation of environmental allergy and shortness of breath.  She has a history of asthma.   She states about a year ago she started having shortness of breath.  She states she has had a work-up that included cardiology evaluation that was normal.  Then she was sent to GI and then pulmonologist.  She has been seeing a pulmonologist, Dr Valeta Harms, who recommended she see an allergist.  She has full PFTs which are were normal as well as a high-resolution CT from 3/23 see results below.   She states she can't do any physical activity without "completely losing my breath".  She states she has been tried on couple different things. The only thing that seems to have made a difference is prednisone.  She is still on Breztri inhaler 2 puffs twice a day and does not really feel it helps all that much.  However she states she can go for walks now as long as she doesn't walk fast and it isn't too hot outside. She states before all these issues started she could walk for 3-5 miles and now can only do about 1/4 mile.  She feels like mucinex has not helped.  She was recently started on Dupixent.  She has had a loading dose.  She is currently on prednisone she states has 46 pills left and has been advised to continue prednisone until the [place] starts working. She has had allergy panel done in 6/23 that showed detectable IgE to dust mites, cat, dog, grasses, trees, weeds.  Total IgE 638.  She had an eos abs count of 400.   She does report a runny nose often that irritates the throat and causes sore throat.  She also reports a lot of itchy/watery eyes. She states she carries around throat lozenges.  She takes 1/2 Human resources officer daily. A  whole allegra makes her too dry.   She uses nasal saline spray.  She has tried flonase and nasocort but does not have congestion.   She reports she did undergo allergen immunotherapy as a child which was helpful back then.    She states she does have areas of eczema on her legs.  She sees a dermatologist and has been prescribed fluocinolone ointment.    No history of food allergy.  She does report lactose intolerance.   She has a significant medical history including hypertension, hyperlipidemia, IBS, diverticulitis, RA, OA.  She is on infliximab every 6 weeks.  Review of systems: Review of Systems  Constitutional: Negative.   HENT:         See HPI  Eyes: Negative.   Respiratory:         See HPI  Cardiovascular: Negative.   Gastrointestinal: Negative.   Musculoskeletal: Negative.   Skin: Negative.   Allergic/Immunologic: Negative.   Neurological: Negative.     All other systems negative unless noted above in HPI  Past medical history: Past Medical History:  Diagnosis Date   Anemia    Anxiety    Arthritis    Asthma    Cataract    Colon polyps    Complication of anesthesia    "sometimes I dont like to wake up after " ie difficult to awaken    Diverticulitis  Diverticulosis    Fatty liver    GERD (gastroesophageal reflux disease)    HLD (hyperlipidemia)    HTN (hypertension)    IBS (irritable bowel syndrome)    Insomnia    MRSA infection    Osteoarthritis    Osteopenia    PONV (postoperative nausea and vomiting)    RA (rheumatoid arthritis) (HCC)    Rheumatoid arthritis (HCC)    Seasonal allergies    Shingles    Status post dilation of esophageal narrowing    Vitamin D deficiency     Past surgical history: Past Surgical History:  Procedure Laterality Date   ADENOIDECTOMY     APPENDECTOMY  1975   bladder tack  1990   CATARACT EXTRACTION, BILATERAL Bilateral    CHOLECYSTECTOMY N/A 01/31/2017   Procedure: LAPAROSCOPIC CHOLECYSTECTOMY WITH INTRAOPERATIVE  CHOLANGIOGRAM;  Surgeon: Armandina Gemma, MD;  Location: [facility];  Service: General;  Laterality: N/A;   INCISION AND DRAINAGE PERIRECTAL ABSCESS N/A 12/02/2017   Procedure: IRRIGATION AND DEBRIDEMENT PERINEAL ABSCESS;  Surgeon: Greer Pickerel, MD;  Location: [place];  Service: General;  Laterality: N/A;   MENISCUS REPAIR Right 2013   ROTATOR CUFF REPAIR Right 2005   TONSILLECTOMY     as a child   TOTAL ABDOMINAL HYSTERECTOMY  1988   TOTAL KNEE ARTHROPLASTY Left    WRIST RECONSTRUCTION Right 2008    Family history:  Family History  Problem Relation Age of Onset   Colon polyps Mother    Diabetes Mother    Heart disease Mother    Kidney cancer Mother    Arthritis Mother    Hypertension Mother    Hyperlipidemia Mother    Colon polyps Son    CAD Brother    Esophageal cancer Brother    Dementia Father    Colon cancer Neg Hx    Stomach cancer Neg Hx    Rectal cancer Neg Hx    Prostate cancer Neg Hx     Social history: Lives in a home with carpeting in the bedroom with gas heating and heat pump cooling.  No concern for water damage, mildew or roaches in the home.  She is retired.  Denies smoking history.    Medication List: Current Outpatient Medications  Medication Sig Dispense Refill   acetaminophen (TYLENOL) 500 MG tablet Take 1,000 mg by mouth every 6 (six) hours as needed for moderate pain.     albuterol (VENTOLIN HFA) 108 (90 Base) MCG/ACT inhaler Inhale 2 puffs into the lungs every 6 (six) hours as needed for wheezing or shortness of breath. 8 g 6   amLODipine (NORVASC) 10 MG tablet Take 10 mg by mouth daily.      ascorbic acid (VITAMIN C) 1000 MG tablet Take 1,000 mg by mouth daily.     aspirin EC 81 MG tablet Take 81 mg by mouth daily. Swallow whole.     BREZTRI AEROSPHERE 160-9-4.8 MCG/ACT AERO INHALE 2 PUFFS INTO THE LUNGS IN THE MORNING AND AT BEDTIME. 10.7 each 1   CANNABIDIOL PO Take 2 capsules by mouth at bedtime.     Cholecalciferol (VITAMIN D-3) 125 MCG (5000 UT) TABS  Take 1 tablet by mouth daily.     Dupilumab (DUPIXENT) 300 MG/2ML SOPN Inject 300 mg into the skin every 14 (fourteen) days. 12 mL 1   famotidine (PEPCID) 40 MG tablet Take 1 tablet (40 mg total) by mouth at bedtime. 90 tablet 3   fexofenadine (ALLEGRA) 180 MG tablet Take 180 mg by mouth daily  as needed for allergies.     hydrochlorothiazide (HYDRODIURIL) 25 MG tablet Take 25 mg by mouth daily.     hydrocortisone-pramoxine (ANALPRAM-HC) 2.5-1 % rectal cream PLACE 1 APPLICATION RECTALLY 3 (THREE) TIMES DAILY. (DRUG NOT COVERED)--STOCK SIZE 120 g 0   inFLIXimab-abda (RENFLEXIS) 100 MG SOLR Inject 1 each into the vein every 8 (eight) weeks.     ipratropium (ATROVENT) 0.06 % nasal spray Place 2 sprays into both nostrils 2 (two) times daily. 15 mL 5   leflunomide (ARAVA) 20 MG tablet 1 tablet     losartan (COZAAR) 100 MG tablet Take 100 mg by mouth daily.     Magnesium 250 MG TABS Take by mouth.     melatonin 1 MG TABS tablet Take 1 mg by mouth as needed.     metoprolol succinate (TOPROL-XL) 25 MG 24 hr tablet TAKE 0.5 TABLETS BY MOUTH DAILY. TAKE WITH OR IMMEDIATELY FOLLOWING A MEAL. 45 tablet 1   Multiple Vitamin (MULTIVITAMIN) tablet Take 1 tablet by mouth daily.     MYRBETRIQ 25 MG TB24 tablet TAKE 1 TABLET (25 MG TOTAL) BY MOUTH DAILY. 30 tablet 5   Omega-3 Fatty Acids (FISH OIL) 1200 MG CAPS Take 1,200 mg by mouth 2 (two) times daily.     omeprazole (PRILOSEC) 40 MG capsule TAKE 1 CAPSULE BY MOUTH DAILY BEFORE BREAKFAST. TAKE ONE PILL 20-30 MINUTES BEFORE BREAKFAST MEAL. 90 capsule 3   polycarbophil (FIBERCON) 625 MG tablet Take 625 mg by mouth daily.     polyethylene glycol (MIRALAX / GLYCOLAX) 17 g packet Take 17 g by mouth every Monday, Wednesday, and Friday.     predniSONE (DELTASONE) 5 MG tablet Take 1 tablet (5 mg total) by mouth daily with breakfast. 60 tablet 0   rosuvastatin (CRESTOR) 5 MG tablet Take 5 mg by mouth daily.     valACYclovir (VALTREX) 500 MG tablet Take 500 mg by mouth  daily as needed.     No current facility-administered medications for this visit.    Known medication allergies: Allergies  Allergen Reactions   Meperidine Nausea Only   Methotrexate Other (See Comments)   Nickel Rash   Tape Rash and Other (See Comments)     Physical examination: Blood pressure 128/86, pulse 77, temperature 97.9 F (36.6 C), resp. rate 16, height '5\' 2"'$  (1.575 m), weight 150 lb 12.8 oz (68.4 kg), SpO2 96 %.  General: Alert, interactive, in no acute distress. HEENT: PERRLA, TMs pearly gray, turbinates non-edematous without discharge, post-pharynx  with mild cobblestoning . Neck: Supple without lymphadenopathy. Lungs: Clear to auscultation without wheezing, rhonchi or rales. {no increased work of breathing. CV: Normal S1, S2 without murmurs. Abdomen: Nondistended, nontender. Skin: Warm and dry, without lesions or rashes. Extremities:  No clubbing, cyanosis or edema. Neuro:   Grossly intact.  Diagnositics/Labs: Labs/Imaging:  HRCT 01/26/22:  1. No evidence of interstitial lung disease. 2. Small parenchymal band in the posterior inferior lingula, compatible with nonspecific mild postinfectious/postinflammatory scarring. 3. One vessel coronary atherosclerosis. 4. Diffuse hepatic steatosis. 5. Small circumscribed 1.1 cm rounded cystic structure in the left paratracheal region at the level of the thoracic inlet, potentially an exophytic inferior left thyroid nodule. Consider further evaluation with thyroid ultrasound. 6. Mild left colonic diverticulosis. 7. Tiny 2 mm solid peripheral right upper lobe pulmonary nodule. No follow-up needed if patient is low-risk.This recommendation follows the consensus statement: Guidelines for Management of Incidental Pulmonary Nodules Detected on CT Images: From the Fleischner Society 2017; Radiology 2017; 284:228-243. 8. Aortic  Atherosclerosis (ICD10-I70.0).  CXR 05/17/22: No acute cardiopulmonary abnormality.  Thyroid US  06/04/22: 1. The left lower pole thyroid nodule seen on prior chest CT corresponds with a 1.8 cm simple cyst. No further follow-up required. 2. No other thyroid nodules visualized that would warrant further evaluation.  Component     Latest Ref Rng 05/17/2022  IgE (Immunoglobulin E), Serum     6 - 495 IU/mL 638 (H)   D Pteronyssinus IgE     Class IV kU/L 7.10 !   D Farinae IgE     Class IV kU/L 8.43 !   Cat Dander IgE     Class I kU/L 0.43 !   Dog Dander IgE     Class 0/I kU/L 0.14 !   [place] Grass IgE     Class III kU/L 1.43 !   Timothy Grass IgE     Class III kU/L 3.42 !   [place] Bluegrass IgE     Class IV kU/L 4.57 !   [place] Grass IgE     Class III kU/L 2.21 !   [place] Grass IgE     Class III kU/L 2.94 !   Cockroach, American IgE     Class 0 kU/L <0.10   Penicillium Chrysogen IgE     Class 0 kU/L <0.10   Cladosporium Herbarum IgE     Class 0 kU/L <0.10   Aspergillus Fumigatus IgE     Class 0 kU/L <0.10   Mucor Racemosus IgE     Class 0 kU/L <0.10   Alternaria Alternata IgE     Class 0 kU/L <0.10   Setomelanomma Rostrat     Class 0 kU/L <0.10   Oak, White IgE     Class 0/I kU/L 0.31 !   Elm, American IgE     Class 0/I kU/L 0.20 !   Maple/Box Elder IgE     Class 0/I kU/L 0.18 !   Common Silver Wendee Copp IgE     Class I kU/L 0.38 !   Hickory, White IgE     Class II kU/L 0.75 !   White Mulberry IgE     Class 0 kU/L <0.10   Red Oak, [place] IgE     Class I kU/L 0.43 !   Ragweed, Short IgE     Class I kU/L 0.54 !   Plantain, English IgE     Class 0/I kU/L 0.15 !   Cocklebur IgE     Class II kU/L 0.71 !   Pigweed, Rough IgE     Class 0/I kU/L 0.14 !   WBC     4.0 - 10.5 K/uL 6.5   RBC     3.87 - 5.11 Mil/uL 4.29   Hemoglobin     12.0 - 15.0 g/dL 13.0   HCT     36.0 - 46.0 % 39.2   MCV     78.0 - 100.0 fl 91.4   MCHC     30.0 - 36.0 g/dL 33.2   RDW     11.5 - 15.5 % 15.0   Platelets     150.0 - 400.0 K/uL 173.0   Neutrophils     43.0 - 77.0 %  45.3   Lymphocytes     12.0 - 46.0 % 32.0   Monocytes Relative     3.0 - 12.0 % 15.6 (H)   Eosinophil     0.0 - 5.0 % 5.8 (H)   Basophil     0.0 - 3.0 %  1.3   NEUT#     1.4 - 7.7 K/uL 3.0   Lymphocyte #     0.7 - 4.0 K/uL 2.1   Monocyte #     0.1 - 1.0 K/uL 1.0   Eosinophils Absolute     0.0 - 0.7 K/uL 0.4   Basophils Absolute     0.0 - 0.1 K/uL 0.1   FeNO level (ppb) 29        Spirometry: FEV1: 1.68 L 83%, FVC: 2.27 L 87%, ratio consistent with nonobstructive pattern  Assessment and plan:   Shortness of breath History of Asthma Allergic rhinitis with conjunctivitis  - agree with pulmonology in regards to [place].  Continue injections every 2 weeks at this time - continue Breztri 2 puffs twice a day - have access to albuterol inhaler 2 puffs every 4-6 hours as needed for cough/wheeze/shortness of breath/chest tightness.  May use 15-20 minutes prior to activity.   Monitor frequency of use.   - continue prednisone as recommended by pulmonology - would consider pulmonary rehab to re-condition the lungs  Breathing control goals:  Full participation in all desired activities (may need albuterol before activity) Albuterol use two time or less a week on average (not counting use with activity) Cough interfering with sleep two time or less a month Oral steroids no more than once a year No hospitalizations  - allergy testing was positive for dust mites, cat, dog, tree pollen, grass pollen, weed pollens.  Allergen avoidance measures provided - continue 1/2 tab Allegra daily - start Atrovent nasal spray 2 sprays daily at this time.  For nasal drainage/runny nose can take up to 4 times a day as needed - for itch/watery eyes can use OTC Pataday or Pataday extra strength 1 drop each eye daily as needed - if medication management is not effective then can consider course of allergen immunotherapy  Follow-up in 4-6 months or sooner if needed  I appreciate the opportunity to take  part in [place] care. Please do not hesitate to contact me with questions.  Sincerely,   Prudy Feeler, MD Allergy/Immunology [facility]

## 2022-07-19 NOTE — Addendum Note (Signed)
Addended by: Chip Boer R on: 07/19/2022 02:02 PM   Modules accepted: Orders

## 2022-08-01 ENCOUNTER — Ambulatory Visit: Payer: PPO | Admitting: Obstetrics and Gynecology

## 2022-08-01 ENCOUNTER — Encounter: Payer: Self-pay | Admitting: Obstetrics and Gynecology

## 2022-08-01 VITALS — BP 152/71 | HR 97

## 2022-08-01 DIAGNOSIS — M25511 Pain in right shoulder: Secondary | ICD-10-CM | POA: Diagnosis not present

## 2022-08-01 DIAGNOSIS — N3281 Overactive bladder: Secondary | ICD-10-CM | POA: Diagnosis not present

## 2022-08-01 DIAGNOSIS — N393 Stress incontinence (female) (male): Secondary | ICD-10-CM | POA: Diagnosis not present

## 2022-08-01 DIAGNOSIS — M67811 Other specified disorders of synovium, right shoulder: Secondary | ICD-10-CM | POA: Diagnosis not present

## 2022-08-01 DIAGNOSIS — K644 Residual hemorrhoidal skin tags: Secondary | ICD-10-CM | POA: Diagnosis not present

## 2022-08-01 NOTE — Progress Notes (Signed)
Urogynecology   Subjective:     Chief Complaint:  Chief Complaint  Patient presents with   Follow-up   History of Present Illness: Cyann Venti is a 71 y.o. female with stage II posterior POP, stress incontinence and OAB who presents for a pessary check. She is using a size #0 incontinence dish pessary. She is very happy with the pessary as leakage has improved greatly. She cleans it about every week. Denies vaginal bleeding. She is on the '25mg'$  Myrbetriq and has been working well for her urgency. Occasionally will have a few drops of leakage but it is not bothersome to her.   Past Medical History: Patient  has a past medical history of Anemia, Anxiety, Arthritis, Asthma, Cataract, Colon polyps, Complication of anesthesia, Diverticulitis, Diverticulosis, Fatty liver, GERD (gastroesophageal reflux disease), HLD (hyperlipidemia), HTN (hypertension), IBS (irritable bowel syndrome), Insomnia, MRSA infection, Osteoarthritis, Osteopenia, PONV (postoperative nausea and vomiting), RA (rheumatoid arthritis) ([place]), Rheumatoid arthritis ([place]), Seasonal allergies, Shingles, Status post dilation of esophageal narrowing, and Vitamin D deficiency.   Past Surgical History: She  has a past surgical history that includes Appendectomy (1975); Total abdominal hysterectomy (1988); Tonsillectomy; bladder tack (1990); Rotator cuff repair (Right, 2005); Wrist reconstruction (Right, 2008); Meniscus repair (Right, 2013); Total knee arthroplasty (Left); Cholecystectomy (N/A, 01/31/2017); Incision and drainage perirectal abscess (N/A, 12/02/2017); Cataract extraction, bilateral (Bilateral); and Adenoidectomy.   Medications: She has a current medication list which includes the following prescription(s): acetaminophen, albuterol, amlodipine, ascorbic acid, aspirin ec, breztri aerosphere, cannabidiol, vitamin d-3, dupixent, famotidine, fexofenadine, hydrochlorothiazide, hydrocortisone-pramoxine, renflexis,  ipratropium, leflunomide, losartan, magnesium, melatonin, metoprolol succinate, multivitamin, myrbetriq, fish oil, omeprazole, polycarbophil, polyethylene glycol, rosuvastatin, and valacyclovir.   Allergies: Patient is allergic to meperidine, methotrexate, nickel, and tape.   Social History: Patient  reports that she has never smoked. She has never used smokeless tobacco. She reports current alcohol use of about 7.0 standard drinks of alcohol per week. She reports that she does not use drugs.      Objective:    Physical Exam: BP (!) 152/71 Comment: Excited  Pulse 97  Gen: No apparent distress, A&O x 3. Detailed Urogynecologic Evaluation:  Pelvic Exam: Normal external female genitalia; Bartholin's and Skene's glands normal in appearance; urethral meatus normal in appearance, no urethral masses or discharge. The pessary was noted to be in place. It was removed and cleaned. Speculum exam revealed no lesions in the vagina. The pessary was replaced. It was comfortable to the patient and fit well.   POP-Q:    POP-Q   -3                                            Aa   -3                                           Ba   -5.5                                              C    3.5  Gh   5                                            Pb   7                                            tvl    0                                            Ap   0                                            Bp                                                  D         Assessment/Plan:    Assessment: Ms. Horseman is a 71 y.o. with stage II pelvic organ prolapse, stress incontinence, and OAB here for a pessary check. She is doing well.  Plan: - Continue #0 incontinence dish pessary. Remove weekly or as needed.  - Continue Myrbetriq '25mg'$ . Refills sent a few weeks ago.   Follow up 1 year or sooner if needed  Jaquita Folds, MD   Time spent: I spent 20  minutes dedicated to the care of this patient on the date of this encounter to include pre-visit review of records, face-to-face time with the patient and post visit documentation.

## 2022-08-02 DIAGNOSIS — M25511 Pain in right shoulder: Secondary | ICD-10-CM | POA: Diagnosis not present

## 2022-08-02 DIAGNOSIS — R531 Weakness: Secondary | ICD-10-CM | POA: Diagnosis not present

## 2022-08-02 DIAGNOSIS — M25611 Stiffness of right shoulder, not elsewhere classified: Secondary | ICD-10-CM | POA: Diagnosis not present

## 2022-08-07 DIAGNOSIS — R531 Weakness: Secondary | ICD-10-CM | POA: Diagnosis not present

## 2022-08-07 DIAGNOSIS — M25611 Stiffness of right shoulder, not elsewhere classified: Secondary | ICD-10-CM | POA: Diagnosis not present

## 2022-08-07 DIAGNOSIS — M25511 Pain in right shoulder: Secondary | ICD-10-CM | POA: Diagnosis not present

## 2022-08-10 DIAGNOSIS — M25511 Pain in right shoulder: Secondary | ICD-10-CM | POA: Diagnosis not present

## 2022-08-10 DIAGNOSIS — R531 Weakness: Secondary | ICD-10-CM | POA: Diagnosis not present

## 2022-08-10 DIAGNOSIS — M25611 Stiffness of right shoulder, not elsewhere classified: Secondary | ICD-10-CM | POA: Diagnosis not present

## 2022-08-15 DIAGNOSIS — M25611 Stiffness of right shoulder, not elsewhere classified: Secondary | ICD-10-CM | POA: Diagnosis not present

## 2022-08-15 DIAGNOSIS — R531 Weakness: Secondary | ICD-10-CM | POA: Diagnosis not present

## 2022-08-15 DIAGNOSIS — M25511 Pain in right shoulder: Secondary | ICD-10-CM | POA: Diagnosis not present

## 2022-08-16 ENCOUNTER — Other Ambulatory Visit: Payer: Self-pay | Admitting: Pulmonary Disease

## 2022-08-16 DIAGNOSIS — M0579 Rheumatoid arthritis with rheumatoid factor of multiple sites without organ or systems involvement: Secondary | ICD-10-CM | POA: Diagnosis not present

## 2022-08-17 DIAGNOSIS — M25511 Pain in right shoulder: Secondary | ICD-10-CM | POA: Diagnosis not present

## 2022-08-17 DIAGNOSIS — M25611 Stiffness of right shoulder, not elsewhere classified: Secondary | ICD-10-CM | POA: Diagnosis not present

## 2022-08-17 DIAGNOSIS — R531 Weakness: Secondary | ICD-10-CM | POA: Diagnosis not present

## 2022-08-20 DIAGNOSIS — M25611 Stiffness of right shoulder, not elsewhere classified: Secondary | ICD-10-CM | POA: Diagnosis not present

## 2022-08-20 DIAGNOSIS — M25511 Pain in right shoulder: Secondary | ICD-10-CM | POA: Diagnosis not present

## 2022-08-20 DIAGNOSIS — R531 Weakness: Secondary | ICD-10-CM | POA: Diagnosis not present

## 2022-08-24 DIAGNOSIS — M25611 Stiffness of right shoulder, not elsewhere classified: Secondary | ICD-10-CM | POA: Diagnosis not present

## 2022-08-24 DIAGNOSIS — R531 Weakness: Secondary | ICD-10-CM | POA: Diagnosis not present

## 2022-08-24 DIAGNOSIS — M25511 Pain in right shoulder: Secondary | ICD-10-CM | POA: Diagnosis not present

## 2022-08-29 DIAGNOSIS — M25511 Pain in right shoulder: Secondary | ICD-10-CM | POA: Diagnosis not present

## 2022-08-29 DIAGNOSIS — R531 Weakness: Secondary | ICD-10-CM | POA: Diagnosis not present

## 2022-08-29 DIAGNOSIS — M25611 Stiffness of right shoulder, not elsewhere classified: Secondary | ICD-10-CM | POA: Diagnosis not present

## 2022-08-31 DIAGNOSIS — M25611 Stiffness of right shoulder, not elsewhere classified: Secondary | ICD-10-CM | POA: Diagnosis not present

## 2022-08-31 DIAGNOSIS — R531 Weakness: Secondary | ICD-10-CM | POA: Diagnosis not present

## 2022-08-31 DIAGNOSIS — M25511 Pain in right shoulder: Secondary | ICD-10-CM | POA: Diagnosis not present

## 2022-09-03 ENCOUNTER — Ambulatory Visit: Payer: PPO | Admitting: Pulmonary Disease

## 2022-09-03 ENCOUNTER — Encounter: Payer: Self-pay | Admitting: Pulmonary Disease

## 2022-09-03 VITALS — BP 134/60 | HR 87 | Temp 98.4°F | Ht 62.0 in | Wt 148.8 lb

## 2022-09-03 DIAGNOSIS — M25611 Stiffness of right shoulder, not elsewhere classified: Secondary | ICD-10-CM | POA: Diagnosis not present

## 2022-09-03 DIAGNOSIS — R531 Weakness: Secondary | ICD-10-CM | POA: Diagnosis not present

## 2022-09-03 DIAGNOSIS — T7849XA Other allergy, initial encounter: Secondary | ICD-10-CM | POA: Diagnosis not present

## 2022-09-03 DIAGNOSIS — Z23 Encounter for immunization: Secondary | ICD-10-CM

## 2022-09-03 DIAGNOSIS — J4551 Severe persistent asthma with (acute) exacerbation: Secondary | ICD-10-CM | POA: Diagnosis not present

## 2022-09-03 DIAGNOSIS — J3089 Other allergic rhinitis: Secondary | ICD-10-CM

## 2022-09-03 DIAGNOSIS — R768 Other specified abnormal immunological findings in serum: Secondary | ICD-10-CM

## 2022-09-03 DIAGNOSIS — M25511 Pain in right shoulder: Secondary | ICD-10-CM | POA: Diagnosis not present

## 2022-09-03 NOTE — Progress Notes (Signed)
Synopsis: Referred in Feb 2023 for chest pains  by Velna Hatchet, MD  Subjective:   PATIENT ID: Courtney Crane GENDER: female DOB: Apr 15, 1951, MRN: [id number]  Chief Complaint  Patient presents with   Follow-up    Pt states she has been doing okay since last visit and denies any real complaints.    PMH HTN, HLD, IBS, diverticulitis, Rheumatoid Arthritis, OA. She on infliximab q6 weeks. On meloxicam. New dx of RA as of 2018.  Patient had a recent ER visit for evaluation.  CTA of the chest completed which was negative for PE.  From respiratory standpoint she still has shortness of breath with exertion.  She occasionally has flareups of her RA still on infliximab.  Currently using meloxicam for pain control.  We reviewed her CT findings today in the office.  Her shortness of breath is predominantly associated with exertion.  She also noticed when it is hot and humid outside occasionally she feels short of breath.  OV 03/28/2022: Here today for follow-up.  She had PFTs completed as well as HRCT imaging.  HRCT imaging was reassuring with small nodule still persistent however no evidence of interstitial lung disease or ILD related to RA.  Also her lung function was normal we reviewed this PFT today in the office with patient and patient's husband present today.  She still has cough and shortness of breath.  She did have asthma as a child she does feel like her cough is still there and take some time for it to clear.  She has been on prednisone in the past but not really sure how she was breathing when she was on it mainly this was due to her RA at the time.  She also thinks that her albuterol has been helping and she when she uses her albuterol she is able to complete her exercises and walk outside better.  OV 05/30/2022: Here today for follow-up.  Was recently seen in the office with a repeat exacerbation.  Started on prednisone she breathes much better when she is on prednisone.  Felt that were  probably dealing with more of an atopic bronchitis type picture.  Likely dealing with severe asthma.Patient had recent lab work with 5.8% eosinophils, eosinophil count greater than 400.  Significantly positive RAST panel and an IgE of 638.  She was treated with prednisone and she feels much better when she is on it.  She has been using her Breztri daily as well as as needed albuterol.  She had her Dupixent approved but she has not yet started.  OV 09/03/2022: Patient here today for follow-up.  Feels better today.  The best that she is breathing this entire year.  She feels so much better after starting Breztri and Dupixent injections.  From a respiratory standpoint she is been able to get out and start walking again and going to the gym.    Past Medical History:  Diagnosis Date   Anemia    Anxiety    Arthritis    Asthma    Cataract    Colon polyps    Complication of anesthesia    "sometimes I dont like to wake up after " ie difficult to awaken    Diverticulitis    Diverticulosis    Fatty liver    GERD (gastroesophageal reflux disease)    HLD (hyperlipidemia)    HTN (hypertension)    IBS (irritable bowel syndrome)    Insomnia    MRSA infection    Osteoarthritis  Osteopenia    PONV (postoperative nausea and vomiting)    RA (rheumatoid arthritis) (HCC)    Rheumatoid arthritis (HCC)    Seasonal allergies    Shingles    Status post dilation of esophageal narrowing    Vitamin D deficiency      Family History  Problem Relation Age of Onset   Colon polyps Mother    Diabetes Mother    Heart disease Mother    Kidney cancer Mother    Arthritis Mother    Hypertension Mother    Hyperlipidemia Mother    Colon polyps Son    CAD Brother    Esophageal cancer Brother    Dementia Father    Colon cancer Neg Hx    Stomach cancer Neg Hx    Rectal cancer Neg Hx    Prostate cancer Neg Hx      Past Surgical History:  Procedure Laterality Date   ADENOIDECTOMY     APPENDECTOMY  1975    bladder tack  1990   CATARACT EXTRACTION, BILATERAL Bilateral    CHOLECYSTECTOMY N/A 01/31/2017   Procedure: LAPAROSCOPIC CHOLECYSTECTOMY WITH INTRAOPERATIVE CHOLANGIOGRAM;  Surgeon: Armandina Gemma, MD;  Location: [facility];  Service: General;  Laterality: N/A;   INCISION AND DRAINAGE PERIRECTAL ABSCESS N/A 12/02/2017   Procedure: IRRIGATION AND DEBRIDEMENT PERINEAL ABSCESS;  Surgeon: Greer Pickerel, MD;  Location: [facility] OR;  Service: General;  Laterality: N/A;   MENISCUS REPAIR Right 2013   ROTATOR CUFF REPAIR Right 2005   TONSILLECTOMY     as a child   TOTAL ABDOMINAL HYSTERECTOMY  1988   TOTAL KNEE ARTHROPLASTY Left    WRIST RECONSTRUCTION Right 2008    Social History   Socioeconomic History   Marital status: Married    Spouse name: Not on file   Number of children: 2   Years of education: Not on file   Highest education level: Not on file  Occupational History   Occupation: retired  Tobacco Use   Smoking status: Never   Smokeless tobacco: Never  Vaping Use   Vaping Use: Never used  Substance and Sexual Activity   Alcohol use: Yes    Alcohol/week: 7.0 standard drinks of alcohol    Types: 7 Standard drinks or equivalent per week    Comment: wine daily   Drug use: No    Comment: takes CBD oil   Sexual activity: Yes    Partners: Male    Birth control/protection: Post-menopausal    Comment: **Medicare questions asked---NOT high risk**  Other Topics Concern   Not on file  Social History Narrative   Retired from [employer]   Lives at home with husband ; one level home   Right handed   Highest level of education:  Some college   Social Determinants of Health   Financial Resource Strain: Not on file  Food Insecurity: Not on file  Transportation Needs: Not on file  Physical Activity: Not on file  Stress: Not on file  Social Connections: Not on file  Intimate Partner Violence: Not on file     Allergies  Allergen Reactions   Meperidine Nausea Only   Methotrexate Other (See  Comments)   Nickel Rash   Tape Rash and Other (See Comments)     Outpatient Medications Prior to Visit  Medication Sig Dispense Refill   acetaminophen (TYLENOL) 500 MG tablet Take 1,000 mg by mouth every 6 (six) hours as needed for moderate pain.     albuterol (VENTOLIN HFA) 108 (90 Base)  MCG/ACT inhaler Inhale 2 puffs into the lungs every 6 (six) hours as needed for wheezing or shortness of breath. 8 g 6   amLODipine (NORVASC) 10 MG tablet Take 10 mg by mouth daily.      ascorbic acid (VITAMIN C) 1000 MG tablet Take 1,000 mg by mouth daily.     aspirin EC 81 MG tablet Take 81 mg by mouth daily. Swallow whole.     BREZTRI AEROSPHERE 160-9-4.8 MCG/ACT AERO INHALE 2 PUFFS INTO THE LUNGS IN THE MORNING AND AT BEDTIME. 10.7 each 1   CANNABIDIOL PO Take 2 capsules by mouth at bedtime.     Cholecalciferol (VITAMIN D-3) 125 MCG (5000 UT) TABS Take 1 tablet by mouth daily.     Dupilumab (DUPIXENT) 300 MG/2ML SOPN Inject 300 mg into the skin every 14 (fourteen) days. 12 mL 1   famotidine (PEPCID) 40 MG tablet Take 1 tablet (40 mg total) by mouth at bedtime. 90 tablet 3   fexofenadine (ALLEGRA) 180 MG tablet Take 180 mg by mouth daily as needed for allergies.     hydrochlorothiazide (HYDRODIURIL) 25 MG tablet Take 25 mg by mouth daily.     inFLIXimab-abda (RENFLEXIS) 100 MG SOLR Inject 1 each into the vein every 8 (eight) weeks.     ipratropium (ATROVENT) 0.06 % nasal spray Place 2 sprays into both nostrils 2 (two) times daily. 15 mL 5   leflunomide (ARAVA) 20 MG tablet 1 tablet     losartan (COZAAR) 100 MG tablet Take 100 mg by mouth daily.     Magnesium 250 MG TABS Take by mouth.     melatonin 1 MG TABS tablet Take 1 mg by mouth as needed.     metoprolol succinate (TOPROL-XL) 25 MG 24 hr tablet TAKE 0.5 TABLETS BY MOUTH DAILY. TAKE WITH OR IMMEDIATELY FOLLOWING A MEAL. 45 tablet 1   Multiple Vitamin (MULTIVITAMIN) tablet Take 1 tablet by mouth daily.     MYRBETRIQ 25 MG TB24 tablet TAKE 1 TABLET  (25 MG TOTAL) BY MOUTH DAILY. 30 tablet 5   Omega-3 Fatty Acids (FISH OIL) 1200 MG CAPS Take 1,200 mg by mouth 2 (two) times daily.     omeprazole (PRILOSEC) 40 MG capsule TAKE 1 CAPSULE BY MOUTH DAILY BEFORE BREAKFAST. TAKE ONE PILL 20-30 MINUTES BEFORE BREAKFAST MEAL. 90 capsule 3   polycarbophil (FIBERCON) 625 MG tablet Take 625 mg by mouth daily.     polyethylene glycol (MIRALAX / GLYCOLAX) 17 g packet Take 17 g by mouth every Monday, Wednesday, and Friday.     rosuvastatin (CRESTOR) 5 MG tablet Take 5 mg by mouth daily.     valACYclovir (VALTREX) 500 MG tablet Take 500 mg by mouth daily as needed.     hydrocortisone-pramoxine (ANALPRAM-HC) 2.5-1 % rectal cream PLACE 1 APPLICATION RECTALLY 3 (THREE) TIMES DAILY. (DRUG NOT COVERED)--STOCK SIZE 120 g 0   No facility-administered medications prior to visit.    Review of Systems  Constitutional:  Negative for chills, fever, malaise/fatigue and weight loss.  HENT:  Negative for hearing loss, sore throat and tinnitus.   Eyes:  Negative for blurred vision and double vision.  Respiratory:  Positive for cough. Negative for hemoptysis, sputum production, shortness of breath, wheezing and stridor.   Cardiovascular:  Negative for chest pain, palpitations, orthopnea, leg swelling and PND.  Gastrointestinal:  Negative for abdominal pain, constipation, diarrhea, heartburn, nausea and vomiting.  Genitourinary:  Negative for dysuria, hematuria and urgency.  Musculoskeletal:  Negative for joint pain and  myalgias.  Skin:  Negative for itching and rash.  Neurological:  Negative for dizziness, tingling, weakness and headaches.  Endo/Heme/Allergies:  Negative for environmental allergies. Does not bruise/bleed easily.  Psychiatric/Behavioral:  Negative for depression. The patient is not nervous/anxious and does not have insomnia.   All other systems reviewed and are negative.    Objective:  Physical Exam Vitals reviewed.  Constitutional:      General:  She is not in acute distress.    Appearance: She is well-developed.  HENT:     Head: Normocephalic and atraumatic.  Eyes:     General: No scleral icterus.    Conjunctiva/sclera: Conjunctivae normal.     Pupils: Pupils are equal, round, and reactive to light.  Neck:     Vascular: No JVD.     Trachea: No tracheal deviation.  Cardiovascular:     Rate and Rhythm: Normal rate and regular rhythm.     Heart sounds: Normal heart sounds. No murmur heard. Pulmonary:     Effort: Pulmonary effort is normal. No tachypnea, accessory muscle usage or respiratory distress.     Breath sounds: No stridor. No wheezing, rhonchi or rales.  Abdominal:     General: There is no distension.     Palpations: Abdomen is soft.     Tenderness: There is no abdominal tenderness.  Musculoskeletal:        General: No tenderness.     Cervical back: Neck supple.  Lymphadenopathy:     Cervical: No cervical adenopathy.  Skin:    General: Skin is warm and dry.     Capillary Refill: Capillary refill takes less than 2 seconds.     Findings: No rash.  Neurological:     Mental Status: She is alert and oriented to person, place, and time.  Psychiatric:        Behavior: Behavior normal.      Vitals:   09/03/22 1302  BP: 134/60  Pulse: 87  Temp: 98.4 F (36.9 C)  TempSrc: Oral  SpO2: 97%  Weight: 148 lb 12.8 oz (67.5 kg)  Height: '5\' 2"'$  (1.575 m)   97% on  LPM  RA BMI Readings from Last 3 Encounters:  09/03/22 27.22 kg/m  07/13/22 27.58 kg/m  06/06/22 27.54 kg/m   Wt Readings from Last 3 Encounters:  09/03/22 148 lb 12.8 oz (67.5 kg)  07/13/22 150 lb 12.8 oz (68.4 kg)  06/06/22 153 lb (69.4 kg)     CBC    Component Value Date/Time   WBC 6.5 05/17/2022 1016   RBC 4.29 05/17/2022 1016   HGB 13.0 05/17/2022 1016   HCT 39.2 05/17/2022 1016   PLT 173.0 05/17/2022 1016   MCV 91.4 05/17/2022 1016   MCH 31.0 08/31/2021 1352   MCHC 33.2 05/17/2022 1016   RDW 15.0 05/17/2022 1016   LYMPHSABS 2.1  05/17/2022 1016   MONOABS 1.0 05/17/2022 1016   EOSABS 0.4 05/17/2022 1016   BASOSABS 0.1 05/17/2022 1016      Chest Imaging: CT chest October 2022: No PE, there is some subtle parenchymal changes, possibly related to atelectasis but there is an obvious difference there on her CT imaging. The patient's images have been independently reviewed by me.    HRCT chest 01/26/2022: No evidence of interstitial lung disease, lung nodules stable, thyroid nodule. The patient's images have been independently reviewed by me.     Pulmonary Functions Testing Results:    Latest Ref Rng & Units 01/24/2022    3:54 PM  PFT  Results  FVC-Pre L 2.64   FVC-Predicted Pre % 91   FVC-Post L 2.74   FVC-Predicted Post % 95   Pre FEV1/FVC % % 72   Post FEV1/FCV % % 77   FEV1-Pre L 1.90   FEV1-Predicted Pre % 87   FEV1-Post L 2.10   DLCO uncorrected ml/min/mmHg 18.05   DLCO UNC% % 95   DLCO corrected ml/min/mmHg 18.34   DLCO COR %Predicted % 97   DLVA Predicted % 99   TLC L 4.75   TLC % Predicted % 96   RV % Predicted % 104    FeNO:   Pathology:   Echocardiogram:   Heart Catheterization:     Assessment & Plan:     ICD-10-CM   1. Need for immunization against influenza  Z23 Flu Vaccine QUAD High Dose(Fluad)    2. Severe persistent asthma with acute exacerbation  J45.51     3. Elevated IgE level  R76.8     4. Positive RAST testing  T78.49XA     5. Environmental and seasonal allergies  J30.89        Discussion:  71 year old female on infliximab infusion for rheumatoid arthritis, HRCT with no evidence of ILD, normal PFTs baseline shortness of breath related to persistent asthma symptoms positive RAST panel and IgE elevated.  Currently on triple therapy inhaler regimen and as well as Dupixent.  She has had 3 injection of Dupixent and is doing fabulous.  Plan: Continue Dupixent injections as she has seen dramatic improvement in symptomatology Continue Breztri daily Continue albuterol  as needed. Follow-up with [place] in 1 year or as needed.    Current Outpatient Medications:    acetaminophen (TYLENOL) 500 MG tablet, Take 1,000 mg by mouth every 6 (six) hours as needed for moderate pain., Disp: , Rfl:    albuterol (VENTOLIN HFA) 108 (90 Base) MCG/ACT inhaler, Inhale 2 puffs into the lungs every 6 (six) hours as needed for wheezing or shortness of breath., Disp: 8 g, Rfl: 6   amLODipine (NORVASC) 10 MG tablet, Take 10 mg by mouth daily. , Disp: , Rfl:    ascorbic acid (VITAMIN C) 1000 MG tablet, Take 1,000 mg by mouth daily., Disp: , Rfl:    aspirin EC 81 MG tablet, Take 81 mg by mouth daily. Swallow whole., Disp: , Rfl:    BREZTRI AEROSPHERE 160-9-4.8 MCG/ACT AERO, INHALE 2 PUFFS INTO THE LUNGS IN THE MORNING AND AT BEDTIME., Disp: 10.7 each, Rfl: 1   CANNABIDIOL PO, Take 2 capsules by mouth at bedtime., Disp: , Rfl:    Cholecalciferol (VITAMIN D-3) 125 MCG (5000 UT) TABS, Take 1 tablet by mouth daily., Disp: , Rfl:    Dupilumab (DUPIXENT) 300 MG/2ML SOPN, Inject 300 mg into the skin every 14 (fourteen) days., Disp: 12 mL, Rfl: 1   famotidine (PEPCID) 40 MG tablet, Take 1 tablet (40 mg total) by mouth at bedtime., Disp: 90 tablet, Rfl: 3   fexofenadine (ALLEGRA) 180 MG tablet, Take 180 mg by mouth daily as needed for allergies., Disp: , Rfl:    hydrochlorothiazide (HYDRODIURIL) 25 MG tablet, Take 25 mg by mouth daily., Disp: , Rfl:    inFLIXimab-abda (RENFLEXIS) 100 MG SOLR, Inject 1 each into the vein every 8 (eight) weeks., Disp: , Rfl:    ipratropium (ATROVENT) 0.06 % nasal spray, Place 2 sprays into both nostrils 2 (two) times daily., Disp: 15 mL, Rfl: 5   leflunomide (ARAVA) 20 MG tablet, 1 tablet, Disp: , Rfl:  losartan (COZAAR) 100 MG tablet, Take 100 mg by mouth daily., Disp: , Rfl:    Magnesium 250 MG TABS, Take by mouth., Disp: , Rfl:    melatonin 1 MG TABS tablet, Take 1 mg by mouth as needed., Disp: , Rfl:    metoprolol succinate (TOPROL-XL) 25 MG 24 hr tablet,  TAKE 0.5 TABLETS BY MOUTH DAILY. TAKE WITH OR IMMEDIATELY FOLLOWING A MEAL., Disp: 45 tablet, Rfl: 1   Multiple Vitamin (MULTIVITAMIN) tablet, Take 1 tablet by mouth daily., Disp: , Rfl:    MYRBETRIQ 25 MG TB24 tablet, TAKE 1 TABLET (25 MG TOTAL) BY MOUTH DAILY., Disp: 30 tablet, Rfl: 5   Omega-3 Fatty Acids (FISH OIL) 1200 MG CAPS, Take 1,200 mg by mouth 2 (two) times daily., Disp: , Rfl:    omeprazole (PRILOSEC) 40 MG capsule, TAKE 1 CAPSULE BY MOUTH DAILY BEFORE BREAKFAST. TAKE ONE PILL 20-30 MINUTES BEFORE BREAKFAST MEAL., Disp: 90 capsule, Rfl: 3   polycarbophil (FIBERCON) 625 MG tablet, Take 625 mg by mouth daily., Disp: , Rfl:    polyethylene glycol (MIRALAX / GLYCOLAX) 17 g packet, Take 17 g by mouth every Monday, Wednesday, and Friday., Disp: , Rfl:    rosuvastatin (CRESTOR) 5 MG tablet, Take 5 mg by mouth daily., Disp: , Rfl:    valACYclovir (VALTREX) 500 MG tablet, Take 500 mg by mouth daily as needed., Disp: , Rfl:    Garner Nash, DO [facility] 09/03/2022 1:43 PM

## 2022-09-03 NOTE — Patient Instructions (Signed)
Thank you for visiting Dr. Valeta Harms at [facility]. Today we recommend the following:  Stay on breztri and dupixent injections Follow up with [place] in one year  Return in about 1 year (around 09/04/2023) for with APP or Dr. Valeta Harms.    Please do your part to reduce the spread of COVID-19.

## 2022-09-10 ENCOUNTER — Encounter: Payer: Self-pay | Admitting: *Deleted

## 2022-09-10 DIAGNOSIS — H9313 Tinnitus, bilateral: Secondary | ICD-10-CM | POA: Diagnosis not present

## 2022-09-10 DIAGNOSIS — H903 Sensorineural hearing loss, bilateral: Secondary | ICD-10-CM | POA: Diagnosis not present

## 2022-09-12 DIAGNOSIS — M25511 Pain in right shoulder: Secondary | ICD-10-CM | POA: Diagnosis not present

## 2022-09-12 DIAGNOSIS — R531 Weakness: Secondary | ICD-10-CM | POA: Diagnosis not present

## 2022-09-12 DIAGNOSIS — M25611 Stiffness of right shoulder, not elsewhere classified: Secondary | ICD-10-CM | POA: Diagnosis not present

## 2022-09-19 DIAGNOSIS — R531 Weakness: Secondary | ICD-10-CM | POA: Diagnosis not present

## 2022-09-19 DIAGNOSIS — M25511 Pain in right shoulder: Secondary | ICD-10-CM | POA: Diagnosis not present

## 2022-09-19 DIAGNOSIS — M25611 Stiffness of right shoulder, not elsewhere classified: Secondary | ICD-10-CM | POA: Diagnosis not present

## 2022-09-21 DIAGNOSIS — M25611 Stiffness of right shoulder, not elsewhere classified: Secondary | ICD-10-CM | POA: Diagnosis not present

## 2022-09-21 DIAGNOSIS — M25511 Pain in right shoulder: Secondary | ICD-10-CM | POA: Diagnosis not present

## 2022-09-21 DIAGNOSIS — R531 Weakness: Secondary | ICD-10-CM | POA: Diagnosis not present

## 2022-09-24 DIAGNOSIS — M25611 Stiffness of right shoulder, not elsewhere classified: Secondary | ICD-10-CM | POA: Diagnosis not present

## 2022-09-24 DIAGNOSIS — M25511 Pain in right shoulder: Secondary | ICD-10-CM | POA: Diagnosis not present

## 2022-09-24 DIAGNOSIS — R531 Weakness: Secondary | ICD-10-CM | POA: Diagnosis not present

## 2022-09-26 DIAGNOSIS — M25611 Stiffness of right shoulder, not elsewhere classified: Secondary | ICD-10-CM | POA: Diagnosis not present

## 2022-09-26 DIAGNOSIS — M25511 Pain in right shoulder: Secondary | ICD-10-CM | POA: Diagnosis not present

## 2022-09-26 DIAGNOSIS — R531 Weakness: Secondary | ICD-10-CM | POA: Diagnosis not present

## 2022-09-27 DIAGNOSIS — Z79899 Other long term (current) drug therapy: Secondary | ICD-10-CM | POA: Diagnosis not present

## 2022-09-27 DIAGNOSIS — M0579 Rheumatoid arthritis with rheumatoid factor of multiple sites without organ or systems involvement: Secondary | ICD-10-CM | POA: Diagnosis not present

## 2022-10-03 DIAGNOSIS — M25611 Stiffness of right shoulder, not elsewhere classified: Secondary | ICD-10-CM | POA: Diagnosis not present

## 2022-10-03 DIAGNOSIS — R531 Weakness: Secondary | ICD-10-CM | POA: Diagnosis not present

## 2022-10-03 DIAGNOSIS — M25511 Pain in right shoulder: Secondary | ICD-10-CM | POA: Diagnosis not present

## 2022-10-08 DIAGNOSIS — M25511 Pain in right shoulder: Secondary | ICD-10-CM | POA: Diagnosis not present

## 2022-10-08 DIAGNOSIS — R531 Weakness: Secondary | ICD-10-CM | POA: Diagnosis not present

## 2022-10-08 DIAGNOSIS — M25611 Stiffness of right shoulder, not elsewhere classified: Secondary | ICD-10-CM | POA: Diagnosis not present

## 2022-10-10 DIAGNOSIS — R531 Weakness: Secondary | ICD-10-CM | POA: Diagnosis not present

## 2022-10-10 DIAGNOSIS — M25611 Stiffness of right shoulder, not elsewhere classified: Secondary | ICD-10-CM | POA: Diagnosis not present

## 2022-10-10 DIAGNOSIS — M25511 Pain in right shoulder: Secondary | ICD-10-CM | POA: Diagnosis not present

## 2022-10-16 DIAGNOSIS — M25511 Pain in right shoulder: Secondary | ICD-10-CM | POA: Diagnosis not present

## 2022-10-18 ENCOUNTER — Other Ambulatory Visit: Payer: Self-pay | Admitting: Pulmonary Disease

## 2022-10-19 ENCOUNTER — Encounter: Payer: Self-pay | Admitting: Pulmonary Disease

## 2022-10-19 DIAGNOSIS — M75121 Complete rotator cuff tear or rupture of right shoulder, not specified as traumatic: Secondary | ICD-10-CM | POA: Diagnosis not present

## 2022-10-20 ENCOUNTER — Encounter: Payer: Self-pay | Admitting: Obstetrics and Gynecology

## 2022-10-23 NOTE — Telephone Encounter (Signed)
Please advise. Thanks.  

## 2022-10-26 NOTE — Telephone Encounter (Signed)
Patient states [place] sent surgical clearance to our office. Patient phone number is [phone].

## 2022-10-30 ENCOUNTER — Other Ambulatory Visit: Payer: Self-pay | Admitting: Physician Assistant

## 2022-11-06 ENCOUNTER — Telehealth: Payer: Self-pay | Admitting: Pulmonary Disease

## 2022-11-06 NOTE — Telephone Encounter (Signed)
Fax received from Dr. Tania Ade with [facility] to perform a RIGHT REVERSE TOTAL SHOULDER ARTHROPLASTY on patient.  Patient needs surgery clearance. Surgery is PENDING. Patient was seen on 09/03/2022. Office protocol is a risk assessment can be sent to surgeon if patient has been seen in 60 days or less.   Sending to Dr Valeta Harms for risk assessment or recommendations if patient needs to be seen in office prior to surgical procedure.

## 2022-11-06 NOTE — Telephone Encounter (Signed)
April, please advise on pt's latest message. Thanks.

## 2022-11-08 DIAGNOSIS — M0579 Rheumatoid arthritis with rheumatoid factor of multiple sites without organ or systems involvement: Secondary | ICD-10-CM | POA: Diagnosis not present

## 2022-11-08 NOTE — Telephone Encounter (Signed)
Called and spoke to Courtney Crane to give her an update to let her know that I am just waiting on Dr Valeta Harms to respond back to me in regards to patients surgical clearance. She is making a note in her chart for this patient on her end as well. Patient has called multiple times and mychart messages about surgical clearance. But there is nothing I can do but wait for Dr Fabio Bering response.

## 2022-11-09 NOTE — Telephone Encounter (Signed)
OV notes and clearance form have been faxed back to [facility]. Nothing further needed at this time.  

## 2022-11-14 DIAGNOSIS — M75121 Complete rotator cuff tear or rupture of right shoulder, not specified as traumatic: Secondary | ICD-10-CM | POA: Diagnosis not present

## 2022-11-14 DIAGNOSIS — M25511 Pain in right shoulder: Secondary | ICD-10-CM | POA: Diagnosis not present

## 2022-11-22 NOTE — Progress Notes (Signed)
Sent message, via epic in basket, requesting orders in epic from surgeon.  

## 2022-11-26 NOTE — Care Plan (Signed)
Ortho Bundle Case Management Note  Patient Details  Name: Courtney Crane MRN: [id number] Date of Birth: June 27, 1951  Patient will discharge to home with family to assist. No DME or therapy needs at this time. Post op therapy will be arranged from the office at 6 week visit. Patient and MD in agreement with plan. Choice offerred.                  DME Arranged:    DME Agency:     HH Arranged:    HH Agency:     Additional Comments: Please contact me with any questions of if this plan should need to change.  Ladell Heads,  [facility]ist  [phone] 11/26/2022, 11:46 AM

## 2022-11-27 ENCOUNTER — Other Ambulatory Visit: Payer: Self-pay | Admitting: Orthopedic Surgery

## 2022-11-27 NOTE — Progress Notes (Signed)
Second request for pre op orders: Spoke with Bethena Roys at Dr. Bettina Gavia office

## 2022-11-28 NOTE — Patient Instructions (Signed)
SURGICAL WAITING ROOM VISITATION Patients having surgery or a procedure may have no more than 2 support people in the waiting area - these visitors may rotate in the visitor waiting room.   Due to an increase in RSV and influenza rates and associated hospitalizations, children ages 49 and under may not visit patients in [place]. If the patient needs to stay at the hospital during part of their recovery, the visitor guidelines for inpatient rooms apply.  PRE-OP VISITATION  Pre-op nurse will coordinate an appropriate time for 1 support person to accompany the patient in pre-op.  This support person may not rotate.  This visitor will be contacted when the time is appropriate for the visitor to come back in the pre-op area.  Please refer to the [facility] website for the visitor guidelines for Inpatients (after your surgery is over and you are in a regular room).  You are not required to quarantine at this time prior to your surgery. However, you must do this: Hand Hygiene often Do NOT share personal items Notify your provider if you are in close contact with someone who has COVID or you develop fever 100.4 or greater, new onset of sneezing, cough, sore throat, shortness of breath or body aches.  If you test positive for Covid or have been in contact with anyone that has tested positive in the last 10 days please notify you surgeon.    Your procedure is scheduled on:  Thursday   December 06, 2022  Report to [facility] Main Entrance: [place] entrance where the [employer] is available.   Report to admitting at:  09:30   AM  +++++Call this number if you have any questions or problems the morning of surgery [phone]  Do not eat food after Midnight the night prior to your surgery/procedure.  After Midnight you may have the following liquids until  09:00 AM  DAY OF SURGERY  Clear Liquid Diet Water Black Coffee (sugar ok, NO MILK/CREAM OR CREAMERS)  Tea (sugar ok,  NO MILK/CREAM OR CREAMERS) regular and decaf                             Plain Jell-O  with no fruit (NO RED)                                           Fruit ices (not with fruit pulp, NO RED)                                     Popsicles (NO RED)                                                                  Juice: apple, WHITE grape, WHITE cranberry Sports drinks like Gatorade or Powerade (NO RED)                    The day of surgery:  Drink ONE (1) Pre-Surgery Clear Ensure at   09:00  AM the morning of surgery.  Drink in one sitting. Do not sip.  This drink was given to you during your hospital pre-op appointment visit. Nothing else to drink after completing the Pre-Surgery Clear Ensure : No candy, chewing gum or throat lozenges.    FOLLOW  ANY ADDITIONAL PRE OP INSTRUCTIONS YOU RECEIVED FROM YOUR SURGEON'S OFFICE!!!   Oral Hygiene is also important to reduce your risk of infection.        Remember - BRUSH YOUR TEETH THE MORNING OF SURGERY WITH YOUR REGULAR TOOTHPASTE  Do NOT smoke after Midnight the night before surgery.  Take ONLY these medicines the morning of surgery with A SIP OF WATER: Myrbetric,  ??? You may use your Breztri Inhaler,.   If You have been diagnosed with Sleep Apnea - Bring CPAP mask and tubing day of surgery. We will provide you with a CPAP machine on the day of your surgery.                   You may not have any metal on your body including hair pins, jewelry, and body piercing  Do not wear make-up, lotions, powders, perfumes or deodorant  Do not wear nail polish including gel and S&S, artificial / acrylic nails, or any other type of covering on natural nails including finger and toenails. If you have artificial nails, gel coating, etc., that needs to be removed by a nail salon, Please have this removed prior to surgery. Not doing so may mean that your surgery could be cancelled or delayed if the Surgeon or anesthesia staff feels like they are unable to  monitor you safely.   Do not shave 48 hours prior to surgery to avoid nicks in your skin which may contribute to postoperative infections.   Contacts, Hearing Aids, dentures or bridgework may not be worn into surgery. DENTURES WILL BE REMOVED PRIOR TO SURGERY PLEASE DO NOT APPLY "Poly grip" OR ADHESIVES!!!  Patients discharged on the day of surgery will not be allowed to drive home.  Someone NEEDS to stay with you for the first 24 hours after anesthesia.  Do not bring your home medications to the hospital. The Pharmacy will dispense medications listed on your medication list to you during your admission in the Hospital.  Special Instructions: Bring a copy of your healthcare power of attorney and living will documents the day of surgery, if you wish to have them scanned into your [facility] Medical Records- EPIC  Please read over the following fact sheets you were given: IF YOU HAVE QUESTIONS ABOUT YOUR PRE-OP INSTRUCTIONS, PLEASE CALL [phone]  (Ali Molina)   [place] - Preparing for Surgery Before surgery, you can play an important role.  Because skin is not sterile, your skin needs to be as free of germs as possible.  You can reduce the number of germs on your skin by washing with CHG (chlorahexidine gluconate) soap before surgery.  CHG is an antiseptic cleaner which kills germs and bonds with the skin to continue killing germs even after washing. Please DO NOT use if you have an allergy to CHG or antibacterial soaps.  If your skin becomes reddened/irritated stop using the CHG and inform your nurse when you arrive at Short Stay. Do not shave (including legs and underarms) for at least 48 hours prior to the first CHG shower.  You may shave your face/neck.  Please follow these instructions carefully:  1.  Shower with CHG Soap the night before surgery and the  morning of surgery.  2.  If you choose to wash your hair, wash your hair first as usual with your normal  shampoo.  3.  After you  shampoo, rinse your hair and body thoroughly to remove the shampoo.                             4.  Use CHG as you would any other liquid soap.  You can apply chg directly to the skin and wash.  Gently with a scrungie or clean washcloth.  5.  Apply the CHG Soap to your body ONLY FROM THE NECK DOWN.   Do not use on face/ open                           Wound or open sores. Avoid contact with eyes, ears mouth and genitals (private parts).                       Wash face,  Genitals (private parts) with your normal soap.             6.  Wash thoroughly, paying special attention to the area where your  surgery  will be performed.  7.  Thoroughly rinse your body with warm water from the neck down.  8.  DO NOT shower/wash with your normal soap after using and rinsing off the CHG Soap.            9.  Pat yourself dry with a clean towel.            10.  Wear clean pajamas.            11.  Place clean sheets on your bed the night of your first shower and do not  sleep with pets.  ON THE DAY OF SURGERY : Do not apply any lotions/deodorants the morning of surgery.  Please wear clean clothes to the hospital/surgery center.   Preparing for Total Shoulder Arthroplasty ================================================================= Please follow these instructions carefully, in addition to any other special Bathing information that was explained to you at the Presurgical Appointment:  BENZOYL PEROXIDE 5% GEL: Used to kill bacteria on the skin which could cause an infection at the surgery site.   Please do not use if you have an allergy to benzoyl peroxide. If your skin becomes reddened/irritated stop using the benzoyl peroxide and inform your Doctor.   Starting two days before surgery, apply as follows:  1. Apply benzoyl peroxide gel in the morning and at night. Apply after taking a shower. If you are not taking a shower, clean entire shoulder front, back, and side, along with the armpit with a clean wet  washcloth.  2. Place a quarter-sized dollop of the gel on your SHOULDER and rub in thoroughly, making sure to cover the front, back, and side of your shoulder, along with the armpit.   2 Days prior to Surgery     Tuesday  12-04-2022 First Dose  _______ Morning Second Dose  _______ Night  Day Before Surgery        Wednesday  12-05-2022 First Dose  ______ Morning  On the night before surgery, wash your entire body (except hair, face and private areas) with CHG Soap. THEN, rub in the LAST application of the Benzoyl Peroxide Gel on your shoulder.   3. On the Morning of Surgery wash your BODY AGAIN with CHG Soap (except hair, face and  private areas)  4. DO NOT USE THE BENZOYL PEROXIDE GEL ON THE DAY OF YOUR SURGERY      FAILURE TO FOLLOW THESE INSTRUCTIONS MAY RESULT IN THE CANCELLATION OF YOUR SURGERY  PATIENT SIGNATURE_________________________________  NURSE SIGNATURE__________________________________  ________________________________________________________________________          Adam Phenix    An incentive spirometer is a tool that can help keep your lungs clear and active. This tool measures how well you are filling your lungs with each breath. Taking long deep breaths may help reverse or decrease the chance of developing breathing (pulmonary) problems (especially infection) following: A long period of time when you are unable to move or be active. BEFORE THE PROCEDURE  If the spirometer includes an indicator to show your best effort, your nurse or respiratory therapist will set it to a desired goal. If possible, sit up straight or lean slightly forward. Try not to slouch. Hold the incentive spirometer in an upright position. INSTRUCTIONS FOR USE  Sit on the edge of your bed if possible, or sit up as far as you can in bed or on a chair. Hold the incentive spirometer in an upright position. Breathe out normally. Place the mouthpiece in your mouth and seal  your lips tightly around it. Breathe in slowly and as deeply as possible, raising the piston or the ball toward the top of the column. Hold your breath for 3-5 seconds or for as long as possible. Allow the piston or ball to fall to the bottom of the column. Remove the mouthpiece from your mouth and breathe out normally. Rest for a few seconds and repeat Steps 1 through 7 at least 10 times every 1-2 hours when you are awake. Take your time and take a few normal breaths between deep breaths. The spirometer may include an indicator to show your best effort. Use the indicator as a goal to work toward during each repetition. After each set of 10 deep breaths, practice coughing to be sure your lungs are clear. If you have an incision (the cut made at the time of surgery), support your incision when coughing by placing a pillow or rolled up towels firmly against it. Once you are able to get out of bed, walk around indoors and cough well. You may stop using the incentive spirometer when instructed by your caregiver.  RISKS AND COMPLICATIONS Take your time so you do not get dizzy or light-headed. If you are in pain, you may need to take or ask for pain medication before doing incentive spirometry. It is harder to take a deep breath if you are having pain. AFTER USE Rest and breathe slowly and easily. It can be helpful to keep track of a log of your progress. Your caregiver can provide you with a simple table to help with this. If you are using the spirometer at home, follow these instructions: [place] IF:  You are having difficultly using the spirometer. You have trouble using the spirometer as often as instructed. Your pain medication is not giving enough relief while using the spirometer. You develop fever of 100.5 F (38.1 C) or higher.  SEEK IMMEDIATE MEDICAL CARE IF:  You cough up bloody  sputum that had not been present before. You develop fever of 102 F (38.9 C) or greater. You develop worsening pain at or near the incision site. MAKE SURE YOU:  Understand these instructions. Will watch your condition. Will get help right away if you are not doing well or get worse. Document Released: 03/18/2007 Document Revised: 01/28/2012 Document Reviewed: 05/19/2007 [facility] Patient Information 2014 [place], [place].

## 2022-11-28 NOTE — Progress Notes (Signed)
COVID Vaccine received:  _0  No _1  Yes Date of any COVID positive Test in last 90 days:  PCP - Dr. Velna Hatchet Cardiologist [facility] Cardiology, saw [place], [place] 01-17-2022 Pulmonology- Marliss Coots, DO   [facility] phone note 11-06-2022 Rheumatology- Gavin Pound, MD  Chest x-ray - 05-17-2022  Epic EKG -  01-17-2022  Epic Stress Test - lexiscan  10-10-21 Epic ECHO - 10-10-2021  epic Cardiac Cath -   PCR screen: _2  Ordered & Completed                      _3   No Order but Needs PROFEND                      _4   N/A for this surgery  Surgery Plan:  _5  Ambulatory                            _6  Outpatient in bed                            _7  Admit  Anesthesia:    _8  General  _9  Spinal                           _10   Choice _11   MAC  Pacemaker / ICD device _12  No _13  Yes        Device order form faxed _14  No    _15   Yes      Faxed to:  Spinal Cord Stimulator:_16  No _17  Yes      (Remind patient to bring remote DOS) Other Implants:   History of Sleep Apnea? _18  No _19  Yes   CPAP used?- _20  No _21  Yes    Does the patient monitor blood sugar? _22  No _23  Yes  _24  N/A  Blood Thinner / Instructions: none Aspirin Instructions:  none  ERAS Protocol Ordered: _25  No  _26  Yes PRE-SURGERY _27  ENSURE  _28  G2  Patient is to be NPO after: 09:00  Comments:   Activity level: Patient can / can not climb a flight of stairs without difficulty; _29  No CP  _30  No SOB, but would have ______   Patient can / can not perform ADLs without assistance.   Anesthesia review: HTN, Anemia, Anxiety, "difficult to wake up", RA, DOE, claudication, hx abn EKG 12-01-2020 - poor R wave progression  Patient denies shortness of breath, fever, cough and chest pain at PAT appointment.  Patient verbalized understanding and agreement to the Pre-Surgical Instructions that were given to them at this PAT appointment. Patient was also educated of the need to review these PAT instructions again prior to his/her  surgery.I reviewed the appropriate phone numbers to call if they have any and questions or concerns.

## 2022-11-29 ENCOUNTER — Encounter (HOSPITAL_COMMUNITY)
Admission: RE | Admit: 2022-11-29 | Discharge: 2022-11-29 | Disposition: A | Discharge status: Home / Self Care | Payer: PPO | Source: Ambulatory Visit | Attending: Anesthesiology | Admitting: Anesthesiology

## 2022-11-29 ENCOUNTER — Ambulatory Visit: Payer: PPO | Admitting: Allergy

## 2022-11-29 DIAGNOSIS — Z01818 Encounter for other preprocedural examination: Secondary | ICD-10-CM

## 2022-11-29 DIAGNOSIS — I10 Essential (primary) hypertension: Secondary | ICD-10-CM

## 2022-11-30 NOTE — Patient Instructions (Addendum)
SURGICAL WAITING ROOM VISITATION  Patients having surgery or a procedure may have no more than 2 support people in the waiting area - these visitors may rotate.    Children under the age of 38 must have an adult with them who is not the patient.  Due to an increase in RSV and influenza rates and associated hospitalizations, children ages 55 and under may not visit patients in [place].  If the patient needs to stay at the hospital during part of their recovery, the visitor guidelines for inpatient rooms apply. Pre-op nurse will coordinate an appropriate time for 1 support person to accompany patient in pre-op.  This support person may not rotate.    Please refer to the [facility] website for the visitor guidelines for Inpatients (after your surgery is over and you are in a regular room).       Your procedure is scheduled on:  12/06/2022    Report to [facility] Main Entrance    Report to admitting at  [place] AM   Call this number if you have problems the morning of surgery [phone]   Do not eat food :After Midnight.   After Midnight you may have the following liquids until __ 0645_ ___ AM DAY OF SURGERY  Water Non-Citrus Juices (without pulp, NO RED-Apple, White grape, White cranberry) Black Coffee (NO MILK/CREAM OR CREAMERS, sugar ok)  Clear Tea (NO MILK/CREAM OR CREAMERS, sugar ok) regular and decaf                             Plain Jell-O (NO RED)                                           Fruit ices (not with fruit pulp, NO RED)                                     Popsicles (NO RED)                                                               Sports drinks like Gatorade (NO RED)                     The day of surgery:  Drink ONE (1) Pre-Surgery Clear Ensure or G2 at    [phone]  ( have completed by )  the morning of surgery. Drink in one sitting. Do not sip.  This drink was given to you during your hospital  pre-op appointment visit. Nothing else  to drink after completing the  Pre-Surgery Clear Ensure or G2.          If you have questions, please contact your surgeon's office.      Oral Hygiene is also important to reduce your risk of infection.                                    Remember - BRUSH YOUR TEETH THE MORNING  OF SURGERY WITH YOUR REGULAR TOOTHPASTE  DENTURES WILL BE REMOVED PRIOR TO SURGERY PLEASE DO NOT APPLY "Poly grip" OR ADHESIVES!!!   Do NOT smoke after Midnight   Take these medicines the morning of surgery with A SIP OF WATER:  inhalers as usual and bring, amlodipine, allegra, myrbetriq, omeprazole   DO NOT TAKE ANY ORAL DIABETIC MEDICATIONS DAY OF YOUR SURGERY  Bring CPAP mask and tubing day of surgery.                              You may not have any metal on your body including hair pins, jewelry, and body piercing             Do not wear make-up, lotions, powders, perfumes/cologne, or deodorant  Do not wear nail polish including gel and S&S, artificial/acrylic nails, or any other type of covering on natural nails including finger and toenails. If you have artificial nails, gel coating, etc. that needs to be removed by a nail salon please have this removed prior to surgery or surgery may need to be canceled/ delayed if the surgeon/ anesthesia feels like they are unable to be safely monitored.   Do not shave  48 hours prior to surgery.               Men may shave face and neck.   Do not bring valuables to the hospital. [place].   Contacts, glasses, dentures or bridgework may not be worn into surgery.   Bring small overnight bag day of surgery.   DO NOT [place]. PHARMACY WILL DISPENSE MEDICATIONS LISTED ON YOUR MEDICATION LIST TO YOU DURING YOUR ADMISSION [place]!    Patients discharged on the day of surgery will not be allowed to drive home.  Someone NEEDS to stay with you for the first 24 hours after  anesthesia.   Special Instructions: Bring a copy of your healthcare power of attorney and living will documents the day of surgery if you haven't scanned them before.              Please read over the following fact sheets you were given: IF [place] [phone]   If you received a COVID test during your pre-op visit  it is requested that you wear a mask when out in public, stay away from anyone that may not be feeling well and notify your surgeon if you develop symptoms. If you test positive for Covid or have been in contact with anyone that has tested positive in the last 10 days please notify you surgeon.    [place] - Preparing for Surgery Before surgery, you can play an important role.  Because skin is not sterile, your skin needs to be as free of germs as possible.  You can reduce the number of germs on your skin by washing with CHG (chlorahexidine gluconate) soap before surgery.  CHG is an antiseptic cleaner which kills germs and bonds with the skin to continue killing germs even after washing. Please DO NOT use if you have an allergy to CHG or antibacterial soaps.  If your skin becomes reddened/irritated stop using the CHG and inform your nurse when you arrive at Short Stay. Do not shave (including legs and underarms)  for at least 48 hours prior to the first CHG shower.  You may shave your face/neck. Please follow these instructions carefully:  1.  Shower with CHG Soap the night before surgery and the  morning of Surgery.  2.  If you choose to wash your hair, wash your hair first as usual with your  normal  shampoo.  3.  After you shampoo, rinse your hair and body thoroughly to remove the  shampoo.                           4.  Use CHG as you would any other liquid soap.  You can apply chg directly  to the skin and wash                       Gently with a scrungie or clean washcloth.  5.  Apply the CHG Soap to your body ONLY FROM THE  NECK DOWN.   Do not use on face/ open                           Wound or open sores. Avoid contact with eyes, ears mouth and genitals (private parts).                       Wash face,  Genitals (private parts) with your normal soap.             6.  Wash thoroughly, paying special attention to the area where your surgery  will be performed.  7.  Thoroughly rinse your body with warm water from the neck down.  8.  DO NOT shower/wash with your normal soap after using and rinsing off  the CHG Soap.                9.  Pat yourself dry with a clean towel.            10.  Wear clean pajamas.            11.  Place clean sheets on your bed the night of your first shower and do not  sleep with pets. Day of Surgery : Do not apply any lotions/deodorants the morning of surgery.  Please wear clean clothes to the hospital/surgery center.  FAILURE TO FOLLOW THESE INSTRUCTIONS MAY RESULT IN THE CANCELLATION OF YOUR SURGERY PATIENT SIGNATURE_________________________________  NURSE SIGNATURE__________________________________  ________________________________________________________________________ [facility]- Preparing for Total Shoulder Arthroplasty    Before surgery, you can play an important role. Because skin is not sterile, your skin needs to be as free of germs as possible. You can reduce the number of germs on your skin by using the following products. Benzoyl Peroxide Gel Reduces the number of germs present on the skin Applied twice a day to shoulder area starting two days before surgery    ==================================================================  Please follow these instructions carefully:  BENZOYL PEROXIDE 5% GEL  Please do not use if you have an allergy to benzoyl peroxide.   If your skin becomes reddened/irritated stop using the benzoyl peroxide.  Starting two days before surgery, apply as follows: Apply benzoyl peroxide in the morning and at night. Apply after taking a shower.  If you are not taking a shower clean entire shoulder front, back, and side along with the armpit with a clean wet washcloth.  Place a quarter-sized dollop on your shoulder and rub in  thoroughly, making sure to cover the front, back, and side of your shoulder, along with the armpit.   2 days before ____ AM   ____ PM              1 day before ____ AM   ____ PM                         Do this twice a day for two days.  (Last application is the night before surgery, AFTER using the CHG soap as described below).  Do NOT apply benzoyl peroxide gel on the day of surgery.

## 2022-11-30 NOTE — Progress Notes (Signed)
Anesthesia Review:  PCP: Cardiologist : Pulmonology- DR Icard- [facility]  Chest x-ray : 05/17/22- 2 view CT chest- 01/26/22  EKG : 01/17/22  PFT- 01/24/22  Echo : 10/13/21  Stress test: 10/10/21 Cardiac Cath :  Activity level:  Sleep Study/ CPAP : Fasting Blood Sugar :      / Checks Blood Sugar -- times a day:   Blood Thinner/ Instructions /Last Dose: ASA / Instructions/ Last Dose :

## 2022-12-04 ENCOUNTER — Other Ambulatory Visit: Payer: Self-pay

## 2022-12-04 ENCOUNTER — Encounter (HOSPITAL_COMMUNITY)
Admission: RE | Admit: 2022-12-04 | Discharge: 2022-12-04 | Disposition: A | Discharge status: Home / Self Care | Payer: PPO | Source: Ambulatory Visit | Attending: Orthopedic Surgery | Admitting: Orthopedic Surgery

## 2022-12-04 ENCOUNTER — Encounter (HOSPITAL_COMMUNITY): Payer: Self-pay

## 2022-12-04 ENCOUNTER — Ambulatory Visit (HOSPITAL_COMMUNITY)
Admission: RE | Admit: 2022-12-04 | Discharge: 2022-12-04 | Disposition: A | Discharge status: Home / Self Care | Payer: PPO | Source: Ambulatory Visit | Attending: Orthopedic Surgery | Admitting: Orthopedic Surgery

## 2022-12-04 VITALS — BP 123/75 | HR 77 | Temp 98.6°F | Resp 16 | Ht 62.0 in | Wt 136.0 lb

## 2022-12-04 DIAGNOSIS — Z01818 Encounter for other preprocedural examination: Secondary | ICD-10-CM

## 2022-12-04 DIAGNOSIS — F419 Anxiety disorder, unspecified: Secondary | ICD-10-CM | POA: Diagnosis not present

## 2022-12-04 DIAGNOSIS — I10 Essential (primary) hypertension: Secondary | ICD-10-CM | POA: Diagnosis not present

## 2022-12-04 DIAGNOSIS — M069 Rheumatoid arthritis, unspecified: Secondary | ICD-10-CM | POA: Diagnosis not present

## 2022-12-04 DIAGNOSIS — N3281 Overactive bladder: Secondary | ICD-10-CM | POA: Diagnosis not present

## 2022-12-04 DIAGNOSIS — E785 Hyperlipidemia, unspecified: Secondary | ICD-10-CM | POA: Diagnosis not present

## 2022-12-04 DIAGNOSIS — F330 Major depressive disorder, recurrent, mild: Secondary | ICD-10-CM | POA: Diagnosis not present

## 2022-12-04 DIAGNOSIS — D84821 Immunodeficiency due to drugs: Secondary | ICD-10-CM | POA: Diagnosis not present

## 2022-12-04 DIAGNOSIS — J45909 Unspecified asthma, uncomplicated: Secondary | ICD-10-CM | POA: Insufficient documentation

## 2022-12-04 DIAGNOSIS — M75101 Unspecified rotator cuff tear or rupture of right shoulder, not specified as traumatic: Secondary | ICD-10-CM | POA: Insufficient documentation

## 2022-12-04 DIAGNOSIS — E1169 Type 2 diabetes mellitus with other specified complication: Secondary | ICD-10-CM | POA: Diagnosis not present

## 2022-12-04 LAB — BASIC METABOLIC PANEL
Anion gap: 11 (ref 5–15)
BUN: 16 mg/dL (ref 8–23)
CO2: 24 mmol/L (ref 22–32)
Calcium: 9.3 mg/dL (ref 8.9–10.3)
Chloride: 101 mmol/L (ref 98–111)
Creatinine, Ser: 0.92 mg/dL (ref 0.44–1.00)
GFR, Estimated: >60 mL/min (ref >60)
Glucose, Bld: 114 mg/dL — ABNORMAL HIGH (ref 70–99)
Potassium: 3.6 mmol/L (ref 3.5–5.1)
Sodium: 136 mmol/L (ref 135–145)

## 2022-12-04 LAB — SURGICAL PCR SCREEN
MRSA, PCR: NEGATIVE
Staphylococcus aureus: NEGATIVE

## 2022-12-04 LAB — CBC
HCT: 39.9 % (ref 36.0–46.0)
Hemoglobin: 13.2 g/dL (ref 12.0–15.0)
MCH: 32.3 pg (ref 26.0–34.0)
MCHC: 33.1 g/dL (ref 30.0–36.0)
MCV: 97.6 fL (ref 80.0–100.0)
Platelets: 166 10*3/uL (ref 150–400)
RBC: 4.09 MIL/uL (ref 3.87–5.11)
RDW: 13.1 % (ref 11.5–15.5)
WBC: 6.2 10*3/uL (ref 4.0–10.5)
nRBC: 0 % (ref 0.0–0.2)

## 2022-12-05 NOTE — Anesthesia Preprocedure Evaluation (Addendum)
Anesthesia Evaluation  Patient identified by MRN, date of birth, ID band Patient awake    Reviewed: Allergy & Precautions, NPO status , Patient's Chart, lab work & pertinent test results, reviewed documented beta blocker date and time   History of Anesthesia Complications (+) PONV and history of anesthetic complications  Airway Mallampati: II  TM Distance: >3 FB Neck ROM: Full    Dental no notable dental hx.    Pulmonary asthma    Pulmonary exam normal        Cardiovascular hypertension, Pt. on medications and Pt. on home beta blockers Normal cardiovascular exam     Neuro/Psych   Anxiety        GI/Hepatic ,GERD  Medicated,,  Endo/Other    Renal/GU      Musculoskeletal  (+) Arthritis , Rheumatoid disorders,  RIGHT SHOULDER IRRPAIRABLE ROTATOR CUFF TEAR   Abdominal   Peds  Hematology   Anesthesia Other Findings   Reproductive/Obstetrics                              Anesthesia Physical Anesthesia Plan  ASA: 2  Anesthesia Plan: General   Post-op Pain Management: Regional block* and Tylenol PO (pre-op)*   Induction: Intravenous  PONV Risk Score and Plan: 4 or greater and Midazolam, Treatment may vary due to age or medical condition, Propofol infusion, Dexamethasone and Ondansetron  Airway Management Planned: Oral ETT  Additional Equipment: None  Intra-op Plan:   Post-operative Plan: Extubation in OR  Informed Consent: I have reviewed the patients History and Physical, chart, labs and discussed the procedure including the risks, benefits and alternatives for the proposed anesthesia with the patient or authorized representative who has indicated his/her understanding and acceptance.     Dental advisory given  Plan Discussed with: CRNA  Anesthesia Plan Comments:         Anesthesia Quick Evaluation

## 2022-12-05 NOTE — Progress Notes (Signed)
Anesthesia Chart Review   Case: [id number] Date/Time: 12/06/22 0935   Procedure: RIGHT REVERSE SHOULDER ARTHROPLASTY (Right: Shoulder)   Anesthesia type: Choice   Pre-op diagnosis: RIGHT SHOULDER IRRPAIRABLE ROTATOR CUFF TEAR   Location: [facility] ROOM 07 / [facility]   Surgeons: Tania Ade, MD       DISCUSSION:72 y.o. never smoker with h/o PONV, HTN, asthma, right shoulder rotator cuff tear scheduled for above procedure 12/06/2022 with Dr. Tania Ade.   Pt last seen by cardiology 01/17/2022. Stable at this visit with 1 year f/u recommended.   Per pulmonologist ok to proceed with surgery.   Anticipate pt can proceed with planned procedure barring acute status change.   VS: BP 123/75   Pulse 77   Temp 37 C (Oral)   Resp 16   Ht '5\' 2"'$  (1.575 m)   Wt 61.7 kg   SpO2 99%   BMI 24.87 kg/m   PROVIDERS: Velna Hatchet, MD is PCP   June Leap, DO is Pulmonologist  LABS: Labs reviewed: Acceptable for surgery. (all labs ordered are listed, but only abnormal results are displayed)  Labs Reviewed  BASIC METABOLIC PANEL - Abnormal; Notable for the following components:      Result Value   Glucose, Bld 114 (*)    All other components within normal limits  SURGICAL PCR SCREEN  CBC     IMAGES:   EKG:   CV:  Exercise nuclear stress test 10/09/2021:  Normal ECG stress. The patient exercised for 3 minutes and 30 seconds of a Bruce protocol, achieving approximately 4.64 METs. During exercise the patient developed chest pain, and experienced dyspnea and dizziness. Exercise was terminated due to fatigue/weakness.  The calculated Duke Treadmill Score is -0.5. Normal BP response.  Myocardial perfusion is normal. Diaphragmatic attenuation noted. Overall LV systolic function is normal without regional wall motion abnormalities. Stress LV EF: 66%.  No previous exam available for comparison. low risk.   Echocardiogram 10/10/2021:  Normal LV systolic function with visual EF 60-65%.  Left ventricle cavity  is normal in size. Mild left ventricular hypertrophy. Normal global wall  motion. Normal diastolic filling pattern, normal LAP.  No significant valvular heart disease.  Compared to study 05/19/2019 no significant change.  Past Medical History:  Diagnosis Date   Anemia    Anxiety    Arthritis    Asthma    Cataract    Colon polyps    Complication of anesthesia    "sometimes I dont like to wake up after " ie difficult to awaken    Diverticulitis    Diverticulosis    Fatty liver    GERD (gastroesophageal reflux disease)    HLD (hyperlipidemia)    HTN (hypertension)    IBS (irritable bowel syndrome)    Insomnia    MRSA infection    Osteoarthritis    Osteopenia    PONV (postoperative nausea and vomiting)    RA (rheumatoid arthritis) (HCC)    Rheumatoid arthritis (HCC)    Seasonal allergies    Shingles    Status post dilation of esophageal narrowing    Vitamin D deficiency     Past Surgical History:  Procedure Laterality Date   ADENOIDECTOMY     APPENDECTOMY  1975   bladder tack  1990   CATARACT EXTRACTION, BILATERAL Bilateral    CHOLECYSTECTOMY N/A 01/31/2017   Procedure: LAPAROSCOPIC CHOLECYSTECTOMY WITH INTRAOPERATIVE CHOLANGIOGRAM;  Surgeon: Armandina Gemma, MD;  Location: [facility];  Service: General;  Laterality: N/A;   INCISION AND  DRAINAGE PERIRECTAL ABSCESS N/A 12/02/2017   Procedure: IRRIGATION AND DEBRIDEMENT PERINEAL ABSCESS;  Surgeon: Greer Pickerel, MD;  Location: [facility];  Service: General;  Laterality: N/A;   LUMBAR FUSION     MENISCUS REPAIR Right 2013   ROTATOR CUFF REPAIR Right 2005   TONSILLECTOMY     as a child   TOTAL ABDOMINAL HYSTERECTOMY  1988   TOTAL KNEE ARTHROPLASTY Left    WRIST RECONSTRUCTION Right 2008    MEDICATIONS:  acetaminophen (TYLENOL) 500 MG tablet   albuterol (VENTOLIN HFA) 108 (90 Base) MCG/ACT inhaler   amLODipine (NORVASC) 10 MG tablet   aspirin EC 81 MG tablet   bismuth subsalicylate (PEPTO BISMOL) 262 MG  chewable tablet   BREZTRI AEROSPHERE 160-9-4.8 MCG/ACT AERO   Calcium Carb-Cholecalciferol (CALCIUM 600+D3 PO)   calcium elemental as carbonate (TUMS ULTRA 1000) 400 MG chewable tablet   CANNABIDIOL PO   Cholecalciferol (VITAMIN D-3) 125 MCG (5000 UT) TABS   Dupilumab (DUPIXENT) 300 MG/2ML SOPN   famotidine (PEPCID) 40 MG tablet   fexofenadine (ALLEGRA) 180 MG tablet   hydrochlorothiazide (HYDRODIURIL) 25 MG tablet   inFLIXimab-abda (RENFLEXIS) 100 MG SOLR   ipratropium (ATROVENT) 0.06 % nasal spray   leflunomide (ARAVA) 20 MG tablet   losartan (COZAAR) 100 MG tablet   Magnesium 250 MG TABS   MELATONIN PO   metoprolol succinate (TOPROL-XL) 25 MG 24 hr tablet   Multiple Vitamin (MULTIVITAMIN) tablet   Multiple Vitamins-Minerals (IMMUNE SUPPORT VITAMIN C PO)   MYRBETRIQ 25 MG TB24 tablet   Omega-3 Fatty Acids (FISH OIL) 1200 MG CAPS   omeprazole (PRILOSEC) 40 MG capsule   polyethylene glycol (MIRALAX / GLYCOLAX) 17 g packet   Psyllium (METAMUCIL 4 IN 1 FIBER PO)   rosuvastatin (CRESTOR) 5 MG tablet   simethicone (MYLICON) 264 MG chewable tablet   sodium chloride (OCEAN) 0.65 % SOLN nasal spray   traMADol (ULTRAM) 50 MG tablet   valACYclovir (VALTREX) 500 MG tablet   No current facility-administered medications for this encounter.     Konrad Felix Ward, PA-C [facility] [phone]

## 2022-12-06 ENCOUNTER — Ambulatory Visit (HOSPITAL_BASED_OUTPATIENT_CLINIC_OR_DEPARTMENT_OTHER): Payer: PPO | Admitting: Anesthesiology

## 2022-12-06 ENCOUNTER — Encounter (HOSPITAL_COMMUNITY)
Admission: RE | Disposition: A | Discharge status: Home / Self Care | Payer: Self-pay | Source: Home / Self Care | Attending: Orthopedic Surgery

## 2022-12-06 ENCOUNTER — Other Ambulatory Visit: Payer: Self-pay

## 2022-12-06 ENCOUNTER — Encounter (HOSPITAL_COMMUNITY): Payer: Self-pay | Admitting: Orthopedic Surgery

## 2022-12-06 ENCOUNTER — Ambulatory Visit (HOSPITAL_COMMUNITY)
Admission: RE | Admit: 2022-12-06 | Discharge: 2022-12-06 | Disposition: A | Discharge status: Home / Self Care | Payer: PPO | Attending: Orthopedic Surgery | Admitting: Orthopedic Surgery

## 2022-12-06 ENCOUNTER — Ambulatory Visit (HOSPITAL_COMMUNITY): Payer: PPO | Admitting: Physician Assistant

## 2022-12-06 DIAGNOSIS — M069 Rheumatoid arthritis, unspecified: Secondary | ICD-10-CM | POA: Insufficient documentation

## 2022-12-06 DIAGNOSIS — I10 Essential (primary) hypertension: Secondary | ICD-10-CM | POA: Diagnosis not present

## 2022-12-06 DIAGNOSIS — M75101 Unspecified rotator cuff tear or rupture of right shoulder, not specified as traumatic: Secondary | ICD-10-CM

## 2022-12-06 DIAGNOSIS — M25511 Pain in right shoulder: Secondary | ICD-10-CM

## 2022-12-06 DIAGNOSIS — Z96611 Presence of right artificial shoulder joint: Secondary | ICD-10-CM | POA: Insufficient documentation

## 2022-12-06 DIAGNOSIS — K219 Gastro-esophageal reflux disease without esophagitis: Secondary | ICD-10-CM | POA: Insufficient documentation

## 2022-12-06 DIAGNOSIS — Z01818 Encounter for other preprocedural examination: Secondary | ICD-10-CM

## 2022-12-06 DIAGNOSIS — J45909 Unspecified asthma, uncomplicated: Secondary | ICD-10-CM | POA: Insufficient documentation

## 2022-12-06 DIAGNOSIS — S46011A Strain of muscle(s) and tendon(s) of the rotator cuff of right shoulder, initial encounter: Secondary | ICD-10-CM | POA: Diagnosis not present

## 2022-12-06 DIAGNOSIS — G8918 Other acute postprocedural pain: Secondary | ICD-10-CM | POA: Diagnosis not present

## 2022-12-06 HISTORY — DX: Pain in right shoulder: M25.511

## 2022-12-06 HISTORY — PX: REVERSE SHOULDER ARTHROPLASTY: SHX5054

## 2022-12-06 SURGERY — ARTHROPLASTY, SHOULDER, TOTAL, REVERSE
Anesthesia: General | Site: Shoulder | Laterality: Right

## 2022-12-06 MED ORDER — SUGAMMADEX SODIUM 200 MG/2ML IV SOLN
INTRAVENOUS | Status: DC | PRN
  Administered 2022-12-06: 200 mg via INTRAVENOUS

## 2022-12-06 MED ORDER — CHLORHEXIDINE GLUCONATE 0.12 % MT SOLN: 15.0000 mL | Freq: Once | OROMUCOSAL | Status: DC

## 2022-12-06 MED ORDER — ORAL CARE MOUTH RINSE: 15.0000 mL | Freq: Once | OROMUCOSAL | Status: AC

## 2022-12-06 MED ORDER — ONDANSETRON HCL 4 MG PO TABS: 4.0000 mg | ORAL_TABLET | Freq: Four times a day (QID) | ORAL | Status: DC | PRN

## 2022-12-06 MED ORDER — FENTANYL CITRATE PF 50 MCG/ML IJ SOSY: 25.0000 ug | PREFILLED_SYRINGE | INTRAMUSCULAR | Status: DC | PRN

## 2022-12-06 MED ORDER — OXYCODONE HCL 5 MG PO TABS: 5.0000 mg | ORAL_TABLET | Freq: Once | ORAL | Status: DC | PRN

## 2022-12-06 MED ORDER — BUPIVACAINE-EPINEPHRINE (PF) 0.5% -1:200000 IJ SOLN
INTRAMUSCULAR | Status: DC | PRN
  Administered 2022-12-06: 15 mL via PERINEURAL

## 2022-12-06 MED ORDER — METOCLOPRAMIDE HCL 5 MG/ML IJ SOLN: 5.0000 mg | Freq: Three times a day (TID) | INTRAMUSCULAR | Status: DC | PRN

## 2022-12-06 MED ORDER — FENTANYL CITRATE PF 50 MCG/ML IJ SOSY: 50.0000 ug | PREFILLED_SYRINGE | INTRAMUSCULAR | Status: DC

## 2022-12-06 MED ORDER — LACTATED RINGERS IV SOLN: INTRAVENOUS | Status: DC

## 2022-12-06 MED ORDER — ACETAMINOPHEN 500 MG PO TABS: 500.0000 mg | ORAL_TABLET | Freq: Four times a day (QID) | ORAL | Status: DC

## 2022-12-06 MED ORDER — TRANEXAMIC ACID-NACL 1000-0.7 MG/100ML-% IV SOLN
1000.0000 mg | INTRAVENOUS | Status: AC
  Administered 2022-12-06: 1000 mg via INTRAVENOUS
  Filled 2022-12-06: qty 100

## 2022-12-06 MED ORDER — ROCURONIUM BROMIDE 100 MG/10ML IV SOLN
INTRAVENOUS | Status: DC | PRN
  Administered 2022-12-06: 50 mg via INTRAVENOUS

## 2022-12-06 MED ORDER — DEXAMETHASONE SODIUM PHOSPHATE 10 MG/ML IJ SOLN
INTRAMUSCULAR | Status: AC
  Filled 2022-12-06: qty 1

## 2022-12-06 MED ORDER — OXYCODONE-ACETAMINOPHEN 5-325 MG PO TABS: 1.0000 | ORAL_TABLET | Freq: Four times a day (QID) | ORAL | 0 refills | Status: DC | PRN

## 2022-12-06 MED ORDER — LACTATED RINGERS IV BOLUS
500.0000 mL | Freq: Once | INTRAVENOUS | Status: AC
  Administered 2022-12-06: 500 mL via INTRAVENOUS

## 2022-12-06 MED ORDER — LIDOCAINE HCL (CARDIAC) PF 100 MG/5ML IV SOSY
PREFILLED_SYRINGE | INTRAVENOUS | Status: DC | PRN
  Administered 2022-12-06: 60 mg via INTRAVENOUS

## 2022-12-06 MED ORDER — CEFAZOLIN SODIUM-DEXTROSE 2-4 GM/100ML-% IV SOLN
2.0000 g | INTRAVENOUS | Status: AC
  Administered 2022-12-06: 2 g via INTRAVENOUS
  Filled 2022-12-06: qty 100

## 2022-12-06 MED ORDER — DEXAMETHASONE SODIUM PHOSPHATE 10 MG/ML IJ SOLN
INTRAMUSCULAR | Status: DC | PRN
  Administered 2022-12-06: 4 mg via INTRAVENOUS

## 2022-12-06 MED ORDER — PHENYLEPHRINE HCL (PRESSORS) 10 MG/ML IV SOLN
INTRAVENOUS | Status: DC | PRN
  Administered 2022-12-06: 160 ug via INTRAVENOUS
  Administered 2022-12-06 (×2): 100 ug via INTRAVENOUS

## 2022-12-06 MED ORDER — PHENYLEPHRINE HCL-NACL 20-0.9 MG/250ML-% IV SOLN
INTRAVENOUS | Status: AC
  Filled 2022-12-06: qty 250

## 2022-12-06 MED ORDER — CHLORHEXIDINE GLUCONATE 0.12 % MT SOLN
15.0000 mL | Freq: Once | OROMUCOSAL | Status: AC
  Administered 2022-12-06: 15 mL via OROMUCOSAL

## 2022-12-06 MED ORDER — LACTATED RINGERS IV BOLUS
250.0000 mL | Freq: Once | INTRAVENOUS | Status: AC
  Administered 2022-12-06: 250 mL via INTRAVENOUS

## 2022-12-06 MED ORDER — PROPOFOL 10 MG/ML IV BOLUS
INTRAVENOUS | Status: AC
  Filled 2022-12-06: qty 20

## 2022-12-06 MED ORDER — SODIUM CHLORIDE 0.9 % IR SOLN
Status: DC | PRN
  Administered 2022-12-06: 1000 mL

## 2022-12-06 MED ORDER — WATER FOR IRRIGATION, STERILE IR SOLN
Status: DC | PRN
  Administered 2022-12-06: 2000 mL

## 2022-12-06 MED ORDER — METHOCARBAMOL 500 MG IVPB - SIMPLE MED: 500.0000 mg | Freq: Four times a day (QID) | INTRAVENOUS | Status: DC | PRN

## 2022-12-06 MED ORDER — HYDROCODONE-ACETAMINOPHEN 5-325 MG PO TABS: 1.0000 | ORAL_TABLET | ORAL | Status: DC | PRN

## 2022-12-06 MED ORDER — ONDANSETRON HCL 4 MG/2ML IJ SOLN
INTRAMUSCULAR | Status: AC
  Filled 2022-12-06: qty 2

## 2022-12-06 MED ORDER — OXYCODONE HCL 5 MG/5ML PO SOLN: 5.0000 mg | Freq: Once | ORAL | Status: DC | PRN

## 2022-12-06 MED ORDER — MIDAZOLAM HCL 2 MG/2ML IJ SOLN
INTRAMUSCULAR | Status: AC
  Administered 2022-12-06: 1 mg via INTRAVENOUS
  Filled 2022-12-06: qty 2

## 2022-12-06 MED ORDER — ACETAMINOPHEN 325 MG PO TABS: 325.0000 mg | ORAL_TABLET | Freq: Four times a day (QID) | ORAL | Status: DC | PRN

## 2022-12-06 MED ORDER — METOCLOPRAMIDE HCL 5 MG PO TABS: 5.0000 mg | ORAL_TABLET | Freq: Three times a day (TID) | ORAL | Status: DC | PRN

## 2022-12-06 MED ORDER — FENTANYL CITRATE (PF) 100 MCG/2ML IJ SOLN
INTRAMUSCULAR | Status: DC | PRN
  Administered 2022-12-06: 50 ug via INTRAVENOUS

## 2022-12-06 MED ORDER — PHENYLEPHRINE HCL-NACL 20-0.9 MG/250ML-% IV SOLN
INTRAVENOUS | Status: DC | PRN
  Administered 2022-12-06: 50 ug/min via INTRAVENOUS

## 2022-12-06 MED ORDER — 0.9 % SODIUM CHLORIDE (POUR BTL) OPTIME
TOPICAL | Status: DC | PRN
  Administered 2022-12-06: 1000 mL

## 2022-12-06 MED ORDER — ONDANSETRON HCL 4 MG/2ML IJ SOLN
INTRAMUSCULAR | Status: DC | PRN
  Administered 2022-12-06: 4 mg via INTRAVENOUS

## 2022-12-06 MED ORDER — EPHEDRINE SULFATE (PRESSORS) 50 MG/ML IJ SOLN
INTRAMUSCULAR | Status: DC | PRN
  Administered 2022-12-06 (×2): 5 mg via INTRAVENOUS

## 2022-12-06 MED ORDER — LIDOCAINE HCL (PF) 2 % IJ SOLN
INTRAMUSCULAR | Status: AC
  Filled 2022-12-06: qty 5

## 2022-12-06 MED ORDER — LACTATED RINGERS IV SOLN
INTRAVENOUS | Status: DC
  Administered 2022-12-06: 1000 mL via INTRAVENOUS

## 2022-12-06 MED ORDER — MIDAZOLAM HCL 5 MG/5ML IJ SOLN
INTRAMUSCULAR | Status: DC | PRN
  Administered 2022-12-06 (×2): 1 mg via INTRAVENOUS

## 2022-12-06 MED ORDER — MIDAZOLAM HCL 2 MG/2ML IJ SOLN: 1.0000 mg | INTRAMUSCULAR | Status: DC

## 2022-12-06 MED ORDER — HYDROCODONE-ACETAMINOPHEN 7.5-325 MG PO TABS: 1.0000 | ORAL_TABLET | ORAL | Status: DC | PRN

## 2022-12-06 MED ORDER — METHOCARBAMOL 500 MG PO TABS: 500.0000 mg | ORAL_TABLET | Freq: Four times a day (QID) | ORAL | 0 refills | Status: DC | PRN

## 2022-12-06 MED ORDER — MORPHINE SULFATE (PF) 2 MG/ML IV SOLN: 0.5000 mg | INTRAVENOUS | Status: DC | PRN

## 2022-12-06 MED ORDER — ORAL CARE MOUTH RINSE: 15.0000 mL | Freq: Once | OROMUCOSAL | Status: DC

## 2022-12-06 MED ORDER — ACETAMINOPHEN 500 MG PO TABS
1000.0000 mg | ORAL_TABLET | Freq: Once | ORAL | Status: AC
  Administered 2022-12-06: 1000 mg via ORAL
  Filled 2022-12-06: qty 2

## 2022-12-06 MED ORDER — PROPOFOL 10 MG/ML IV BOLUS
INTRAVENOUS | Status: DC | PRN
  Administered 2022-12-06: 120 mg via INTRAVENOUS

## 2022-12-06 MED ORDER — FENTANYL CITRATE (PF) 100 MCG/2ML IJ SOLN
INTRAMUSCULAR | Status: AC
  Filled 2022-12-06: qty 2

## 2022-12-06 MED ORDER — METHOCARBAMOL 500 MG PO TABS: 500.0000 mg | ORAL_TABLET | Freq: Four times a day (QID) | ORAL | Status: DC | PRN

## 2022-12-06 MED ORDER — AMISULPRIDE (ANTIEMETIC) 5 MG/2ML IV SOLN: 10.0000 mg | Freq: Once | INTRAVENOUS | Status: DC | PRN

## 2022-12-06 MED ORDER — EPHEDRINE 5 MG/ML INJ
INTRAVENOUS | Status: AC
  Filled 2022-12-06: qty 5

## 2022-12-06 MED ORDER — PHENYLEPHRINE 80 MCG/ML (10ML) SYRINGE FOR IV PUSH (FOR BLOOD PRESSURE SUPPORT)
PREFILLED_SYRINGE | INTRAVENOUS | Status: AC
  Filled 2022-12-06: qty 10

## 2022-12-06 MED ORDER — ONDANSETRON HCL 4 MG/2ML IJ SOLN: 4.0000 mg | Freq: Four times a day (QID) | INTRAMUSCULAR | Status: DC | PRN

## 2022-12-06 MED ORDER — FENTANYL CITRATE PF 50 MCG/ML IJ SOSY
PREFILLED_SYRINGE | INTRAMUSCULAR | Status: AC
  Administered 2022-12-06: 50 ug via INTRAVENOUS
  Filled 2022-12-06: qty 2

## 2022-12-06 MED ORDER — MIDAZOLAM HCL 2 MG/2ML IJ SOLN
INTRAMUSCULAR | Status: AC
  Filled 2022-12-06: qty 2

## 2022-12-06 MED ORDER — BUPIVACAINE LIPOSOME 1.3 % IJ SUSP
INTRAMUSCULAR | Status: DC | PRN
  Administered 2022-12-06: 10 mL via PERINEURAL

## 2022-12-06 MED ORDER — ROCURONIUM BROMIDE 10 MG/ML (PF) SYRINGE
PREFILLED_SYRINGE | INTRAVENOUS | Status: AC
  Filled 2022-12-06: qty 10

## 2022-12-06 SURGICAL SUPPLY — 72 items
BAG COUNTER SPONGE SURGICOUNT (BAG) IMPLANT
BAG ZIPLOCK 12X15 (MISCELLANEOUS) ×1 IMPLANT
BASEPLATE GLENOID RSA 3X25 0D (Shoulder) IMPLANT
BIT DRILL 1.6MX128 (BIT) IMPLANT
BIT DRILL 3.2 PERIPHERAL SCREW (BIT) IMPLANT
BLADE SAW SGTL 73X25 THK (BLADE) ×1 IMPLANT
BOOTIES KNEE HIGH SLOAN (MISCELLANEOUS) ×2 IMPLANT
COOLER ICEMAN CLASSIC (MISCELLANEOUS) IMPLANT
COVER BACK TABLE 60X90IN (DRAPES) ×1 IMPLANT
COVER SURGICAL LIGHT HANDLE (MISCELLANEOUS) ×1 IMPLANT
DRAPE INCISE IOBAN 66X45 STRL (DRAPES) ×1 IMPLANT
DRAPE ORTHO SPLIT 77X108 STRL (DRAPES) ×2
DRAPE POUCH INSTRU U-SHP 10X18 (DRAPES) ×1 IMPLANT
DRAPE SHEET LG 3/4 BI-LAMINATE (DRAPES) ×1 IMPLANT
DRAPE SURG 17X11 SM STRL (DRAPES) ×1 IMPLANT
DRAPE SURG ORHT 6 SPLT 77X108 (DRAPES) ×2 IMPLANT
DRAPE TOP 10253 STERILE (DRAPES) ×1 IMPLANT
DRAPE U-SHAPE 47X51 STRL (DRAPES) ×1 IMPLANT
DRSG AQUACEL AG ADV 3.5X 6 (GAUZE/BANDAGES/DRESSINGS) ×1 IMPLANT
DRSG AQUACEL AG ADV 3.5X10 (GAUZE/BANDAGES/DRESSINGS) IMPLANT
DURAPREP 26ML APPLICATOR (WOUND CARE) ×2 IMPLANT
ELECT BLADE TIP CTD 4 INCH (ELECTRODE) ×1 IMPLANT
ELECT REM PT RETURN 15FT ADLT (MISCELLANEOUS) ×1 IMPLANT
FACESHIELD WRAPAROUND (MASK) ×1 IMPLANT
FACESHIELD WRAPAROUND OR TEAM (MASK) ×1 IMPLANT
GLENOSPHERE REV SHOULDER 36 (Joint) IMPLANT
GLOVE BIO SURGEON STRL SZ7.5 (GLOVE) ×1 IMPLANT
GLOVE BIOGEL PI IND STRL 6.5 (GLOVE) ×1 IMPLANT
GLOVE BIOGEL PI IND STRL 8 (GLOVE) ×1 IMPLANT
GLOVE SURG SS PI 6.5 STRL IVOR (GLOVE) ×1 IMPLANT
GOWN STRL REUS W/ TWL LRG LVL3 (GOWN DISPOSABLE) ×1 IMPLANT
GOWN STRL REUS W/ TWL XL LVL3 (GOWN DISPOSABLE) ×1 IMPLANT
GOWN STRL REUS W/TWL LRG LVL3 (GOWN DISPOSABLE) ×1
GOWN STRL REUS W/TWL XL LVL3 (GOWN DISPOSABLE) ×1
GUIDEPIN HUM 3X100 (PIN) IMPLANT
GUIDEWIRE GLENOID 2.5X220 (WIRE) IMPLANT
HANDPIECE INTERPULSE COAX TIP (DISPOSABLE) ×1
HOOD PEEL AWAY T7 (MISCELLANEOUS) ×3 IMPLANT
INSERT REVERSED HUMERAL SIZE 1 (Orthopedic Implant) IMPLANT
KIT BASIN OR (CUSTOM PROCEDURE TRAY) ×1 IMPLANT
KIT TURNOVER KIT A (KITS) IMPLANT
MANIFOLD NEPTUNE II (INSTRUMENTS) ×1 IMPLANT
NDL TROCAR POINT SZ 2 1/2 (NEEDLE) IMPLANT
NEEDLE TROCAR POINT SZ 2 1/2 (NEEDLE) IMPLANT
NS IRRIG 1000ML POUR BTL (IV SOLUTION) ×1 IMPLANT
PACK SHOULDER (CUSTOM PROCEDURE TRAY) ×1 IMPLANT
PAD COLD SHLDR WRAP-ON (PAD) IMPLANT
PROTECTOR NERVE ULNAR (MISCELLANEOUS) IMPLANT
RESTRAINT HEAD UNIVERSAL NS (MISCELLANEOUS) ×1 IMPLANT
RETRIEVER SUT HEWSON (MISCELLANEOUS) IMPLANT
SCREW 5.5X14 (Screw) IMPLANT
SCREW BONE INTRNL SM 7 (Screw) IMPLANT
SCREW PERIPHERAL 30 (Screw) IMPLANT
SET HNDPC FAN SPRY TIP SCT (DISPOSABLE) ×1 IMPLANT
SLING ARM IMMOBILIZER LRG (SOFTGOODS) IMPLANT
SLING ARM IMMOBILIZER MED (SOFTGOODS) IMPLANT
STEM HUMERAL SHORT STD SZ1 (Orthopedic Implant) IMPLANT
STRIP CLOSURE SKIN 1/2X4 (GAUZE/BANDAGES/DRESSINGS) ×2 IMPLANT
SUCTION FRAZIER HANDLE 10FR (MISCELLANEOUS)
SUCTION TUBE FRAZIER 10FR DISP (MISCELLANEOUS) IMPLANT
SUPPORT WRAP ARM LG (MISCELLANEOUS) ×1 IMPLANT
SUT ETHIBOND 2 V 37 (SUTURE) IMPLANT
SUT FIBERWIRE #2 38 REV NDL BL (SUTURE)
SUT MNCRL AB 4-0 PS2 18 (SUTURE) ×1 IMPLANT
SUT VIC AB 2-0 CT1 27 (SUTURE) ×2
SUT VIC AB 2-0 CT1 TAPERPNT 27 (SUTURE) ×2 IMPLANT
SUTURE FIBERWR#2 38 REV NDL BL (SUTURE) IMPLANT
TAPE LABRALWHITE 1.5X36 (TAPE) IMPLANT
TAPE SUT LABRALTAP WHT/BLK (SUTURE) IMPLANT
TOWEL OR 17X26 10 PK STRL BLUE (TOWEL DISPOSABLE) ×1 IMPLANT
TOWEL OR NON WOVEN STRL DISP B (DISPOSABLE) ×1 IMPLANT
WATER STERILE IRR 1000ML POUR (IV SOLUTION) ×1 IMPLANT

## 2022-12-06 NOTE — Anesthesia Procedure Notes (Signed)
Procedure Name: Intubation Date/Time: 12/06/2022 10:26 AM  Performed by: Garrel Ridgel, CRNAPre-anesthesia Checklist: Patient identified, Emergency Drugs available, Suction available and Patient being monitored Patient Re-evaluated:Patient Re-evaluated prior to induction Oxygen Delivery Method: Circle system utilized Preoxygenation: Pre-oxygenation with 100% oxygen Induction Type: IV induction Ventilation: Mask ventilation without difficulty Laryngoscope Size: Mac and 3 Grade View: Grade I Tube type: Oral Tube size: 7.0 mm Number of attempts: 1 Airway Equipment and Method: Stylet Placement Confirmation: ETT inserted through vocal cords under direct vision, positive ETCO2 and breath sounds checked- equal and bilateral Secured at: 21 cm Tube secured with: Tape Dental Injury: Teeth and Oropharynx as per pre-operative assessment

## 2022-12-06 NOTE — Op Note (Signed)
Procedure(s): RIGHT REVERSE SHOULDER ARTHROPLASTY Procedure Note  Courtney Crane female 72 y.o. 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 female  With massive irreparable right shoulder rotator cuff tear, failed conservative management.  Indicated for reverse total shoulder arthroplasty to try and decrease pain and improve function.  Surgeon: Rhae Hammock   Assistants: Loni Dolly PA-C Courtney Crane was present and scrubbed throughout the procedure and was essential in positioning, retraction, exposure, and closure)  Anesthesia: General endotracheal anesthesia with preoperative interscalene block given by the attending anesthesiologist    Procedure Detail  RIGHT REVERSE SHOULDER ARTHROPLASTY   Estimated Blood Loss:  less than 100 mL         Drains: none  Blood Given: none          Specimens: none        Complications:  * No complications entered in OR log *         Disposition: PACU - hemodynamically stable.         Condition: stable      OPERATIVE FINDINGS:  A Tornier perform pressfit reverse total shoulder arthroplasty was placed with a  size 1 stem, a 36+3 offset glenosphere, and a +0-mm poly insert.    PROCEDURE: The patient was identified in the preoperative holding area  where I personally marked the operative site after verifying site, side,  and procedure with the patient. An interscalene block given by  the attending anesthesiologist in the holding area and the patient was taken back to the operating room where all extremities were  carefully padded in position after general anesthesia was induced. She  was placed in a beach-chair position and the operative upper extremity was  prepped and draped in a standard sterile fashion. An approximately 10-  cm incision was  made from the tip of the coracoid process to the center  point of the humerus at the level of the axilla. Dissection was carried  down through subcutaneous tissues to the level of the cephalic vein  which was taken laterally with the deltoid. The pectoralis major was  retracted medially. The subdeltoid space was developed and the lateral  edge of the conjoined tendon was identified. The undersurface of  conjoined tendon was palpated and the musculocutaneous nerve was not in  the field. Retractor was placed underneath the conjoined and second  retractor was placed lateral into the deltoid. The circumflex humeral  artery and vessels were identified and clamped and coagulated. The  biceps tendon was absent.  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  The  joint was then gently externally rotated while the capsule was released  from the humeral neck around to just beyond the 6 o'clock position. At  this point, the joint was dislocated and the humeral head was presented  into the wound. The excessive osteophyte formation was removed with a  large rongeur.  The cutting guide was used to make the appropriate  head cut and the head was saved for potentially bone grafting.  The glenoid was exposed with the arm in an  abducted extended position. The anterior and posterior labrum were  completely excised and the capsule was released circumferentially to  allow for exposure of the glenoid for preparation.  The guidepin was placed centrally.  This was overreamed and the central drills were then used.  The +3  offset baseplate was then impacted.  Peripheral screws were drilled measured and filled with the appropriate size screw. The size 36 standard glenosphere was then impacted on the [facility] taper and the central screw was placed. The humerus was then again exposed and the diaphyseal reamers were used followed by the metaphyseal reamers. The final broach was left in place in the proximal trial was placed. The  joint was reduced and with this implant it was felt that soft tissue tensioning was appropriate with excellent stability and excellent range of motion. Therefore, final humeral stem was placed press-fit.  And then the trial polyethylene inserts were tested again and the above implant was felt to be the most appropriate for final insertion. The joint was reduced taken through full range of motion and felt to be stable. Soft tissue tension was appropriate.  The joint was then copiously irrigated with pulse  lavage and the wound was then closed. The subscapularis was not repaired.  Skin was closed with 2-0 Vicryl in a deep dermal layer and 4-0  Monocryl for skin closure. Steri-Strips were applied. Sterile  dressings were then applied as well as a sling. The patient was allowed  to awaken from general anesthesia, transferred to stretcher, and taken  to recovery room in stable condition.   POSTOPERATIVE PLAN: The patient will be observed in the recovery room and if her pain is well-controlled with the regional block and she is hemodynamically stable she can be discharged home today with family.

## 2022-12-06 NOTE — Anesthesia Procedure Notes (Signed)
Anesthesia Regional Block: Interscalene brachial plexus block   Pre-Anesthetic Checklist: , timeout performed,  Correct Patient, Correct Site, Correct Laterality,  Correct Procedure, Correct Position, site marked,  Risks and benefits discussed,  Pre-op evaluation,  At surgeon's request and post-op pain management  Laterality: Right  Prep: Maximum Sterile Barrier Precautions used, chloraprep       Needles:  Injection technique: Single-shot  Needle Type: Echogenic Stimulator Needle     Needle Length: 9cm  Needle Gauge: 22     Additional Needles:   Procedures:,,,, ultrasound used (permanent image in chart),,    Narrative:  Start time: 12/06/2022 9:27 AM End time: 12/06/2022 9:30 AM Injection made incrementally with aspirations every 5 mL.  Performed by: Personally  Anesthesiologist: Brennan Bailey, MD  Additional Notes: Risks, benefits, and alternative discussed. Patient gave consent for procedure. Patient prepped and draped in sterile fashion. Sedation administered, patient remains easily responsive to voice. Relevant anatomy identified with ultrasound guidance. Local anesthetic given in 5cc increments with no signs or symptoms of intravascular injection. No pain or paraesthesias with injection. Patient monitored throughout procedure with signs of LAST or immediate complications. Tolerated well. Ultrasound image placed in chart.  Tawny Asal, MD

## 2022-12-06 NOTE — Discharge Instructions (Addendum)
Discharge Instructions after Reverse Total Shoulder Arthroplasty   . A sling has been provided for you. You are to wear this at all times (except for bathing and dressing), until your first post operative visit with Dr. Chandler. Please also wear while sleeping at night. While you bath and dress, let the arm/elbow extend straight down to stretch your elbow. Wiggle your fingers and pump your first while your in the sling to prevent hand swelling. . Use ice on the shoulder intermittently over the first 48 hours after surgery. Continue to use ice or and ice machine as needed after 48 hours for pain control/swelling.  . Pain medicine has been prescribed for you.  . Use your medicine liberally over the first 48 hours, and then you can begin to taper your use. You may take Extra Strength Tylenol or Tylenol only in place of the pain pills. DO NOT take ANY nonsteroidal anti-inflammatory pain medications: Advil, Motrin, Ibuprofen, Aleve, Naproxen or Naprosyn.  . Take one aspirin a day for 2 weeks after surgery, unless you have an aspirin sensitivity/allergy or asthma.  . Leave your dressing on until your first follow up visit.  You may shower with the dressing.  Hold your arm as if you still have your sling on while you shower. . Simply allow the water to wash over the site and then pat dry. Make sure your axilla (armpit) is completely dry after showering.    Please call [phone] during normal business hours or [phone] after hours for any problems. Including the following:  - excessive redness of the incisions - drainage for more than 4 days - fever of more than 101.5 F  *Please note that pain medications will not be refilled after hours or on weekends.     

## 2022-12-06 NOTE — Transfer of Care (Signed)
Immediate Anesthesia Transfer of Care Note  Patient: Courtney Crane  Procedure(s) Performed: RIGHT REVERSE SHOULDER ARTHROPLASTY (Right: Shoulder)  Patient Location: PACU  Anesthesia Type:General  Level of Consciousness: awake, alert , oriented, and patient cooperative  Airway & Oxygen Therapy: Patient Spontanous Breathing and Patient connected to face mask oxygen  Post-op Assessment: Report given to RN and Post -op Vital signs reviewed and stable  Post vital signs: Reviewed and stable  Last Vitals:  Vitals Value Taken Time  BP 88/73 12/06/22 1200  Temp    Pulse 78 12/06/22 1202  Resp 14 12/06/22 1202  SpO2 100 % 12/06/22 1202  Vitals shown include unvalidated device data.  Last Pain:  Vitals:   12/06/22 0758  TempSrc:   PainSc: 0-No pain      Patients Stated Pain Goal: 4 (74/08/14 4818)  Complications: No notable events documented.

## 2022-12-06 NOTE — Evaluation (Signed)
Occupational Therapy Evaluation Patient Details Name: Courtney Crane MRN: [id number] DOB: Jun 05, 1951 Today's Date: 12/06/2022   History of Present Illness patient s/p RIGHT REVERSE SHOULDER ARTHROPLASTY   Clinical Impression   Courtney Crane is a 48 year ol woman s/p shoulder replacement without functional use of right dominant upper extremity secondary to effects of surgery and interscalene block and shoulder precautions. Therapist provided education and instruction to patient and spouse in regards to exercises, precautions, positioning, donning upper extremity clothing and bathing while maintaining shoulder precautions, ice and edema management and donning/doffing sling. Patient and spouse verbalized understanding of all instruction. Handouts provided to maximize retention of education. Patient to follow up with MD for further therapy needs.        Recommendations for follow up therapy are one component of a multi-disciplinary discharge planning process, led by the attending physician.  Recommendations may be updated based on patient status, additional functional criteria and insurance authorization.   Follow Up Recommendations  Follow physician's recommendations for discharge plan and follow up therapies     Assistance Recommended at Discharge PRN  Patient can return home with the following A little help with bathing/dressing/bathroom;Assistance with cooking/housework    Functional Status Assessment  Patient has had a recent decline in their functional status and demonstrates the ability to make significant improvements in function in a reasonable and predictable amount of time.  Equipment Recommendations  None recommended by OT    Recommendations for Other Services       Precautions / Restrictions Precautions Precautions: Shoulder Type of Shoulder Precautions: No AROM, No PROM Shoulder Interventions: Off for dressing/bathing/exercises Required Braces or Orthoses:  Sling Restrictions Weight Bearing Restrictions: Yes RUE Weight Bearing: Non weight bearing      Mobility Bed Mobility                    Transfers Overall transfer level: Independent                        Balance Overall balance assessment: No apparent balance deficits (not formally assessed)                                         ADL either performed or assessed with clinical judgement   ADL                                               Vision Patient Visual Report: No change from baseline       Perception     Praxis      Pertinent Vitals/Pain Pain Assessment Pain Assessment: No/denies pain     Hand Dominance     Extremity/Trunk Assessment Upper Extremity Assessment Upper Extremity Assessment: RUE deficits/detail;LUE deficits/detail RUE Deficits / Details: Impaired sensation and motor control secondary to block RUE Coordination: decreased fine motor;decreased gross motor LUE Deficits / Details: WFL ROM and strength LUE Sensation: WNL   Lower Extremity Assessment Lower Extremity Assessment: Overall WFL for tasks assessed   Cervical / Trunk Assessment Cervical / Trunk Assessment: Normal   Communication     Cognition Arousal/Alertness: Awake/alert Behavior During Therapy: WFL for tasks assessed/performed Overall Cognitive Status: Within Functional Limits for tasks assessed  General Comments       Exercises     Shoulder Instructions Shoulder Instructions Donning/doffing shirt without moving shoulder: Patient able to independently direct caregiver Method for sponge bathing under operated UE: Patient able to independently direct caregiver Donning/doffing sling/immobilizer: Patient able to independently direct caregiver Correct positioning of sling/immobilizer: Patient able to independently direct caregiver ROM for elbow, wrist and digits of  operated UE: Patient able to independently direct caregiver Sling wearing schedule (on at all times/off for ADL's): Patient able to independently direct caregiver Proper positioning of operated UE when showering: Patient able to independently direct caregiver Dressing change: Patient able to independently direct caregiver Positioning of UE while sleeping: Patient able to independently direct caregiver    Home Living Family/patient expects to be discharged to:: Private residence Living Arrangements: Spouse/significant other Available Help at Discharge: Family;Available 24 hours/day                                    Prior Functioning/Environment                          OT Problem List: Decreased strength;Decreased range of motion;Impaired UE functional use      OT Treatment/Interventions:      OT Goals(Current goals can be found in the care plan section) Acute Rehab OT Goals OT Goal Formulation: All assessment and education complete, DC therapy  OT Frequency:      Co-evaluation              AM-PAC OT "6 Clicks" Daily Activity     Outcome Measure Help from another person eating meals?: None Help from another person taking care of personal grooming?: None Help from another person toileting, which includes using toliet, bedpan, or urinal?: None Help from another person bathing (including washing, rinsing, drying)?: A Little Help from another person to put on and taking off regular upper body clothing?: A Lot Help from another person to put on and taking off regular lower body clothing?: None 6 Click Score: 21   End of Session Nurse Communication: Mobility status  Activity Tolerance: Patient tolerated treatment well Patient left: in chair;with call bell/phone within reach;with family/visitor present  OT Visit Diagnosis: Pain                Time: 2585-2778 OT Time Calculation (min): 15 min Charges:  OT General Charges $OT Visit: 1 Visit OT  Evaluation $OT Eval Low Complexity: 1 Low  Gustavo Lah, OTR/L Acute Care Rehab Services  Office [phone]   Lenward Chancellor 12/06/2022, 2:24 PM

## 2022-12-07 ENCOUNTER — Encounter (HOSPITAL_COMMUNITY): Payer: Self-pay | Admitting: Orthopedic Surgery

## 2022-12-07 NOTE — Anesthesia Postprocedure Evaluation (Signed)
Anesthesia Post Note  Patient: Courtney Crane  Procedure(s) Performed: RIGHT REVERSE SHOULDER ARTHROPLASTY (Right: Shoulder)     Patient location during evaluation: PACU Anesthesia Type: General Level of consciousness: awake and alert Pain management: pain level controlled Vital Signs Assessment: post-procedure vital signs reviewed and stable Respiratory status: spontaneous breathing, nonlabored ventilation, respiratory function stable and patient connected to nasal cannula oxygen Cardiovascular status: blood pressure returned to baseline and stable Postop Assessment: no apparent nausea or vomiting Anesthetic complications: no   No notable events documented.  Last Vitals:  Vitals:   12/06/22 1300 12/06/22 1315  BP: (!) 113/53 (!) 123/59  Pulse: 74   Resp: 13 15  Temp:    SpO2: 94% 95%    Last Pain:  Vitals:   12/06/22 1346  TempSrc:   PainSc: 0-No pain                 Effie Berkshire

## 2022-12-13 NOTE — H&P (Signed)
Courtney Crane is an 72 y.o. female.   Chief Complaint: R shoulder pain and dysfunction HPI: R shoulder massive irreparable RCT, failed conservative treatment.  Past Medical History:  Diagnosis Date   Anemia    Anxiety    Arthritis    Asthma    Cataract    Colon polyps    Complication of anesthesia    "sometimes I dont like to wake up after " ie difficult to awaken    Diverticulitis    Diverticulosis    Fatty liver    GERD (gastroesophageal reflux disease)    HLD (hyperlipidemia)    HTN (hypertension)    IBS (irritable bowel syndrome)    Insomnia    MRSA infection    Osteoarthritis    Osteopenia    PONV (postoperative nausea and vomiting)    RA (rheumatoid arthritis) (HCC)    Rheumatoid arthritis (HCC)    Seasonal allergies    Shingles    Status post dilation of esophageal narrowing    Vitamin D deficiency     Past Surgical History:  Procedure Laterality Date   ADENOIDECTOMY     APPENDECTOMY  1975   bladder tack  1990   CATARACT EXTRACTION, BILATERAL Bilateral    CHOLECYSTECTOMY N/A 01/31/2017   Procedure: LAPAROSCOPIC CHOLECYSTECTOMY WITH INTRAOPERATIVE CHOLANGIOGRAM;  Surgeon: Armandina Gemma, MD;  Location: [facility];  Service: General;  Laterality: N/A;   INCISION AND DRAINAGE PERIRECTAL ABSCESS N/A 12/02/2017   Procedure: IRRIGATION AND DEBRIDEMENT PERINEAL ABSCESS;  Surgeon: Greer Pickerel, MD;  Location: [place];  Service: General;  Laterality: N/A;   LUMBAR FUSION     MENISCUS REPAIR Right 2013   REVERSE SHOULDER ARTHROPLASTY Right 12/06/2022   Procedure: RIGHT REVERSE SHOULDER ARTHROPLASTY;  Surgeon: Tania Ade, MD;  Location: [facility];  Service: Orthopedics;  Laterality: Right;   ROTATOR CUFF REPAIR Right 2005   TONSILLECTOMY     as a child   TOTAL ABDOMINAL HYSTERECTOMY  1988   TOTAL KNEE ARTHROPLASTY Left    WRIST RECONSTRUCTION Right 2008    Family History  Problem Relation Age of Onset   Colon polyps Mother    Diabetes Mother    Heart disease  Mother    Kidney cancer Mother    Arthritis Mother    Hypertension Mother    Hyperlipidemia Mother    Colon polyps Son    CAD Brother    Esophageal cancer Brother    Dementia Father    Colon cancer Neg Hx    Stomach cancer Neg Hx    Rectal cancer Neg Hx    Prostate cancer Neg Hx    Social History:  reports that she has never smoked. She has never used smokeless tobacco. She reports current alcohol use of about 7.0 standard drinks of alcohol per week. She reports that she does not use drugs.  Allergies:  Allergies  Allergen Reactions   Meperidine Nausea Only   Methotrexate Other (See Comments)    Unknown reaction   Nickel Rash   Tape Rash and Other (See Comments)    No medications prior to admission.    No results found for this or any previous visit (from the past 48 hour(s)). No results found.  Review of Systems  All other systems reviewed and are negative.   Blood pressure (!) 123/59, pulse 74, temperature 98 F (36.7 C), resp. rate 15, height '5\' 2"'$  (1.575 m), weight 61.7 kg, SpO2 95 %. Physical Exam HENT:     Head: Atraumatic.  Eyes:     Extraocular Movements: Extraocular movements intact.  Cardiovascular:     Pulses: Normal pulses.  Musculoskeletal:     Comments: R shoulder pain and weakness      Assessment/Plan R shoulder massive irreparable RCT, failed conservative treatment. Plan R reverse TSA Risks / benefits of surgery discussed Consent on chart  NPO for OR Preop antibiotics   Rhae Hammock, MD 12/13/2022, 7:16 AM

## 2022-12-21 ENCOUNTER — Ambulatory Visit: Payer: PPO | Admitting: Allergy

## 2022-12-21 DIAGNOSIS — S46011A Strain of muscle(s) and tendon(s) of the rotator cuff of right shoulder, initial encounter: Secondary | ICD-10-CM | POA: Diagnosis not present

## 2022-12-25 ENCOUNTER — Encounter (HOSPITAL_BASED_OUTPATIENT_CLINIC_OR_DEPARTMENT_OTHER): Payer: PPO | Admitting: Radiology

## 2022-12-25 DIAGNOSIS — Z1231 Encounter for screening mammogram for malignant neoplasm of breast: Secondary | ICD-10-CM

## 2022-12-27 ENCOUNTER — Other Ambulatory Visit: Payer: Self-pay | Admitting: Pulmonary Disease

## 2022-12-31 ENCOUNTER — Other Ambulatory Visit (HOSPITAL_BASED_OUTPATIENT_CLINIC_OR_DEPARTMENT_OTHER): Payer: Self-pay | Admitting: Radiology

## 2022-12-31 DIAGNOSIS — Z1231 Encounter for screening mammogram for malignant neoplasm of breast: Secondary | ICD-10-CM

## 2023-01-01 ENCOUNTER — Other Ambulatory Visit: Payer: Self-pay | Admitting: Physician Assistant

## 2023-01-01 ENCOUNTER — Other Ambulatory Visit: Payer: Self-pay | Admitting: Obstetrics and Gynecology

## 2023-01-01 DIAGNOSIS — M0579 Rheumatoid arthritis with rheumatoid factor of multiple sites without organ or systems involvement: Secondary | ICD-10-CM | POA: Diagnosis not present

## 2023-01-01 DIAGNOSIS — Z79899 Other long term (current) drug therapy: Secondary | ICD-10-CM | POA: Diagnosis not present

## 2023-01-01 DIAGNOSIS — N3281 Overactive bladder: Secondary | ICD-10-CM

## 2023-01-04 ENCOUNTER — Other Ambulatory Visit: Payer: Self-pay

## 2023-01-04 ENCOUNTER — Encounter: Payer: Self-pay | Admitting: Allergy

## 2023-01-04 ENCOUNTER — Ambulatory Visit: Payer: PPO | Admitting: Allergy

## 2023-01-04 VITALS — BP 134/62 | HR 86 | Temp 97.9°F | Resp 15 | Ht 63.78 in | Wt 138.7 lb

## 2023-01-04 DIAGNOSIS — R0602 Shortness of breath: Secondary | ICD-10-CM

## 2023-01-04 DIAGNOSIS — B370 Candidal stomatitis: Secondary | ICD-10-CM | POA: Diagnosis not present

## 2023-01-04 DIAGNOSIS — J4530 Mild persistent asthma, uncomplicated: Secondary | ICD-10-CM | POA: Diagnosis not present

## 2023-01-04 DIAGNOSIS — J3089 Other allergic rhinitis: Secondary | ICD-10-CM

## 2023-01-04 DIAGNOSIS — H1013 Acute atopic conjunctivitis, bilateral: Secondary | ICD-10-CM | POA: Diagnosis not present

## 2023-01-04 MED ORDER — NYSTATIN 100000 UNIT/ML MT SUSP: 5.0000 mL | Freq: Four times a day (QID) | OROMUCOSAL | 0 refills | Status: AC

## 2023-01-04 NOTE — Progress Notes (Signed)
Follow-up Note  RE: Courtney Crane MRN: [id number] DOB: 06/09/1951 Date of Office Visit: 01/04/2023   History of present illness: Courtney Crane is a 72 y.o. female presenting today for follow-up of shortness of breath/asthma and allergic rhinitis with conjunctivitis.  She was last seen in the office on 07/13/2022 by myself.  She states she feels that she is doing much better than she was this time last year.  She recalls a time where she would be short of breath after just bathing.  She is on the Breztri inhaler taking 2 puffs twice a day at this time.  She is on Dupixent injections every 2 weeks that she gives herself at home.  She rotates her injection sites.  She does not report any issues with the administration of the medication.  She does believe that the combination of these medications have been working quite well for her.  Since the last time I saw her she states she has used her albuterol maybe 1 time.  She has not had any ED or urgent care visit. since the last visit.  She did complete the prednisone course that she was on through her pulmonologist from the last visit.  She has not had any further systemic steroids. With her allergies she takes half tab of Allegra every day at this time.  This still seems to be working quite well.  The nasal spray Atrovent that I recommended she try at the last visit works as well too.  She states she "loves it".  It does help decrease the drainage.  She is only needing to use 1 spray daily in each nostril.  She did note when she did extra sprays that she would develop was tiny nosebleed.  Review of systems: Review of Systems  Constitutional: Negative.   HENT: Negative.    Eyes: Negative.   Respiratory: Negative.    Cardiovascular: Negative.   Gastrointestinal: Negative.   Musculoskeletal: Negative.   Skin: Negative.   Allergic/Immunologic: Negative.   Neurological: Negative.      All other systems negative unless noted above in  HPI  Past medical/social/surgical/family history have been reviewed and are unchanged unless specifically indicated below.  No changes  Medication List: Current Outpatient Medications  Medication Sig Dispense Refill   acetaminophen (TYLENOL) 500 MG tablet Take 1,000 mg by mouth every 6 (six) hours as needed (pain.).     albuterol (VENTOLIN HFA) 108 (90 Base) MCG/ACT inhaler Inhale 2 puffs into the lungs every 6 (six) hours as needed for wheezing or shortness of breath. 8 g 6   amLODipine (NORVASC) 10 MG tablet Take 10 mg by mouth in the morning.     aspirin EC 81 MG tablet Take 81 mg by mouth every evening. Swallow whole.     bismuth subsalicylate (PEPTO BISMOL) 262 MG chewable tablet Chew 262-524 mg by mouth 3 (three) times daily as needed for diarrhea or loose stools or indigestion.     BREZTRI AEROSPHERE 160-9-4.8 MCG/ACT AERO INHALE 2 PUFFS INTO THE LUNGS IN THE MORNING AND AT BEDTIME. 10.7 each 1   Calcium Carb-Cholecalciferol (CALCIUM 600+D3 PO) Take 1 tablet by mouth in the morning.     calcium elemental as carbonate (TUMS ULTRA 1000) 400 MG chewable tablet Chew 1,000 mg by mouth 3 (three) times daily as needed for heartburn.     CANNABIDIOL PO Take 1 capsule by mouth at bedtime. CBD     Cholecalciferol (VITAMIN D-3) 125 MCG (5000 UT) TABS Take 5,000 Units  by mouth in the morning.     Dupilumab (DUPIXENT) 300 MG/2ML SOPN Inject 300 mg into the skin every 14 (fourteen) days. 12 mL 1   famotidine (PEPCID) 40 MG tablet TAKE 1 TABLET BY MOUTH EVERYDAY AT BEDTIME 90 tablet 3   fexofenadine (ALLEGRA) 180 MG tablet Take 90 mg by mouth in the morning.     hydrochlorothiazide (HYDRODIURIL) 25 MG tablet Take 25 mg by mouth in the morning.     ipratropium (ATROVENT) 0.06 % nasal spray Place 2 sprays into both nostrils 2 (two) times daily. (Patient taking differently: Place 2 sprays into both nostrils in the morning.) 15 mL 5   leflunomide (ARAVA) 20 MG tablet Take 20 mg by mouth every evening.      losartan (COZAAR) 100 MG tablet Take 100 mg by mouth daily.     Magnesium 250 MG TABS Take 250 mg by mouth every evening.     MELATONIN PO Take 1.5 mg by mouth at bedtime as needed (sleep).     Multiple Vitamins-Minerals (IMMUNE SUPPORT VITAMIN C PO) Take 1 tablet by mouth in the morning. With Vitamin D 3/Vitamin A/Magnesium     MYRBETRIQ 25 MG TB24 tablet TAKE 1 TABLET (25 MG TOTAL) BY MOUTH DAILY. 30 tablet 5   nystatin (MYCOSTATIN) 100000 UNIT/ML suspension Take 5 mLs (500,000 Units total) by mouth 4 (four) times daily for 7 days. Swish in the mouth and retain for as long as possible (several minutes) before swallowing 140 mL 0   Omega-3 Fatty Acids (FISH OIL) 1200 MG CAPS Take 1,200 mg by mouth 2 (two) times daily.     omeprazole (PRILOSEC) 40 MG capsule Take 1 capsule (40 mg total) by mouth daily before breakfast. Please schedule a yearly follow up for further refills. Thank you 30 capsule 1   polyethylene glycol (MIRALAX / GLYCOLAX) 17 g packet Take 17 g by mouth every Monday, Wednesday, and Friday. In the morning     Psyllium (METAMUCIL 4 IN 1 FIBER PO) Take 2 capsules by mouth in the morning. Metamucil 3-in-1 Psyllium Fiber Supplement Capsules     rosuvastatin (CRESTOR) 5 MG tablet Take 5 mg by mouth every evening.     simethicone (MYLICON) 0000000 MG chewable tablet Chew 125 mg by mouth every 6 (six) hours as needed for flatulence.     sodium chloride (OCEAN) 0.65 % SOLN nasal spray Place 1 spray into both nostrils in the morning and at bedtime.     valACYclovir (VALTREX) 500 MG tablet Take 500 mg by mouth daily as needed (outbreaks).     inFLIXimab-abda (RENFLEXIS) 100 MG SOLR Inject 1 each into the vein every 8 (eight) weeks.     methocarbamol (ROBAXIN) 500 MG tablet Take 1 tablet (500 mg total) by mouth every 6 (six) hours as needed for muscle spasms. 30 tablet 0   Multiple Vitamin (MULTIVITAMIN) tablet Take 1 tablet by mouth in the morning.     oxyCODONE-acetaminophen (PERCOCET) 5-325 MG  tablet Take 1-2 tablets by mouth every 6 (six) hours as needed for severe pain (post op pain). 20 tablet 0   No current facility-administered medications for this visit.     Known medication allergies: Allergies  Allergen Reactions   Meperidine Nausea Only   Methotrexate Other (See Comments)    Unknown reaction   Nickel Rash   Tape Rash and Other (See Comments)     Physical examination: Blood pressure 134/62, pulse 86, temperature 97.9 F (36.6 C), temperature source Temporal, resp.  rate 15, height 5' 3.78" (1.62 m), weight 138 lb 11.2 oz (62.9 kg), SpO2 96 %.  General: Alert, interactive, in no acute distress. HEENT: PERRLA, TMs pearly gray, turbinates non-edematous without discharge, post-pharynx non erythematous, few tiny white patchy areas on the soft palate. Neck: Supple without lymphadenopathy. Lungs: Clear to auscultation without wheezing, rhonchi or rales. {no increased work of breathing. CV: Normal S1, S2 without murmurs. Abdomen: Nondistended, nontender. Skin: Warm and dry, without lesions or rashes. Extremities:  No clubbing, cyanosis or edema. Neuro:   Grossly intact.  Diagnositics/Labs:  Spirometry: FEV1: 1.16L 57%, FVC: 1.33L 49% predicted  Assessment and plan: Shortness of breath History of Asthma - Continue Dupixent injections every 2 weeks.  Rotate your injection areas.  - Continue Breztri 2 puffs twice a day.   If remains under good control then will plan to start weaning medications and will decrease to 1 puff twice a day - have access to albuterol inhaler 2 puffs every 4-6 hours as needed for cough/wheeze/shortness of breath/chest tightness.  May use 15-20 minutes prior to activity.   Monitor frequency of use.    Breathing control goals:  Full participation in all desired activities (may need albuterol before activity) Albuterol use two time or less a week on average (not counting use with activity) Cough interfering with sleep two time or less a  month Oral steroids no more than once a year No hospitalizations  Allergic rhinitis with conjunctivitis - Continue avoidance measures for dust mites, cat, dog, tree pollen, grass pollen, weed pollens.   - continue 1/2 tab Allegra daily.   May take additional 1/2 tab if needed - continue Atrovent nasal spray 1 spray daily.  For nasal drainage/runny nose can take up to 3-4 times a day if needed - for itch/watery eyes can use OTC Pataday or Pataday extra strength 1 drop each eye daily as needed - if medication management is not effective then can consider course of allergen immunotherapy  Thrush - small area of white patches on the roof of mouth - use Nystatin 4 days a day for 7 days, swish and swallow - rinse mouth after Breztri use  Follow-up in 6 months or sooner if needed  I appreciate the opportunity to take part in Courtney Crane care. Please do not hesitate to contact me with questions.  Sincerely,   Prudy Feeler, MD Allergy/Immunology [facility]

## 2023-01-04 NOTE — Patient Instructions (Addendum)
-   Continue Dupixent injections every 2 weeks.  Rotate your injection areas.  - Continue Breztri 2 puffs twice a day.   If remains under good control then will plan to start weaning medications and will decrease to 1 puff twice a day - have access to albuterol inhaler 2 puffs every 4-6 hours as needed for cough/wheeze/shortness of breath/chest tightness.  May use 15-20 minutes prior to activity.   Monitor frequency of use.    Breathing control goals:  Full participation in all desired activities (may need albuterol before activity) Albuterol use two time or less a week on average (not counting use with activity) Cough interfering with sleep two time or less a month Oral steroids no more than once a year No hospitalizations  - Continue avoidance measures for dust mites, cat, dog, tree pollen, grass pollen, weed pollens.   - continue 1/2 tab Allegra daily.   May take additional 1/2 tab if needed - continue Atrovent nasal spray 1 spray daily.  For nasal drainage/runny nose can take up to 3-4 times a day if needed - for itch/watery eyes can use OTC Pataday or Pataday extra strength 1 drop each eye daily as needed - if medication management is not effective then can consider course of allergen immunotherapy  - small area of white patches on the roof of mouth - use Nystatin 4 days a day for 7 days, swish and swallow - rinse mouth after Breztri use  Follow-up in 6 months or sooner if needed

## 2023-01-07 ENCOUNTER — Encounter: Payer: Self-pay | Admitting: *Deleted

## 2023-01-07 ENCOUNTER — Encounter: Payer: Self-pay | Admitting: Allergy

## 2023-01-07 ENCOUNTER — Other Ambulatory Visit: Payer: Self-pay | Admitting: Pharmacist

## 2023-01-07 DIAGNOSIS — J4551 Severe persistent asthma with (acute) exacerbation: Secondary | ICD-10-CM

## 2023-01-07 MED ORDER — DUPIXENT 300 MG/2ML ~~LOC~~ SOAJ: 300.0000 mg | SUBCUTANEOUS | 2 refills | Status: DC

## 2023-01-07 NOTE — Telephone Encounter (Signed)
Refill sent for [place] to [facility]: [phone]  Dose: 300 mg subcut every 2 weeks  Last OV: 09/03/2022 Provider: Dr. Valeta Harms  Next OV: not scheduled but due Oct 2024  Patient had right reverse shoulder arthroplasty in Jan 2024  Knox Saliva, PharmD, MPH, BCPS Clinical Pharmacist (Rheumatology and Pulmonology)

## 2023-01-08 ENCOUNTER — Inpatient Hospital Stay (HOSPITAL_BASED_OUTPATIENT_CLINIC_OR_DEPARTMENT_OTHER): Admission: RE | Admit: 2023-01-08 | Payer: PPO | Source: Ambulatory Visit | Admitting: Radiology

## 2023-01-16 DIAGNOSIS — Z79899 Other long term (current) drug therapy: Secondary | ICD-10-CM | POA: Diagnosis not present

## 2023-01-16 DIAGNOSIS — E663 Overweight: Secondary | ICD-10-CM | POA: Diagnosis not present

## 2023-01-16 DIAGNOSIS — M0579 Rheumatoid arthritis with rheumatoid factor of multiple sites without organ or systems involvement: Secondary | ICD-10-CM | POA: Diagnosis not present

## 2023-01-16 DIAGNOSIS — G5793 Unspecified mononeuropathy of bilateral lower limbs: Secondary | ICD-10-CM | POA: Diagnosis not present

## 2023-01-16 DIAGNOSIS — Z6826 Body mass index (BMI) 26.0-26.9, adult: Secondary | ICD-10-CM | POA: Diagnosis not present

## 2023-01-16 DIAGNOSIS — M1991 Primary osteoarthritis, unspecified site: Secondary | ICD-10-CM | POA: Diagnosis not present

## 2023-01-18 ENCOUNTER — Ambulatory Visit: Payer: PPO | Admitting: Student

## 2023-01-25 ENCOUNTER — Other Ambulatory Visit: Payer: Self-pay | Admitting: Gastroenterology

## 2023-01-28 DIAGNOSIS — S46011A Strain of muscle(s) and tendon(s) of the rotator cuff of right shoulder, initial encounter: Secondary | ICD-10-CM | POA: Diagnosis not present

## 2023-01-29 DIAGNOSIS — R531 Weakness: Secondary | ICD-10-CM | POA: Diagnosis not present

## 2023-01-29 DIAGNOSIS — Z4789 Encounter for other orthopedic aftercare: Secondary | ICD-10-CM | POA: Diagnosis not present

## 2023-01-29 DIAGNOSIS — Z96611 Presence of right artificial shoulder joint: Secondary | ICD-10-CM | POA: Diagnosis not present

## 2023-01-31 DIAGNOSIS — Z4789 Encounter for other orthopedic aftercare: Secondary | ICD-10-CM | POA: Diagnosis not present

## 2023-01-31 DIAGNOSIS — R531 Weakness: Secondary | ICD-10-CM | POA: Diagnosis not present

## 2023-01-31 DIAGNOSIS — Z96611 Presence of right artificial shoulder joint: Secondary | ICD-10-CM | POA: Diagnosis not present

## 2023-02-06 DIAGNOSIS — Z96611 Presence of right artificial shoulder joint: Secondary | ICD-10-CM | POA: Diagnosis not present

## 2023-02-06 DIAGNOSIS — Z4789 Encounter for other orthopedic aftercare: Secondary | ICD-10-CM | POA: Diagnosis not present

## 2023-02-06 DIAGNOSIS — R531 Weakness: Secondary | ICD-10-CM | POA: Diagnosis not present

## 2023-02-07 ENCOUNTER — Ambulatory Visit (HOSPITAL_BASED_OUTPATIENT_CLINIC_OR_DEPARTMENT_OTHER)
Admission: RE | Admit: 2023-02-07 | Discharge: 2023-02-07 | Disposition: A | Discharge status: Home / Self Care | Payer: PPO | Source: Ambulatory Visit | Attending: Radiology | Admitting: Radiology

## 2023-02-07 DIAGNOSIS — Z1231 Encounter for screening mammogram for malignant neoplasm of breast: Secondary | ICD-10-CM | POA: Insufficient documentation

## 2023-02-08 DIAGNOSIS — Z96611 Presence of right artificial shoulder joint: Secondary | ICD-10-CM | POA: Diagnosis not present

## 2023-02-08 DIAGNOSIS — Z4789 Encounter for other orthopedic aftercare: Secondary | ICD-10-CM | POA: Diagnosis not present

## 2023-02-08 DIAGNOSIS — R531 Weakness: Secondary | ICD-10-CM | POA: Diagnosis not present

## 2023-02-11 DIAGNOSIS — Z96611 Presence of right artificial shoulder joint: Secondary | ICD-10-CM | POA: Diagnosis not present

## 2023-02-11 DIAGNOSIS — Z4789 Encounter for other orthopedic aftercare: Secondary | ICD-10-CM | POA: Diagnosis not present

## 2023-02-11 DIAGNOSIS — R531 Weakness: Secondary | ICD-10-CM | POA: Diagnosis not present

## 2023-02-13 DIAGNOSIS — R531 Weakness: Secondary | ICD-10-CM | POA: Diagnosis not present

## 2023-02-13 DIAGNOSIS — Z96611 Presence of right artificial shoulder joint: Secondary | ICD-10-CM | POA: Diagnosis not present

## 2023-02-13 DIAGNOSIS — Z4789 Encounter for other orthopedic aftercare: Secondary | ICD-10-CM | POA: Diagnosis not present

## 2023-02-14 DIAGNOSIS — M0579 Rheumatoid arthritis with rheumatoid factor of multiple sites without organ or systems involvement: Secondary | ICD-10-CM | POA: Diagnosis not present

## 2023-02-19 DIAGNOSIS — R531 Weakness: Secondary | ICD-10-CM | POA: Diagnosis not present

## 2023-02-19 DIAGNOSIS — Z96611 Presence of right artificial shoulder joint: Secondary | ICD-10-CM | POA: Diagnosis not present

## 2023-02-19 DIAGNOSIS — Z4789 Encounter for other orthopedic aftercare: Secondary | ICD-10-CM | POA: Diagnosis not present

## 2023-02-21 ENCOUNTER — Encounter: Payer: Self-pay | Admitting: Obstetrics and Gynecology

## 2023-02-21 DIAGNOSIS — Z96611 Presence of right artificial shoulder joint: Secondary | ICD-10-CM | POA: Diagnosis not present

## 2023-02-21 DIAGNOSIS — R531 Weakness: Secondary | ICD-10-CM | POA: Diagnosis not present

## 2023-02-21 DIAGNOSIS — Z4789 Encounter for other orthopedic aftercare: Secondary | ICD-10-CM | POA: Diagnosis not present

## 2023-02-21 MED ORDER — MIRABEGRON ER 25 MG PO TB24: 25.0000 mg | ORAL_TABLET | Freq: Every day | ORAL | 3 refills | Status: DC

## 2023-02-23 ENCOUNTER — Other Ambulatory Visit: Payer: Self-pay | Admitting: Pulmonary Disease

## 2023-02-26 DIAGNOSIS — R531 Weakness: Secondary | ICD-10-CM | POA: Diagnosis not present

## 2023-02-26 DIAGNOSIS — Z96611 Presence of right artificial shoulder joint: Secondary | ICD-10-CM | POA: Diagnosis not present

## 2023-02-26 DIAGNOSIS — Z4789 Encounter for other orthopedic aftercare: Secondary | ICD-10-CM | POA: Diagnosis not present

## 2023-02-28 DIAGNOSIS — Z4789 Encounter for other orthopedic aftercare: Secondary | ICD-10-CM | POA: Diagnosis not present

## 2023-02-28 DIAGNOSIS — Z96611 Presence of right artificial shoulder joint: Secondary | ICD-10-CM | POA: Diagnosis not present

## 2023-02-28 DIAGNOSIS — R531 Weakness: Secondary | ICD-10-CM | POA: Diagnosis not present

## 2023-03-04 DIAGNOSIS — R531 Weakness: Secondary | ICD-10-CM | POA: Diagnosis not present

## 2023-03-04 DIAGNOSIS — Z96611 Presence of right artificial shoulder joint: Secondary | ICD-10-CM | POA: Diagnosis not present

## 2023-03-04 DIAGNOSIS — Z4789 Encounter for other orthopedic aftercare: Secondary | ICD-10-CM | POA: Diagnosis not present

## 2023-03-05 DIAGNOSIS — L812 Freckles: Secondary | ICD-10-CM | POA: Diagnosis not present

## 2023-03-05 DIAGNOSIS — L720 Epidermal cyst: Secondary | ICD-10-CM | POA: Diagnosis not present

## 2023-03-05 DIAGNOSIS — L821 Other seborrheic keratosis: Secondary | ICD-10-CM | POA: Diagnosis not present

## 2023-03-05 DIAGNOSIS — L218 Other seborrheic dermatitis: Secondary | ICD-10-CM | POA: Diagnosis not present

## 2023-03-11 DIAGNOSIS — Z4789 Encounter for other orthopedic aftercare: Secondary | ICD-10-CM | POA: Diagnosis not present

## 2023-03-11 DIAGNOSIS — Z96611 Presence of right artificial shoulder joint: Secondary | ICD-10-CM | POA: Diagnosis not present

## 2023-03-11 DIAGNOSIS — R531 Weakness: Secondary | ICD-10-CM | POA: Diagnosis not present

## 2023-03-11 DIAGNOSIS — S46011A Strain of muscle(s) and tendon(s) of the rotator cuff of right shoulder, initial encounter: Secondary | ICD-10-CM | POA: Diagnosis not present

## 2023-03-14 DIAGNOSIS — Z4789 Encounter for other orthopedic aftercare: Secondary | ICD-10-CM | POA: Diagnosis not present

## 2023-03-14 DIAGNOSIS — Z96611 Presence of right artificial shoulder joint: Secondary | ICD-10-CM | POA: Diagnosis not present

## 2023-03-14 DIAGNOSIS — R531 Weakness: Secondary | ICD-10-CM | POA: Diagnosis not present

## 2023-03-19 ENCOUNTER — Encounter: Payer: Self-pay | Admitting: Allergy

## 2023-03-19 DIAGNOSIS — Z96611 Presence of right artificial shoulder joint: Secondary | ICD-10-CM | POA: Diagnosis not present

## 2023-03-19 DIAGNOSIS — R531 Weakness: Secondary | ICD-10-CM | POA: Diagnosis not present

## 2023-03-19 DIAGNOSIS — Z4789 Encounter for other orthopedic aftercare: Secondary | ICD-10-CM | POA: Diagnosis not present

## 2023-03-19 NOTE — Telephone Encounter (Signed)
Called patient - DOB/Pharmacy verified - stated she has not tested herself for COVID. Patient stated the albuterol is helping - she just wanted to know if there is anything else she can do for relief from the cough.  Patient advised message would be forwarded to provider for next step.  Patient verbalized understanding, no further questions.

## 2023-03-21 DIAGNOSIS — Z96611 Presence of right artificial shoulder joint: Secondary | ICD-10-CM | POA: Diagnosis not present

## 2023-03-21 DIAGNOSIS — Z4789 Encounter for other orthopedic aftercare: Secondary | ICD-10-CM | POA: Diagnosis not present

## 2023-03-21 DIAGNOSIS — R531 Weakness: Secondary | ICD-10-CM | POA: Diagnosis not present

## 2023-03-26 DIAGNOSIS — Z4789 Encounter for other orthopedic aftercare: Secondary | ICD-10-CM | POA: Diagnosis not present

## 2023-03-26 DIAGNOSIS — Z96611 Presence of right artificial shoulder joint: Secondary | ICD-10-CM | POA: Diagnosis not present

## 2023-03-26 DIAGNOSIS — R531 Weakness: Secondary | ICD-10-CM | POA: Diagnosis not present

## 2023-03-28 DIAGNOSIS — R531 Weakness: Secondary | ICD-10-CM | POA: Diagnosis not present

## 2023-03-28 DIAGNOSIS — Z4789 Encounter for other orthopedic aftercare: Secondary | ICD-10-CM | POA: Diagnosis not present

## 2023-03-28 DIAGNOSIS — Z96611 Presence of right artificial shoulder joint: Secondary | ICD-10-CM | POA: Diagnosis not present

## 2023-04-02 DIAGNOSIS — R531 Weakness: Secondary | ICD-10-CM | POA: Diagnosis not present

## 2023-04-02 DIAGNOSIS — Z96611 Presence of right artificial shoulder joint: Secondary | ICD-10-CM | POA: Diagnosis not present

## 2023-04-02 DIAGNOSIS — Z4789 Encounter for other orthopedic aftercare: Secondary | ICD-10-CM | POA: Diagnosis not present

## 2023-04-04 DIAGNOSIS — Z79899 Other long term (current) drug therapy: Secondary | ICD-10-CM | POA: Diagnosis not present

## 2023-04-04 DIAGNOSIS — M0579 Rheumatoid arthritis with rheumatoid factor of multiple sites without organ or systems involvement: Secondary | ICD-10-CM | POA: Diagnosis not present

## 2023-04-10 DIAGNOSIS — R531 Weakness: Secondary | ICD-10-CM | POA: Diagnosis not present

## 2023-04-10 DIAGNOSIS — Z4789 Encounter for other orthopedic aftercare: Secondary | ICD-10-CM | POA: Diagnosis not present

## 2023-04-10 DIAGNOSIS — Z96611 Presence of right artificial shoulder joint: Secondary | ICD-10-CM | POA: Diagnosis not present

## 2023-04-17 DIAGNOSIS — Z96611 Presence of right artificial shoulder joint: Secondary | ICD-10-CM | POA: Diagnosis not present

## 2023-04-17 DIAGNOSIS — R531 Weakness: Secondary | ICD-10-CM | POA: Diagnosis not present

## 2023-04-17 DIAGNOSIS — Z4789 Encounter for other orthopedic aftercare: Secondary | ICD-10-CM | POA: Diagnosis not present

## 2023-04-20 ENCOUNTER — Other Ambulatory Visit: Payer: Self-pay | Admitting: Gastroenterology

## 2023-04-24 DIAGNOSIS — Z96611 Presence of right artificial shoulder joint: Secondary | ICD-10-CM | POA: Diagnosis not present

## 2023-04-24 DIAGNOSIS — R531 Weakness: Secondary | ICD-10-CM | POA: Diagnosis not present

## 2023-04-24 DIAGNOSIS — Z4789 Encounter for other orthopedic aftercare: Secondary | ICD-10-CM | POA: Diagnosis not present

## 2023-05-01 DIAGNOSIS — Z96611 Presence of right artificial shoulder joint: Secondary | ICD-10-CM | POA: Diagnosis not present

## 2023-05-01 DIAGNOSIS — Z4789 Encounter for other orthopedic aftercare: Secondary | ICD-10-CM | POA: Diagnosis not present

## 2023-05-01 DIAGNOSIS — R531 Weakness: Secondary | ICD-10-CM | POA: Diagnosis not present

## 2023-05-03 DIAGNOSIS — Z4789 Encounter for other orthopedic aftercare: Secondary | ICD-10-CM | POA: Diagnosis not present

## 2023-05-03 DIAGNOSIS — R531 Weakness: Secondary | ICD-10-CM | POA: Diagnosis not present

## 2023-05-03 DIAGNOSIS — Z96611 Presence of right artificial shoulder joint: Secondary | ICD-10-CM | POA: Diagnosis not present

## 2023-05-06 DIAGNOSIS — Z4789 Encounter for other orthopedic aftercare: Secondary | ICD-10-CM | POA: Diagnosis not present

## 2023-05-06 DIAGNOSIS — Z96611 Presence of right artificial shoulder joint: Secondary | ICD-10-CM | POA: Diagnosis not present

## 2023-05-06 DIAGNOSIS — R531 Weakness: Secondary | ICD-10-CM | POA: Diagnosis not present

## 2023-05-07 DIAGNOSIS — R531 Weakness: Secondary | ICD-10-CM | POA: Diagnosis not present

## 2023-05-07 DIAGNOSIS — Z96611 Presence of right artificial shoulder joint: Secondary | ICD-10-CM | POA: Diagnosis not present

## 2023-05-07 DIAGNOSIS — Z4789 Encounter for other orthopedic aftercare: Secondary | ICD-10-CM | POA: Diagnosis not present

## 2023-05-14 DIAGNOSIS — Z96611 Presence of right artificial shoulder joint: Secondary | ICD-10-CM | POA: Diagnosis not present

## 2023-05-14 DIAGNOSIS — Z4789 Encounter for other orthopedic aftercare: Secondary | ICD-10-CM | POA: Diagnosis not present

## 2023-05-14 DIAGNOSIS — R531 Weakness: Secondary | ICD-10-CM | POA: Diagnosis not present

## 2023-05-16 DIAGNOSIS — R531 Weakness: Secondary | ICD-10-CM | POA: Diagnosis not present

## 2023-05-16 DIAGNOSIS — Z4789 Encounter for other orthopedic aftercare: Secondary | ICD-10-CM | POA: Diagnosis not present

## 2023-05-16 DIAGNOSIS — M0579 Rheumatoid arthritis with rheumatoid factor of multiple sites without organ or systems involvement: Secondary | ICD-10-CM | POA: Diagnosis not present

## 2023-05-16 DIAGNOSIS — Z96611 Presence of right artificial shoulder joint: Secondary | ICD-10-CM | POA: Diagnosis not present

## 2023-05-21 ENCOUNTER — Other Ambulatory Visit: Payer: Self-pay | Admitting: Pulmonary Disease

## 2023-05-29 DIAGNOSIS — E559 Vitamin D deficiency, unspecified: Secondary | ICD-10-CM | POA: Diagnosis not present

## 2023-05-29 DIAGNOSIS — E041 Nontoxic single thyroid nodule: Secondary | ICD-10-CM | POA: Diagnosis not present

## 2023-05-29 DIAGNOSIS — E785 Hyperlipidemia, unspecified: Secondary | ICD-10-CM | POA: Diagnosis not present

## 2023-05-29 DIAGNOSIS — M8580 Other specified disorders of bone density and structure, unspecified site: Secondary | ICD-10-CM | POA: Diagnosis not present

## 2023-05-29 DIAGNOSIS — I10 Essential (primary) hypertension: Secondary | ICD-10-CM | POA: Diagnosis not present

## 2023-05-29 DIAGNOSIS — E1169 Type 2 diabetes mellitus with other specified complication: Secondary | ICD-10-CM | POA: Diagnosis not present

## 2023-05-29 DIAGNOSIS — D509 Iron deficiency anemia, unspecified: Secondary | ICD-10-CM | POA: Diagnosis not present

## 2023-06-05 DIAGNOSIS — Z0000 Encounter for general adult medical examination without abnormal findings: Secondary | ICD-10-CM | POA: Diagnosis not present

## 2023-06-05 DIAGNOSIS — D84821 Immunodeficiency due to drugs: Secondary | ICD-10-CM | POA: Diagnosis not present

## 2023-06-05 DIAGNOSIS — J45909 Unspecified asthma, uncomplicated: Secondary | ICD-10-CM | POA: Diagnosis not present

## 2023-06-05 DIAGNOSIS — Z1331 Encounter for screening for depression: Secondary | ICD-10-CM | POA: Diagnosis not present

## 2023-06-05 DIAGNOSIS — E785 Hyperlipidemia, unspecified: Secondary | ICD-10-CM | POA: Diagnosis not present

## 2023-06-05 DIAGNOSIS — G4700 Insomnia, unspecified: Secondary | ICD-10-CM | POA: Diagnosis not present

## 2023-06-05 DIAGNOSIS — E1169 Type 2 diabetes mellitus with other specified complication: Secondary | ICD-10-CM | POA: Diagnosis not present

## 2023-06-05 DIAGNOSIS — I10 Essential (primary) hypertension: Secondary | ICD-10-CM | POA: Diagnosis not present

## 2023-06-05 DIAGNOSIS — T148XXA Other injury of unspecified body region, initial encounter: Secondary | ICD-10-CM | POA: Diagnosis not present

## 2023-06-05 DIAGNOSIS — R5383 Other fatigue: Secondary | ICD-10-CM | POA: Diagnosis not present

## 2023-06-05 DIAGNOSIS — D509 Iron deficiency anemia, unspecified: Secondary | ICD-10-CM | POA: Diagnosis not present

## 2023-06-05 DIAGNOSIS — Z1339 Encounter for screening examination for other mental health and behavioral disorders: Secondary | ICD-10-CM | POA: Diagnosis not present

## 2023-06-05 DIAGNOSIS — F330 Major depressive disorder, recurrent, mild: Secondary | ICD-10-CM | POA: Diagnosis not present

## 2023-06-05 DIAGNOSIS — M069 Rheumatoid arthritis, unspecified: Secondary | ICD-10-CM | POA: Diagnosis not present

## 2023-06-12 DIAGNOSIS — M25511 Pain in right shoulder: Secondary | ICD-10-CM | POA: Diagnosis not present

## 2023-06-13 ENCOUNTER — Encounter: Payer: Self-pay | Admitting: Radiology

## 2023-06-13 ENCOUNTER — Ambulatory Visit (INDEPENDENT_AMBULATORY_CARE_PROVIDER_SITE_OTHER): Payer: PPO | Admitting: Radiology

## 2023-06-13 VITALS — BP 136/72 | Ht 62.25 in | Wt 135.0 lb

## 2023-06-13 DIAGNOSIS — Z9149 Other personal history of psychological trauma, not elsewhere classified: Secondary | ICD-10-CM

## 2023-06-13 DIAGNOSIS — B009 Herpesviral infection, unspecified: Secondary | ICD-10-CM

## 2023-06-13 DIAGNOSIS — Z01419 Encounter for gynecological examination (general) (routine) without abnormal findings: Secondary | ICD-10-CM

## 2023-06-13 NOTE — Progress Notes (Signed)
   Dhana Kaine 12/13/1950 [id number]   History: Postmenopausal 72 y.o. presents for annual exam. Doing well, s/p Total hyst. Has a #0 incontinence dish pessary for cystocele, removed at home before coming in. Followed by urogyn.   Gynecologic History Postmenopausal Last Pap: 2020. Results were: normal Last mammogram: 7/22. Results were: normal Last colonoscopy: 2021 HRT use: no  Obstetric History OB History  Gravida Para Term Preterm AB Living  4       1 2   SAB IAB Ectopic Multiple Live Births  1       2    # Outcome Date GA Lbr Len/2nd Weight Sex Type Anes PTL Lv  4 Gravida           3 Gravida           2 Gravida           1 SAB              The following portions of the patient's history were reviewed and updated as appropriate: allergies, current medications, past family history, past medical history, past social history, past surgical history, and problem list.  Review of Systems Pertinent items noted in HPI and remainder of comprehensive ROS otherwise negative.  Past medical history, past surgical history, family history and social history were all reviewed and documented in the EPIC chart.  Exam:  Vitals:   06/13/23 1203  BP: 136/72  Weight: 135 lb (61.2 kg)  Height: 5' 2.25" (1.581 m)   Body mass index is 24.49 kg/m.  General appearance:  Normal Thyroid:  Symmetrical, normal in size, without palpable masses or nodularity. Respiratory  Auscultation:  Clear without wheezing or rhonchi Cardiovascular  Auscultation:  Regular rate, without rubs, murmurs or gallops  Edema/varicosities:  Not grossly evident Abdominal  Soft,nontender, without masses, guarding or rebound.  Liver/spleen:  No organomegaly noted  Hernia:  None appreciated  Skin  Inspection:  Grossly normal Breasts: Examined lying and sitting.   Right: Without masses, retractions, nipple discharge or axillary adenopathy.   Left: Without masses, retractions, nipple discharge or axillary  adenopathy. Genitourinary   Inguinal/mons:  Normal without inguinal adenopathy  External genitalia:  Normal appearing vulva with no masses, tenderness, or lesions  BUS/Urethra/Skene's glands:  Normal  Vagina:  Normal appearing with normal color and discharge, no lesions. Atrophy: moderate. Cystocele, stage 2. #0 incontinence dish pessary replaced easily.  Cervix:  absent  Uterus:  absent  Adnexa/parametria:  absent  Anus and perineum: Normal    Raynelle Fanning, CMA present for exam  Assessment/Plan:   1. Encounter for breast and pelvic examination Schedule mammogram Up to date otherwise on screenings  Discussed SBE, colonoscopy and DEXA screening as directed. Recommend of exercise weekly, including weight bearing exercise. Encouraged the use of seatbelts and sunscreen.  Return in 1 year for annual or sooner prn.  Tanda Rockers WHNP-BC, 12:32 PM 06/13/2023

## 2023-06-13 NOTE — Progress Notes (Addendum)
06/14/2023 Courtney Crane [id number] 1950-12-22  Referring provider: Alysia Penna, MD Primary GI doctor: Dr. Barron Alvine (Dr. Christella Hartigan)  ASSESSMENT AND PLAN:   GERD with epigastric pain, weight loss, new IDA per patient, on iron Last colon 2020 for FOBT + tic, hemorrhoids, normal EGD 2021 with gastritis Will try to get records from PCP patient started on iron, now with worsening GERD not controlled on prilosec 40 mg once daily and pepcid at night.  Will increase prilosec to BID, continue pepcid Schedule EGD at [facility] Will schedule EGD. I discussed risks of EGD with patient today, including risk of sedation, bleeding or perforation.  Patient provides understanding and gave verbal consent to proceed. Will get AB [place] with discomfort and previous CT showing dilated bile ducts, most like post cholecystectomy but will rule out any issues  Iron deficiency anemia, unspecified iron deficiency anemia type Will try to get records from PCP If true IDA can consider adding on colon or getting colon if EGD negative versus capsule endo  Addendum: 05/29/23 Iron saturation 12, iron 57, ferritin 26.7  Patient Care Team: Alysia Penna, MD as PCP - General (Internal Medicine) Glendale Chard, DO as Referring Physician (Neurology)  HISTORY OF PRESENT ILLNESS: 72 y.o. female with a past medical history of anxiety, arthritis, diverticulosis, colon polyps, fatty liver, GERD hypertension, hyperlipidemia, rheumatoid arthritis, status postcholecystectomy and appendectomy and others listed below presents for evaluation of medication refill.   Review of pertinent gastrointestinal problems: 1. Recurrent acute diverticulitis: established care Dr. Christella Hartigan 2017; previously at least 3 attacks of diverticulitis.  The pain is usually LLQ, lasting 5-10 days, cipro/flagyl, has had CT proof in past.  Most recent attack 2015 while moving to [place].  Put on antibiotics and her symptoms resolved. Usually gets 'low grade  fevers.'  She has had 4-5 attacks of pain.  Never hospitalized. 2. Routine risk for colon cancer:  Colonoscopy 01/2016 Dr. Christella Hartigan: pan-diverticulosis, no polyps. Recall at 10 years recommended.   09/2019 1 repeat colonoscopy secondary to positive FOBT stool showed left-sided diverticulosis and internal hemorrhoids which were small. 3. Symptomatic gallstones: s/p lap chole 01/2017, normal IOC 4.  Mild gastritis, EGD March 2021 showed mild gastritis, biopsies showed no sign of infection or cancer.  Once daily omeprazole helped her symptoms, she was greatly improved.  12/27/2021 office visit with Dr. Christella Hartigan for hemorrhoids. 08/01/2022 office visit with Dr. Janee Morn [facility] for hemorrhoids, significant improvement with internal and external ointments. 12/06/2022 underwent right reverse shoulder arthroplasty with Dr. Ave Filter, she states she did very well with this procedure.  Patient follows with [facility] for shortness of breath/asthma  She is on the omeprazole 40 mg once day, has been out of it and has been having worsening reflux.  However even before she has run out of her omeprazole she was still taking tums and gas x.  She has had really bad gas, increasing noise and indigestion.  She has GERD all day, worse in the evenings. She has dry cough but has bad allergies/asthma, on dupixent and inhaler.  She takes the pepcid at night.  Denies dysphagia, nausea, odynophagia.  She has had decreased appetite, gets full quickly, eats 1/2 or 1/3 of what she use to eat.  She has had weight loss but has cut back on carbs with 30 lbs weight loss.  She was started on an iron supplement due to low iron, labs at [facility].  She has had dark stool since being on iron but  no tarry stools. Has had some loose stools, she always has had it. She takes miralax and fiber.   She denies blood thinner use.  She denies NSAID use.  She reports ETOH use, glass of wine every evening.  She denies  tobacco use.  She denies drug use.    She  reports that she has never smoked. She has never been exposed to tobacco smoke. She has never used smokeless tobacco. She reports current alcohol use of about 7.0 standard drinks of alcohol per week. She reports that she does not use drugs.  RELEVANT LABS AND IMAGING: CBC    Component Value Date/Time   WBC 6.2 12/04/2022 1422   RBC 4.09 12/04/2022 1422   HGB 13.2 12/04/2022 1422   HCT 39.9 12/04/2022 1422   PLT 166 12/04/2022 1422   MCV 97.6 12/04/2022 1422   MCH 32.3 12/04/2022 1422   MCHC 33.1 12/04/2022 1422   RDW 13.1 12/04/2022 1422   LYMPHSABS 2.1 05/17/2022 1016   MONOABS 1.0 05/17/2022 1016   EOSABS 0.4 05/17/2022 1016   BASOSABS 0.1 05/17/2022 1016   Recent Labs    12/04/22 1422  HGB 13.2    CMP     Component Value Date/Time   NA 136 12/04/2022 1422   K 3.6 12/04/2022 1422   CL 101 12/04/2022 1422   CO2 24 12/04/2022 1422   GLUCOSE 114 (H) 12/04/2022 1422   BUN 16 12/04/2022 1422   CREATININE 0.92 12/04/2022 1422   CALCIUM 9.3 12/04/2022 1422   PROT 7.2 08/31/2021 1525   ALBUMIN 3.6 08/31/2021 1525   AST 42 (H) 08/31/2021 1525   ALT 41 08/31/2021 1525   ALKPHOS 49 08/31/2021 1525   BILITOT 0.6 08/31/2021 1525   GFRNONAA >60 12/04/2022 1422   GFRAA >60 12/05/2017 0628      Latest Ref Rng & Units 08/31/2021    3:25 PM 01/19/2021    2:00 PM 12/05/2017    6:28 AM  Hepatic Function  Total Protein 6.5 - 8.1 g/dL 7.2  8.5  6.3   Albumin 3.5 - 5.0 g/dL 3.6  4.2  2.9   AST 15 - 41 U/L 42  32  162   ALT 0 - 44 U/L 41  38  202   Alk Phosphatase 38 - 126 U/L 49  48  112   Total Bilirubin 0.3 - 1.2 mg/dL 0.6  0.8  0.5   Bilirubin, Direct 0.0 - 0.2 mg/dL 0.2         Current Medications:    Current Outpatient Medications (Cardiovascular):    amLODipine (NORVASC) 10 MG tablet, Take 10 mg by mouth in the morning.   benazepril-hydrochlorthiazide (LOTENSIN HCT) 10-12.5 MG tablet,    losartan (COZAAR) 100 MG tablet,  Take 100 mg by mouth daily.   rosuvastatin (CRESTOR) 5 MG tablet, Take 5 mg by mouth every evening.  Current Outpatient Medications (Respiratory):    albuterol (VENTOLIN HFA) 108 (90 Base) MCG/ACT inhaler, Inhale 2 puffs into the lungs every 6 (six) hours as needed for wheezing or shortness of breath.   BREZTRI AEROSPHERE 160-9-4.8 MCG/ACT AERO, INHALE 2 PUFFS INTO THE LUNGS IN THE MORNING AND AT BEDTIME.   fexofenadine (ALLEGRA) 180 MG tablet, Take 90 mg by mouth in the morning.   ipratropium (ATROVENT) 0.06 % nasal spray, Place 2 sprays into both nostrils 2 (two) times daily. (Patient taking differently: Place 2 sprays into both nostrils in the morning.)   sodium chloride (OCEAN) 0.65 % SOLN nasal  spray, Place 1 spray into both nostrils in the morning and at bedtime.  Current Outpatient Medications (Analgesics):    acetaminophen (TYLENOL) 500 MG tablet, Take 1,000 mg by mouth every 6 (six) hours as needed (pain.).   leflunomide (ARAVA) 20 MG tablet, Take 20 mg by mouth every evening.  Current Outpatient Medications (Hematological):    Ferrous Gluconate (IRON) 240 (27 Fe) MG TABS,    Ferrous Sulfate (CVS SLOW RELEASE IRON PO), Take 65 mg by mouth 3 (three) times a week.  Current Outpatient Medications (Other):    ascorbic acid (VITAMIN C) 100 MG tablet, Take by mouth.   bismuth subsalicylate (PEPTO BISMOL) 262 MG chewable tablet, Chew 262-524 mg by mouth 3 (three) times daily as needed for diarrhea or loose stools or indigestion.   Calcium Carb-Cholecalciferol (CALCIUM 600+D3 PO), Take 1 tablet by mouth in the morning.   calcium elemental as carbonate (TUMS ULTRA 1000) 400 MG chewable tablet, Chew 1,000 mg by mouth 3 (three) times daily as needed for heartburn.   CANNABIDIOL PO, Take 1 capsule by mouth at bedtime. CBD   Cholecalciferol (VITAMIN D-3) 125 MCG (5000 UT) TABS, Take 5,000 Units by mouth in the morning.   Dupilumab (DUPIXENT) 300 MG/2ML SOPN, Inject 300 mg into the skin every 14  (fourteen) days.   famotidine (PEPCID) 40 MG tablet, TAKE 1 TABLET BY MOUTH EVERYDAY AT BEDTIME   Magnesium 250 MG TABS, Take 250 mg by mouth every evening.   MELATONIN PO, Take 1.5 mg by mouth at bedtime as needed (sleep).   mirabegron ER (MYRBETRIQ) 25 MG TB24 tablet, Take 1 tablet (25 mg total) by mouth daily.   Multiple Vitamins-Minerals (IMMUNE SUPPORT VITAMIN C PO), Take 1 tablet by mouth in the morning. With Vitamin D 3/Vitamin A/Magnesium   Omega-3 Fatty Acids (FISH OIL) 1200 MG CAPS, Take 1,200 mg by mouth 2 (two) times daily.   omeprazole (PRILOSEC) 40 MG capsule, Take 1 capsule (40 mg total) by mouth 2 (two) times daily before a meal.   polyethylene glycol (MIRALAX / GLYCOLAX) 17 g packet, Take 17 g by mouth every Monday, Wednesday, and Friday. In the morning   Psyllium (METAMUCIL 4 IN 1 FIBER PO), Take 2 capsules by mouth in the morning. Metamucil 3-in-1 Psyllium Fiber Supplement Capsules   simethicone (MYLICON) 125 MG chewable tablet, Chew 125 mg by mouth every 6 (six) hours as needed for flatulence.   valACYclovir (VALTREX) 500 MG tablet, Take 500 mg by mouth daily as needed (outbreaks).  Medical History:  Past Medical History:  Diagnosis Date   Anemia    Anxiety    Arthritis    Asthma    Cataract    Colon polyps    Complication of anesthesia    "sometimes I dont like to wake up after " ie difficult to awaken    Diverticulitis    Diverticulosis    Fatty liver    GERD (gastroesophageal reflux disease)    HLD (hyperlipidemia)    HTN (hypertension)    IBS (irritable bowel syndrome)    Insomnia    MRSA infection    Osteoarthritis    Osteopenia    PONV (postoperative nausea and vomiting)    RA (rheumatoid arthritis) (HCC)    Rheumatoid arthritis (HCC)    Seasonal allergies    Shingles    Status post dilation of esophageal narrowing    Vitamin D deficiency    Allergies:  Allergies  Allergen Reactions   Meperidine Nausea Only  Methotrexate Other (See Comments)     Unknown reaction   Nickel Rash   Tape Rash and Other (See Comments)     Surgical History:  She  has a past surgical history that includes Appendectomy (1975); Total abdominal hysterectomy (1988); Tonsillectomy; bladder tack (1990); Rotator cuff repair (Right, 2005); Wrist reconstruction (Right, 2008); Meniscus repair (Right, 2013); Total knee arthroplasty (Left); Cholecystectomy (N/A, 01/31/2017); Incision and drainage perirectal abscess (N/A, 12/02/2017); Cataract extraction, bilateral (Bilateral); Adenoidectomy; Lumbar fusion; and Reverse shoulder arthroplasty (Right, 12/06/2022). Family History:  Her family history includes Arthritis in her mother; CAD in her brother; Colon polyps in her mother and son; Dementia in her father; Diabetes in her mother; Esophageal cancer in her brother; Heart disease in her mother; Hyperlipidemia in her mother; Hypertension in her mother; Kidney cancer in her mother.  REVIEW OF SYSTEMS  : All other systems reviewed and negative except where noted in the History of Present Illness.  PHYSICAL EXAM: BP 136/64   Pulse 68   Ht 5\' 2"  (1.575 m)   Wt 135 lb 12.8 oz (61.6 kg)   SpO2 98%   BMI 24.84 kg/m  General Appearance: Well nourished, in no apparent distress. Head:   Normocephalic and atraumatic. Eyes:  sclerae anicteric,conjunctive pink  Respiratory: Respiratory effort normal, BS equal bilaterally without rales, rhonchi, wheezing. Cardio: RRR with no MRGs. Peripheral pulses intact.  Abdomen: Soft,  Obese ,active bowel sounds. moderate tenderness in the epigastrium. Without guarding and Without rebound. No masses. Rectal: Not evaluated Musculoskeletal: Full ROM, Normal gait. Without edema. Skin:  Dry and intact without significant lesions or rashes Neuro: Alert and  oriented x4;  No focal deficits. Psych:  Cooperative. Normal mood and affect.    Doree Albee, PA-C 2:50 PM

## 2023-06-14 ENCOUNTER — Encounter: Payer: Self-pay | Admitting: Physician Assistant

## 2023-06-14 ENCOUNTER — Ambulatory Visit (INDEPENDENT_AMBULATORY_CARE_PROVIDER_SITE_OTHER): Payer: PPO | Admitting: Physician Assistant

## 2023-06-14 VITALS — BP 136/64 | HR 68 | Ht 62.0 in | Wt 135.8 lb

## 2023-06-14 DIAGNOSIS — K219 Gastro-esophageal reflux disease without esophagitis: Secondary | ICD-10-CM | POA: Diagnosis not present

## 2023-06-14 DIAGNOSIS — R1013 Epigastric pain: Secondary | ICD-10-CM

## 2023-06-14 DIAGNOSIS — D509 Iron deficiency anemia, unspecified: Secondary | ICD-10-CM

## 2023-06-14 MED ORDER — OMEPRAZOLE 40 MG PO CPDR: 40.0000 mg | DELAYED_RELEASE_CAPSULE | Freq: Two times a day (BID) | ORAL | 0 refills | Status: DC

## 2023-06-14 NOTE — Patient Instructions (Signed)
Please take your proton pump inhibitor medication, omeprazole 40 mg BID, can continue the pepcid  Please take this medication 30 minutes to 1 hour before meals- this makes it more effective.  Avoid spicy and acidic foods Avoid fatty foods Limit your intake of coffee, tea, alcohol, and carbonated drinks Work to maintain a healthy weight Keep the head of the bed elevated at least 3 inches with blocks or a wedge pillow if you are having any nighttime symptoms Stay upright for 2 hours after eating Avoid meals and snacks three to four hours before bedtime  _______________________________________________________  If your blood pressure at your visit was 140/90 or greater, please contact your primary care physician to follow up on this.  We have sent the following medications to your pharmacy for you to pick up at your convenience: Omeprazole   You have been scheduled for an abdominal ultrasound at [facility] (1st floor of hospital) on 06/17/2023 at 10:30am. Please arrive 30 minutes prior to your appointment for registration. Make certain not to have anything to eat or drink 6 hours prior to your appointment. Should you need to reschedule your appointment, please contact radiology at [phone]. This test typically takes about 30 minutes to perform.  _______________________________________________________  If your blood pressure at your visit was 140/90 or greater, please contact your primary care physician to follow up on this.  _______________________________________________________  If you are age 27 or older, your body mass index should be between 23-30. Your Body mass index is 24.84 kg/m. If this is out of the aforementioned range listed, please consider follow up with your Primary Care Provider.  If you are age 21 or younger, your body mass index should be between 19-25. Your Body mass index is 24.84 kg/m. If this is out of the aformentioned range listed, please consider  follow up with your Primary Care Provider.   ________________________________________________________  The [place] [facility] providers would like to encourage you to use [facility] to communicate with providers for non-urgent requests or questions.  Due to long hold times on the telephone, sending your provider a message by [facility] may be a faster and more efficient way to get a response.  Please allow 48 business hours for a response.  Please remember that this is for non-urgent requests.  _______________________________________________________ It was a pleasure to see you today!  Thank you for trusting me with your gastrointestinal care!

## 2023-06-17 ENCOUNTER — Ambulatory Visit (HOSPITAL_COMMUNITY)
Admission: RE | Admit: 2023-06-17 | Discharge: 2023-06-17 | Disposition: A | Discharge status: Home / Self Care | Payer: PPO | Source: Ambulatory Visit | Attending: Physician Assistant | Admitting: Physician Assistant

## 2023-06-17 DIAGNOSIS — K219 Gastro-esophageal reflux disease without esophagitis: Secondary | ICD-10-CM | POA: Diagnosis not present

## 2023-06-17 DIAGNOSIS — K760 Fatty (change of) liver, not elsewhere classified: Secondary | ICD-10-CM | POA: Diagnosis not present

## 2023-06-17 DIAGNOSIS — R1013 Epigastric pain: Secondary | ICD-10-CM | POA: Diagnosis not present

## 2023-06-17 DIAGNOSIS — R634 Abnormal weight loss: Secondary | ICD-10-CM | POA: Diagnosis not present

## 2023-06-17 DIAGNOSIS — Z9049 Acquired absence of other specified parts of digestive tract: Secondary | ICD-10-CM | POA: Diagnosis not present

## 2023-06-24 ENCOUNTER — Other Ambulatory Visit: Payer: Self-pay | Admitting: *Deleted

## 2023-06-24 DIAGNOSIS — K8689 Other specified diseases of pancreas: Secondary | ICD-10-CM

## 2023-06-26 ENCOUNTER — Telehealth: Payer: Self-pay | Admitting: *Deleted

## 2023-06-26 NOTE — Telephone Encounter (Signed)
Patient called and informed a CT ABD with contrast was ordered and Imaging will be calling to notify her for scheduled appt. Patient also wanted to know about labs. Informed the patient labs for Hepatic function was also ordered and she can have lab draws at her convenience. Patient understood and agreed.

## 2023-06-28 ENCOUNTER — Other Ambulatory Visit (INDEPENDENT_AMBULATORY_CARE_PROVIDER_SITE_OTHER): Payer: PPO

## 2023-06-28 DIAGNOSIS — K8689 Other specified diseases of pancreas: Secondary | ICD-10-CM | POA: Diagnosis not present

## 2023-06-28 LAB — HEPATIC FUNCTION PANEL
ALT: 20 U/L (ref 0–35)
AST: 21 U/L (ref 0–37)
Albumin: 4.1 g/dL (ref 3.5–5.2)
Alkaline Phosphatase: 49 U/L (ref 39–117)
Bilirubin, Direct: 0 mg/dL (ref 0.0–0.3)
Total Bilirubin: 0.3 mg/dL (ref 0.2–1.2)
Total Protein: 7.7 g/dL (ref 6.0–8.3)

## 2023-07-01 DIAGNOSIS — Z79899 Other long term (current) drug therapy: Secondary | ICD-10-CM | POA: Diagnosis not present

## 2023-07-01 DIAGNOSIS — M0579 Rheumatoid arthritis with rheumatoid factor of multiple sites without organ or systems involvement: Secondary | ICD-10-CM | POA: Diagnosis not present

## 2023-07-01 DIAGNOSIS — Z111 Encounter for screening for respiratory tuberculosis: Secondary | ICD-10-CM | POA: Diagnosis not present

## 2023-07-01 DIAGNOSIS — R5383 Other fatigue: Secondary | ICD-10-CM | POA: Diagnosis not present

## 2023-07-03 ENCOUNTER — Ambulatory Visit (HOSPITAL_COMMUNITY)
Admission: RE | Admit: 2023-07-03 | Discharge: 2023-07-03 | Disposition: A | Discharge status: Home / Self Care | Payer: PPO | Source: Ambulatory Visit | Attending: Physician Assistant | Admitting: Physician Assistant

## 2023-07-03 DIAGNOSIS — N289 Disorder of kidney and ureter, unspecified: Secondary | ICD-10-CM | POA: Diagnosis not present

## 2023-07-03 DIAGNOSIS — K8689 Other specified diseases of pancreas: Secondary | ICD-10-CM | POA: Insufficient documentation

## 2023-07-03 DIAGNOSIS — K5730 Diverticulosis of large intestine without perforation or abscess without bleeding: Secondary | ICD-10-CM | POA: Diagnosis not present

## 2023-07-03 MED ORDER — SODIUM CHLORIDE (PF) 0.9 % IJ SOLN
INTRAMUSCULAR | Status: AC
  Filled 2023-07-03: qty 50

## 2023-07-03 MED ORDER — IOHEXOL 300 MG/ML  SOLN
100.0000 mL | Freq: Once | INTRAMUSCULAR | Status: AC | PRN
  Administered 2023-07-03: 100 mL via INTRAVENOUS

## 2023-07-05 ENCOUNTER — Ambulatory Visit: Payer: PPO | Admitting: Allergy

## 2023-07-05 ENCOUNTER — Encounter: Payer: Self-pay | Admitting: Allergy

## 2023-07-05 ENCOUNTER — Other Ambulatory Visit: Payer: Self-pay

## 2023-07-05 VITALS — BP 130/70 | HR 82 | Temp 98.8°F | Ht 62.0 in | Wt 136.7 lb

## 2023-07-05 DIAGNOSIS — J4530 Mild persistent asthma, uncomplicated: Secondary | ICD-10-CM | POA: Diagnosis not present

## 2023-07-05 DIAGNOSIS — H1013 Acute atopic conjunctivitis, bilateral: Secondary | ICD-10-CM

## 2023-07-05 DIAGNOSIS — J3089 Other allergic rhinitis: Secondary | ICD-10-CM | POA: Diagnosis not present

## 2023-07-05 MED ORDER — FEXOFENADINE HCL 180 MG PO TABS: 90.0000 mg | ORAL_TABLET | Freq: Every morning | ORAL | 5 refills | Status: AC

## 2023-07-05 MED ORDER — ALBUTEROL SULFATE HFA 108 (90 BASE) MCG/ACT IN AERS: 2.0000 | INHALATION_SPRAY | Freq: Four times a day (QID) | RESPIRATORY_TRACT | 1 refills | Status: DC | PRN

## 2023-07-05 MED ORDER — IPRATROPIUM BROMIDE 0.06 % NA SOLN: 1.0000 | Freq: Every day | NASAL | 5 refills | Status: DC

## 2023-07-05 MED ORDER — BREZTRI AEROSPHERE 160-9-4.8 MCG/ACT IN AERO: 2.0000 | INHALATION_SPRAY | Freq: Two times a day (BID) | RESPIRATORY_TRACT | 5 refills | Status: DC

## 2023-07-05 MED ORDER — OLOPATADINE HCL 0.2 % OP SOLN: 1.0000 [drp] | Freq: Every day | OPHTHALMIC | 5 refills | Status: DC | PRN

## 2023-07-05 NOTE — Patient Instructions (Addendum)
-   Continue Dupixent injections every 2 weeks.  Rotate your injection areas.  - Continue Breztri 1 puffs twice a day.   If having a respiratory illness for flare then increase to 2 puffs twice a day for 1-2 weeks or until symptoms are resolve then can go back down to 1 puff twice a day.  If remains under good control with Breztri 1 puff twice a day at next visit then will consider stepping down to a double agent inhaler. - have access to albuterol inhaler 2 puffs every 4-6 hours as needed for cough/wheeze/shortness of breath/chest tightness.  May use 15-20 minutes prior to activity.   Monitor frequency of use.    Breathing control goals:  Full participation in all desired activities (may need albuterol before activity) Albuterol use two time or less a week on average (not counting use with activity) Cough interfering with sleep two time or less a month Oral steroids no more than once a year No hospitalizations  - Continue avoidance measures for dust mites, cat, dog, tree pollen, grass pollen, weed pollens.   - continue 1/2 tab Allegra daily.   May take additional 1/2 tab if needed - continue Atrovent nasal spray 1 spray daily.  For nasal drainage/runny nose can take up to 3-4 times a day if needed - for itch/watery eyes can use OTC Pataday or Pataday extra strength 1 drop each eye daily as needed - if medication management is not effective then can consider course of allergen immunotherapy  Follow-up in 6 months or sooner if needed

## 2023-07-05 NOTE — Progress Notes (Signed)
Follow-up Note  RE: Courtney Crane MRN: [id number] DOB: 05/15/1951 Date of Office Visit: 07/05/2023   History of present illness: Courtney Crane is a 72 y.o. female presenting today for follow-up of asthma, allergic rhinitis with conjunctivitis.  She was last seen in the office on 01/04/2023 by myself.  She has not had any health changes, surgeries or hospitalizations since last visit.  In April she did have a flare where she was having some increased difficulty and coughing.  She did not have any signs concerning for COVID or respiratory illness.  I advised that she could use Mucinex DM to help sting mucus and help with the cough.  This seemed to be helpful.  She did use albuterol during this time.  She did not need to go to an urgent care or the ED where she required any systemic steroids or antibiotics for this.  She does continue on Dupixent injections every 2 weeks that she self administers and rotates her sides of the abdomen.  She continues on Breztri inhaler and about a month ago she did decrease down to 1 puff twice a day to see if she would do well.  She has done well thus far and has not had any increase in symptoms. With her allergy symptoms she states they have been quite stable and she does not report any increase in nasal, ocular or generalized allergy symptoms.  She will take half tablet of Allegra daily.  If she knows she is going to her son's house that does have a dog she will take a full tablet.  She has had occasional use of the allergy based eyedrop.  She does use the nasal Atrovent on a daily basis which does help prevent nasal drainage. Last visit I did notice thrush on the tongue and did treat that with nystatin.   Review of systems: 10pt ROS negative unless noted above in HPI  Past medical/social/surgical/family history have been reviewed and are unchanged unless specifically indicated below.  No changes  Medication List: Current Outpatient Medications   Medication Sig Dispense Refill   acetaminophen (TYLENOL) 500 MG tablet Take 1,000 mg by mouth every 6 (six) hours as needed (pain.).     albuterol (VENTOLIN HFA) 108 (90 Base) MCG/ACT inhaler Inhale 2 puffs into the lungs every 6 (six) hours as needed for wheezing or shortness of breath. 8 g 6   amLODipine (NORVASC) 10 MG tablet Take 10 mg by mouth in the morning.     ascorbic acid (VITAMIN C) 100 MG tablet Take by mouth.     benazepril-hydrochlorthiazide (LOTENSIN HCT) 10-12.5 MG tablet      bismuth subsalicylate (PEPTO BISMOL) 262 MG chewable tablet Chew 262-524 mg by mouth 3 (three) times daily as needed for diarrhea or loose stools or indigestion.     BREZTRI AEROSPHERE 160-9-4.8 MCG/ACT AERO INHALE 2 PUFFS INTO THE LUNGS IN THE MORNING AND AT BEDTIME. 10.7 each 1   Calcium Carb-Cholecalciferol (CALCIUM 600+D3 PO) Take 1 tablet by mouth in the morning.     calcium elemental as carbonate (TUMS ULTRA 1000) 400 MG chewable tablet Chew 1,000 mg by mouth 3 (three) times daily as needed for heartburn.     CANNABIDIOL PO Take 1 capsule by mouth at bedtime. CBD     Cholecalciferol (VITAMIN D-3) 125 MCG (5000 UT) TABS Take 5,000 Units by mouth in the morning.     Dupilumab (DUPIXENT) 300 MG/2ML SOPN Inject 300 mg into the skin every 14 (fourteen) days. 12  mL 2   famotidine (PEPCID) 40 MG tablet TAKE 1 TABLET BY MOUTH EVERYDAY AT BEDTIME 90 tablet 3   Ferrous Gluconate (IRON) 240 (27 Fe) MG TABS      Ferrous Sulfate (CVS SLOW RELEASE IRON PO) Take 65 mg by mouth 3 (three) times a week.     fexofenadine (ALLEGRA) 180 MG tablet Take 90 mg by mouth in the morning.     ipratropium (ATROVENT) 0.06 % nasal spray Place 2 sprays into both nostrils 2 (two) times daily. (Patient taking differently: Place 2 sprays into both nostrils in the morning.) 15 mL 5   leflunomide (ARAVA) 20 MG tablet Take 20 mg by mouth every evening.     losartan (COZAAR) 100 MG tablet Take 100 mg by mouth daily.     Magnesium 250 MG  TABS Take 250 mg by mouth every evening.     MELATONIN PO Take 1.5 mg by mouth at bedtime as needed (sleep).     mirabegron ER (MYRBETRIQ) 25 MG TB24 tablet Take 1 tablet (25 mg total) by mouth daily. 90 tablet 3   Multiple Vitamins-Minerals (IMMUNE SUPPORT VITAMIN C PO) Take 1 tablet by mouth in the morning. With Vitamin D 3/Vitamin A/Magnesium     Omega-3 Fatty Acids (FISH OIL) 1200 MG CAPS Take 1,200 mg by mouth 2 (two) times daily.     omeprazole (PRILOSEC) 40 MG capsule Take 1 capsule (40 mg total) by mouth 2 (two) times daily before a meal. 180 capsule 0   polyethylene glycol (MIRALAX / GLYCOLAX) 17 g packet Take 17 g by mouth every Monday, Wednesday, and Friday. In the morning     Psyllium (METAMUCIL 4 IN 1 FIBER PO) Take 2 capsules by mouth in the morning. Metamucil 3-in-1 Psyllium Fiber Supplement Capsules     rosuvastatin (CRESTOR) 5 MG tablet Take 5 mg by mouth every evening.     simethicone (MYLICON) 125 MG chewable tablet Chew 125 mg by mouth every 6 (six) hours as needed for flatulence.     sodium chloride (OCEAN) 0.65 % SOLN nasal spray Place 1 spray into both nostrils in the morning and at bedtime.     valACYclovir (VALTREX) 500 MG tablet Take 500 mg by mouth daily as needed (outbreaks).     No current facility-administered medications for this visit.     Known medication allergies: Allergies  Allergen Reactions   Meperidine Nausea Only   Methotrexate Other (See Comments)    Unknown reaction   Nickel Rash   Tape Rash and Other (See Comments)     Physical examination: Blood pressure 130/70, pulse 82, temperature 98.8 F (37.1 C), height 5\' 2"  (1.575 m), weight 136 lb 11.2 oz (62 kg), SpO2 96%.  General: Alert, interactive, in no acute distress. HEENT: PERRLA, TMs pearly gray, turbinates non-edematous without discharge, post-pharynx non erythematous. Neck: Supple without lymphadenopathy. Lungs: Clear to auscultation without wheezing, rhonchi or rales. {no increased  work of breathing. CV: Normal S1, S2 without murmurs. Abdomen: Nondistended, nontender. Skin: Warm and dry, without lesions or rashes. Extremities:  No clubbing, cyanosis or edema. Neuro:   Grossly intact.  Diagnositics/Labs:  Spirometry: FEV1: 1.93L 97%, FVC: 2.51L 97%, ratio consistent with nonobstructive pattern  Assessment and plan: Asthma  -At this time under good control - Continue Dupixent injections every 2 weeks.  Rotate your injection areas.  - Continue Breztri 1 puffs twice a day.   If having a respiratory illness for flare then increase to 2 puffs twice a day  for 1-2 weeks or until symptoms are resolve then can go back down to 1 puff twice a day.  If remains under good control with Breztri 1 puff twice a day at next visit then will consider stepping down to a double agent inhaler. - have access to albuterol inhaler 2 puffs every 4-6 hours as needed for cough/wheeze/shortness of breath/chest tightness.  May use 15-20 minutes prior to activity.   Monitor frequency of use.    Breathing control goals:  Full participation in all desired activities (may need albuterol before activity) Albuterol use two time or less a week on average (not counting use with activity) Cough interfering with sleep two time or less a month Oral steroids no more than once a year No hospitalizations  Allergic rhinitis with conjunctivitis - Continue avoidance measures for dust mites, cat, dog, tree pollen, grass pollen, weed pollens.   - continue 1/2 tab Allegra daily.   May take additional 1/2 tab if needed - continue Atrovent nasal spray 1 spray daily.  For nasal drainage/runny nose can take up to 3-4 times a day if needed - for itch/watery eyes can use OTC Pataday or Pataday extra strength 1 drop each eye daily as needed - if medication management is not effective then can consider course of allergen immunotherapy  Follow-up in 6 months or sooner if needed  I appreciate the opportunity to take  part in [facility] care. Please do not hesitate to contact me with questions.  Sincerely,   Margo Aye, MD Allergy/Immunology [facility]

## 2023-07-06 ENCOUNTER — Encounter: Payer: Self-pay | Admitting: Allergy

## 2023-07-11 DIAGNOSIS — M48062 Spinal stenosis, lumbar region with neurogenic claudication: Secondary | ICD-10-CM | POA: Diagnosis not present

## 2023-07-12 NOTE — Progress Notes (Signed)
Agree with the assessment and plan as outlined by Amanda Collier, PA-C. ? ?Vito Cirigliano, DO, FACG ? ?

## 2023-07-15 ENCOUNTER — Other Ambulatory Visit (HOSPITAL_COMMUNITY): Payer: PPO

## 2023-07-16 DIAGNOSIS — Z79899 Other long term (current) drug therapy: Secondary | ICD-10-CM | POA: Diagnosis not present

## 2023-07-16 DIAGNOSIS — Z6824 Body mass index (BMI) 24.0-24.9, adult: Secondary | ICD-10-CM | POA: Diagnosis not present

## 2023-07-16 DIAGNOSIS — M1991 Primary osteoarthritis, unspecified site: Secondary | ICD-10-CM | POA: Diagnosis not present

## 2023-07-16 DIAGNOSIS — M0579 Rheumatoid arthritis with rheumatoid factor of multiple sites without organ or systems involvement: Secondary | ICD-10-CM | POA: Diagnosis not present

## 2023-07-17 ENCOUNTER — Encounter: Payer: Self-pay | Admitting: Gastroenterology

## 2023-07-19 NOTE — Telephone Encounter (Signed)
Left a message on patients home and cell phone to call me regarding this matter.

## 2023-07-22 ENCOUNTER — Encounter: Payer: Self-pay | Admitting: Certified Registered Nurse Anesthetist

## 2023-07-23 NOTE — Telephone Encounter (Signed)
Spoke with patient, Informed her of the miscommunication regarding her prescriptions. Patient stated that she rely's on MyChart to communicate with her providers regarding her care and she needs to have everything correct. Patient is not able to take the generic allegra, and due to insurance not covering Allegra patient buys this over the counter. Patient also gets the Pataday over the counter as her insurance does not cover it either. Patient would like it noted to please not to send the generic allegra or the Pataday into the Pharmacy anymore.

## 2023-07-29 ENCOUNTER — Ambulatory Visit (AMBULATORY_SURGERY_CENTER): Payer: PPO | Admitting: Gastroenterology

## 2023-07-29 ENCOUNTER — Encounter: Payer: Self-pay | Admitting: Gastroenterology

## 2023-07-29 VITALS — BP 138/63 | HR 76 | Temp 98.0°F | Resp 10 | Ht 62.0 in | Wt 133.0 lb

## 2023-07-29 DIAGNOSIS — K2970 Gastritis, unspecified, without bleeding: Secondary | ICD-10-CM

## 2023-07-29 DIAGNOSIS — M069 Rheumatoid arthritis, unspecified: Secondary | ICD-10-CM | POA: Diagnosis not present

## 2023-07-29 DIAGNOSIS — K219 Gastro-esophageal reflux disease without esophagitis: Secondary | ICD-10-CM | POA: Diagnosis not present

## 2023-07-29 DIAGNOSIS — K317 Polyp of stomach and duodenum: Secondary | ICD-10-CM

## 2023-07-29 DIAGNOSIS — R140 Abdominal distension (gaseous): Secondary | ICD-10-CM | POA: Diagnosis not present

## 2023-07-29 DIAGNOSIS — K2950 Unspecified chronic gastritis without bleeding: Secondary | ICD-10-CM | POA: Diagnosis not present

## 2023-07-29 DIAGNOSIS — R1013 Epigastric pain: Secondary | ICD-10-CM | POA: Diagnosis not present

## 2023-07-29 DIAGNOSIS — R1010 Upper abdominal pain, unspecified: Secondary | ICD-10-CM

## 2023-07-29 DIAGNOSIS — I10 Essential (primary) hypertension: Secondary | ICD-10-CM | POA: Diagnosis not present

## 2023-07-29 MED ORDER — SODIUM CHLORIDE 0.9 % IV SOLN: 500.0000 mL | Freq: Once | INTRAVENOUS | Status: DC

## 2023-07-29 NOTE — Progress Notes (Signed)
Called to room to assist during endoscopic procedure.  Patient ID and intended procedure confirmed with present staff. Received instructions for my participation in the procedure from the performing physician.  

## 2023-07-29 NOTE — Progress Notes (Signed)
1346 Robinul 0.1 mg IV given due large amount of secretions upon assessment.  MD made aware, vss

## 2023-07-29 NOTE — Patient Instructions (Signed)
Please read handouts provided. Continue present medications. Await pathology results. Follow-up in GI clinic with Quentin Mulling as needed.   YOU HAD AN ENDOSCOPIC PROCEDURE TODAY AT THE [facility]:   Refer to the procedure report that was given to you for any specific questions about what was found during the examination.  If the procedure report does not answer your questions, please call your gastroenterologist to clarify.  If you requested that your care partner not be given the details of your procedure findings, then the procedure report has been included in a sealed envelope for you to review at your convenience later.  YOU SHOULD EXPECT: Some feelings of bloating in the abdomen. Passage of more gas than usual.  Walking can help get rid of the air that was put into your GI tract during the procedure and reduce the bloating. If you had a lower endoscopy (such as a colonoscopy or flexible sigmoidoscopy) you may notice spotting of blood in your stool or on the toilet paper. If you underwent a bowel prep for your procedure, you may not have a normal bowel movement for a few days.  Please Note:  You might notice some irritation and congestion in your nose or some drainage.  This is from the oxygen used during your procedure.  There is no need for concern and it should clear up in a day or so.  SYMPTOMS TO REPORT IMMEDIATELY:   Following upper endoscopy (EGD)  Vomiting of blood or coffee ground material  New chest pain or pain under the shoulder blades  Painful or persistently difficult swallowing  New shortness of breath  Fever of 100F or higher  Black, tarry-looking stools  For urgent or emergent issues, a gastroenterologist can be reached at any hour by calling [phone]. Do not use MyChart messaging for urgent concerns.    DIET:  We do recommend a small meal at first, but then you may proceed to your regular diet.  Drink plenty of fluids but you should avoid  alcoholic beverages for 24 hours.  ACTIVITY:  You should plan to take it easy for the rest of today and you should NOT DRIVE or use heavy machinery until tomorrow (because of the sedation medicines used during the test).    FOLLOW UP: Our staff will call the number listed on your records the next business day following your procedure.  We will call around 7:15- 8:00 am to check on you and address any questions or concerns that you may have regarding the information given to you following your procedure. If we do not reach you, we will leave a message.     If any biopsies were taken you will be contacted by phone or by letter within the next 1-3 weeks.  Please call us at [phone] if you have not heard about the biopsies in 3 weeks.    SIGNATURES/CONFIDENTIALITY: You and/or your care partner have signed paperwork which will be entered into your electronic medical record.  These signatures attest to the fact that that the information above on your After Visit Summary has been reviewed and is understood.  Full responsibility of the confidentiality of this discharge information lies with you and/or your care-partner.

## 2023-07-29 NOTE — Progress Notes (Signed)
Report given to PACU, vss 

## 2023-07-29 NOTE — Op Note (Signed)
[facility] Patient Name: Courtney Crane Procedure Date: 07/29/2023 1:36 PM MRN: [id number] Endoscopist: Doristine Locks , MD, [id number] Age: 72 Referring MD:  Date of Birth: Mar 03, 1951 Gender: Female Account #: [id number] Procedure:                Upper GI endoscopy Indications:              Generalized abdominal pain, Esophageal reflux,                            Abdominal bloating, Iron deficiency, history of                            gastritis Medicines:                Monitored Anesthesia Care Procedure:                Pre-Anesthesia Assessment:                           - Prior to the procedure, a History and Physical                            was performed, and patient medications and                            allergies were reviewed. The patient's tolerance of                            previous anesthesia was also reviewed. The risks                            and benefits of the procedure and the sedation                            options and risks were discussed with the patient.                            All questions were answered, and informed consent                            was obtained. Prior Anticoagulants: The patient has                            taken no anticoagulant or antiplatelet agents. ASA                            Grade Assessment: II - A patient with mild systemic                            disease. After reviewing the risks and benefits,                            the patient was deemed in satisfactory condition to  undergo the procedure.                           After obtaining informed consent, the endoscope was                            passed under direct vision. Throughout the                            procedure, the patient's blood pressure, pulse, and                            oxygen saturations were monitored continuously. The                            GIF [id number] #[id number] was introduced through  the                            mouth, and advanced to the third part of duodenum.                            The upper GI endoscopy was accomplished without                            difficulty. The patient tolerated the procedure                            well. Scope In: Scope Out: Findings:                 The examined esophagus was normal.                           The Z-line was regular and was found 40 cm from the                            incisors.                           Multiple small sessile, erythematous polyps with no                            bleeding were found in the cardia, in the gastric                            fundus and in the gastric body. Several of these                            polyps were removed with a cold biopsy forceps for                            histologic representative evaluation (jar 1).                            Resection and retrieval were complete. Estimated  blood loss was minimal.                           Mildly erythematous mucosa without bleeding was                            found in the gastric fundus and in the gastric                            body. Biopsies were taken with a cold forceps for                            Helicobacter pylori testing (jar 3). Estimated                            blood loss was minimal.                           The incisura, gastric antrum and pylorus were                            normal. Additional biopsies were taken with cold                            forceps for histology (jar 3).                           The examined duodenum was normal. Biopsies were                            taken with a cold forceps for histology (jar 2).                            Estimated blood loss was minimal. Complications:            No immediate complications. Estimated Blood Loss:     Estimated blood loss was minimal. Impression:               - Normal esophagus.                            - Z-line regular, 40 cm from the incisors.                           - Multiple gastric polyps. Resected and retrieved.                           - Erythematous mucosa in the gastric fundus and                            gastric body. Biopsied.                           - Normal incisura, antrum and pylorus.                           -  Normal examined duodenum. Biopsied. Recommendation:           - Patient has a contact number available for                            emergencies. The signs and symptoms of potential                            delayed complications were discussed with the                            patient. Return to normal activities tomorrow.                            Written discharge instructions were provided to the                            patient.                           - Resume previous diet.                           - Continue present medications.                           - Await pathology results.                           - Follow-up with Quentin Mulling, PA-C in the GI                            Clinic as needed. Doristine Locks, MD 07/29/2023 2:03:26 PM

## 2023-07-29 NOTE — Progress Notes (Signed)
GASTROENTEROLOGY PROCEDURE H&P NOTE   Primary Care Physician: Alysia Penna, MD    Reason for Procedure:  GERD, epigastric pain, iron deficiency anemia, history of gastritis  Plan:    EGD  Patient is appropriate for endoscopic procedure(s) in the ambulatory ([facility]) setting.  The nature of the procedure, as well as the risks, benefits, and alternatives were carefully and thoroughly reviewed with the patient. Ample time for discussion and questions allowed. The patient understood, was satisfied, and agreed to proceed.     HPI: Courtney Goleman is a 72 y.o. female who presents for EGD for evaluation of GERD, epigastric pain, IDA, history of gastritis.  Was seen in the GI clinic on 06/14/2023.  Was having worsening reflux and increased omeprazole to 40 mg twice daily.  Also with epigastric pain and IDA with ferritin 26, iron 57, iron sat 12%.  EGD in 2021 with mild nonulcer gastritis.  Past Medical History:  Diagnosis Date   Anemia    Anxiety    Arthritis    Asthma    Cataract    Colon polyps    Complication of anesthesia    "sometimes I dont like to wake up after " ie difficult to awaken    Diverticulitis    Diverticulosis    Fatty liver    GERD (gastroesophageal reflux disease)    HLD (hyperlipidemia)    HTN (hypertension)    IBS (irritable bowel syndrome)    Insomnia    MRSA infection    Osteoarthritis    Osteopenia    PONV (postoperative nausea and vomiting)    RA (rheumatoid arthritis) (HCC)    Rheumatoid arthritis (HCC)    Seasonal allergies    Shingles    Status post dilation of esophageal narrowing    Vitamin D deficiency     Past Surgical History:  Procedure Laterality Date   ADENOIDECTOMY     APPENDECTOMY  1975   bladder tack  1990   CATARACT EXTRACTION, BILATERAL Bilateral    CHOLECYSTECTOMY N/A 01/31/2017   Procedure: LAPAROSCOPIC CHOLECYSTECTOMY WITH INTRAOPERATIVE CHOLANGIOGRAM;  Surgeon: Darnell Level, MD;  Location: [facility];  Service:  General;  Laterality: N/A;   INCISION AND DRAINAGE PERIRECTAL ABSCESS N/A 12/02/2017   Procedure: IRRIGATION AND DEBRIDEMENT PERINEAL ABSCESS;  Surgeon: Gaynelle Adu, MD;  Location: [facility] OR;  Service: General;  Laterality: N/A;   LUMBAR FUSION     MENISCUS REPAIR Right 2013   REVERSE SHOULDER ARTHROPLASTY Right 12/06/2022   Procedure: RIGHT REVERSE SHOULDER ARTHROPLASTY;  Surgeon: Jones Broom, MD;  Location: [facility];  Service: Orthopedics;  Laterality: Right;   ROTATOR CUFF REPAIR Right 2005   TONSILLECTOMY     as a child   TOTAL ABDOMINAL HYSTERECTOMY  1988   TOTAL KNEE ARTHROPLASTY Left    WRIST RECONSTRUCTION Right 2008    Prior to Admission medications   Medication Sig Start Date End Date Taking? Authorizing Provider  acetaminophen (TYLENOL) 500 MG tablet Take 1,000 mg by mouth every 6 (six) hours as needed (pain.).   Yes [provider]  albuterol (VENTOLIN HFA) 108 (90 Base) MCG/ACT inhaler Inhale 2 puffs into the lungs every 6 (six) hours as needed for wheezing or shortness of breath. 07/05/23  Yes Padgett, Pilar Grammes, MD  amLODipine (NORVASC) 10 MG tablet Take 10 mg by mouth in the morning.   Yes [provider]  ascorbic acid (VITAMIN C) 100 MG tablet Take by mouth. 07/19/20  Yes [provider]  benazepril-hydrochlorthiazide (LOTENSIN HCT) 10-12.5 MG  tablet  06/06/23  Yes [provider]  bismuth subsalicylate (PEPTO BISMOL) 262 MG chewable tablet Chew 262-524 mg by mouth 3 (three) times daily as needed for diarrhea or loose stools or indigestion.   Yes [provider]  Budeson-Glycopyrrol-Formoterol (BREZTRI AEROSPHERE) 160-9-4.8 MCG/ACT AERO Inhale 2 puffs into the lungs in the morning and at bedtime. 07/05/23  Yes Padgett, Pilar Grammes, MD  Calcium Carb-Cholecalciferol (CALCIUM 600+D3 PO) Take 1 tablet by mouth in the morning.   Yes [provider]  calcium elemental as carbonate (TUMS ULTRA 1000) 400 MG chewable  tablet Chew 1,000 mg by mouth 3 (three) times daily as needed for heartburn.   Yes [provider]  CANNABIDIOL PO Take 1 capsule by mouth at bedtime. CBD   Yes [provider]  Cholecalciferol (VITAMIN D-3) 125 MCG (5000 UT) TABS Take 5,000 Units by mouth in the morning.   Yes [provider]  famotidine (PEPCID) 40 MG tablet TAKE 1 TABLET BY MOUTH EVERYDAY AT BEDTIME 10/30/22  Yes Esterwood, Amy S, PA-C  Ferrous Sulfate ([employer] SLOW RELEASE IRON PO) Take 65 mg by mouth 3 (three) times a week.   Yes [provider]  fexofenadine (ALLEGRA) 180 MG tablet Take 0.5 tablets (90 mg total) by mouth in the morning. Patient taking differently: Take 90 mg by mouth in the morning. Patient gets OTC, please do not send it to pharmacy. 07/05/23  Yes Padgett, Pilar Grammes, MD  ipratropium (ATROVENT) 0.06 % nasal spray Place 1 spray into both nostrils daily. 07/05/23  Yes Padgett, Pilar Grammes, MD  leflunomide (ARAVA) 20 MG tablet Take 20 mg by mouth every evening. 04/06/22  Yes [provider]  losartan (COZAAR) 100 MG tablet Take 100 mg by mouth daily.   Yes [provider]  Magnesium 250 MG TABS Take 250 mg by mouth every evening.   Yes [provider]  MELATONIN PO Take 1.5 mg by mouth at bedtime as needed (sleep).   Yes [provider]  mirabegron ER (MYRBETRIQ) 25 MG TB24 tablet Take 1 tablet (25 mg total) by mouth daily. 02/21/23  Yes Selmer Dominion, NP  Multiple Vitamins-Minerals (IMMUNE SUPPORT VITAMIN C PO) Take 1 tablet by mouth in the morning. With Vitamin D 3/Vitamin A/Magnesium   Yes [provider]  Olopatadine HCl (PATADAY) 0.2 % SOLN Place 1 drop into both eyes daily as needed. Patient taking differently: Place 1 drop into both eyes daily as needed. Patient gets OTC, please do not send it to pharmacy. 07/05/23  Yes Padgett, Pilar Grammes, MD  Omega-3 Fatty Acids (FISH OIL) 1200 MG CAPS Take 1,200 mg by mouth 2  (two) times daily.   Yes [provider]  omeprazole (PRILOSEC) 40 MG capsule Take 1 capsule (40 mg total) by mouth 2 (two) times daily before a meal. 06/14/23  Yes Quentin Mulling R, PA-C  rosuvastatin (CRESTOR) 5 MG tablet Take 5 mg by mouth every evening. 08/15/21  Yes [provider]  simethicone (MYLICON) 125 MG chewable tablet Chew 125 mg by mouth every 6 (six) hours as needed for flatulence.   Yes [provider]  sodium chloride (OCEAN) 0.65 % SOLN nasal spray Place 1 spray into both nostrils in the morning and at bedtime.   Yes [provider]  temazepam (RESTORIL) 15 MG capsule Take 15 mg by mouth at bedtime as needed for sleep. 05/24/23  Yes [provider]  Dupilumab (DUPIXENT) 300 MG/2ML SOPN Inject 300 mg into the skin  every 14 (fourteen) days. 01/07/23   Josephine Igo, DO  Ferrous Gluconate (IRON) 240 (27 Fe) MG TABS  06/05/23   [provider]  polyethylene glycol (MIRALAX / GLYCOLAX) 17 g packet Take 17 g by mouth every Monday, Wednesday, and Friday. In the morning    [provider]  Psyllium (METAMUCIL 4 IN 1 FIBER PO) Take 2 capsules by mouth in the morning. Metamucil 3-in-1 Psyllium Fiber Supplement Capsules    [provider]  valACYclovir (VALTREX) 500 MG tablet Take 500 mg by mouth daily as needed (outbreaks).    [provider]    Current Outpatient Medications  Medication Sig Dispense Refill   acetaminophen (TYLENOL) 500 MG tablet Take 1,000 mg by mouth every 6 (six) hours as needed (pain.).     albuterol (VENTOLIN HFA) 108 (90 Base) MCG/ACT inhaler Inhale 2 puffs into the lungs every 6 (six) hours as needed for wheezing or shortness of breath. 18 g 1   amLODipine (NORVASC) 10 MG tablet Take 10 mg by mouth in the morning.     ascorbic acid (VITAMIN C) 100 MG tablet Take by mouth.     benazepril-hydrochlorthiazide (LOTENSIN HCT) 10-12.5 MG tablet      bismuth subsalicylate (PEPTO BISMOL) 262 MG  chewable tablet Chew 262-524 mg by mouth 3 (three) times daily as needed for diarrhea or loose stools or indigestion.     Budeson-Glycopyrrol-Formoterol (BREZTRI AEROSPHERE) 160-9-4.8 MCG/ACT AERO Inhale 2 puffs into the lungs in the morning and at bedtime. 10.7 g 5   Calcium Carb-Cholecalciferol (CALCIUM 600+D3 PO) Take 1 tablet by mouth in the morning.     calcium elemental as carbonate (TUMS ULTRA 1000) 400 MG chewable tablet Chew 1,000 mg by mouth 3 (three) times daily as needed for heartburn.     CANNABIDIOL PO Take 1 capsule by mouth at bedtime. CBD     Cholecalciferol (VITAMIN D-3) 125 MCG (5000 UT) TABS Take 5,000 Units by mouth in the morning.     famotidine (PEPCID) 40 MG tablet TAKE 1 TABLET BY MOUTH EVERYDAY AT BEDTIME 90 tablet 3   Ferrous Sulfate (CVS SLOW RELEASE IRON PO) Take 65 mg by mouth 3 (three) times a week.     fexofenadine (ALLEGRA) 180 MG tablet Take 0.5 tablets (90 mg total) by mouth in the morning. (Patient taking differently: Take 90 mg by mouth in the morning. Patient gets OTC, please do not send it to pharmacy.) 30 tablet 5   ipratropium (ATROVENT) 0.06 % nasal spray Place 1 spray into both nostrils daily. 15 mL 5   leflunomide (ARAVA) 20 MG tablet Take 20 mg by mouth every evening.     losartan (COZAAR) 100 MG tablet Take 100 mg by mouth daily.     Magnesium 250 MG TABS Take 250 mg by mouth every evening.     MELATONIN PO Take 1.5 mg by mouth at bedtime as needed (sleep).     mirabegron ER (MYRBETRIQ) 25 MG TB24 tablet Take 1 tablet (25 mg total) by mouth daily. 90 tablet 3   Multiple Vitamins-Minerals (IMMUNE SUPPORT VITAMIN C PO) Take 1 tablet by mouth in the morning. With Vitamin D 3/Vitamin A/Magnesium     Olopatadine HCl (PATADAY) 0.2 % SOLN Place 1 drop into both eyes daily as needed. (Patient taking differently: Place 1 drop into both eyes daily as needed. Patient gets OTC, please do not send it to pharmacy.) 2.5 mL 5   Omega-3 Fatty Acids (FISH OIL) 1200 MG  CAPS Take 1,200 mg by mouth 2 (two) times daily.     omeprazole (PRILOSEC) 40 MG capsule Take 1 capsule (40 mg total) by mouth 2 (two) times daily before a meal. 180 capsule 0   rosuvastatin (CRESTOR) 5 MG tablet Take 5 mg by mouth every evening.     simethicone (MYLICON) 125 MG chewable tablet Chew 125 mg by mouth every 6 (six) hours as needed for flatulence.     sodium chloride (OCEAN) 0.65 % SOLN nasal spray Place 1 spray into both nostrils in the morning and at bedtime.     temazepam (RESTORIL) 15 MG capsule Take 15 mg by mouth at bedtime as needed for sleep.     Dupilumab (DUPIXENT) 300 MG/2ML SOPN Inject 300 mg into the skin every 14 (fourteen) days. 12 mL 2   Ferrous Gluconate (IRON) 240 (27 Fe) MG TABS  (Patient not taking: Reported on 07/29/2023)     polyethylene glycol (MIRALAX / GLYCOLAX) 17 g packet Take 17 g by mouth every Monday, Wednesday, and Friday. In the morning     Psyllium (METAMUCIL 4 IN 1 FIBER PO) Take 2 capsules by mouth in the morning. Metamucil 3-in-1 Psyllium Fiber Supplement Capsules     valACYclovir (VALTREX) 500 MG tablet Take 500 mg by mouth daily as needed (outbreaks).     Current Facility-Administered Medications  Medication Dose Route Frequency Provider Last Rate Last Admin   0.9 %  sodium chloride infusion  500 mL Intravenous Once Gentle Hoge V, DO        Allergies as of 07/29/2023 - Review Complete 07/29/2023  Allergen Reaction Noted   Meperidine Nausea Only 10/08/2014   Methotrexate Other (See Comments) 10/18/2021   Nickel Rash 01/11/2018   Tape Rash and Other (See Comments) 10/18/2021    Family History  Problem Relation Age of Onset   Colon polyps Mother    Diabetes Mother    Heart disease Mother    Kidney cancer Mother    Arthritis Mother    Hypertension Mother    Hyperlipidemia Mother    Dementia Father    CAD Brother    Esophageal cancer Brother    Colon polyps Son    Colon cancer Neg Hx    Stomach cancer Neg Hx    Rectal cancer Neg  Hx    Prostate cancer Neg Hx    Pancreatic cancer Neg Hx     Social History   Socioeconomic History   Marital status: Married    Spouse name: Not on file   Number of children: 2   Years of education: Not on file   Highest education level: Not on file  Occupational History   Occupation: retired  Tobacco Use   Smoking status: Never    Passive exposure: Never   Smokeless tobacco: Never  Vaping Use   Vaping status: Never Used  Substance and Sexual Activity   Alcohol use: Yes    Alcohol/week: 7.0 standard drinks of alcohol    Types: 7 Standard drinks or equivalent per week    Comment: wine daily   Drug use: No    Comment: takes CBD oil   Sexual activity: Not Currently    Partners: Male    Birth control/protection: Post-menopausal    Comment: Hx HSV, High RISK Medicare  Other Topics Concern   Not on file  Social History Narrative   Retired from [employer]   Lives at home with husband ; one level home   Right handed  Highest level of education:  Some college   Social Determinants of Health   Financial Resource Strain: Not on file  Food Insecurity: Not on file  Transportation Needs: Not on file  Physical Activity: Not on file  Stress: Not on file  Social Connections: Not on file  Intimate Partner Violence: Not on file    Physical Exam: Vital signs in last 24 hours: @BP  (!) 144/74   Pulse 85   Temp 98 F (36.7 C)   Ht 5\' 2"  (1.575 m)   Wt 133 lb (60.3 kg)   SpO2 98%   BMI 24.33 kg/m  GEN: NAD EYE: Sclerae anicteric ENT: MMM CV: Non-tachycardic Pulm: CTA b/l GI: Soft, NT/ND NEURO:  Alert & Oriented x 3   Doristine Locks, DO [facility]   07/29/2023 1:17 PM

## 2023-07-30 ENCOUNTER — Telehealth: Payer: Self-pay

## 2023-07-30 DIAGNOSIS — T183XXA Foreign body in small intestine, initial encounter: Secondary | ICD-10-CM

## 2023-07-30 DIAGNOSIS — D509 Iron deficiency anemia, unspecified: Secondary | ICD-10-CM

## 2023-07-30 NOTE — Telephone Encounter (Signed)
  Follow up Call-     07/29/2023    1:07 PM  Call back number  Post procedure Call Back phone  # [phone]  Permission to leave phone message Yes     Left message

## 2023-07-31 ENCOUNTER — Other Ambulatory Visit: Payer: Self-pay

## 2023-07-31 ENCOUNTER — Telehealth: Payer: Self-pay | Admitting: Gastroenterology

## 2023-07-31 MED ORDER — RIFAXIMIN 550 MG PO TABS: 550.0000 mg | ORAL_TABLET | Freq: Three times a day (TID) | ORAL | 0 refills | Status: DC

## 2023-07-31 NOTE — Telephone Encounter (Signed)
Inbound call from patient stating that she needs a prior auth for xifaxan. Patient stated that she was provided with a request number from her insurance and they have sent over a request as well. Request number [id number].  Please advise.

## 2023-08-01 LAB — SURGICAL PATHOLOGY

## 2023-08-02 ENCOUNTER — Ambulatory Visit: Payer: PPO | Admitting: Obstetrics and Gynecology

## 2023-08-02 ENCOUNTER — Other Ambulatory Visit: Payer: PPO | Admitting: Obstetrics and Gynecology

## 2023-08-02 ENCOUNTER — Encounter: Payer: Self-pay | Admitting: Obstetrics and Gynecology

## 2023-08-02 VITALS — BP 96/57 | HR 73

## 2023-08-02 DIAGNOSIS — N393 Stress incontinence (female) (male): Secondary | ICD-10-CM

## 2023-08-02 DIAGNOSIS — N812 Incomplete uterovaginal prolapse: Secondary | ICD-10-CM

## 2023-08-02 DIAGNOSIS — N3281 Overactive bladder: Secondary | ICD-10-CM

## 2023-08-02 MED ORDER — MIRABEGRON ER 25 MG PO TB24
25.0000 mg | ORAL_TABLET | Freq: Every day | ORAL | 3 refills | Status: DC
Start: 2023-08-02 — End: 2024-08-31

## 2023-08-02 NOTE — Progress Notes (Signed)
[facility]   Subjective:     Chief Complaint:  Chief Complaint  Patient presents with   Annual follow up    Courtney Crane is a 72 y.o. female is here for Annual check up.   History of Present Illness: Courtney Crane is a 72 y.o. female with stage II posterior POP, stress incontinence and OAB who presents for a pessary check. She is using a size #0 incontinence dish pessary. She is very happy with the pessary. She cleans it about every week. Denies vaginal bleeding.   She is on the 25mg  Myrbetriq and has been working well for her urgency. However the medication is expensive (abt $100/ month)  Past Medical History: Patient  has a past medical history of Allergy, Anemia, Anxiety, Arthritis, Asthma, Cataract, Colon polyps, Complication of anesthesia, Diverticulitis, Diverticulosis, Fatty liver, GERD (gastroesophageal reflux disease), HLD (hyperlipidemia), HTN (hypertension), IBS (irritable bowel syndrome), Insomnia, MRSA infection, Osteoarthritis, Osteopenia, PONV (postoperative nausea and vomiting), RA (rheumatoid arthritis) (HCC), Rheumatoid arthritis (HCC), Seasonal allergies, Shingles, Status post dilation of esophageal narrowing, and Vitamin D deficiency.   Past Surgical History: She  has a past surgical history that includes Appendectomy (1975); Total abdominal hysterectomy (1988); Tonsillectomy; bladder tack (1990); Rotator cuff repair (Right, 2005); Wrist reconstruction (Right, 2008); Meniscus repair (Right, 2013); Total knee arthroplasty (Left); Cholecystectomy (N/A, 01/31/2017); Incision and drainage perirectal abscess (N/A, 12/02/2017); Cataract extraction, bilateral (Bilateral); Adenoidectomy; Lumbar fusion; and Reverse shoulder arthroplasty (Right, 12/06/2022).   Medications: She has a current medication list which includes the following prescription(s): acetaminophen, albuterol, amlodipine, ascorbic acid, benazepril-hydrochlorthiazide, bismuth subsalicylate,  breztri aerosphere, calcium carb-cholecalciferol, tums ultra 1000, cannabidiol, vitamin d-3, dupixent, famotidine, iron, ferrous sulfate, fexofenadine, ipratropium, leflunomide, losartan, magnesium, melatonin, multiple vitamins-minerals, olopatadine hcl, fish oil, omeprazole, polyethylene glycol, psyllium, rifaximin, rosuvastatin, simethicone, sodium chloride, temazepam, valacyclovir, and mirabegron er.   Allergies: Patient is allergic to meperidine, methotrexate, nickel, and tape.   Social History: Patient  reports that she has never smoked. She has never been exposed to tobacco smoke. She has never used smokeless tobacco. She reports current alcohol use of about 7.0 standard drinks of alcohol per week. She reports that she does not use drugs.      Objective:    Physical Exam: BP (!) 96/57   Pulse 73  Gen: No apparent distress, A&O x 3. Detailed Urogynecologic Evaluation:  Pelvic Exam: Normal external female genitalia; Bartholin's and Skene's glands normal in appearance; urethral meatus normal in appearance, no urethral masses or discharge. The pessary was noted to be in place. It was removed and cleaned. Speculum exam revealed no lesions in the vagina, atrophy noted. The pessary was replaced. It was comfortable to the patient and fit well.   POP-Q:    POP-Q   -3                                            Aa   -3                                           Ba   -5.5  C    3.5                                            Gh   5                                            Pb   7                                            tvl    0                                            Ap   0                                            Bp                                                  D         Assessment/Plan:    Assessment: Courtney Crane is a 72 y.o. with stage II pelvic organ prolapse, stress incontinence, and OAB here for a pessary check.  She is doing well.  Plan: - Continue #0 incontinence dish pessary. Remove weekly or as needed.  - Continue Myrbetriq 25mg . Refills sent. We discussed alternative medication since it is expensive but she does not want to try anything different. She wants to try to use it less often to see if it is really needed (will try every other day to start).  - Declined vaginal estrogen, can use coconut oil for moisture if needed  Follow up 1 year or sooner if needed  Marguerita Beards, MD   Time spent: I spent 20 minutes dedicated to the care of this patient on the date of this encounter to include pre-visit review of records, face-to-face time with the patient and post visit documentation.

## 2023-08-05 NOTE — Telephone Encounter (Signed)
Called patients insurance and re-did the PA over the phone. Waiting on determination. I faxed patients most recent chart notes as well.  Request #: [id number]

## 2023-08-07 ENCOUNTER — Other Ambulatory Visit (HOSPITAL_COMMUNITY): Payer: Self-pay

## 2023-08-08 DIAGNOSIS — M0579 Rheumatoid arthritis with rheumatoid factor of multiple sites without organ or systems involvement: Secondary | ICD-10-CM | POA: Diagnosis not present

## 2023-08-19 DIAGNOSIS — R140 Abdominal distension (gaseous): Secondary | ICD-10-CM | POA: Diagnosis not present

## 2023-08-19 DIAGNOSIS — R1010 Upper abdominal pain, unspecified: Secondary | ICD-10-CM | POA: Diagnosis not present

## 2023-08-20 DIAGNOSIS — M1811 Unilateral primary osteoarthritis of first carpometacarpal joint, right hand: Secondary | ICD-10-CM | POA: Diagnosis not present

## 2023-08-20 DIAGNOSIS — M19031 Primary osteoarthritis, right wrist: Secondary | ICD-10-CM | POA: Diagnosis not present

## 2023-08-22 ENCOUNTER — Ambulatory Visit (INDEPENDENT_AMBULATORY_CARE_PROVIDER_SITE_OTHER): Payer: PPO | Admitting: Gastroenterology

## 2023-08-22 DIAGNOSIS — D509 Iron deficiency anemia, unspecified: Secondary | ICD-10-CM | POA: Diagnosis not present

## 2023-08-22 NOTE — Patient Instructions (Signed)
You may have clear liquids beginning at 10:30 am after ingesting the capsule.    You can have a light lunch at 12:30 pm; sandwich and half bowl of soup.  Return to the office at 4 pm to return the equipment.   Return to you normal diet at 5 pm.   Call [phone] and ask for Cinderella Christoffersen, RN if you have any questions.  You should pass the capsule in your stool 8-48 hours after ingestion. If you have not passed the capsule, after 72 hours, please contact the office at [phone].    

## 2023-08-22 NOTE — Progress Notes (Signed)
SN: [id number] Exp: 2024-01-03 LOT: [id number]  Patient arrived for VCE. Reported the prep went well. This RN explained capsule dietary restrictions for the next few hours. Pt advised to return at 4 pm to return capsule equipment.  Patient verbalized understanding. Opened capsule, ensured capsule was flashing prior to the patient swallowing the capsule. Patient swallowed capsule without difficulty.  Patient told to call the office with any questions and if capsule has not passed after 72 hours. No further questions by the conclusion of the visit.

## 2023-08-23 ENCOUNTER — Ambulatory Visit: Payer: PPO | Admitting: Pulmonary Disease

## 2023-08-25 ENCOUNTER — Other Ambulatory Visit: Payer: Self-pay | Admitting: Physician Assistant

## 2023-08-27 NOTE — Addendum Note (Signed)
Addended by: Quentin Mulling on: 08/27/2023 01:38 PM   Modules accepted: Orders

## 2023-08-27 NOTE — Telephone Encounter (Signed)
Patient had capsule endoscopy, states has not seen capsule yet.  Any symptoms of obstruction such as nausea, vomiting, abdominal pain?  If so please go to the ER. We'll get KUB to evaluate if capsule is still there.  Sent my chart message Orders are in.

## 2023-08-28 ENCOUNTER — Telehealth: Payer: Self-pay | Admitting: Gastroenterology

## 2023-08-28 ENCOUNTER — Ambulatory Visit (INDEPENDENT_AMBULATORY_CARE_PROVIDER_SITE_OTHER)
Admission: RE | Admit: 2023-08-28 | Discharge: 2023-08-28 | Disposition: A | Discharge status: Home / Self Care | Payer: PPO | Source: Ambulatory Visit | Attending: Physician Assistant | Admitting: Physician Assistant

## 2023-08-28 DIAGNOSIS — R935 Abnormal findings on diagnostic imaging of other abdominal regions, including retroperitoneum: Secondary | ICD-10-CM | POA: Diagnosis not present

## 2023-08-28 DIAGNOSIS — Z981 Arthrodesis status: Secondary | ICD-10-CM | POA: Diagnosis not present

## 2023-08-28 DIAGNOSIS — T183XXA Foreign body in small intestine, initial encounter: Secondary | ICD-10-CM | POA: Diagnosis not present

## 2023-08-28 NOTE — Telephone Encounter (Signed)
Left message for patient to call back  

## 2023-08-28 NOTE — Telephone Encounter (Signed)
Patient is advised of slightly elevated SIBO testing. She was unable to get Xifaxan that was recently prescribed because it was denied by her insurance.   Please advise.Marland KitchenMarland Kitchen

## 2023-08-28 NOTE — Telephone Encounter (Signed)
Received results from the SIBO breath testing by [facility] conducted on 08/19/2023.  Normal hydrogen at 12, normal methane at 5, but combined hydrogen/methane slightly above normal at 17 (normal is < 15 ppm).  She was previously prescribed Xifaxan last month.  Did she ever get this prescription, and if so, did she take this?

## 2023-08-29 NOTE — Telephone Encounter (Signed)
Inbound call from patient stating her insurance denied Xifaxan again. Patient requesting for an alternative to be called in. Patient requesting a call. Please advise, thank you.

## 2023-08-29 NOTE — Telephone Encounter (Signed)
Left message advising patient that treatment is not currently needed since the elevation of hydrogen/methane was so slight. Advised to call back with any additional questions.

## 2023-08-30 MED ORDER — DIPHENOXYLATE-ATROPINE 2.5-0.025 MG PO TABS: 1.0000 | ORAL_TABLET | Freq: Four times a day (QID) | ORAL | 0 refills | Status: DC | PRN

## 2023-08-30 NOTE — Telephone Encounter (Signed)
Patient had previously normal CT, EGD/colon Still pending capsule endoscopy results. With abdominal bloating and discomfort send in Lomotil for patient to take in the meantime [facility]. Continue FODMAP diet previously discussed, avoiding potential food triggers, can do IBgard as needed.  Advised to go to the ER if there is any severe abdominal pain, unable to hold down food/water, blood in stool or vomit, chest pain, shortness of breath, or any worsening symptoms.

## 2023-08-30 NOTE — Telephone Encounter (Signed)
See 07/30/23 patient message for additional correspondence with patient/Amanda Steffanie Dunn, PA-C.

## 2023-08-30 NOTE — Addendum Note (Signed)
Addended by: Quentin Mulling on: 08/30/2023 12:52 PM   Modules accepted: Orders

## 2023-08-30 NOTE — Telephone Encounter (Addendum)
Inbound call from patient, requesting to speak to [facility] in regards to [place] medication. States if she misses the call she will call back tomorrow.

## 2023-09-02 NOTE — Telephone Encounter (Signed)
See 07/30/23 patient message for additional information.

## 2023-09-10 NOTE — Progress Notes (Signed)
Video Capsule Endoscopy results were reviewed and notable for the following:  Findings: 1) Complete capsule endoscopy with adequate prep 2) Gastric erythema without bleeding 3) otherwise normal-appearing small bowel without any areas of active bleeding or stigmata of recent bleeding  Summary and Recommendations: Complete capsule endoscopy without any evidence of active bleeding or stigmata of recent bleeding.  - Repeat CBC and iron panel - If ongoing iron deficiency, plan for colonoscopy   Doristine Locks, DO, [facility]

## 2023-09-11 ENCOUNTER — Telehealth: Payer: Self-pay

## 2023-09-11 DIAGNOSIS — R1010 Upper abdominal pain, unspecified: Secondary | ICD-10-CM

## 2023-09-11 DIAGNOSIS — R140 Abdominal distension (gaseous): Secondary | ICD-10-CM

## 2023-09-11 DIAGNOSIS — D509 Iron deficiency anemia, unspecified: Secondary | ICD-10-CM

## 2023-09-11 DIAGNOSIS — R197 Diarrhea, unspecified: Secondary | ICD-10-CM

## 2023-09-11 NOTE — Telephone Encounter (Addendum)
Left message for patient to return call to further discuss results.  Will continue efforts.   Cirigliano, Verlin Dike, DO  Lamona Curl, CMA Can you please contact this patient to let her know that I reviewed the video Capsule Endoscopy results which were notable for the following:  Video Capsule Endoscopy results were reviewed and notable for the following:  Findings: 1) Complete capsule endoscopy with adequate prep 2) Gastric erythema without bleeding 3) otherwise normal-appearing small bowel without any areas of active bleeding or stigmata of recent bleeding  Summary and Recommendations: Complete capsule endoscopy without any evidence of active bleeding or stigmata of recent bleeding.  - Repeat CBC and iron panel - If ongoing iron deficiency, plan for colonoscopy

## 2023-09-12 DIAGNOSIS — M0579 Rheumatoid arthritis with rheumatoid factor of multiple sites without organ or systems involvement: Secondary | ICD-10-CM | POA: Diagnosis not present

## 2023-09-12 NOTE — Telephone Encounter (Signed)
PT is calling to find out if Dr. Barron Alvine wants her to continue the fodmap diet. Please leave a VM on her house number to confirm

## 2023-09-12 NOTE — Telephone Encounter (Signed)
Patient notified of the results of capsule endoscopy per Dr Barron Alvine.  Patient advised, "Complete capsule endoscopy without any evidence of active bleeding or stigmata of recent bleeding.  - Repeat CBC and iron panel - If ongoing iron deficiency, plan for colonoscopy."  Patient agreed to plan and verbalized understanding.   Patient continues to have complaints of ongoing abd pain, with gas and bloating, and diarrhea after she consumes food.  She is states that after completing these tests, she still has no answers as to why she is having these symptoms.  Patient was scheduled to follow up to further discuss these issues with Doug Sou, PA-C on 09-19-23.   Patient would like to know if you have any further recommendations between now and the appointment with Shanda Bumps next week.   Please advise.

## 2023-09-12 NOTE — Telephone Encounter (Signed)
PA has been submitted and was not needed. Left msg for patient for additional information as the pharmacy is closed for lunch.

## 2023-09-12 NOTE — Telephone Encounter (Signed)
Left message on voicemail per request that patient should continue LOWFOD map diet.

## 2023-09-12 NOTE — Telephone Encounter (Signed)
Patient notified of Dr Frankey Shown recommendations as follows:  "We had previously discussed a trial of rifaximin for diagnostic and therapeutic intent, but this was denied by insurance. We sent for SIBO/IMO testing which was essentially normal (Normal hydrogen at 12, normal methane at 5, but combined hydrogen/methane slightly above normal at 17).   Have previously recommended courses of OTC Fodzyme, IBGard, and low FODMAP diet.  Was also put in for Rx for Lomotil.  Will want to review which of these agents she tried and whether or not any were efficacious at her follow-up appointment.   Plan for the following:  - Check fecal pancreatic elastase  - Depending on appointment with Shanda Bumps next week, may want to resubmit Rx for rifaximin with indication of IBS-D (not SIBO)."  Patient advised that when she comes for lab work she should pick up stool kit to check fecal pancreatic elastase.  Patient realizes that the results of this test may not be in before her appointment next week with Shanda Bumps.  Patient agreed to plan and verbalized understanding.

## 2023-09-13 ENCOUNTER — Other Ambulatory Visit (INDEPENDENT_AMBULATORY_CARE_PROVIDER_SITE_OTHER): Payer: PPO

## 2023-09-13 DIAGNOSIS — D509 Iron deficiency anemia, unspecified: Secondary | ICD-10-CM | POA: Diagnosis not present

## 2023-09-13 LAB — IBC + FERRITIN
Ferritin: 49.7 ng/mL (ref 10.0–291.0)
Iron: 34 ug/dL — ABNORMAL LOW (ref 42–145)
Saturation Ratios: 7.2 % — ABNORMAL LOW (ref 20.0–50.0)
TIBC: 473.2 ug/dL — ABNORMAL HIGH (ref 250.0–450.0)
Transferrin: 338 mg/dL (ref 212.0–360.0)

## 2023-09-13 LAB — CBC
HCT: 38.8 % (ref 36.0–46.0)
Hemoglobin: 12.4 g/dL (ref 12.0–15.0)
MCHC: 32.1 g/dL (ref 30.0–36.0)
MCV: 95.2 fL (ref 78.0–100.0)
Platelets: 180 10*3/uL (ref 150.0–400.0)
RBC: 4.07 Mil/uL (ref 3.87–5.11)
RDW: 15.4 % (ref 11.5–15.5)
WBC: 4.4 10*3/uL (ref 4.0–10.5)

## 2023-09-13 NOTE — Telephone Encounter (Signed)
Patient has already picked up the medication for less than $5. Per pharmacy she doesn't see this request and to please disregard.

## 2023-09-16 ENCOUNTER — Telehealth: Payer: Self-pay | Admitting: Pharmacist

## 2023-09-16 ENCOUNTER — Encounter: Payer: Self-pay | Admitting: Pulmonary Disease

## 2023-09-16 ENCOUNTER — Other Ambulatory Visit: Payer: Self-pay

## 2023-09-16 ENCOUNTER — Other Ambulatory Visit: Payer: PPO

## 2023-09-16 DIAGNOSIS — R1010 Upper abdominal pain, unspecified: Secondary | ICD-10-CM

## 2023-09-16 DIAGNOSIS — R140 Abdominal distension (gaseous): Secondary | ICD-10-CM

## 2023-09-16 DIAGNOSIS — R197 Diarrhea, unspecified: Secondary | ICD-10-CM | POA: Diagnosis not present

## 2023-09-16 DIAGNOSIS — D509 Iron deficiency anemia, unspecified: Secondary | ICD-10-CM

## 2023-09-16 DIAGNOSIS — J4551 Severe persistent asthma with (acute) exacerbation: Secondary | ICD-10-CM

## 2023-09-16 MED ORDER — DUPIXENT 300 MG/2ML ~~LOC~~ SOAJ
300.0000 mg | SUBCUTANEOUS | 0 refills | Status: DC
Start: 2023-09-16 — End: 2023-12-06

## 2023-09-16 MED ORDER — NA SULFATE-K SULFATE-MG SULF 17.5-3.13-1.6 GM/177ML PO SOLN: 1.0000 | ORAL | 0 refills | Status: DC

## 2023-09-16 NOTE — Telephone Encounter (Signed)
Dr. Tonia Brooms does not have any openings before 12/04/2023. The NP's are scheduling the end of December 2024.

## 2023-09-16 NOTE — Telephone Encounter (Signed)
Refill for Dupixent sent to [facility] x 3 months  Patient's appointment is all the way out in Jan 2025.  Routing to scheduling team to see if any sooner availability  Chesley Mires, PharmD, MPH, BCPS, CPP Clinical Pharmacist (Rheumatology and Pulmonology)

## 2023-09-17 ENCOUNTER — Telehealth: Payer: Self-pay | Admitting: Pharmacy Technician

## 2023-09-17 ENCOUNTER — Encounter: Payer: Self-pay | Admitting: Gastroenterology

## 2023-09-17 NOTE — Telephone Encounter (Signed)
Pharmacy Patient Advocate Encounter   Received notification from CoverMyMeds that prior authorization for XIFAXAN 550MG  is required/requested.   Insurance verification completed.   The patient is insured through [facility] ADVANTAGE/RX ADVANCE .   Per test claim: PA required; PA submitted to [facility] ADVANTAGE/RX ADVANCE via CoverMyMeds Key/confirmation #/[facility] Status is pending

## 2023-09-19 ENCOUNTER — Encounter: Payer: Self-pay | Admitting: Gastroenterology

## 2023-09-19 ENCOUNTER — Ambulatory Visit: Payer: PPO | Admitting: Gastroenterology

## 2023-09-19 VITALS — BP 134/50 | HR 84 | Ht 62.0 in | Wt 130.0 lb

## 2023-09-19 DIAGNOSIS — R140 Abdominal distension (gaseous): Secondary | ICD-10-CM | POA: Diagnosis not present

## 2023-09-19 DIAGNOSIS — K529 Noninfective gastroenteritis and colitis, unspecified: Secondary | ICD-10-CM

## 2023-09-19 DIAGNOSIS — R143 Flatulence: Secondary | ICD-10-CM

## 2023-09-19 HISTORY — DX: Noninfective gastroenteritis and colitis, unspecified: K52.9

## 2023-09-19 HISTORY — DX: Flatulence: R14.3

## 2023-09-19 HISTORY — DX: Abdominal distension (gaseous): R14.0

## 2023-09-19 NOTE — Patient Instructions (Signed)
You have been scheduled for a colonoscopy. Please follow written instructions given to you at your visit today.   Please pick up your prep supplies at the pharmacy within the next 1-3 days.  If you use inhalers (even only as needed), please bring them with you on the day of your procedure.  DO NOT TAKE 7 DAYS PRIOR TO TEST- Trulicity (dulaglutide) Ozempic, Wegovy (semaglutide) Mounjaro (tirzepatide) Bydureon Bcise (exanatide extended release)  DO NOT TAKE 1 DAY PRIOR TO YOUR TEST Rybelsus (semaglutide) Adlyxin (lixisenatide) Victoza (liraglutide) Byetta (exanatide) ______________________________________________________________________  _______________________________________________________  If your blood pressure at your visit was 140/90 or greater, please contact your primary care physician to follow up on this.  _______________________________________________________  If you are age 56 or older, your body mass index should be between 23-30. Your Body mass index is 23.78 kg/m. If this is out of the aforementioned range listed, please consider follow up with your Primary Care Provider.  If you are age 51 or younger, your body mass index should be between 19-25. Your Body mass index is 23.78 kg/m. If this is out of the aformentioned range listed, please consider follow up with your Primary Care Provider.   ________________________________________________________  The [facility] providers would like to encourage you to use [facility] to communicate with providers for non-urgent requests or questions.  Due to long hold times on the telephone, sending your provider a message by [facility] may be a faster and more efficient way to get a response.  Please allow 48 business hours for a response.  Please remember that this is for non-urgent requests.  _______________________________________________________

## 2023-09-19 NOTE — Progress Notes (Signed)
09/19/2023 Courtney Crane [id number] [birth year]   HISTORY OF PRESENT ILLNESS:  This is a 72 year old female who is a patient of Dr. Frankey Shown.  She has issues with chronic diarrhea with gas and bloating.  She has undergone extensive evaluation since July including EGD, capsule endoscopy, CT scan, abdominal ultrasound, etc.  She actually just submitted stool for fecal elastase and is scheduled for colonoscopy next week.  If all these are normal consider IBS.  She also does not have a gallbladder so could try Questran as this may bile salt related.  IBgard and Lomotil do help her symptoms.  Lomotil helps with cramping but says that she still has diarrhea when she takes it.  She is trying to work with a FODMAP diet as well.  Had somewhat equivocal SIBO breath test, but Xifaxan not covered for diagnosis of SIBO.  Past Medical History:  Diagnosis Date   Allergy    Anemia    Anxiety    Arthritis    Asthma    Cataract    Colon polyps    Complication of anesthesia    "sometimes I dont like to wake up after " ie difficult to awaken    Diverticulitis    Diverticulosis    Fatty liver    GERD (gastroesophageal reflux disease)    HLD (hyperlipidemia)    HTN (hypertension)    IBS (irritable bowel syndrome)    Insomnia    MRSA infection    Osteoarthritis    Osteopenia    PONV (postoperative nausea and vomiting)    RA (rheumatoid arthritis) (HCC)    Rheumatoid arthritis (HCC)    Seasonal allergies    Shingles    Status post dilation of esophageal narrowing    Vitamin D deficiency    Past Surgical History:  Procedure Laterality Date   ADENOIDECTOMY     APPENDECTOMY  1975   bladder tack  1990   CATARACT EXTRACTION, BILATERAL Bilateral    CHOLECYSTECTOMY N/A 01/31/2017   Procedure: LAPAROSCOPIC CHOLECYSTECTOMY WITH INTRAOPERATIVE CHOLANGIOGRAM;  Surgeon: Darnell Level, MD;  Location: [facility];  Service: General;  Laterality: N/A;   INCISION AND DRAINAGE PERIRECTAL ABSCESS N/A  12/02/2017   Procedure: IRRIGATION AND DEBRIDEMENT PERINEAL ABSCESS;  Surgeon: Gaynelle Adu, MD;  Location: [facility];  Service: General;  Laterality: N/A;   LUMBAR FUSION     MENISCUS REPAIR Right 2013   REVERSE SHOULDER ARTHROPLASTY Right 12/06/2022   Procedure: RIGHT REVERSE SHOULDER ARTHROPLASTY;  Surgeon: Jones Broom, MD;  Location: [facility];  Service: Orthopedics;  Laterality: Right;   ROTATOR CUFF REPAIR Right 2005   TONSILLECTOMY     as a child   TOTAL ABDOMINAL HYSTERECTOMY  1988   TOTAL KNEE ARTHROPLASTY Left    WRIST RECONSTRUCTION Right 2008    reports that she has never smoked. She has never been exposed to tobacco smoke. She has never used smokeless tobacco. She reports current alcohol use of about 7.0 standard drinks of alcohol per week. She reports that she does not use drugs. family history includes Arthritis in her mother; CAD in her brother; Colon polyps in her mother and son; Dementia in her father; Diabetes in her mother; Esophageal cancer in her brother; Heart disease in her mother; Hyperlipidemia in her mother; Hypertension in her mother; Kidney cancer in her mother. Allergies  Allergen Reactions   Meperidine Nausea Only   Methotrexate Other (See Comments)    Unknown reaction   Nickel Rash   Tape Rash  and Other (See Comments)      Outpatient Encounter Medications as of 09/19/2023  Medication Sig   acetaminophen (TYLENOL) 500 MG tablet Take 1,000 mg by mouth every 6 (six) hours as needed (pain.).   albuterol (VENTOLIN HFA) 108 (90 Base) MCG/ACT inhaler Inhale 2 puffs into the lungs every 6 (six) hours as needed for wheezing or shortness of breath.   amLODipine (NORVASC) 10 MG tablet Take 10 mg by mouth in the morning.   ascorbic acid (VITAMIN C) 100 MG tablet Take by mouth.   benazepril-hydrochlorthiazide (LOTENSIN HCT) 10-12.5 MG tablet    bismuth subsalicylate (PEPTO BISMOL) 262 MG chewable tablet Chew 262-524 mg by mouth 3 (three) times daily as needed for  diarrhea or loose stools or indigestion.   Budeson-Glycopyrrol-Formoterol (BREZTRI AEROSPHERE) 160-9-4.8 MCG/ACT AERO Inhale 2 puffs into the lungs in the morning and at bedtime.   Calcium Carb-Cholecalciferol (CALCIUM 600+D3 PO) Take 1 tablet by mouth in the morning.   calcium elemental as carbonate (TUMS ULTRA 1000) 400 MG chewable tablet Chew 1,000 mg by mouth 3 (three) times daily as needed for heartburn.   CANNABIDIOL PO Take 1 capsule by mouth at bedtime. CBD   Cholecalciferol (VITAMIN D-3) 125 MCG (5000 UT) TABS Take 5,000 Units by mouth in the morning.   diphenoxylate-atropine (LOMOTIL) 2.5-0.025 MG tablet Take 1 tablet by mouth 4 (four) times daily as needed for diarrhea or loose stools (AB pain).   Dupilumab (DUPIXENT) 300 MG/2ML SOAJ Inject 300 mg into the skin every 14 (fourteen) days.   famotidine (PEPCID) 40 MG tablet TAKE 1 TABLET BY MOUTH EVERYDAY AT BEDTIME   Ferrous Sulfate ([employer] SLOW RELEASE IRON PO) Take 65 mg by mouth 3 (three) times a week.   fexofenadine (ALLEGRA) 180 MG tablet Take 0.5 tablets (90 mg total) by mouth in the morning. (Patient taking differently: Take 90 mg by mouth in the morning. Patient gets OTC, please do not send it to pharmacy.)   hydrochlorothiazide (HYDRODIURIL) 12.5 MG tablet Take 12.5 mg by mouth every morning.   hydrocortisone 2.5 % cream Apply 1 Application topically as needed.   inFLIXimab (REMICADE) 100 MG injection Inject into the vein every 6 (six) weeks.   ipratropium (ATROVENT) 0.06 % nasal spray Place 1 spray into both nostrils daily.   leflunomide (ARAVA) 20 MG tablet Take 20 mg by mouth every evening.   losartan (COZAAR) 100 MG tablet Take 100 mg by mouth daily.   Magnesium 250 MG TABS Take 250 mg by mouth every evening.   MELATONIN PO Take 1.5 mg by mouth at bedtime as needed (sleep).   meloxicam (MOBIC) 15 MG tablet Take 15 mg by mouth as needed.   mirabegron ER (MYRBETRIQ) 25 MG TB24 tablet Take 1 tablet (25 mg total) by mouth daily.    Multiple Vitamins-Minerals (IMMUNE SUPPORT VITAMIN C PO) Take 1 tablet by mouth in the morning. With Vitamin D 3/Vitamin A/Magnesium   Na Sulfate-K Sulfate-Mg Sulf (SUPREP BOWEL PREP KIT) 17.5-3.13-1.6 GM/177ML SOLN Take 1 kit by mouth as directed.   Olopatadine HCl (PATADAY) 0.2 % SOLN Place 1 drop into both eyes daily as needed. (Patient taking differently: Place 1 drop into both eyes daily as needed. Patient gets OTC, please do not send it to pharmacy.)   Omega-3 Fatty Acids (FISH OIL) 1200 MG CAPS Take 1,200 mg by mouth 2 (two) times daily.   omeprazole (PRILOSEC) 40 MG capsule TAKE 1 CAPSULE (40 MG TOTAL) BY MOUTH 2 (TWO) TIMES DAILY BEFORE A  MEAL.   polyethylene glycol (MIRALAX / GLYCOLAX) 17 g packet Take 17 g by mouth every Monday, Wednesday, and Friday. In the morning   Psyllium (METAMUCIL 4 IN 1 FIBER PO) Take 2 capsules by mouth in the morning. Metamucil 3-in-1 Psyllium Fiber Supplement Capsules   rifaximin (XIFAXAN) 550 MG TABS tablet Take 1 tablet (550 mg total) by mouth 3 (three) times daily.   rosuvastatin (CRESTOR) 5 MG tablet Take 5 mg by mouth every evening.   simethicone (MYLICON) 125 MG chewable tablet Chew 125 mg by mouth every 6 (six) hours as needed for flatulence.   sodium chloride (OCEAN) 0.65 % SOLN nasal spray Place 1 spray into both nostrils in the morning and at bedtime.   temazepam (RESTORIL) 15 MG capsule Take 15 mg by mouth at bedtime as needed for sleep.   valACYclovir (VALTREX) 500 MG tablet Take 500 mg by mouth daily as needed (outbreaks).   [DISCONTINUED] Ferrous Gluconate (IRON) 240 (27 Fe) MG TABS    No facility-administered encounter medications on file as of 09/19/2023.     REVIEW OF SYSTEMS  : All other systems reviewed and negative except where noted in the History of Present Illness.   PHYSICAL EXAM: BP (!) 134/50 (BP Location: Left Arm, Patient Position: Sitting, Cuff Size: Normal)   Pulse 84   Ht 5\' 2"  (1.575 m)   Wt 130 lb (59 kg)   BMI 23.78  kg/m  General: Well developed white female in no acute distress Head: Normocephalic and atraumatic Eyes:  Sclerae anicteric, conjunctiva pink. Ears: Normal auditory acuity Lungs: Clear throughout to auscultation; no W/R/R. Heart: Regular rate and rhythm; no M/R/G. Abdomen: Soft, non-distended.  BS present and somewhat hyperactive.  Non-tender. Rectal:  Will be done at the time of colonoscopy. Musculoskeletal: Symmetrical with no gross deformities  Skin: No lesions on visible extremities Extremities: No edema  Neurological: Alert oriented x 4, grossly non-focal Psychological:  Alert and cooperative. Normal mood and affect  ASSESSMENT AND PLAN: *Chronic diarrhea with gas and bloating: Has undergone extensive evaluation since July including EGD, capsule endoscopy, CT scan, abdominal ultrasound, etc.  She actually just submitted stool for fecal elastase and is scheduled for colonoscopy next week.  Will rule out EPI, microscopic colitis, less likely IBD, etc.  If all these are normal consider IBS.  She also does not have a gallbladder so could try Questran as this may bile salt related.  IBgard and Lomotil do help her symptoms.  She is trying to work with a FODMAP diet as well.  Had somewhat equivocal SIBO breath test so could try to submit for rifaximin with a diagnosis of IBS-D.  Will await results of the studies that are already scheduled and being performed first.  Will instruct her on the colonoscopy for next week as that had not been done yet.   CC:  Alysia Penna, MD

## 2023-09-23 ENCOUNTER — Encounter: Payer: Self-pay | Admitting: Gastroenterology

## 2023-09-23 ENCOUNTER — Ambulatory Visit: Payer: PPO | Admitting: Gastroenterology

## 2023-09-23 VITALS — BP 124/55 | HR 61 | Temp 97.5°F | Resp 13 | Ht 62.0 in | Wt 130.0 lb

## 2023-09-23 DIAGNOSIS — K641 Second degree hemorrhoids: Secondary | ICD-10-CM | POA: Diagnosis not present

## 2023-09-23 DIAGNOSIS — K5730 Diverticulosis of large intestine without perforation or abscess without bleeding: Secondary | ICD-10-CM | POA: Diagnosis not present

## 2023-09-23 DIAGNOSIS — M069 Rheumatoid arthritis, unspecified: Secondary | ICD-10-CM | POA: Diagnosis not present

## 2023-09-23 DIAGNOSIS — K529 Noninfective gastroenteritis and colitis, unspecified: Secondary | ICD-10-CM | POA: Diagnosis not present

## 2023-09-23 DIAGNOSIS — D122 Benign neoplasm of ascending colon: Secondary | ICD-10-CM

## 2023-09-23 DIAGNOSIS — D120 Benign neoplasm of cecum: Secondary | ICD-10-CM | POA: Diagnosis not present

## 2023-09-23 DIAGNOSIS — F419 Anxiety disorder, unspecified: Secondary | ICD-10-CM | POA: Diagnosis not present

## 2023-09-23 DIAGNOSIS — K760 Fatty (change of) liver, not elsewhere classified: Secondary | ICD-10-CM | POA: Diagnosis not present

## 2023-09-23 MED ORDER — SODIUM CHLORIDE 0.9 % IV SOLN: 500.0000 mL | Freq: Once | INTRAVENOUS | Status: DC

## 2023-09-23 NOTE — Progress Notes (Signed)
Agree with the assessment and plan as outlined by Jessica Zehr, PA-C. ? ?Omesha Bowerman, DO, FACG ? ?

## 2023-09-23 NOTE — Patient Instructions (Addendum)
Thank you for letting us take care of your healthcare needs today. Please see handouts given to you on Polyps, hemorrhoids, and diverticulosis Take a daily fiber supplement      YOU HAD AN ENDOSCOPIC PROCEDURE TODAY AT THE [facility]:   Refer to the procedure report that was given to you for any specific questions about what was found during the examination.  If the procedure report does not answer your questions, please call your gastroenterologist to clarify.  If you requested that your care partner not be given the details of your procedure findings, then the procedure report has been included in a sealed envelope for you to review at your convenience later.  YOU SHOULD EXPECT: Some feelings of bloating in the abdomen. Passage of more gas than usual.  Walking can help get rid of the air that was put into your GI tract during the procedure and reduce the bloating. If you had a lower endoscopy (such as a colonoscopy or flexible sigmoidoscopy) you may notice spotting of blood in your stool or on the toilet paper. If you underwent a bowel prep for your procedure, you may not have a normal bowel movement for a few days.  Please Note:  You might notice some irritation and congestion in your nose or some drainage.  This is from the oxygen used during your procedure.  There is no need for concern and it should clear up in a day or so.  SYMPTOMS TO REPORT IMMEDIATELY:  Following lower endoscopy (colonoscopy or flexible sigmoidoscopy):  Excessive amounts of blood in the stool  Significant tenderness or worsening of abdominal pains  Swelling of the abdomen that is new, acute  Fever of 100F or higher  For urgent or emergent issues, a gastroenterologist can be reached at any hour by calling [phone]. Do not use MyChart messaging for urgent concerns.    DIET:  We do recommend a small meal at first, but then you may proceed to your regular diet.  Drink plenty of fluids but you  should avoid alcoholic beverages for 24 hours.  ACTIVITY:  You should plan to take it easy for the rest of today and you should NOT DRIVE or use heavy machinery until tomorrow (because of the sedation medicines used during the test).    FOLLOW UP: Our staff will call the number listed on your records the next business day following your procedure.  We will call around 7:15- 8:00 am to check on you and address any questions or concerns that you may have regarding the information given to you following your procedure. If we do not reach you, we will leave a message.     If any biopsies were taken you will be contacted by phone or by letter within the next 1-3 weeks.  Please call us at [phone] if you have not heard about the biopsies in 3 weeks.    SIGNATURES/CONFIDENTIALITY: You and/or your care partner have signed paperwork which will be entered into your electronic medical record.  These signatures attest to the fact that that the information above on your After Visit Summary has been reviewed and is understood.  Full responsibility of the confidentiality of this discharge information lies with you and/or your care-partner.

## 2023-09-23 NOTE — Progress Notes (Signed)
Sedate, gd SR, tolerated procedure well, VSS, report to RN 

## 2023-09-23 NOTE — Progress Notes (Signed)
Called to room to assist during endoscopic procedure.  Patient ID and intended procedure confirmed with present staff. Received instructions for my participation in the procedure from the performing physician.  

## 2023-09-23 NOTE — Op Note (Signed)
[facility] Patient Name: Lin Hackmann Procedure Date: 09/23/2023 11:22 AM MRN: [id number] Endoscopist: Doristine Locks , MD, [id number] Age: 72 Referring MD:  Date of Birth: Aug 23, 1951 Gender: Female Account #: [id number] Procedure:                Colonoscopy Indications:              Change in bowel habits, Diarrhea Medicines:                Monitored Anesthesia Care Procedure:                Pre-Anesthesia Assessment:                           - Prior to the procedure, a History and Physical                            was performed, and patient medications and                            allergies were reviewed. The patient's tolerance of                            previous anesthesia was also reviewed. The risks                            and benefits of the procedure and the sedation                            options and risks were discussed with the patient.                            All questions were answered, and informed consent                            was obtained. Prior Anticoagulants: The patient has                            taken no anticoagulant or antiplatelet agents. ASA                            Grade Assessment: II - A patient with mild systemic                            disease. After reviewing the risks and benefits,                            the patient was deemed in satisfactory condition to                            undergo the procedure.                           After obtaining informed consent, the colonoscope  was passed under direct vision. Throughout the                            procedure, the patient's blood pressure, pulse, and                            oxygen saturations were monitored continuously. The                            [id number]-[id number] Colonoscope [id number] was introduced                            through the anus and advanced to the 10 cm into the                            ileum. The  colonoscopy was performed without                            difficulty. The patient tolerated the procedure                            well. The quality of the bowel preparation was                            good. The terminal ileum, ileocecal valve,                            appendiceal orifice, and rectum were photographed. Scope In: 11:34:04 AM Scope Out: 11:47:41 AM Scope Withdrawal Time: 0 hours 10 minutes 35 seconds  Total Procedure Duration: 0 hours 13 minutes 37 seconds  Findings:                 Hemorrhoids were found on perianal exam.                           Two sessile polyps were found in the proximal                            ascending colon and cecum. The polyps were 2 to 6                            mm in size. These polyps were removed with a cold                            snare. Resection and retrieval were complete.                            Estimated blood loss was minimal.                           Multiple large-mouthed and small-mouthed                            diverticula were found in the sigmoid colon,  descending colon, transverse colon and ascending                            colon.                           Normal mucosa was found in the entire colon.                            Biopsies for histology were taken with a cold                            forceps from the right colon and left colon for                            evaluation of microscopic colitis. Estimated blood                            loss was minimal.                           Internal hemorrhoids were found during                            retroflexion. The hemorrhoids were small and Grade                            II (internal hemorrhoids that prolapse but reduce                            spontaneously).                           The terminal ileum appeared normal. Complications:            No immediate complications. Estimated Blood Loss:     Estimated  blood loss was minimal. Impression:               - Hemorrhoids found on perianal exam.                           - Two 2 to 6 mm polyps in the proximal ascending                            colon and in the cecum, removed with a cold snare.                            Resected and retrieved.                           - Diverticulosis in the sigmoid colon, in the                            descending colon, in the transverse colon and in  the ascending colon.                           - Normal mucosa in the entire examined colon.                            Biopsied.                           - Internal hemorrhoids.                           - The examined portion of the ileum was normal. Recommendation:           - Patient has a contact number available for                            emergencies. The signs and symptoms of potential                            delayed complications were discussed with the                            patient. Return to normal activities tomorrow.                            Written discharge instructions were provided to the                            patient.                           - Resume previous diet.                           - Continue present medications.                           - Await pathology results.                           - Repeat colonoscopy for surveillance based on                            pathology results.                           - Return to GI office at appointment to be                            scheduled.                           - Use fiber, for example Citrucel, Fibercon, Konsyl                            or Metamucil. Doristine Locks, MD 09/23/2023 12:10:03 PM

## 2023-09-23 NOTE — Progress Notes (Signed)
GASTROENTEROLOGY PROCEDURE H&P NOTE   Primary Care Physician: Alysia Penna, MD    Reason for Procedure:  Chronic diarrhea  Plan:    Colonoscopy  Patient is appropriate for endoscopic procedure(s) in the ambulatory ([facility]) setting.  The nature of the procedure, as well as the risks, benefits, and alternatives were carefully and thoroughly reviewed with the patient. Ample time for discussion and questions allowed. The patient understood, was satisfied, and agreed to proceed.     HPI: Courtney Crane is a 72 y.o. female who presents for colonoscopy for evaluation of ongoing chronic, nonbloody diarrhea and abdominal bloating.  Patient was most recently seen in the Gastroenterology Clinic on 09/19/2023.  No interval change in medical history since that appointment. Please refer to that note for full details regarding GI history and clinical presentation.   Past Medical History:  Diagnosis Date   Allergy    Anemia    Anxiety    Arthritis    Asthma    Cataract    Colon polyps    Complication of anesthesia    "sometimes I dont like to wake up after " ie difficult to awaken    Diverticulitis    Diverticulosis    Fatty liver    GERD (gastroesophageal reflux disease)    HLD (hyperlipidemia)    HTN (hypertension)    IBS (irritable bowel syndrome)    Insomnia    MRSA infection    Osteoarthritis    Osteopenia    PONV (postoperative nausea and vomiting)    RA (rheumatoid arthritis) (HCC)    Rheumatoid arthritis (HCC)    Seasonal allergies    Shingles    Status post dilation of esophageal narrowing    Vitamin D deficiency     Past Surgical History:  Procedure Laterality Date   ADENOIDECTOMY     APPENDECTOMY  1975   bladder tack  1990   CATARACT EXTRACTION, BILATERAL Bilateral    CHOLECYSTECTOMY N/A 01/31/2017   Procedure: LAPAROSCOPIC CHOLECYSTECTOMY WITH INTRAOPERATIVE CHOLANGIOGRAM;  Surgeon: Darnell Level, MD;  Location: [facility];  Service: General;  Laterality: N/A;    INCISION AND DRAINAGE PERIRECTAL ABSCESS N/A 12/02/2017   Procedure: IRRIGATION AND DEBRIDEMENT PERINEAL ABSCESS;  Surgeon: Gaynelle Adu, MD;  Location: [facility] OR;  Service: General;  Laterality: N/A;   LUMBAR FUSION     MENISCUS REPAIR Right 2013   REVERSE SHOULDER ARTHROPLASTY Right 12/06/2022   Procedure: RIGHT REVERSE SHOULDER ARTHROPLASTY;  Surgeon: Jones Broom, MD;  Location: [facility];  Service: Orthopedics;  Laterality: Right;   ROTATOR CUFF REPAIR Right 2005   TONSILLECTOMY     as a child   TOTAL ABDOMINAL HYSTERECTOMY  1988   TOTAL KNEE ARTHROPLASTY Left    WRIST RECONSTRUCTION Right 2008    Prior to Admission medications   Medication Sig Start Date End Date Taking? Authorizing Provider  albuterol (VENTOLIN HFA) 108 (90 Base) MCG/ACT inhaler Inhale 2 puffs into the lungs every 6 (six) hours as needed for wheezing or shortness of breath. 07/05/23  Yes Padgett, Pilar Grammes, MD  amLODipine (NORVASC) 10 MG tablet Take 10 mg by mouth in the morning.   Yes [provider]  ascorbic acid (VITAMIN C) 100 MG tablet Take by mouth. 07/19/20  Yes [provider]  benazepril-hydrochlorthiazide (LOTENSIN HCT) 10-12.5 MG tablet  06/06/23  Yes [provider]  bismuth subsalicylate (PEPTO BISMOL) 262 MG chewable tablet Chew 262-524 mg by mouth 3 (three) times daily as needed for diarrhea or loose stools or indigestion.  Yes [provider]  Budeson-Glycopyrrol-Formoterol (BREZTRI AEROSPHERE) 160-9-4.8 MCG/ACT AERO Inhale 2 puffs into the lungs in the morning and at bedtime. 07/05/23  Yes Padgett, Pilar Grammes, MD  Calcium Carb-Cholecalciferol (CALCIUM 600+D3 PO) Take 1 tablet by mouth in the morning.   Yes [provider]  calcium elemental as carbonate (TUMS ULTRA 1000) 400 MG chewable tablet Chew 1,000 mg by mouth 3 (three) times daily as needed for heartburn.   Yes [provider]  CANNABIDIOL PO Take 1 capsule by mouth at bedtime. CBD    Yes [provider]  Cholecalciferol (VITAMIN D-3) 125 MCG (5000 UT) TABS Take 5,000 Units by mouth in the morning.   Yes [provider]  diphenoxylate-atropine (LOMOTIL) 2.5-0.025 MG tablet Take 1 tablet by mouth 4 (four) times daily as needed for diarrhea or loose stools (AB pain). 08/30/23  Yes Quentin Mulling R, PA-C  famotidine (PEPCID) 40 MG tablet TAKE 1 TABLET BY MOUTH EVERYDAY AT BEDTIME 10/30/22  Yes Esterwood, Amy S, PA-C  fexofenadine (ALLEGRA) 180 MG tablet Take 0.5 tablets (90 mg total) by mouth in the morning. Patient taking differently: Take 90 mg by mouth in the morning. Patient gets OTC, please do not send it to pharmacy. 07/05/23  Yes Padgett, Pilar Grammes, MD  hydrochlorothiazide (HYDRODIURIL) 12.5 MG tablet Take 12.5 mg by mouth every morning.   Yes [provider]  ipratropium (ATROVENT) 0.06 % nasal spray Place 1 spray into both nostrils daily. 07/05/23  Yes Padgett, Pilar Grammes, MD  leflunomide (ARAVA) 20 MG tablet Take 20 mg by mouth every evening. 04/06/22  Yes [provider]  losartan (COZAAR) 100 MG tablet Take 100 mg by mouth daily.   Yes [provider]  Magnesium 250 MG TABS Take 250 mg by mouth every evening.   Yes [provider]  MELATONIN PO Take 1.5 mg by mouth at bedtime as needed (sleep).   Yes [provider]  mirabegron ER (MYRBETRIQ) 25 MG TB24 tablet Take 1 tablet (25 mg total) by mouth daily. 08/02/23  Yes Marguerita Beards, MD  Multiple Vitamins-Minerals (IMMUNE SUPPORT VITAMIN C PO) Take 1 tablet by mouth in the morning. With Vitamin D 3/Vitamin A/Magnesium   Yes [provider]  Omega-3 Fatty Acids (FISH OIL) 1200 MG CAPS Take 1,200 mg by mouth 2 (two) times daily.   Yes [provider]  omeprazole (PRILOSEC) 40 MG capsule TAKE 1 CAPSULE (40 MG TOTAL) BY MOUTH 2 (TWO) TIMES DAILY BEFORE A MEAL. 08/26/23  Yes Quentin Mulling R, PA-C  rosuvastatin (CRESTOR) 5 MG  tablet Take 5 mg by mouth every evening. 08/15/21  Yes [provider]  simethicone (MYLICON) 125 MG chewable tablet Chew 125 mg by mouth every 6 (six) hours as needed for flatulence.   Yes [provider]  sodium chloride (OCEAN) 0.65 % SOLN nasal spray Place 1 spray into both nostrils in the morning and at bedtime.   Yes [provider]  temazepam (RESTORIL) 15 MG capsule Take 15 mg by mouth at bedtime as needed for sleep. 05/24/23  Yes [provider]  acetaminophen (TYLENOL) 500 MG tablet Take 1,000 mg by mouth every 6 (six) hours as needed (pain.).    [provider]  Dupilumab (DUPIXENT) 300 MG/2ML SOAJ Inject 300 mg into the skin every 14 (fourteen) days. 09/16/23   Josephine Igo, DO  Ferrous Sulfate ([employer] SLOW RELEASE IRON PO) Take 65 mg by mouth 3 (three) times a week.  [provider]  hydrocortisone 2.5 % cream Apply 1 Application topically as needed. 06/21/23   [provider]  inFLIXimab (REMICADE) 100 MG injection Inject into the vein every 6 (six) weeks.    [provider]  meloxicam (MOBIC) 15 MG tablet Take 15 mg by mouth as needed. 07/15/23   [provider]  Olopatadine HCl (PATADAY) 0.2 % SOLN Place 1 drop into both eyes daily as needed. Patient taking differently: Place 1 drop into both eyes daily as needed. Patient gets OTC, please do not send it to pharmacy. 07/05/23   Marcelyn Bruins, MD  polyethylene glycol [facility] Ethelene Hal) 17 g packet Take 17 g by mouth every Monday, Wednesday, and Friday. In the morning    [provider]  Psyllium (METAMUCIL 4 IN 1 FIBER PO) Take 2 capsules by mouth in the morning. Metamucil 3-in-1 Psyllium Fiber Supplement Capsules    [provider]  rifaximin (XIFAXAN) 550 MG TABS tablet Take 1 tablet (550 mg total) by mouth 3 (three) times daily. 07/31/23   Kobie Whidby V, DO  valACYclovir (VALTREX) 500 MG tablet Take 500 mg by mouth daily as  needed (outbreaks).    [provider]    Current Outpatient Medications  Medication Sig Dispense Refill   albuterol (VENTOLIN HFA) 108 (90 Base) MCG/ACT inhaler Inhale 2 puffs into the lungs every 6 (six) hours as needed for wheezing or shortness of breath. 18 g 1   amLODipine (NORVASC) 10 MG tablet Take 10 mg by mouth in the morning.     ascorbic acid (VITAMIN C) 100 MG tablet Take by mouth.     benazepril-hydrochlorthiazide (LOTENSIN HCT) 10-12.5 MG tablet      bismuth subsalicylate (PEPTO BISMOL) 262 MG chewable tablet Chew 262-524 mg by mouth 3 (three) times daily as needed for diarrhea or loose stools or indigestion.     Budeson-Glycopyrrol-Formoterol (BREZTRI AEROSPHERE) 160-9-4.8 MCG/ACT AERO Inhale 2 puffs into the lungs in the morning and at bedtime. 10.7 g 5   Calcium Carb-Cholecalciferol (CALCIUM 600+D3 PO) Take 1 tablet by mouth in the morning.     calcium elemental as carbonate (TUMS ULTRA 1000) 400 MG chewable tablet Chew 1,000 mg by mouth 3 (three) times daily as needed for heartburn.     CANNABIDIOL PO Take 1 capsule by mouth at bedtime. CBD     Cholecalciferol (VITAMIN D-3) 125 MCG (5000 UT) TABS Take 5,000 Units by mouth in the morning.     diphenoxylate-atropine (LOMOTIL) 2.5-0.025 MG tablet Take 1 tablet by mouth 4 (four) times daily as needed for diarrhea or loose stools (AB pain). 30 tablet 0   famotidine (PEPCID) 40 MG tablet TAKE 1 TABLET BY MOUTH EVERYDAY AT BEDTIME 90 tablet 3   fexofenadine (ALLEGRA) 180 MG tablet Take 0.5 tablets (90 mg total) by mouth in the morning. (Patient taking differently: Take 90 mg by mouth in the morning. Patient gets OTC, please do not send it to pharmacy.) 30 tablet 5   hydrochlorothiazide (HYDRODIURIL) 12.5 MG tablet Take 12.5 mg by mouth every morning.     ipratropium (ATROVENT) 0.06 % nasal spray Place 1 spray into both nostrils daily. 15 mL 5   leflunomide (ARAVA) 20 MG tablet Take 20 mg by mouth every evening.     losartan  (COZAAR) 100 MG tablet Take 100 mg by mouth daily.     Magnesium 250 MG TABS Take 250 mg by mouth every evening.     MELATONIN PO Take 1.5 mg  by mouth at bedtime as needed (sleep).     mirabegron ER (MYRBETRIQ) 25 MG TB24 tablet Take 1 tablet (25 mg total) by mouth daily. 90 tablet 3   Multiple Vitamins-Minerals (IMMUNE SUPPORT VITAMIN C PO) Take 1 tablet by mouth in the morning. With Vitamin D 3/Vitamin A/Magnesium     Omega-3 Fatty Acids (FISH OIL) 1200 MG CAPS Take 1,200 mg by mouth 2 (two) times daily.     omeprazole (PRILOSEC) 40 MG capsule TAKE 1 CAPSULE (40 MG TOTAL) BY MOUTH 2 (TWO) TIMES DAILY BEFORE A MEAL. 180 capsule 0   rosuvastatin (CRESTOR) 5 MG tablet Take 5 mg by mouth every evening.     simethicone (MYLICON) 125 MG chewable tablet Chew 125 mg by mouth every 6 (six) hours as needed for flatulence.     sodium chloride (OCEAN) 0.65 % SOLN nasal spray Place 1 spray into both nostrils in the morning and at bedtime.     temazepam (RESTORIL) 15 MG capsule Take 15 mg by mouth at bedtime as needed for sleep.     acetaminophen (TYLENOL) 500 MG tablet Take 1,000 mg by mouth every 6 (six) hours as needed (pain.).     Dupilumab (DUPIXENT) 300 MG/2ML SOAJ Inject 300 mg into the skin every 14 (fourteen) days. 12 mL 0   Ferrous Sulfate ([employer] SLOW RELEASE IRON PO) Take 65 mg by mouth 3 (three) times a week.     hydrocortisone 2.5 % cream Apply 1 Application topically as needed.     inFLIXimab (REMICADE) 100 MG injection Inject into the vein every 6 (six) weeks.     meloxicam (MOBIC) 15 MG tablet Take 15 mg by mouth as needed.     Olopatadine HCl (PATADAY) 0.2 % SOLN Place 1 drop into both eyes daily as needed. (Patient taking differently: Place 1 drop into both eyes daily as needed. Patient gets OTC, please do not send it to pharmacy.) 2.5 mL 5   polyethylene glycol (MIRALAX / GLYCOLAX) 17 g packet Take 17 g by mouth every Monday, Wednesday, and Friday. In the morning     Psyllium (METAMUCIL 4 IN  1 FIBER PO) Take 2 capsules by mouth in the morning. Metamucil 3-in-1 Psyllium Fiber Supplement Capsules     rifaximin (XIFAXAN) 550 MG TABS tablet Take 1 tablet (550 mg total) by mouth 3 (three) times daily. 42 tablet 0   valACYclovir (VALTREX) 500 MG tablet Take 500 mg by mouth daily as needed (outbreaks).     Current Facility-Administered Medications  Medication Dose Route Frequency Provider Last Rate Last Admin   0.9 %  sodium chloride infusion  500 mL Intravenous Once Jeannelle Wiens V, DO        Allergies as of 09/23/2023 - Review Complete 09/23/2023  Allergen Reaction Noted   Meperidine Nausea Only 10/08/2014   Methotrexate Other (See Comments) 10/18/2021   Nickel Rash 01/11/2018   Tape Rash and Other (See Comments) 10/18/2021    Family History  Problem Relation Age of Onset   Colon polyps Mother    Diabetes Mother    Heart disease Mother    Kidney cancer Mother    Arthritis Mother    Hypertension Mother    Hyperlipidemia Mother    Dementia Father    CAD Brother    Esophageal cancer Brother    Colon polyps Son    Colon cancer Neg Hx    Stomach cancer Neg Hx    Rectal cancer Neg Hx    Prostate cancer  Neg Hx    Pancreatic cancer Neg Hx     Social History   Socioeconomic History   Marital status: Married    Spouse name: Not on file   Number of children: 2   Years of education: Not on file   Highest education level: Not on file  Occupational History   Occupation: retired  Tobacco Use   Smoking status: Never    Passive exposure: Never   Smokeless tobacco: Never  Vaping Use   Vaping status: Never Used  Substance and Sexual Activity   Alcohol use: Yes    Alcohol/week: 7.0 standard drinks of alcohol    Types: 7 Standard drinks or equivalent per week    Comment: wine daily   Drug use: No    Comment: takes CBD oil   Sexual activity: Not Currently    Partners: Male    Birth control/protection: Post-menopausal    Comment: Hx HSV, High RISK Medicare  Other  Topics Concern   Not on file  Social History Narrative   Retired from [employer]   Lives at home with husband ; one level home   Right handed   Highest level of education:  Some college   Social Determinants of Health   Financial Resource Strain: Not on file  Food Insecurity: Not on file  Transportation Needs: Not on file  Physical Activity: Not on file  Stress: Not on file  Social Connections: Not on file  Intimate Partner Violence: Not on file    Physical Exam: Vital signs in last 24 hours: @BP  112/73   Pulse 78   Temp (!) 97.5 F (36.4 C)   Resp 14   Ht 5\' 2"  (1.575 m)   Wt 130 lb (59 kg)   SpO2 100%   BMI 23.78 kg/m  GEN: NAD EYE: Sclerae anicteric ENT: MMM CV: Non-tachycardic Pulm: CTA b/l GI: Soft, NT/ND NEURO:  Alert & Oriented x 3   Doristine Locks, DO [facility]   09/23/2023 11:26 AM

## 2023-09-23 NOTE — Progress Notes (Signed)
Pt's states no medical or surgical changes since previsit or office visit. 

## 2023-09-24 ENCOUNTER — Telehealth: Payer: Self-pay

## 2023-09-24 DIAGNOSIS — M1811 Unilateral primary osteoarthritis of first carpometacarpal joint, right hand: Secondary | ICD-10-CM | POA: Diagnosis not present

## 2023-09-24 DIAGNOSIS — M19031 Primary osteoarthritis, right wrist: Secondary | ICD-10-CM | POA: Diagnosis not present

## 2023-09-24 NOTE — Telephone Encounter (Signed)
  Follow up Call-     09/23/2023   10:55 AM 07/29/2023    1:07 PM  Call back number  Post procedure Call Back phone  # [phone]  Permission to leave phone message Yes Yes     Patient questions:  Do you have a fever, pain , or abdominal swelling? No. Pain Score  0 *  Have you tolerated food without any problems? Yes.    Have you been able to return to your normal activities? Yes.    Do you have any questions about your discharge instructions: Diet   No. Medications  No. Follow up visit  No.  Do you have questions or concerns about your Care? No.  Actions: * If pain score is 4 or above: No action needed, pain <4.

## 2023-09-25 LAB — SURGICAL PATHOLOGY

## 2023-09-26 LAB — PANCREATIC ELASTASE, FECAL: Pancreatic Elastase-1, Stool: 360 ug/g

## 2023-09-27 ENCOUNTER — Other Ambulatory Visit: Payer: Self-pay | Admitting: Medical Genetics

## 2023-09-27 ENCOUNTER — Telehealth: Payer: Self-pay | Admitting: Gastroenterology

## 2023-09-27 DIAGNOSIS — D509 Iron deficiency anemia, unspecified: Secondary | ICD-10-CM | POA: Diagnosis not present

## 2023-09-27 DIAGNOSIS — R197 Diarrhea, unspecified: Secondary | ICD-10-CM | POA: Diagnosis not present

## 2023-09-27 DIAGNOSIS — F330 Major depressive disorder, recurrent, mild: Secondary | ICD-10-CM | POA: Diagnosis not present

## 2023-09-27 DIAGNOSIS — R5383 Other fatigue: Secondary | ICD-10-CM | POA: Diagnosis not present

## 2023-09-27 DIAGNOSIS — Z006 Encounter for examination for normal comparison and control in clinical research program: Secondary | ICD-10-CM

## 2023-09-27 DIAGNOSIS — K580 Irritable bowel syndrome with diarrhea: Secondary | ICD-10-CM

## 2023-09-27 NOTE — Telephone Encounter (Signed)
Pharmacy Patient Advocate Encounter  Received notification from [facility] [employer] that Prior Authorization for [facility] 550MG  has been DENIED.  Full denial letter will be uploaded to the media tab. See denial reason below.   PA #/Case ID/Reference #:

## 2023-09-27 NOTE — Telephone Encounter (Signed)
According to the pathology report from 11.4.24 with no evidence of microscopic colitis or chronic inflammatory changes, then IBS-D would be an indication for the use of rifaximin.  Please proceed with the appeal process.

## 2023-09-27 NOTE — Telephone Encounter (Signed)
Inbound call from patient, returning Nicole's call in regards to pathology results.

## 2023-09-27 NOTE — Telephone Encounter (Signed)
Patient notified of lab results per Dr Barron Alvine.  Patient would like to proceed with trial of rifaximin which will need a prior authorization.  Will work with PA team to get this process started.  Patient agreed to plan and verbalized understanding.  No further questions.

## 2023-10-01 ENCOUNTER — Other Ambulatory Visit: Payer: Self-pay | Admitting: Physician Assistant

## 2023-10-01 DIAGNOSIS — K580 Irritable bowel syndrome with diarrhea: Secondary | ICD-10-CM

## 2023-10-01 HISTORY — DX: Irritable bowel syndrome with diarrhea: K58.0

## 2023-10-01 MED ORDER — RIFAXIMIN 550 MG PO TABS: 550.0000 mg | ORAL_TABLET | Freq: Three times a day (TID) | ORAL | 0 refills | Status: DC

## 2023-10-01 NOTE — Telephone Encounter (Signed)
New script for rifaximin 550mg  tid for 14 days has been resent to the pharmacy with diagnoses/diagnoses code attached to the Rx.  Irritiable bowel syndrome with diarrhea has been added to the patients problem list.  Prior auth team aware of these changes and asked to proceed with appeal process as requested by Dr Frankey Shown verbal request.

## 2023-10-02 NOTE — Telephone Encounter (Signed)
An expedited appeal has been submitted for Xifaxan. Will advise when response is received.  Dellie Burns, PharmD Clinical Pharmacist [place]  Direct Dial: [phone]

## 2023-10-04 ENCOUNTER — Ambulatory Visit: Payer: PPO | Admitting: Pulmonary Disease

## 2023-10-04 ENCOUNTER — Other Ambulatory Visit (HOSPITAL_COMMUNITY): Payer: Self-pay

## 2023-10-04 MED ORDER — METRONIDAZOLE 250 MG PO TABS
250.0000 mg | ORAL_TABLET | Freq: Three times a day (TID) | ORAL | 0 refills | Status: AC
Start: 2023-10-04 — End: 2023-10-14

## 2023-10-04 NOTE — Addendum Note (Signed)
Addended by: Quentin Mulling on: 10/04/2023 09:53 AM   Modules accepted: Orders

## 2023-10-07 ENCOUNTER — Other Ambulatory Visit (HOSPITAL_COMMUNITY): Payer: Self-pay

## 2023-10-08 ENCOUNTER — Other Ambulatory Visit (HOSPITAL_COMMUNITY): Payer: Self-pay

## 2023-10-08 NOTE — Telephone Encounter (Signed)
Patient aware that Courtney Crane has been approved by insurance and she should be able to pick medication up at the pharmacy.  Patient agreed to the plan and verbalized understanding.  No further questions.

## 2023-10-08 NOTE — Telephone Encounter (Signed)
Patient aware that Burman Blacksmith has been approved by insurance and she should be able to pick medication up at the pharmacy.  Patient agreed to the plan and verbalized understanding.  No further questions.

## 2023-10-08 NOTE — Telephone Encounter (Signed)
Pharmacy Patient Advocate Encounter  Received notification from [facility] [employer]/RX [employer] that Prior Authorization for Xifaxan 550mg  tablets has been APPROVED   Letter has been uploaded to the Media Tab

## 2023-10-09 DIAGNOSIS — E785 Hyperlipidemia, unspecified: Secondary | ICD-10-CM | POA: Diagnosis not present

## 2023-10-09 DIAGNOSIS — D509 Iron deficiency anemia, unspecified: Secondary | ICD-10-CM | POA: Diagnosis not present

## 2023-10-09 DIAGNOSIS — Z1389 Encounter for screening for other disorder: Secondary | ICD-10-CM | POA: Diagnosis not present

## 2023-10-09 DIAGNOSIS — G729 Myopathy, unspecified: Secondary | ICD-10-CM | POA: Diagnosis not present

## 2023-10-09 DIAGNOSIS — Z0000 Encounter for general adult medical examination without abnormal findings: Secondary | ICD-10-CM | POA: Diagnosis not present

## 2023-10-09 DIAGNOSIS — K580 Irritable bowel syndrome with diarrhea: Secondary | ICD-10-CM | POA: Diagnosis not present

## 2023-10-09 DIAGNOSIS — M791 Myalgia, unspecified site: Secondary | ICD-10-CM | POA: Diagnosis not present

## 2023-10-10 ENCOUNTER — Other Ambulatory Visit (HOSPITAL_COMMUNITY)
Admission: RE | Admit: 2023-10-10 | Discharge: 2023-10-10 | Disposition: A | Discharge status: Home / Self Care | Payer: PPO | Source: Ambulatory Visit | Attending: Medical Genetics | Admitting: Medical Genetics

## 2023-10-10 DIAGNOSIS — Z006 Encounter for examination for normal comparison and control in clinical research program: Secondary | ICD-10-CM | POA: Insufficient documentation

## 2023-10-11 ENCOUNTER — Other Ambulatory Visit: Payer: Self-pay | Admitting: Internal Medicine

## 2023-10-11 DIAGNOSIS — D508 Other iron deficiency anemias: Secondary | ICD-10-CM

## 2023-10-12 ENCOUNTER — Inpatient Hospital Stay: Payer: PPO

## 2023-10-12 ENCOUNTER — Inpatient Hospital Stay: Payer: PPO | Attending: Internal Medicine | Admitting: Internal Medicine

## 2023-10-12 VITALS — BP 141/71 | HR 78 | Temp 98.2°F | Resp 16 | Wt 132.4 lb

## 2023-10-12 DIAGNOSIS — M069 Rheumatoid arthritis, unspecified: Secondary | ICD-10-CM | POA: Diagnosis not present

## 2023-10-12 DIAGNOSIS — K580 Irritable bowel syndrome with diarrhea: Secondary | ICD-10-CM | POA: Diagnosis not present

## 2023-10-12 DIAGNOSIS — I10 Essential (primary) hypertension: Secondary | ICD-10-CM | POA: Insufficient documentation

## 2023-10-12 DIAGNOSIS — D508 Other iron deficiency anemias: Secondary | ICD-10-CM | POA: Diagnosis not present

## 2023-10-12 DIAGNOSIS — Z800 Family history of malignant neoplasm of digestive organs: Secondary | ICD-10-CM | POA: Insufficient documentation

## 2023-10-12 DIAGNOSIS — K760 Fatty (change of) liver, not elsewhere classified: Secondary | ICD-10-CM | POA: Insufficient documentation

## 2023-10-12 DIAGNOSIS — E7800 Pure hypercholesterolemia, unspecified: Secondary | ICD-10-CM | POA: Insufficient documentation

## 2023-10-12 DIAGNOSIS — R630 Anorexia: Secondary | ICD-10-CM | POA: Insufficient documentation

## 2023-10-12 DIAGNOSIS — K589 Irritable bowel syndrome without diarrhea: Secondary | ICD-10-CM | POA: Insufficient documentation

## 2023-10-12 DIAGNOSIS — Z7969 Long term (current) use of other immunomodulators and immunosuppressants: Secondary | ICD-10-CM | POA: Insufficient documentation

## 2023-10-12 DIAGNOSIS — R5383 Other fatigue: Secondary | ICD-10-CM | POA: Insufficient documentation

## 2023-10-12 DIAGNOSIS — K219 Gastro-esophageal reflux disease without esophagitis: Secondary | ICD-10-CM | POA: Diagnosis not present

## 2023-10-12 DIAGNOSIS — Z79899 Other long term (current) drug therapy: Secondary | ICD-10-CM | POA: Insufficient documentation

## 2023-10-12 DIAGNOSIS — R197 Diarrhea, unspecified: Secondary | ICD-10-CM | POA: Diagnosis not present

## 2023-10-12 DIAGNOSIS — J45901 Unspecified asthma with (acute) exacerbation: Secondary | ICD-10-CM | POA: Insufficient documentation

## 2023-10-12 DIAGNOSIS — D509 Iron deficiency anemia, unspecified: Secondary | ICD-10-CM | POA: Insufficient documentation

## 2023-10-12 DIAGNOSIS — E559 Vitamin D deficiency, unspecified: Secondary | ICD-10-CM | POA: Diagnosis not present

## 2023-10-12 DIAGNOSIS — E785 Hyperlipidemia, unspecified: Secondary | ICD-10-CM | POA: Diagnosis not present

## 2023-10-12 LAB — CBC WITH DIFFERENTIAL (CANCER CENTER ONLY)
Abs Immature Granulocytes: 0.02 10*3/uL (ref 0.00–0.07)
Basophils Absolute: 0.1 10*3/uL (ref 0.0–0.1)
Basophils Relative: 1 %
Eosinophils Absolute: 0.3 10*3/uL (ref 0.0–0.5)
Eosinophils Relative: 4 %
HCT: 37.2 % (ref 36.0–46.0)
Hemoglobin: 12.1 g/dL (ref 12.0–15.0)
Immature Granulocytes: 0 %
Lymphocytes Relative: 22 %
Lymphs Abs: 1.7 10*3/uL (ref 0.7–4.0)
MCH: 31.3 pg (ref 26.0–34.0)
MCHC: 32.5 g/dL (ref 30.0–36.0)
MCV: 96.1 fL (ref 80.0–100.0)
Monocytes Absolute: 0.8 10*3/uL (ref 0.1–1.0)
Monocytes Relative: 10 %
Neutro Abs: 4.9 10*3/uL (ref 1.7–7.7)
Neutrophils Relative %: 63 %
Platelet Count: 150 10*3/uL (ref 150–400)
RBC: 3.87 MIL/uL (ref 3.87–5.11)
RDW: 15.3 % (ref 11.5–15.5)
WBC Count: 7.9 10*3/uL (ref 4.0–10.5)
nRBC: 0 % (ref 0.0–0.2)

## 2023-10-12 LAB — IRON AND IRON BINDING CAPACITY (CC-WL,HP ONLY)
Iron: 66 ug/dL (ref 28–170)
Saturation Ratios: 14 % (ref 10.4–31.8)
TIBC: 489 ug/dL — ABNORMAL HIGH (ref 250–450)
UIBC: 423 ug/dL (ref 148–442)

## 2023-10-12 LAB — CMP (CANCER CENTER ONLY)
ALT: 63 U/L — ABNORMAL HIGH (ref 0–44)
AST: 28 U/L (ref 15–41)
Albumin: 4.1 g/dL (ref 3.5–5.0)
Alkaline Phosphatase: 62 U/L (ref 38–126)
Anion gap: 8 (ref 5–15)
BUN: 19 mg/dL (ref 8–23)
CO2: 27 mmol/L (ref 22–32)
Calcium: 9.7 mg/dL (ref 8.9–10.3)
Chloride: 102 mmol/L (ref 98–111)
Creatinine: 0.88 mg/dL (ref 0.44–1.00)
GFR, Estimated: >60 mL/min (ref >60)
Glucose, Bld: 110 mg/dL — ABNORMAL HIGH (ref 70–99)
Potassium: 3.7 mmol/L (ref 3.5–5.1)
Sodium: 137 mmol/L (ref 135–145)
Total Bilirubin: 0.4 mg/dL (ref <1.2)
Total Protein: 8.2 g/dL — ABNORMAL HIGH (ref 6.5–8.1)

## 2023-10-12 LAB — FOLATE: Folate: 36.9 ng/mL (ref >5.9)

## 2023-10-12 LAB — VITAMIN B12: Vitamin B-12: 674 pg/mL (ref 180–914)

## 2023-10-12 LAB — FERRITIN: Ferritin: 21 ng/mL (ref 11–307)

## 2023-10-12 LAB — TSH: TSH: 2.922 u[IU]/mL (ref 0.350–4.500)

## 2023-10-12 LAB — LACTATE DEHYDROGENASE: LDH: 161 U/L (ref 98–192)

## 2023-10-12 NOTE — Progress Notes (Signed)
[facility] [phone]   [phone]  CONSULT NOTE  REFERRING PHYSICIAN: Dr. Alysia Penna  REASON FOR CONSULTATION:  72 years old white female with iron deficiency.  HPI Courtney Crane is a 72 y.o. female.  Discussed the use of AI scribe software for clinical note transcription with the patient, who gave verbal consent to proceed.  History of Present Illness   The patient, a 72 year old with a history of asthma, rheumatoid arthritis, diverticulosis, diverticulitis, fatty liver, acid reflux, high cholesterol, high blood pressure, and vitamin D deficiency, presents with a chief complaint of fatigue and loss of appetite. Over the past four to five months, she has been experiencing persistent diarrhea, which has been investigated by her primary care doctor and a GI specialist. Despite undergoing an endoscopy, colonoscopy, and other tests, the cause of the diarrhea remains undetermined. However, it has been confirmed that there is no internal blood loss.  The patient has a history of anemia and has received iron infusions in the past, approximately five to six years ago. She has been noted to have low iron levels frequently, but due to a history of IBS with constipation, she has been managing this through dietary means rather than iron supplements. Recently, due to severe fatigue and significantly low iron levels, she was started on slow-release iron three times a week. This was increased to four times a week after a recent visit to her doctor showed persistently low red cell count. Despite this, her hemoglobin and hematocrit levels remain normal.  The patient also reports a recent adverse reaction to Flagyl, which resulted in severe muscle spasms. She was switched to Xifaxan as a result. She also reports occasional daytime sleepiness, leading to unintentional napping, and occasional nausea. Over the past year and a half, she has experienced unintentional  weight loss of over thirty pounds due to lack of appetite. She denies any dizziness, shortness of breath, bleeding, headaches, or changes in vision.      HPI  Past Medical History:  Diagnosis Date   Allergy    Anemia    Anxiety    Arthritis    Asthma    Cataract    Colon polyps    Complication of anesthesia    "sometimes I dont like to wake up after " ie difficult to awaken    Diverticulitis    Diverticulosis    Fatty liver    GERD (gastroesophageal reflux disease)    HLD (hyperlipidemia)    HTN (hypertension)    IBS (irritable bowel syndrome)    Insomnia    MRSA infection    Osteoarthritis    Osteopenia    PONV (postoperative nausea and vomiting)    RA (rheumatoid arthritis) (HCC)    Rheumatoid arthritis (HCC)    Seasonal allergies    Shingles    Status post dilation of esophageal narrowing    Vitamin D deficiency     Past Surgical History:  Procedure Laterality Date   ADENOIDECTOMY     APPENDECTOMY  1975   bladder tack  1990   CATARACT EXTRACTION, BILATERAL Bilateral    CHOLECYSTECTOMY N/A 01/31/2017   Procedure: LAPAROSCOPIC CHOLECYSTECTOMY WITH INTRAOPERATIVE CHOLANGIOGRAM;  Surgeon: Darnell Level, MD;  Location: [facility];  Service: General;  Laterality: N/A;   INCISION AND DRAINAGE PERIRECTAL ABSCESS N/A 12/02/2017   Procedure: IRRIGATION AND DEBRIDEMENT PERINEAL ABSCESS;  Surgeon: Gaynelle Adu, MD;  Location: [facility] OR;  Service: General;  Laterality: N/A;   LUMBAR FUSION  MENISCUS REPAIR Right 2013   REVERSE SHOULDER ARTHROPLASTY Right 12/06/2022   Procedure: RIGHT REVERSE SHOULDER ARTHROPLASTY;  Surgeon: Jones Broom, MD;  Location: [facility];  Service: Orthopedics;  Laterality: Right;   ROTATOR CUFF REPAIR Right 2005   TONSILLECTOMY     as a child   TOTAL ABDOMINAL HYSTERECTOMY  1988   TOTAL KNEE ARTHROPLASTY Left    WRIST RECONSTRUCTION Right 2008    Family History  Problem Relation Age of Onset   Colon polyps Mother    Diabetes Mother    Heart  disease Mother    Kidney cancer Mother    Arthritis Mother    Hypertension Mother    Hyperlipidemia Mother    Dementia Father    CAD Brother    Esophageal cancer Brother    Colon polyps Son    Colon cancer Neg Hx    Stomach cancer Neg Hx    Rectal cancer Neg Hx    Prostate cancer Neg Hx    Pancreatic cancer Neg Hx     Social History Social History   Tobacco Use   Smoking status: Never    Passive exposure: Never   Smokeless tobacco: Never  Vaping Use   Vaping status: Never Used  Substance Use Topics   Alcohol use: Yes    Alcohol/week: 7.0 standard drinks of alcohol    Types: 7 Standard drinks or equivalent per week    Comment: wine daily   Drug use: No    Comment: takes CBD oil    Allergies  Allergen Reactions   Meperidine Nausea Only   Methotrexate Other (See Comments)    Unknown reaction   Nickel Rash   Tape Rash and Other (See Comments)    Current Outpatient Medications  Medication Sig Dispense Refill   acetaminophen (TYLENOL) 500 MG tablet Take 1,000 mg by mouth every 6 (six) hours as needed (pain.).     albuterol (VENTOLIN HFA) 108 (90 Base) MCG/ACT inhaler Inhale 2 puffs into the lungs every 6 (six) hours as needed for wheezing or shortness of breath. 18 g 1   amLODipine (NORVASC) 10 MG tablet Take 10 mg by mouth in the morning.     ascorbic acid (VITAMIN C) 100 MG tablet Take by mouth.     benazepril-hydrochlorthiazide (LOTENSIN HCT) 10-12.5 MG tablet      bismuth subsalicylate (PEPTO BISMOL) 262 MG chewable tablet Chew 262-524 mg by mouth 3 (three) times daily as needed for diarrhea or loose stools or indigestion.     Budeson-Glycopyrrol-Formoterol (BREZTRI AEROSPHERE) 160-9-4.8 MCG/ACT AERO Inhale 2 puffs into the lungs in the morning and at bedtime. 10.7 g 5   Calcium Carb-Cholecalciferol (CALCIUM 600+D3 PO) Take 1 tablet by mouth in the morning.     calcium elemental as carbonate (TUMS ULTRA 1000) 400 MG chewable tablet Chew 1,000 mg by mouth 3 (three)  times daily as needed for heartburn.     CANNABIDIOL PO Take 1 capsule by mouth at bedtime. CBD     Cholecalciferol (VITAMIN D-3) 125 MCG (5000 UT) TABS Take 5,000 Units by mouth in the morning.     diphenoxylate-atropine (LOMOTIL) 2.5-0.025 MG tablet TAKE 1 TABLET BY MOUTH 4 (FOUR) TIMES DAILY AS NEEDED FOR DIARRHEA OR LOOSE STOOLS (AB PAIN). 30 tablet 0   Dupilumab (DUPIXENT) 300 MG/2ML SOAJ Inject 300 mg into the skin every 14 (fourteen) days. 12 mL 0   famotidine (PEPCID) 40 MG tablet TAKE 1 TABLET BY MOUTH EVERYDAY AT BEDTIME 90 tablet 3  Ferrous Sulfate ([employer] SLOW RELEASE IRON PO) Take 65 mg by mouth 3 (three) times a week.     fexofenadine (ALLEGRA) 180 MG tablet Take 0.5 tablets (90 mg total) by mouth in the morning. (Patient taking differently: Take 90 mg by mouth in the morning. Patient gets OTC, please do not send it to pharmacy.) 30 tablet 5   hydrochlorothiazide (HYDRODIURIL) 12.5 MG tablet Take 12.5 mg by mouth every morning.     hydrocortisone 2.5 % cream Apply 1 Application topically as needed.     inFLIXimab (REMICADE) 100 MG injection Inject into the vein every 6 (six) weeks.     ipratropium (ATROVENT) 0.06 % nasal spray Place 1 spray into both nostrils daily. 15 mL 5   leflunomide (ARAVA) 20 MG tablet Take 20 mg by mouth every evening.     losartan (COZAAR) 100 MG tablet Take 100 mg by mouth daily.     Magnesium 250 MG TABS Take 250 mg by mouth every evening.     MELATONIN PO Take 1.5 mg by mouth at bedtime as needed (sleep).     meloxicam (MOBIC) 15 MG tablet Take 15 mg by mouth as needed.     metroNIDAZOLE (FLAGYL) 250 MG tablet Take 1 tablet (250 mg total) by mouth 3 (three) times daily for 10 days. 30 tablet 0   mirabegron ER (MYRBETRIQ) 25 MG TB24 tablet Take 1 tablet (25 mg total) by mouth daily. 90 tablet 3   Multiple Vitamins-Minerals (IMMUNE SUPPORT VITAMIN C PO) Take 1 tablet by mouth in the morning. With Vitamin D 3/Vitamin A/Magnesium     Olopatadine HCl (PATADAY)  0.2 % SOLN Place 1 drop into both eyes daily as needed. (Patient taking differently: Place 1 drop into both eyes daily as needed. Patient gets OTC, please do not send it to pharmacy.) 2.5 mL 5   Omega-3 Fatty Acids (FISH OIL) 1200 MG CAPS Take 1,200 mg by mouth 2 (two) times daily.     omeprazole (PRILOSEC) 40 MG capsule TAKE 1 CAPSULE (40 MG TOTAL) BY MOUTH 2 (TWO) TIMES DAILY BEFORE A MEAL. 180 capsule 0   polyethylene glycol (MIRALAX / GLYCOLAX) 17 g packet Take 17 g by mouth every Monday, Wednesday, and Friday. In the morning     Psyllium (METAMUCIL 4 IN 1 FIBER PO) Take 2 capsules by mouth in the morning. Metamucil 3-in-1 Psyllium Fiber Supplement Capsules     rifaximin (XIFAXAN) 550 MG TABS tablet Take 1 tablet (550 mg total) by mouth 3 (three) times daily. Dx:IBS-D, K58.0 42 tablet 0   rosuvastatin (CRESTOR) 5 MG tablet Take 5 mg by mouth every evening.     simethicone (MYLICON) 125 MG chewable tablet Chew 125 mg by mouth every 6 (six) hours as needed for flatulence.     sodium chloride (OCEAN) 0.65 % SOLN nasal spray Place 1 spray into both nostrils in the morning and at bedtime.     temazepam (RESTORIL) 15 MG capsule Take 15 mg by mouth at bedtime as needed for sleep.     valACYclovir (VALTREX) 500 MG tablet Take 500 mg by mouth daily as needed (outbreaks).     No current facility-administered medications for this visit.    Review of Systems  Constitutional: positive for fatigue Eyes: negative Ears, nose, mouth, throat, and face: negative Respiratory: negative Cardiovascular: negative Gastrointestinal: negative Genitourinary:negative Integument/breast: negative Hematologic/lymphatic: negative Musculoskeletal:negative Neurological: negative Behavioral/Psych: negative Endocrine: negative Allergic/Immunologic: negative  Physical Exam  ZOX:WRUEA, healthy, no distress, well nourished, and well  developed SKIN: skin color, texture, turgor are normal, no rashes or significant  lesions HEAD: Normocephalic, No masses, lesions, tenderness or abnormalities EYES: normal, PERRLA, Conjunctiva are pink and non-injected EARS: External ears normal, Canals clear OROPHARYNX:no exudate, no erythema, and lips, buccal mucosa, and tongue normal  NECK: supple, no adenopathy, no JVD LYMPH:  no palpable lymphadenopathy, no hepatosplenomegaly BREAST:not examined LUNGS: clear to auscultation , and palpation HEART: regular rate & rhythm, no murmurs, and no gallops ABDOMEN:abdomen soft, non-tender, normal bowel sounds, and no masses or organomegaly BACK: Back symmetric, no curvature., No CVA tenderness EXTREMITIES:no joint deformities, effusion, or inflammation, no edema  NEURO: alert & oriented x 3 with fluent speech, no focal motor/sensory deficits  PERFORMANCE STATUS: ECOG 1  LABORATORY DATA: Lab Results  Component Value Date   WBC 4.4 09/13/2023   HGB 12.4 09/13/2023   HCT 38.8 09/13/2023   MCV 95.2 09/13/2023   PLT 180.0 09/13/2023      Chemistry      Component Value Date/Time   NA 136 12/04/2022 1422   K 3.6 12/04/2022 1422   CL 101 12/04/2022 1422   CO2 24 12/04/2022 1422   BUN 16 12/04/2022 1422   CREATININE 0.92 12/04/2022 1422      Component Value Date/Time   CALCIUM 9.3 12/04/2022 1422   ALKPHOS 49 06/28/2023 1433   AST 21 06/28/2023 1433   ALT 20 06/28/2023 1433   BILITOT 0.3 06/28/2023 1433       RADIOGRAPHIC STUDIES: No results found.  ASSESSMENT AND PLAN:    Iron Deficiency Iron deficiency without anemia. Reports fatigue, lack of energy, and poor appetite over the past 4-5 months. Recent blood tests show normal hemoglobin and hematocrit but low iron levels. History of IBS, diverticulosis, and previous iron infusions. Currently on slow-release iron supplements four times a week. Recent blood test still shows low iron levels. No evidence of internal bleeding from recent endoscopy and colonoscopy. Discussed the importance of taking iron with  vitamin C to enhance absorption. Awaiting results of additional tests including iron, ferritin, vitamin B12, serum folate, TSH, and copper levels. - Continue slow-release iron supplements four times a week - Take iron supplements with vitamin C to enhance absorption - Await results of ordered tests including iron, ferritin, vitamin B12, serum folate, TSH, and copper levels - Review lab results on MyChart and follow up if there are any concerns  Irritable Bowel Syndrome (IBS) IBS with constipation and recent diarrhea for the past 3-4 months. Managed by a gastroenterologist. Recent medication change from Flagyl to Xifaxan due to adverse reaction. Reports muscle spasms and charley horses possibly related to medication. - Continue current management with gastroenterologist - Monitor for any further adverse reactions to Xifaxan  Rheumatoid Arthritis (RA) RA with previous adverse reaction to methotrexate. - Continue current management for RA - Avoid methotrexate due to previous adverse reaction  Asthma Asthma exacerbated during allergy season. Uses a rescue inhaler as needed. No recent shortness of breath related to fatigue. 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 No history of smoking or drug abuse. Drinks a glass of red wine daily. Family history includes colon polyps, diabetes, heart disease, kidney cancer, dementia, and esophageal cancer. - Encourage continued use of MyChart for lab results and communication - Follow up with primary care physician and specialists as needed  Follow-up - Review lab results on MyChart - Contact if there  are any significant concerns from lab results - Schedule follow-up appointment if necessary.   The patient was advised to call immediately if she has any concerning symptoms in the interval. The patient voices understanding  of current disease status and treatment options and is in agreement with the current care plan.  All questions were answered. The patient knows to call the clinic with any problems, questions or concerns. We can certainly see the patient much sooner if necessary.  Thank you so much for allowing me to participate in the care of [facility]. I will continue to follow up the patient with you and assist in her care.  The total time spent in the appointment was 60 minutes.  Disclaimer: This note was dictated with voice recognition software. Similar sounding words can inadvertently be transcribed and may not be corrected upon review.   Lajuana Matte October 12, 2023, 11:03 AM

## 2023-10-14 LAB — CERULOPLASMIN: Ceruloplasmin: 23.8 mg/dL (ref 19.0–39.0)

## 2023-10-15 LAB — COPPER, SERUM: Copper: 99 ug/dL (ref 80–158)

## 2023-10-22 LAB — GENECONNECT MOLECULAR SCREEN: Genetic Analysis Overall Interpretation: NEGATIVE

## 2023-10-23 ENCOUNTER — Other Ambulatory Visit: Payer: Self-pay | Admitting: Physician Assistant

## 2023-10-24 ENCOUNTER — Encounter: Payer: Self-pay | Admitting: Allergy

## 2023-10-24 ENCOUNTER — Encounter: Payer: Self-pay | Admitting: Pulmonary Disease

## 2023-10-24 DIAGNOSIS — M0579 Rheumatoid arthritis with rheumatoid factor of multiple sites without organ or systems involvement: Secondary | ICD-10-CM | POA: Diagnosis not present

## 2023-10-24 DIAGNOSIS — Z79899 Other long term (current) drug therapy: Secondary | ICD-10-CM | POA: Diagnosis not present

## 2023-10-29 DIAGNOSIS — R3589 Other polyuria: Secondary | ICD-10-CM | POA: Diagnosis not present

## 2023-10-29 DIAGNOSIS — M791 Myalgia, unspecified site: Secondary | ICD-10-CM | POA: Diagnosis not present

## 2023-10-29 DIAGNOSIS — M6281 Muscle weakness (generalized): Secondary | ICD-10-CM | POA: Diagnosis not present

## 2023-10-29 DIAGNOSIS — M359 Systemic involvement of connective tissue, unspecified: Secondary | ICD-10-CM | POA: Diagnosis not present

## 2023-10-30 NOTE — Telephone Encounter (Signed)
Spoke with patient - she will stay on Dupixent and defer on enrolling into Medicare Payment Plan. She'd prefer to pay copays upfront and is confident she is going to hit the $2000 out-of-pocket max relatively quickly  Pharmacy team will follow-up with her in the latter half of Jan 2025 since she will need to change pharmacies and require updated PA  Courtney Crane, PharmD, MPH, BCPS, CPP Clinical Pharmacist (Rheumatology and Pulmonology)

## 2023-11-13 ENCOUNTER — Other Ambulatory Visit: Payer: Self-pay | Admitting: Allergy

## 2023-11-16 ENCOUNTER — Other Ambulatory Visit: Payer: Self-pay | Admitting: Obstetrics and Gynecology

## 2023-11-16 DIAGNOSIS — N3281 Overactive bladder: Secondary | ICD-10-CM

## 2023-11-21 NOTE — Telephone Encounter (Signed)
I'll look into this.

## 2023-11-22 NOTE — Telephone Encounter (Signed)
 Patient will not be using Myrbetriq medication instead of Gemtesa.

## 2023-11-22 NOTE — Telephone Encounter (Signed)
 Pharmacy is filling prescription for Myrbetriq medication for patient.

## 2023-11-27 DIAGNOSIS — M791 Myalgia, unspecified site: Secondary | ICD-10-CM | POA: Diagnosis not present

## 2023-11-27 DIAGNOSIS — F330 Major depressive disorder, recurrent, mild: Secondary | ICD-10-CM | POA: Diagnosis not present

## 2023-11-27 DIAGNOSIS — D509 Iron deficiency anemia, unspecified: Secondary | ICD-10-CM | POA: Diagnosis not present

## 2023-11-27 DIAGNOSIS — R5383 Other fatigue: Secondary | ICD-10-CM | POA: Diagnosis not present

## 2023-11-27 DIAGNOSIS — J45909 Unspecified asthma, uncomplicated: Secondary | ICD-10-CM | POA: Diagnosis not present

## 2023-11-27 DIAGNOSIS — M069 Rheumatoid arthritis, unspecified: Secondary | ICD-10-CM | POA: Diagnosis not present

## 2023-11-27 DIAGNOSIS — E785 Hyperlipidemia, unspecified: Secondary | ICD-10-CM | POA: Diagnosis not present

## 2023-11-27 DIAGNOSIS — D84821 Immunodeficiency due to drugs: Secondary | ICD-10-CM | POA: Diagnosis not present

## 2023-11-27 DIAGNOSIS — E041 Nontoxic single thyroid nodule: Secondary | ICD-10-CM | POA: Diagnosis not present

## 2023-11-27 DIAGNOSIS — G729 Myopathy, unspecified: Secondary | ICD-10-CM | POA: Diagnosis not present

## 2023-11-27 DIAGNOSIS — E1169 Type 2 diabetes mellitus with other specified complication: Secondary | ICD-10-CM | POA: Diagnosis not present

## 2023-11-28 DIAGNOSIS — M0579 Rheumatoid arthritis with rheumatoid factor of multiple sites without organ or systems involvement: Secondary | ICD-10-CM | POA: Diagnosis not present

## 2023-12-02 ENCOUNTER — Other Ambulatory Visit: Payer: Self-pay | Admitting: Physician Assistant

## 2023-12-02 ENCOUNTER — Telehealth: Payer: Self-pay | Admitting: Pharmacist

## 2023-12-02 DIAGNOSIS — J4551 Severe persistent asthma with (acute) exacerbation: Secondary | ICD-10-CM

## 2023-12-02 NOTE — Telephone Encounter (Signed)
 Patient no longer qualifies for PAP for Dupixent  Per my conversation with her on 12/11/20245 she'd pay copay through insurance  Submitted a Prior Authorization request to [facility] ADVANTAGE/RX ADVANCE for DUPIXENT  via CoverMyMeds. Will update once we receive a response.  Key: [id number]   Of note, patient will need to be seen in office for any refills of Dupixent  to be sent. Last seen in Oct 2023.  Sherry Pennant, PharmD, MPH, BCPS, CPP Clinical Pharmacist (Rheumatology and Pulmonology)

## 2023-12-04 ENCOUNTER — Ambulatory Visit: Payer: PPO | Admitting: Pulmonary Disease

## 2023-12-06 ENCOUNTER — Ambulatory Visit: Payer: PPO | Admitting: Acute Care

## 2023-12-06 ENCOUNTER — Telehealth: Payer: Self-pay | Admitting: Acute Care

## 2023-12-06 ENCOUNTER — Encounter: Payer: Self-pay | Admitting: Acute Care

## 2023-12-06 VITALS — BP 138/70 | HR 77 | Temp 98.2°F | Ht 62.0 in | Wt 130.8 lb

## 2023-12-06 DIAGNOSIS — D84821 Immunodeficiency due to drugs: Secondary | ICD-10-CM | POA: Diagnosis not present

## 2023-12-06 DIAGNOSIS — Z79899 Other long term (current) drug therapy: Secondary | ICD-10-CM

## 2023-12-06 DIAGNOSIS — Z9289 Personal history of other medical treatment: Secondary | ICD-10-CM | POA: Diagnosis not present

## 2023-12-06 DIAGNOSIS — J069 Acute upper respiratory infection, unspecified: Secondary | ICD-10-CM

## 2023-12-06 DIAGNOSIS — M05742 Rheumatoid arthritis with rheumatoid factor of left hand without organ or systems involvement: Secondary | ICD-10-CM | POA: Diagnosis not present

## 2023-12-06 DIAGNOSIS — J45998 Other asthma: Secondary | ICD-10-CM

## 2023-12-06 DIAGNOSIS — M05741 Rheumatoid arthritis with rheumatoid factor of right hand without organ or systems involvement: Secondary | ICD-10-CM | POA: Diagnosis not present

## 2023-12-06 MED ORDER — DUPIXENT 300 MG/2ML ~~LOC~~ SOAJ
300.0000 mg | SUBCUTANEOUS | 2 refills | Status: DC
  Filled 2023-12-11: qty 4, 28d supply, fill #0
  Filled 2024-01-02: qty 4, 28d supply, fill #1
  Filled 2024-02-11: qty 4, 28d supply, fill #2
  Filled 2024-03-18: qty 4, 28d supply, fill #3
  Filled 2024-04-14: qty 4, 28d supply, fill #4
  Filled 2024-05-12: qty 4, 28d supply, fill #5
  Filled 2024-06-08: qty 4, 28d supply, fill #6
  Filled 2024-06-30: qty 4, 28d supply, fill #7
  Filled 2024-07-22: qty 4, 28d supply, fill #8

## 2023-12-06 MED ORDER — DOXYCYCLINE HYCLATE 100 MG PO TABS: 100.0000 mg | ORAL_TABLET | Freq: Two times a day (BID) | ORAL | 0 refills | Status: DC

## 2023-12-06 NOTE — Telephone Encounter (Signed)
Received notification from [facility] ADVANTAGE/RX ADVANCE regarding a prior authorization for DUPIXENT. Authorization has been APPROVED from 12/02/2023 to 12/01/2024. Approval letter sent to scan center.  Patient can fill through [facility]: [phone]   Authorization # [id number]  Enrolled patient into asthma grant through PAF: Award Period: 06/09/2023 - 12/05/2024 Cardholder: [id number] BIN: [id number] PCN: [id number] Group: [id number] For pharmacy inquiries, contact PDMI at [phone]  ATC patient to determine what date she'll need Dupixent to home. Left VM with my callback number.  Rx sent to [facility] in preparation for onboarding  Chesley Mires, PharmD, MPH, BCPS, CPP Clinical Pharmacist (Rheumatology and Pulmonology)

## 2023-12-06 NOTE — Telephone Encounter (Signed)
Pt. Has had her Dupixent approved, but does not know when she will get mailed medication. She has one injection left. Can you please follow up so we can ensure she does not miss a dose? Thanks

## 2023-12-06 NOTE — Progress Notes (Unsigned)
History of Present Illness Courtney Crane is a 73 y.o. female with rheumatoid arthritis ( on nfliximab infusion), normal PFTs  with baseline shortness of breath related to persistent asthma symptoms. She is now on Dupixent. She is followed by Dr. Tonia Brooms. Will need reassigning to a pulmonologist. Maintenance Inhaler>> Breztri Maintenance Biologic>> Dupixen Rescue>> Albuterol as needed   Synopsis 73 year old female on infliximab infusion for rheumatoid arthritis, HRCT with no evidence of ILD, normal PFTs baseline shortness of breath related to persistent asthma symptoms positive RAST panel and IgE elevated. Currently on triple therapy inhaler regimen and as well as Dupixent, which has dramatically improved her symptomology. Maintenance Inhaler>> Breztri Maintenance Biologic>> Dupixen Rescue>> Albuterol as needed  12/06/2023 Pt. Presents for follow up of her persistent asthma.She is on a regimen of Dupixent, and she is doing well, but states she has had a cough. She states she can tell about 4 days before her next dose her cough does get worse, and she can become more short of breath, it is manageable. Nothing like her pre Dupixent status.  She has had her medication renewed, she needs to call pharmacy to see how she is going to get her medication. She has developed a persistent cough with thick green mucus. She states this has been an issue for about a year.  Pt. Allergist has asked the patient to wean down to 1 puff of her Breztri. She is unsure if this is why she has had an increased cough. We discussed increasing back to 2 puffs twice daily for 1 month to see if cough is better. If no better, decrease back to 1 puff twice daily. If better, maintain 2 puffs daily.   Test Results: CT Chest October 2022: No PE, there is some subtle parenchymal changes, possibly related to atelectasis but there is an obvious difference there on her CT imaging.    HRCT chest 01/26/2022: No evidence of  interstitial lung disease, lung nodules stable, thyroid nodule.    Pulmonary Functions Testing Results:     Latest Ref Rng & Units 01/24/2022    3:54 PM  PFT Results  FVC-Pre L 2.64   FVC-Predicted Pre % 91   FVC-Post L 2.74   FVC-Predicted Post % 95   Pre FEV1/FVC % % 72   Post FEV1/FCV % % 77   FEV1-Pre L 1.90   FEV1-Predicted Pre % 87   FEV1-Post L 2.10   DLCO uncorrected ml/min/mmHg 18.05   DLCO UNC% % 95   DLCO corrected ml/min/mmHg 18.34   DLCO COR %Predicted % 97   DLVA Predicted % 99   TLC L 4.75   TLC % Predicted % 96   RV % Predicted % 104         Latest Ref Rng & Units 10/12/2023   11:05 AM 09/13/2023   11:56 AM 12/04/2022    2:22 PM  CBC  WBC 4.0 - 10.5 K/uL 7.9  4.4  6.2   Hemoglobin 12.0 - 15.0 g/dL 40.3  47.4  25.9   Hematocrit 36.0 - 46.0 % 37.2  38.8  39.9   Platelets 150 - 400 K/uL 150  180.0  166        Latest Ref Rng & Units 10/12/2023   11:05 AM 12/04/2022    2:22 PM 11/27/2021   11:24 AM  BMP  Glucose 70 - 99 mg/dL 563  875  98   BUN 8 - 23 mg/dL 19  16  23    Creatinine 0.44 -  1.00 mg/dL 1.61  0.96  0.45   Sodium 135 - 145 mmol/L 137  136  136   Potassium 3.5 - 5.1 mmol/L 3.7  3.6  4.1   Chloride 98 - 111 mmol/L 102  101  100   CO2 22 - 32 mmol/L 27  24  28    Calcium 8.9 - 10.3 mg/dL 9.7  9.3  9.7     BNP    Component Value Date/Time   BNP 19.7 08/31/2021 1525    ProBNP No results found for: "PROBNP"  PFT    Component Value Date/Time   FEV1PRE 1.90 01/24/2022 1554   FEV1POST 2.10 01/24/2022 1554   FVCPRE 2.64 01/24/2022 1554   FVCPOST 2.74 01/24/2022 1554   TLC 4.75 01/24/2022 1554   DLCOUNC 18.05 01/24/2022 1554   PREFEV1FVCRT 72 01/24/2022 1554   PSTFEV1FVCRT 77 01/24/2022 1554    No results found.   Past medical hx Past Medical History:  Diagnosis Date   Allergy    Anemia    Anxiety    Arthritis    Asthma    Cataract    Colon polyps    Complication of anesthesia    "sometimes I dont like to wake up after " ie  difficult to awaken    Diverticulitis    Diverticulosis    Fatty liver    GERD (gastroesophageal reflux disease)    HLD (hyperlipidemia)    HTN (hypertension)    IBS (irritable bowel syndrome)    Insomnia    MRSA infection    Osteoarthritis    Osteopenia    PONV (postoperative nausea and vomiting)    RA (rheumatoid arthritis) (HCC)    Rheumatoid arthritis (HCC)    Seasonal allergies    Shingles    Status post dilation of esophageal narrowing    Vitamin D deficiency      Social History   Tobacco Use   Smoking status: Never    Passive exposure: Never   Smokeless tobacco: Never  Vaping Use   Vaping status: Never Used  Substance Use Topics   Alcohol use: Yes    Alcohol/week: 7.0 standard drinks of alcohol    Types: 7 Standard drinks or equivalent per week    Comment: wine daily   Drug use: No    Comment: takes CBD oil    Ms.Dewalt reports that she has never smoked. She has never been exposed to tobacco smoke. She has never used smokeless tobacco. She reports current alcohol use of about 7.0 standard drinks of alcohol per week. She reports that she does not use drugs.  Tobacco Cessation: Never smoker    Past surgical hx, Family hx, Social hx all reviewed.  Current Outpatient Medications on File Prior to Visit  Medication Sig   acetaminophen (TYLENOL) 500 MG tablet Take 1,000 mg by mouth every 6 (six) hours as needed (pain.).   albuterol (VENTOLIN HFA) 108 (90 Base) MCG/ACT inhaler TAKE 2 PUFFS BY MOUTH EVERY 6 HOURS AS NEEDED FOR WHEEZE OR SHORTNESS OF BREATH   amLODipine (NORVASC) 10 MG tablet Take 10 mg by mouth in the morning.   ascorbic acid (VITAMIN C) 100 MG tablet Take by mouth.   benazepril-hydrochlorthiazide (LOTENSIN HCT) 10-12.5 MG tablet    bismuth subsalicylate (PEPTO BISMOL) 262 MG chewable tablet Chew 262-524 mg by mouth 3 (three) times daily as needed for diarrhea or loose stools or indigestion.   Budeson-Glycopyrrol-Formoterol (BREZTRI AEROSPHERE)  160-9-4.8 MCG/ACT AERO Inhale 2 puffs into the lungs in the  morning and at bedtime.   Calcium Carb-Cholecalciferol (CALCIUM 600+D3 PO) Take 1 tablet by mouth in the morning.   calcium elemental as carbonate (TUMS ULTRA 1000) 400 MG chewable tablet Chew 1,000 mg by mouth 3 (three) times daily as needed for heartburn.   CANNABIDIOL PO Take 1 capsule by mouth at bedtime. CBD   Cholecalciferol (VITAMIN D-3) 125 MCG (5000 UT) TABS Take 5,000 Units by mouth in the morning.   diphenoxylate-atropine (LOMOTIL) 2.5-0.025 MG tablet TAKE 1 TABLET BY MOUTH 4 (FOUR) TIMES DAILY AS NEEDED FOR DIARRHEA OR LOOSE STOOLS (AB PAIN). (Patient not taking: Reported on 10/12/2023)   Dupilumab (DUPIXENT) 300 MG/2ML SOAJ Inject 300 mg into the skin every 14 (fourteen) days.   famotidine (PEPCID) 40 MG tablet TAKE 1 TABLET BY MOUTH EVERYDAY AT BEDTIME   Ferrous Sulfate ([employer] SLOW RELEASE IRON PO) Take 65 mg by mouth 3 (three) times a week.   fexofenadine (ALLEGRA) 180 MG tablet Take 0.5 tablets (90 mg total) by mouth in the morning. (Patient taking differently: Take 90 mg by mouth in the morning. Patient gets OTC, please do not send it to pharmacy.)   hydrochlorothiazide (HYDRODIURIL) 12.5 MG tablet Take 12.5 mg by mouth every morning.   hydrocortisone 2.5 % cream Apply 1 Application topically as needed.   inFLIXimab (REMICADE) 100 MG injection Inject into the vein every 6 (six) weeks.   ipratropium (ATROVENT) 0.06 % nasal spray Place 1 spray into both nostrils daily.   leflunomide (ARAVA) 20 MG tablet Take 20 mg by mouth every evening.   losartan (COZAAR) 100 MG tablet Take 100 mg by mouth daily.   Magnesium 250 MG TABS Take 250 mg by mouth every evening.   MELATONIN PO Take 1.5 mg by mouth at bedtime as needed (sleep).   meloxicam (MOBIC) 15 MG tablet Take 15 mg by mouth as needed.   mirabegron ER (MYRBETRIQ) 25 MG TB24 tablet Take 1 tablet (25 mg total) by mouth daily.   Multiple Vitamins-Minerals (IMMUNE SUPPORT VITAMIN  C PO) Take 1 tablet by mouth in the morning. With Vitamin D 3/Vitamin A/Magnesium   Olopatadine HCl (PATADAY) 0.2 % SOLN Place 1 drop into both eyes daily as needed. (Patient taking differently: Place 1 drop into both eyes daily as needed. Patient gets OTC, please do not send it to pharmacy.)   Omega-3 Fatty Acids (FISH OIL) 1200 MG CAPS Take 1,200 mg by mouth 2 (two) times daily.   omeprazole (PRILOSEC) 40 MG capsule TAKE 1 CAPSULE (40 MG TOTAL) BY MOUTH 2 (TWO) TIMES DAILY BEFORE A MEAL.   polyethylene glycol (MIRALAX / GLYCOLAX) 17 g packet Take 17 g by mouth every Monday, Wednesday, and Friday. In the morning   Psyllium (METAMUCIL 4 IN 1 FIBER PO) Take 2 capsules by mouth in the morning. Metamucil 3-in-1 Psyllium Fiber Supplement Capsules   rifaximin (XIFAXAN) 550 MG TABS tablet Take 1 tablet (550 mg total) by mouth 3 (three) times daily. Dx:IBS-D, K58.0   rosuvastatin (CRESTOR) 5 MG tablet Take 5 mg by mouth every evening.   simethicone (MYLICON) 125 MG chewable tablet Chew 125 mg by mouth every 6 (six) hours as needed for flatulence.   sodium chloride (OCEAN) 0.65 % SOLN nasal spray Place 1 spray into both nostrils in the morning and at bedtime.   temazepam (RESTORIL) 15 MG capsule Take 15 mg by mouth at bedtime as needed for sleep.   valACYclovir (VALTREX) 500 MG tablet Take 500 mg by mouth daily as needed (outbreaks).  No current facility-administered medications on file prior to visit.     Allergies  Allergen Reactions   Meperidine Nausea Only   Methotrexate Other (See Comments)    Unknown reaction   Nickel Rash   Tape Rash and Other (See Comments)    Review Of Systems:  Constitutional:   No  weight loss, night sweats,  Fevers, chills, fatigue, or  lassitude.  HEENT:   No headaches,  Difficulty swallowing,  Tooth/dental problems, or  Sore throat,                No sneezing, itching, ear ache, nasal congestion, post nasal drip,   CV:  No chest pain,  Orthopnea, PND, swelling  in lower extremities, anasarca, dizziness, palpitations, syncope.   GI  No heartburn, indigestion, abdominal pain, nausea, vomiting, diarrhea, change in bowel habits, loss of appetite, bloody stools.   Resp: No shortness of breath with exertion or at rest.  No excess mucus, no productive cough,  No non-productive cough,  No coughing up of blood.  No change in color of mucus.  No wheezing.  No chest wall deformity  Skin: no rash or lesions.  GU: no dysuria, change in color of urine, no urgency or frequency.  No flank pain, no hematuria   MS:  No joint pain or swelling.  No decreased range of motion.  No back pain.  Psych:  No change in mood or affect. No depression or anxiety.  No memory loss.   Vital Signs There were no vitals taken for this visit.   Physical Exam:  General- No distress,  A&Ox3 ENT: No sinus tenderness, TM clear, pale nasal mucosa, no oral exudate,no post nasal drip, no LAN Cardiac: S1, S2, regular rate and rhythm, no murmur Chest: No wheeze/ rales/ dullness; no accessory muscle use, no nasal flaring, no sternal retractions Abd.: Soft Non-tender Ext: No clubbing cyanosis, edema Neuro:  normal strength Skin: No rashes, warm and dry Psych: normal mood and behavior   Assessment/Plan  Asthma Plan Continue Dupixent injections as she has seen dramatic improvement in symptomatology Continue Breztri daily, rinse mouth after use. Continue albuterol as needed. Follow-up with [place] in 1 year or as needed. We will assign you to a new pulmonary doctor at your next annual follow up.     Bevelyn Ngo, NP 12/06/2023  10:58 AM

## 2023-12-06 NOTE — Patient Instructions (Addendum)
It is good to see you today. I am glad you are doing well. Follow up with pharmacy about delivery of Dupixent. Call if you do not get any response. I have prescribed Doxycycline 100 mg , twice daily x one week. Take probiotic with antibiotic, and for 1 week beyond. Start Mucinex , 1200 mg take with a full glass of water, to thin your secretions . We will add a flutter valve to help mobilize thick secretions. Use several times daily, 4 puffs at a time.  Follow up with Dr. Celine Mans in 1 month. If secretions remain discolored after antibiotic treatment we will consider culturing.  This needs to be a 30 minute visit to establish with new MD.  Call if you need [place] sooner.  Please contact office for sooner follow up if symptoms do not improve or worsen or seek emergency care    .

## 2023-12-07 ENCOUNTER — Encounter: Payer: Self-pay | Admitting: Acute Care

## 2023-12-09 NOTE — Telephone Encounter (Signed)
Spoke with patient - she is due to take next dose on 12/18/2023 (for which she has a dose). She will need Dupixent to her home by 01/01/2024. Note left in [facility]  Chesley Mires, PharmD, MPH, BCPS, CPP Clinical Pharmacist (Rheumatology and Pulmonology)

## 2023-12-11 ENCOUNTER — Other Ambulatory Visit: Payer: Self-pay

## 2023-12-11 ENCOUNTER — Other Ambulatory Visit (HOSPITAL_COMMUNITY): Payer: Self-pay

## 2023-12-11 NOTE — Telephone Encounter (Signed)
Pt was approved through insurance, however her copay for one month was still around $1,200. Devki successfully enrolled pt into Asthma grant to cover copay.   Pt contacted and onboarded to specialty pharmacy program, she will receive her next shipment on 12/17/23.Nothing further needed at this time.

## 2023-12-11 NOTE — Progress Notes (Signed)
Specialty Pharmacy Initial Fill Coordination Note  Buffey Thurlow is a 73 y.o. female contacted today regarding initial fill of specialty medication(s) Dupilumab (Dupixent)   Patient requested Delivery   Delivery date: 12/17/23   Verified address: [address]   [place] [facility] [id number]   Medication will be filled on 12/16/23.   Patient has grant on file and is aware of $0 copayment.

## 2023-12-13 NOTE — Progress Notes (Signed)
Patient no longer eligible for PAP through [employer] for Dupixent. Enrolled into grant for asthma  Continue 300mg  SQ every 14 days  Chesley Mires, PharmD, MPH, BCPS, CPP Clinical Pharmacist (Rheumatology and Pulmonology)

## 2023-12-16 ENCOUNTER — Other Ambulatory Visit: Payer: Self-pay

## 2023-12-17 ENCOUNTER — Other Ambulatory Visit (HOSPITAL_COMMUNITY): Payer: Self-pay

## 2023-12-17 ENCOUNTER — Other Ambulatory Visit: Payer: Self-pay

## 2023-12-17 DIAGNOSIS — M48062 Spinal stenosis, lumbar region with neurogenic claudication: Secondary | ICD-10-CM | POA: Diagnosis not present

## 2023-12-20 DIAGNOSIS — M25511 Pain in right shoulder: Secondary | ICD-10-CM | POA: Diagnosis not present

## 2023-12-24 ENCOUNTER — Ambulatory Visit
Admission: RE | Admit: 2023-12-24 | Discharge: 2023-12-24 | Disposition: A | Discharge status: Home / Self Care | Payer: PPO | Source: Ambulatory Visit | Attending: Family Medicine | Admitting: Family Medicine

## 2023-12-24 VITALS — BP 113/68 | HR 100 | Temp 98.1°F | Resp 18

## 2023-12-24 DIAGNOSIS — J069 Acute upper respiratory infection, unspecified: Secondary | ICD-10-CM

## 2023-12-24 LAB — POC COVID19/FLU A&B COMBO
Covid Antigen, POC: NEGATIVE
Influenza A Antigen, POC: NEGATIVE
Influenza B Antigen, POC: NEGATIVE

## 2023-12-24 MED ORDER — HYDROCODONE BIT-HOMATROP MBR 5-1.5 MG/5ML PO SOLN: 5.0000 mL | Freq: Four times a day (QID) | ORAL | 0 refills | Status: DC | PRN

## 2023-12-24 MED ORDER — PROMETHAZINE-DM 6.25-15 MG/5ML PO SYRP: 5.0000 mL | ORAL_SOLUTION | Freq: Four times a day (QID) | ORAL | 0 refills | Status: DC | PRN

## 2023-12-24 NOTE — ED Provider Notes (Signed)
 [facility]   [phone] 12/24/23 Arrival Time: 1548  ASSESSMENT & PLAN:  1. Viral URI with cough    Discussed typical duration of likely viral illness. Results for orders placed or performed during the hospital encounter of 12/24/23  POC Covid19/Flu A&B Antigen   Collection Time: 12/24/23  5:06 PM  Result Value Ref Range   Influenza A Antigen, POC Negative Negative   Influenza B Antigen, POC Negative Negative   Covid Antigen, POC Negative Negative   OTC symptom care as needed.  Meds ordered this encounter  Medications   HYDROcodone  bit-homatropine (HYCODAN) 5-1.5 MG/5ML syrup    Sig: Take 5 mLs by mouth every 6 (six) hours as needed for cough.    Dispense:  90 mL    Refill:  0     Follow-up Information     [name], MD.   Specialty: Internal Medicine Why: As needed. Contact information: [address] [place] [address] [phone]                 Reviewed expectations re: course of current medical issues. Questions answered. Outlined signs and symptoms indicating need for more acute intervention. Understanding verbalized. After Visit Summary given.   SUBJECTIVE: History from: Patient. Courtney Crane is a 73 y.o. female. Pt reports she has a bad cough, sore throat, no appetite, head congestion, and runny nose x 3 days . Husband sick with same; improving. Denies: difficulty breathing. Normal PO intake without n/v/d.  OBJECTIVE:  Vitals:   12/24/23 1644  BP: 113/68  Pulse: 100  Resp: 18  Temp: 98.1 F (36.7 C)  TempSrc: Oral  SpO2: 96%    General appearance: alert; no distress Eyes: PERRLA; EOMI; conjunctiva normal HENT: [place]; AT; with nasal congestion Neck: supple  Lungs: speaks full sentences without difficulty; unlabored; significant cough; no wheezing Extremities: no edema Skin: warm and dry Neurologic: normal gait Psychological: alert and cooperative; normal mood and affect  Labs: Results for orders placed or  performed during the hospital encounter of 12/24/23  POC Covid19/Flu A&B Antigen   Collection Time: 12/24/23  5:06 PM  Result Value Ref Range   Influenza A Antigen, POC Negative Negative   Influenza B Antigen, POC Negative Negative   Covid Antigen, POC Negative Negative   Labs Reviewed  POC COVID19/FLU A&B COMBO    Imaging: No results found.  Allergies  Allergen Reactions   Meperidine Nausea Only   Methotrexate Other (See Comments)    Unknown reaction   Nickel Rash   Tape Rash and Other (See Comments)    Past Medical History:  Diagnosis Date   Allergy    Anemia    Anxiety    Arthritis    Asthma    Cataract    Colon polyps    Complication of anesthesia    sometimes I dont like to wake up after  ie difficult to awaken    Diverticulitis    Diverticulosis    Fatty liver    GERD (gastroesophageal reflux disease)    HLD (hyperlipidemia)    HTN (hypertension)    IBS (irritable bowel syndrome)    Insomnia    MRSA infection    Osteoarthritis    Osteopenia    PONV (postoperative nausea and vomiting)    RA (rheumatoid arthritis) (HCC)    Rheumatoid arthritis (HCC)    Seasonal allergies    Shingles    Status post dilation of esophageal narrowing    Vitamin D  deficiency    Social  History   Socioeconomic History   Marital status: Married    Spouse name: Not on file   Number of children: 2   Years of education: Not on file   Highest education level: Not on file  Occupational History   Occupation: retired  Tobacco Use   Smoking status: Never    Passive exposure: Never   Smokeless tobacco: Never  Vaping Use   Vaping status: Never Used  Substance and Sexual Activity   Alcohol  use: Yes    Alcohol /week: 7.0 standard drinks of alcohol     Types: 7 Standard drinks or equivalent per week    Comment: wine daily   Drug use: No    Comment: takes CBD oil   Sexual activity: Not Currently    Partners: Male    Birth control/protection: Post-menopausal    Comment: Hx  HSV, High RISK Medicare  Other Topics Concern   Not on file  Social History Narrative   Retired from airline pilot   Lives at home with husband ; one level home   Right handed   Highest level of education:  Some college   Social Drivers of Corporate Investment Banker Strain: Not on file  Food Insecurity: Not on file  Transportation Needs: Not on file  Physical Activity: Not on file  Stress: Not on file  Social Connections: Not on file  Intimate Partner Violence: Not on file   Family History  Problem Relation Age of Onset   Colon polyps Mother    Diabetes Mother    Heart disease Mother    Kidney cancer Mother    Arthritis Mother    Hypertension Mother    Hyperlipidemia Mother    Dementia Father    CAD Brother    Esophageal cancer Brother    Colon polyps Son    Colon cancer Neg Hx    Stomach cancer Neg Hx    Rectal cancer Neg Hx    Prostate cancer Neg Hx    Pancreatic cancer Neg Hx    Past Surgical History:  Procedure Laterality Date   ADENOIDECTOMY     APPENDECTOMY  1975   bladder tack  1990   CATARACT EXTRACTION, BILATERAL Bilateral    CHOLECYSTECTOMY N/A 01/31/2017   Procedure: LAPAROSCOPIC CHOLECYSTECTOMY WITH INTRAOPERATIVE CHOLANGIOGRAM;  Surgeon: Krystal Spinner, MD;  Location: [facility];  Service: General;  Laterality: N/A;   INCISION AND DRAINAGE PERIRECTAL ABSCESS N/A 12/02/2017   Procedure: IRRIGATION AND DEBRIDEMENT PERINEAL ABSCESS;  Surgeon: Tanda Locus, MD;  Location: [facility] OR;  Service: General;  Laterality: N/A;   LUMBAR FUSION     MENISCUS REPAIR Right 2013   REVERSE SHOULDER ARTHROPLASTY Right 12/06/2022   Procedure: RIGHT REVERSE SHOULDER ARTHROPLASTY;  Surgeon: Dozier Soulier, MD;  Location: [facility];  Service: Orthopedics;  Laterality: Right;   ROTATOR CUFF REPAIR Right 2005   TONSILLECTOMY     as a child   TOTAL ABDOMINAL HYSTERECTOMY  1988   TOTAL KNEE ARTHROPLASTY Left    WRIST RECONSTRUCTION Right 2008     Rolinda Rogue, MD 12/24/23 1730

## 2023-12-24 NOTE — ED Triage Notes (Signed)
Pt reports she has a bad cough, sore throat, no appetite, head congestion, and runny nose x 3 days

## 2023-12-26 DIAGNOSIS — J45909 Unspecified asthma, uncomplicated: Secondary | ICD-10-CM | POA: Diagnosis not present

## 2023-12-26 DIAGNOSIS — J0100 Acute maxillary sinusitis, unspecified: Secondary | ICD-10-CM | POA: Diagnosis not present

## 2023-12-26 DIAGNOSIS — R058 Other specified cough: Secondary | ICD-10-CM | POA: Diagnosis not present

## 2023-12-26 DIAGNOSIS — G43909 Migraine, unspecified, not intractable, without status migrainosus: Secondary | ICD-10-CM | POA: Diagnosis not present

## 2023-12-26 DIAGNOSIS — Z1152 Encounter for screening for COVID-19: Secondary | ICD-10-CM | POA: Diagnosis not present

## 2023-12-26 DIAGNOSIS — M069 Rheumatoid arthritis, unspecified: Secondary | ICD-10-CM | POA: Diagnosis not present

## 2023-12-26 DIAGNOSIS — H6502 Acute serous otitis media, left ear: Secondary | ICD-10-CM | POA: Diagnosis not present

## 2023-12-26 DIAGNOSIS — R0981 Nasal congestion: Secondary | ICD-10-CM | POA: Diagnosis not present

## 2024-01-01 ENCOUNTER — Other Ambulatory Visit (HOSPITAL_COMMUNITY): Payer: Self-pay

## 2024-01-02 ENCOUNTER — Telehealth: Payer: Self-pay | Admitting: Internal Medicine

## 2024-01-02 ENCOUNTER — Other Ambulatory Visit: Payer: Self-pay

## 2024-01-02 ENCOUNTER — Other Ambulatory Visit (HOSPITAL_COMMUNITY): Payer: Self-pay

## 2024-01-02 NOTE — Telephone Encounter (Signed)
Rescheduled appointments per patient leaving a voicemail stating she still isn't feeling well. Left the patient a voicemail with the rescheduled date and times. Patient will be mailed an appointment reminder.

## 2024-01-02 NOTE — Telephone Encounter (Signed)
Rescheduled appointments per patient MyChart message. Left the patient a voicemail with the rescheduled dates.

## 2024-01-02 NOTE — Progress Notes (Signed)
Specialty Pharmacy Refill Coordination Note  Courtney Crane is a 73 y.o. female contacted today regarding refills of specialty medication(s) Dupilumab (Dupixent)   Patient requested Delivery   Delivery date: 01/15/24 (Patient will be out of town the first week of March)   Verified address: [address]   [place] [id number]   Medication will be filled on 01/14/24.

## 2024-01-06 ENCOUNTER — Inpatient Hospital Stay: Payer: PPO

## 2024-01-06 ENCOUNTER — Inpatient Hospital Stay: Payer: PPO | Admitting: Internal Medicine

## 2024-01-06 DIAGNOSIS — J069 Acute upper respiratory infection, unspecified: Secondary | ICD-10-CM | POA: Diagnosis not present

## 2024-01-06 DIAGNOSIS — R058 Other specified cough: Secondary | ICD-10-CM | POA: Diagnosis not present

## 2024-01-06 DIAGNOSIS — M069 Rheumatoid arthritis, unspecified: Secondary | ICD-10-CM | POA: Diagnosis not present

## 2024-01-06 DIAGNOSIS — J45909 Unspecified asthma, uncomplicated: Secondary | ICD-10-CM | POA: Diagnosis not present

## 2024-01-06 DIAGNOSIS — H6502 Acute serous otitis media, left ear: Secondary | ICD-10-CM | POA: Diagnosis not present

## 2024-01-06 DIAGNOSIS — J0100 Acute maxillary sinusitis, unspecified: Secondary | ICD-10-CM | POA: Diagnosis not present

## 2024-01-06 DIAGNOSIS — B370 Candidal stomatitis: Secondary | ICD-10-CM | POA: Diagnosis not present

## 2024-01-07 ENCOUNTER — Ambulatory Visit: Payer: PPO | Admitting: Internal Medicine

## 2024-01-09 ENCOUNTER — Ambulatory Visit: Payer: PPO | Admitting: Allergy

## 2024-01-13 DIAGNOSIS — H26493 Other secondary cataract, bilateral: Secondary | ICD-10-CM | POA: Diagnosis not present

## 2024-01-13 DIAGNOSIS — Z961 Presence of intraocular lens: Secondary | ICD-10-CM | POA: Diagnosis not present

## 2024-01-14 DIAGNOSIS — M0579 Rheumatoid arthritis with rheumatoid factor of multiple sites without organ or systems involvement: Secondary | ICD-10-CM | POA: Diagnosis not present

## 2024-01-14 DIAGNOSIS — Z79899 Other long term (current) drug therapy: Secondary | ICD-10-CM | POA: Diagnosis not present

## 2024-01-14 DIAGNOSIS — R5383 Other fatigue: Secondary | ICD-10-CM | POA: Diagnosis not present

## 2024-01-14 DIAGNOSIS — Z111 Encounter for screening for respiratory tuberculosis: Secondary | ICD-10-CM | POA: Diagnosis not present

## 2024-01-15 DIAGNOSIS — M48062 Spinal stenosis, lumbar region with neurogenic claudication: Secondary | ICD-10-CM | POA: Diagnosis not present

## 2024-01-15 DIAGNOSIS — M47816 Spondylosis without myelopathy or radiculopathy, lumbar region: Secondary | ICD-10-CM | POA: Diagnosis not present

## 2024-01-15 DIAGNOSIS — M5126 Other intervertebral disc displacement, lumbar region: Secondary | ICD-10-CM | POA: Diagnosis not present

## 2024-01-15 DIAGNOSIS — M48061 Spinal stenosis, lumbar region without neurogenic claudication: Secondary | ICD-10-CM | POA: Diagnosis not present

## 2024-01-15 DIAGNOSIS — M4807 Spinal stenosis, lumbosacral region: Secondary | ICD-10-CM | POA: Diagnosis not present

## 2024-01-16 ENCOUNTER — Other Ambulatory Visit (HOSPITAL_BASED_OUTPATIENT_CLINIC_OR_DEPARTMENT_OTHER): Payer: Self-pay | Admitting: Radiology

## 2024-01-16 DIAGNOSIS — Z6823 Body mass index (BMI) 23.0-23.9, adult: Secondary | ICD-10-CM | POA: Diagnosis not present

## 2024-01-16 DIAGNOSIS — Z79899 Other long term (current) drug therapy: Secondary | ICD-10-CM | POA: Diagnosis not present

## 2024-01-16 DIAGNOSIS — M0579 Rheumatoid arthritis with rheumatoid factor of multiple sites without organ or systems involvement: Secondary | ICD-10-CM | POA: Diagnosis not present

## 2024-01-16 DIAGNOSIS — M1991 Primary osteoarthritis, unspecified site: Secondary | ICD-10-CM | POA: Diagnosis not present

## 2024-01-16 DIAGNOSIS — Z1231 Encounter for screening mammogram for malignant neoplasm of breast: Secondary | ICD-10-CM

## 2024-01-16 DIAGNOSIS — G5793 Unspecified mononeuropathy of bilateral lower limbs: Secondary | ICD-10-CM | POA: Diagnosis not present

## 2024-01-20 ENCOUNTER — Other Ambulatory Visit: Payer: PPO

## 2024-01-20 ENCOUNTER — Ambulatory Visit: Payer: PPO | Admitting: Internal Medicine

## 2024-01-27 DIAGNOSIS — H6992 Unspecified Eustachian tube disorder, left ear: Secondary | ICD-10-CM | POA: Diagnosis not present

## 2024-01-27 DIAGNOSIS — H903 Sensorineural hearing loss, bilateral: Secondary | ICD-10-CM | POA: Diagnosis not present

## 2024-01-27 DIAGNOSIS — H6502 Acute serous otitis media, left ear: Secondary | ICD-10-CM | POA: Diagnosis not present

## 2024-01-27 DIAGNOSIS — H90A32 Mixed conductive and sensorineural hearing loss, unilateral, left ear with restricted hearing on the contralateral side: Secondary | ICD-10-CM | POA: Diagnosis not present

## 2024-01-28 DIAGNOSIS — M5126 Other intervertebral disc displacement, lumbar region: Secondary | ICD-10-CM | POA: Diagnosis not present

## 2024-01-28 DIAGNOSIS — M5416 Radiculopathy, lumbar region: Secondary | ICD-10-CM | POA: Diagnosis not present

## 2024-01-30 ENCOUNTER — Inpatient Hospital Stay: Payer: PPO | Admitting: Internal Medicine

## 2024-01-30 ENCOUNTER — Inpatient Hospital Stay: Payer: PPO | Attending: Internal Medicine

## 2024-01-30 VITALS — BP 130/64 | HR 75 | Temp 98.3°F | Resp 16 | Ht 62.0 in | Wt 129.8 lb

## 2024-01-30 DIAGNOSIS — M069 Rheumatoid arthritis, unspecified: Secondary | ICD-10-CM | POA: Insufficient documentation

## 2024-01-30 DIAGNOSIS — K589 Irritable bowel syndrome without diarrhea: Secondary | ICD-10-CM | POA: Insufficient documentation

## 2024-01-30 DIAGNOSIS — Z860100 Personal history of colon polyps, unspecified: Secondary | ICD-10-CM | POA: Insufficient documentation

## 2024-01-30 DIAGNOSIS — K5790 Diverticulosis of intestine, part unspecified, without perforation or abscess without bleeding: Secondary | ICD-10-CM | POA: Insufficient documentation

## 2024-01-30 DIAGNOSIS — D500 Iron deficiency anemia secondary to blood loss (chronic): Secondary | ICD-10-CM | POA: Diagnosis not present

## 2024-01-30 DIAGNOSIS — H109 Unspecified conjunctivitis: Secondary | ICD-10-CM | POA: Diagnosis not present

## 2024-01-30 DIAGNOSIS — Z7969 Long term (current) use of other immunomodulators and immunosuppressants: Secondary | ICD-10-CM | POA: Diagnosis not present

## 2024-01-30 DIAGNOSIS — Z79899 Other long term (current) drug therapy: Secondary | ICD-10-CM | POA: Diagnosis not present

## 2024-01-30 DIAGNOSIS — M8580 Other specified disorders of bone density and structure, unspecified site: Secondary | ICD-10-CM | POA: Diagnosis not present

## 2024-01-30 DIAGNOSIS — K219 Gastro-esophageal reflux disease without esophagitis: Secondary | ICD-10-CM | POA: Diagnosis not present

## 2024-01-30 DIAGNOSIS — E785 Hyperlipidemia, unspecified: Secondary | ICD-10-CM | POA: Diagnosis not present

## 2024-01-30 DIAGNOSIS — K760 Fatty (change of) liver, not elsewhere classified: Secondary | ICD-10-CM | POA: Diagnosis not present

## 2024-01-30 DIAGNOSIS — J189 Pneumonia, unspecified organism: Secondary | ICD-10-CM | POA: Insufficient documentation

## 2024-01-30 DIAGNOSIS — D508 Other iron deficiency anemias: Secondary | ICD-10-CM

## 2024-01-30 LAB — CBC WITH DIFFERENTIAL (CANCER CENTER ONLY)
Abs Immature Granulocytes: 0.05 10*3/uL (ref 0.00–0.07)
Basophils Absolute: 0 10*3/uL (ref 0.0–0.1)
Basophils Relative: 0 %
Eosinophils Absolute: 0.1 10*3/uL (ref 0.0–0.5)
Eosinophils Relative: 2 %
HCT: 38.7 % (ref 36.0–46.0)
Hemoglobin: 12.8 g/dL (ref 12.0–15.0)
Immature Granulocytes: 1 %
Lymphocytes Relative: 13 %
Lymphs Abs: 0.9 10*3/uL (ref 0.7–4.0)
MCH: 32.3 pg (ref 26.0–34.0)
MCHC: 33.1 g/dL (ref 30.0–36.0)
MCV: 97.7 fL (ref 80.0–100.0)
Monocytes Absolute: 0.3 10*3/uL (ref 0.1–1.0)
Monocytes Relative: 4 %
Neutro Abs: 5.4 10*3/uL (ref 1.7–7.7)
Neutrophils Relative %: 80 %
Platelet Count: 179 10*3/uL (ref 150–400)
RBC: 3.96 MIL/uL (ref 3.87–5.11)
RDW: 15.9 % — ABNORMAL HIGH (ref 11.5–15.5)
WBC Count: 6.7 10*3/uL (ref 4.0–10.5)
nRBC: 0 % (ref 0.0–0.2)

## 2024-01-30 LAB — IRON AND IRON BINDING CAPACITY (CC-WL,HP ONLY)
Iron: 84 ug/dL (ref 28–170)
Saturation Ratios: 17 % (ref 10.4–31.8)
TIBC: 494 ug/dL — ABNORMAL HIGH (ref 250–450)
UIBC: 410 ug/dL (ref 148–442)

## 2024-01-30 LAB — FERRITIN: Ferritin: 57 ng/mL (ref 11–307)

## 2024-01-30 NOTE — Progress Notes (Signed)
 [facility] Telephone:([phone]) [phone]   [phone]  OFFICE PROGRESS NOTE  Alysia Crane, Courtney Crane-the-counter ferrous sulfate with vitamin C  INTERVAL HISTORY: Lin Ohalloran 73 y.o. female returns to the clinic today for follow-up visit.  Discussed the use of AI scribe software for clinical note transcription with the patient, who gave verbal consent to proceed.  History of Present Illness   The patient is a 73 year old with anemia who presents for follow-up.  She is currently taking oral iron supplements every other day with vitamin C. Her hemoglobin levels have been stable, with a current level of 12.8, compared to 12.1 in November and 12.4 in October. No bleeding or significant bruising, although she notes easy bruising with small areas from minor trauma. No blood in her stool.  She had extensive blood work done in November 2024 including iron study, ferritin, serum folate, vitamin B12, TSH, serum copper and ceruloplasmin that were unremarkable.  In February, she experienced a series of infections including an upper respiratory infection, ear infection, pneumonia, conjunctivitis, and thrush. She is currently on her third round of steroids and feels much better, although her stamina and energy levels have not fully returned to her previous state.      MEDICAL HISTORY: Past Medical History:  Diagnosis Date   Allergy    Anemia    Anxiety    Arthritis    Asthma    Cataract    Colon polyps    Complication of anesthesia    "sometimes I dont like to wake up after " ie difficult to awaken    Diverticulitis    Diverticulosis    Fatty liver    GERD (gastroesophageal reflux disease)    HLD (hyperlipidemia)    HTN (hypertension)    IBS (irritable bowel syndrome)    Insomnia     MRSA infection    Osteoarthritis    Osteopenia    PONV (postoperative nausea and vomiting)    RA (rheumatoid arthritis) (HCC)    Rheumatoid arthritis (HCC)    Seasonal allergies    Shingles    Status post dilation of esophageal narrowing    Vitamin D deficiency     ALLERGIES:  is allergic to meperidine, methotrexate, nickel, and tape.  MEDICATIONS:  Current Outpatient Medications  Medication Sig Dispense Refill   acetaminophen (TYLENOL) 500 MG tablet Take 1,000 mg by mouth every 6 (six) hours as needed (pain.).     albuterol (VENTOLIN HFA) 108 (90 Base) MCG/ACT inhaler TAKE 2 PUFFS BY MOUTH EVERY 6 HOURS AS NEEDED FOR WHEEZE OR SHORTNESS OF BREATH 18 each 1   amLODipine (NORVASC) 10 MG tablet Take 10 mg by mouth in the morning.     ascorbic acid (VITAMIN C) 100 MG tablet Take by mouth.     benazepril-hydrochlorthiazide (LOTENSIN HCT) 10-12.5 MG tablet      bismuth subsalicylate (PEPTO BISMOL) 262 MG chewable tablet Chew 262-524 mg by mouth 3 (three) times daily as needed for diarrhea or loose stools or indigestion.     Budeson-Glycopyrrol-Formoterol (BREZTRI AEROSPHERE) 160-9-4.8 MCG/ACT AERO Inhale 2 puffs into the lungs in the morning and at bedtime. (Patient taking differently: Inhale 1 puff into the lungs in the morning and at bedtime.) 10.7 g 5   Calcium Carb-Cholecalciferol (CALCIUM 600+D3 PO) Take 1 tablet  by mouth in the morning.     calcium elemental as carbonate (TUMS ULTRA 1000) 400 MG chewable tablet Chew 1,000 mg by mouth 3 (three) times daily as needed for heartburn.     CANNABIDIOL PO Take 1 capsule by mouth at bedtime. CBD     Cholecalciferol (VITAMIN D-3) 125 MCG (5000 UT) TABS Take 5,000 Units by mouth in the morning.     doxycycline (VIBRA-TABS) 100 MG tablet Take 1 tablet (100 mg total) by mouth 2 (two) times daily. 14 tablet 0   Dupilumab (DUPIXENT) 300 MG/2ML SOAJ Inject 300 mg into the skin every 14 (fourteen) days. 12 mL 2   famotidine (PEPCID) 40 MG tablet TAKE  1 TABLET BY MOUTH EVERYDAY AT BEDTIME 90 tablet 3   Ferrous Sulfate ([employer] SLOW RELEASE IRON PO) Take 65 mg by mouth 3 (three) times a week.     fexofenadine (ALLEGRA) 180 MG tablet Take 0.5 tablets (90 mg total) by mouth in the morning. (Patient taking differently: Take 90 mg by mouth in the morning. Patient gets OTC, please do not send it to pharmacy.) 30 tablet 5   hydrochlorothiazide (HYDRODIURIL) 12.5 MG tablet Take 12.5 mg by mouth every morning.     hydrocortisone 2.5 % cream Apply 1 Application topically as needed.     inFLIXimab (REMICADE) 100 MG injection Inject into the vein every 5 (five) weeks.     ipratropium (ATROVENT) 0.06 % nasal spray Place 1 spray into both nostrils daily. 15 mL 5   leflunomide (ARAVA) 20 MG tablet Take 20 mg by mouth every evening.     losartan (COZAAR) 100 MG tablet Take 100 mg by mouth daily.     Magnesium 250 MG TABS Take 250 mg by mouth every evening.     MELATONIN PO Take 1.5 mg by mouth at bedtime as needed (sleep).     meloxicam (MOBIC) 15 MG tablet Take 15 mg by mouth as needed.     mirabegron ER (MYRBETRIQ) 25 MG TB24 tablet Take 1 tablet (25 mg total) by mouth daily. 90 tablet 3   Multiple Vitamins-Minerals (IMMUNE SUPPORT VITAMIN C PO) Take 1 tablet by mouth in the morning. With Vitamin D 3/Vitamin A/Magnesium     Olopatadine HCl (PATADAY) 0.2 % SOLN Place 1 drop into both eyes daily as needed. (Patient taking differently: Place 1 drop into both eyes daily as needed. Patient gets OTC, please do not send it to pharmacy.) 2.5 mL 5   Omega-3 Fatty Acids (FISH OIL) 1200 MG CAPS Take 1,200 mg by mouth 2 (two) times daily.     omeprazole (PRILOSEC) 40 MG capsule TAKE 1 CAPSULE (40 MG TOTAL) BY MOUTH 2 (TWO) TIMES DAILY BEFORE A MEAL. 180 capsule 0   polyethylene glycol (MIRALAX / GLYCOLAX) 17 g packet Take 17 g by mouth every Monday, Wednesday, and Friday. In the morning     promethazine-dextromethorphan (PROMETHAZINE-DM) 6.25-15 MG/5ML syrup Take 5 mLs by  mouth 4 (four) times daily as needed for cough. 118 mL 0   Psyllium (METAMUCIL 4 IN 1 FIBER PO) Take 2 capsules by mouth in the morning. Metamucil 3-in-1 Psyllium Fiber Supplement Capsules     rifaximin (XIFAXAN) 550 MG TABS tablet Take 1 tablet (550 mg total) by mouth 3 (three) times daily. Dx:IBS-D, K58.0 42 tablet 0   rosuvastatin (CRESTOR) 5 MG tablet Take 5 mg by mouth every evening.     simethicone (MYLICON) 125 MG chewable tablet Chew 125 mg by mouth every 6 (six)  hours as needed for flatulence.     sodium chloride (OCEAN) 0.65 % SOLN nasal spray Place 1 spray into both nostrils in the morning and at bedtime.     temazepam (RESTORIL) 15 MG capsule Take 15 mg by mouth at bedtime as needed for sleep.     valACYclovir (VALTREX) 500 MG tablet Take 500 mg by mouth daily as needed (outbreaks).     No current facility-administered medications for this visit.    SURGICAL HISTORY:  Past Surgical History:  Procedure Laterality Date   ADENOIDECTOMY     APPENDECTOMY  1975   bladder tack  1990   CATARACT EXTRACTION, BILATERAL Bilateral    CHOLECYSTECTOMY N/A 01/31/2017   Procedure: LAPAROSCOPIC CHOLECYSTECTOMY WITH INTRAOPERATIVE CHOLANGIOGRAM;  Surgeon: Darnell Level, Courtney;  Location: [facility];  Service: General;  Laterality: N/A;   INCISION AND DRAINAGE PERIRECTAL ABSCESS N/A 12/02/2017   Procedure: IRRIGATION AND DEBRIDEMENT PERINEAL ABSCESS;  Surgeon: Gaynelle Adu, Courtney;  Location: [facility] OR;  Service: General;  Laterality: N/A;   LUMBAR FUSION     MENISCUS REPAIR Right 2013   REVERSE SHOULDER ARTHROPLASTY Right 12/06/2022   Procedure: RIGHT REVERSE SHOULDER ARTHROPLASTY;  Surgeon: Jones Broom, Courtney;  Location: [facility];  Service: Orthopedics;  Laterality: Right;   ROTATOR CUFF REPAIR Right 2005   TONSILLECTOMY     as a child   TOTAL ABDOMINAL HYSTERECTOMY  1988   TOTAL KNEE ARTHROPLASTY Left    WRIST RECONSTRUCTION Right 2008    REVIEW OF SYSTEMS:  A comprehensive review of systems was  negative.   PHYSICAL EXAMINATION: General appearance: alert, appears stated age, and no distress Head: Normocephalic, without obvious abnormality, atraumatic Neck: no adenopathy, no JVD, supple, symmetrical, trachea midline, and thyroid not enlarged, symmetric, no tenderness/mass/nodules Lymph nodes: Cervical, supraclavicular, and axillary nodes normal. Resp: clear to auscultation bilaterally Back: symmetric, no curvature. ROM normal. No CVA tenderness. Cardio: regular rate and rhythm, S1, S2 normal, no murmur, click, rub or gallop GI: soft, non-tender; bowel sounds normal; no masses,  no organomegaly Extremities: extremities normal, atraumatic, no cyanosis or edema  ECOG PERFORMANCE STATUS: 0 - Asymptomatic  Blood pressure 130/64, pulse 75, temperature 98.3 F (36.8 C), temperature source Temporal, resp. rate 16, height 5\' 2"  (1.575 m), weight 129 lb 12.8 oz (58.9 kg), SpO2 99%.  LABORATORY DATA: Lab Results  Component Value Date   WBC 6.7 01/30/2024   HGB 12.8 01/30/2024   HCT 38.7 01/30/2024   MCV 97.7 01/30/2024   PLT 179 01/30/2024      Chemistry      Component Value Date/Time   NA 137 10/12/2023 1105   K 3.7 10/12/2023 1105   CL 102 10/12/2023 1105   CO2 27 10/12/2023 1105   BUN 19 10/12/2023 1105   CREATININE 0.88 10/12/2023 1105      Component Value Date/Time   CALCIUM 9.7 10/12/2023 1105   ALKPHOS 62 10/12/2023 1105   AST 28 10/12/2023 1105   ALT 63 (H) 10/12/2023 1105   BILITOT 0.4 10/12/2023 1105       RADIOGRAPHIC STUDIES: No results found.  ASSESSMENT AND PLAN:      Anemia Chronic anemia, well-managed with oral iron supplements and vitamin C. Hemoglobin is stable at 12.8 g/dL. Awaiting iron and ferritin levels, with positive expectations. No significant bleeding or melena reported. -  She had extensive blood work done in November 2024 including iron study, ferritin, serum folate, vitamin B12, TSH, serum copper and ceruloplasmin that were  unremarkable. - Continue oral  iron supplementation with vitamin C every other day. - Review iron and ferritin levels when available. - Schedule follow-up in six months.  Infections Multiple infections including upper respiratory infection, otitis media, pneumonia, conjunctivitis, and thrush. Currently on third round of steroids with significant symptom improvement, but persistent fatigue. - Monitor recovery and energy levels.  Easy bruising Reports easy bruising without significant bleeding or melena. Bruising is minor and related to physical trauma. - Monitor for changes in bruising patterns or new symptoms.   The patient was advised to call immediately if she has any concerning symptoms in the interval. The patient voices understanding of current disease status and treatment options and is in agreement with the current care plan.  All questions were answered. The patient knows to call the clinic with any problems, questions or concerns. We can certainly see the patient much sooner if necessary.  The total time spent in the appointment was 20 minutes.  Disclaimer: This note was dictated with voice recognition software. Similar sounding words can inadvertently be transcribed and may not be corrected upon review.

## 2024-02-11 ENCOUNTER — Other Ambulatory Visit: Payer: Self-pay

## 2024-02-11 NOTE — Progress Notes (Signed)
 Specialty Pharmacy Refill Coordination Note  Courtney Crane is a 74 y.o. female contacted today regarding refills of specialty medication(s) Dupilumab (Dupixent)   Patient requested (Patient-Rptd) Delivery   Delivery date: 02/18/24   Verified address: (Patient-Rptd) [address] [place], [place] [address]   Medication will be filled on 02/17/24.

## 2024-02-12 ENCOUNTER — Ambulatory Visit (HOSPITAL_BASED_OUTPATIENT_CLINIC_OR_DEPARTMENT_OTHER): Admission: RE | Admit: 2024-02-12 | Payer: PPO | Source: Ambulatory Visit | Admitting: Radiology

## 2024-02-20 DIAGNOSIS — L03011 Cellulitis of right finger: Secondary | ICD-10-CM | POA: Diagnosis not present

## 2024-02-20 DIAGNOSIS — M0579 Rheumatoid arthritis with rheumatoid factor of multiple sites without organ or systems involvement: Secondary | ICD-10-CM | POA: Diagnosis not present

## 2024-02-24 DIAGNOSIS — H903 Sensorineural hearing loss, bilateral: Secondary | ICD-10-CM | POA: Diagnosis not present

## 2024-02-24 DIAGNOSIS — H6993 Unspecified Eustachian tube disorder, bilateral: Secondary | ICD-10-CM | POA: Diagnosis not present

## 2024-02-25 ENCOUNTER — Encounter: Payer: Self-pay | Admitting: Internal Medicine

## 2024-02-25 ENCOUNTER — Other Ambulatory Visit (HOSPITAL_COMMUNITY): Payer: Self-pay

## 2024-02-25 ENCOUNTER — Ambulatory Visit: Payer: PPO | Admitting: Internal Medicine

## 2024-02-25 VITALS — BP 120/78 | HR 77 | Ht 62.0 in | Wt 133.2 lb

## 2024-02-25 DIAGNOSIS — M069 Rheumatoid arthritis, unspecified: Secondary | ICD-10-CM | POA: Diagnosis not present

## 2024-02-25 DIAGNOSIS — J310 Chronic rhinitis: Secondary | ICD-10-CM | POA: Diagnosis not present

## 2024-02-25 DIAGNOSIS — J4550 Severe persistent asthma, uncomplicated: Secondary | ICD-10-CM

## 2024-02-25 MED ORDER — BREZTRI AEROSPHERE 160-9-4.8 MCG/ACT IN AERO: 2.0000 | INHALATION_SPRAY | Freq: Two times a day (BID) | RESPIRATORY_TRACT | 5 refills | Status: DC

## 2024-02-25 NOTE — Progress Notes (Signed)
 Courtney Crane    [id number]    [birth year]  Primary Care Physician:Courtney Penna, MD Date of Appointment: 02/25/2024 Established Patient Visit  Chief complaint:   Chief Complaint  Patient presents with   Consult    Establish care      HPI: Courtney Crane is a 73 y.o. woman with rheumatoid arthritis on Remicade, Severe persistent asthma on dupixent. Lifelong asthma since childhood.   Rheumatologist - Courtney Crane Allergy - Courtney Crane  Interval Updates: Here for followup today. Last seen by Courtney. Tonia Crane in 2023 and establishing care with me today.   At last visit with Courtney Crane she had increased her  breztri from 1 to 2 twice a day. Treated with doxycycline and steroids at that time. Going up on the breztri to twice a day did seem to help   RA affects her hands and feet.   Seeing ENT for ongoing hearing problems following URI. Using atrovent and flonase since February. Zyrtec at night recently  Current Regimen: Breztri 2 puffs twice daily, dupixent Asthma Triggers: seasonal allergies, exertion, dust, anxiety Exacerbations in the last year: once in the last 12 months History of hospitalization or intubation: never.  Allergy Testing/rhinitis: takes allegra daily.  GERD: ACT:  Asthma Control Test ACT Total Score  02/25/2024  1:27 PM 23  01/04/2023  3:00 PM 24   FeNO: Serum Eos/IgE:   I have reviewed the patient's family social and past medical history and updated as appropriate.   Past Medical History:  Diagnosis Date   Allergy    Anemia    Anxiety    Arthritis    Asthma    Cataract    Colon polyps    Complication of anesthesia    "sometimes I dont like to wake up after " ie difficult to awaken    Diverticulitis    Diverticulosis    Fatty liver    GERD (gastroesophageal reflux disease)    HLD (hyperlipidemia)    HTN (hypertension)    IBS (irritable bowel syndrome)    Insomnia    MRSA infection    Osteoarthritis    Osteopenia    PONV  (postoperative nausea and vomiting)    RA (rheumatoid arthritis) (HCC)    Rheumatoid arthritis (HCC)    Seasonal allergies    Shingles    Status post dilation of esophageal narrowing    Vitamin D deficiency     Past Surgical History:  Procedure Laterality Date   ADENOIDECTOMY     APPENDECTOMY  1975   bladder tack  1990   CATARACT EXTRACTION, BILATERAL Bilateral    CHOLECYSTECTOMY N/A 01/31/2017   Procedure: LAPAROSCOPIC CHOLECYSTECTOMY WITH INTRAOPERATIVE CHOLANGIOGRAM;  Surgeon: Darnell Level, MD;  Location: [facility];  Service: General;  Laterality: N/A;   INCISION AND DRAINAGE PERIRECTAL ABSCESS N/A 12/02/2017   Procedure: IRRIGATION AND DEBRIDEMENT PERINEAL ABSCESS;  Surgeon: Gaynelle Adu, MD;  Location: [facility] OR;  Service: General;  Laterality: N/A;   LUMBAR FUSION     MENISCUS REPAIR Right 2013   REVERSE SHOULDER ARTHROPLASTY Right 12/06/2022   Procedure: RIGHT REVERSE SHOULDER ARTHROPLASTY;  Surgeon: Jones Broom, MD;  Location: [facility];  Service: Orthopedics;  Laterality: Right;   ROTATOR CUFF REPAIR Right 2005   TONSILLECTOMY     as a child   TOTAL ABDOMINAL HYSTERECTOMY  1988   TOTAL KNEE ARTHROPLASTY Left    WRIST RECONSTRUCTION Right 2008    Family History  Problem Relation Age of Onset  Colon polyps Mother    Diabetes Mother    Heart disease Mother    Kidney cancer Mother    Arthritis Mother    Hypertension Mother    Hyperlipidemia Mother    Dementia Father    CAD Brother    Esophageal cancer Brother    Colon polyps Son    Colon cancer Neg Hx    Stomach cancer Neg Hx    Rectal cancer Neg Hx    Prostate cancer Neg Hx    Pancreatic cancer Neg Hx     Social History   Occupational History   Occupation: retired  Tobacco Use   Smoking status: Never    Passive exposure: Never   Smokeless tobacco: Never  Vaping Use   Vaping status: Never Used  Substance and Sexual Activity   Alcohol use: Yes    Alcohol/week: 7.0 standard drinks of alcohol    Types: 7  Standard drinks or equivalent per week    Comment: wine daily   Drug use: No    Comment: takes CBD oil   Sexual activity: Not Currently    Partners: Male    Birth control/protection: Post-menopausal    Comment: Hx HSV, High RISK Medicare     Physical Exam: Blood pressure 120/78, pulse 77, height 5\' 2"  (1.575 m), weight 133 lb 3.2 oz (60.4 kg), SpO2 97%.  Gen:      No acute distress ENT:  left ear canal erythema, probable effusion, right ear clear, no effusion no nasal polyps, mucus membranes moist Lungs:    No increased respiratory effort, symmetric chest wall excursion, clear to auscultation bilaterally, no wheezes or crackles CV:         Regular rate and rhythm; no murmurs, rubs, or gallops.  No pedal edema   Data Reviewed: Imaging: I have personally reviewed the chest xray Jan 2024 - no acute process  PFTs:     Latest Ref Rng & Units 01/24/2022    3:54 PM  PFT Results  FVC-Pre L 2.64   FVC-Predicted Pre % 91   FVC-Post L 2.74   FVC-Predicted Post % 95   Pre FEV1/FVC % % 72   Post FEV1/FCV % % 77   FEV1-Pre L 1.90   FEV1-Predicted Pre % 87   FEV1-Post L 2.10   DLCO uncorrected ml/min/mmHg 18.05   DLCO UNC% % 95   DLCO corrected ml/min/mmHg 18.34   DLCO COR %Predicted % 97   DLVA Predicted % 99   TLC L 4.75   TLC % Predicted % 96   RV % Predicted % 104    I have personally reviewed the patient's PFTs and normal pulmonary function  Labs:  Immunization status: Immunization History  Administered Date(s) Administered   Fluad Quad(high Dose 65+) 09/03/2022   Influenza,inj,quad, With Preservative 07/20/2018, 08/20/2019   Influenza-Unspecified 10/02/2023   PFIZER(Purple Top)SARS-COV-2 Vaccination 07/14/2020   Tdap 11/20/2011   Zoster, Live 11/19/2012    External Records Personally Reviewed: pulmonary  Assessment:  Severe persistent asthma, controlled Rheumatoid arthritis, multiple sites, on remicade Seasonal allergic rhinitis Chronic post nasal  drainage Possible left ear effusion   Plan/Recommendations:  Glad your asthma is doing well.   Continue breztri 2 puffs twice daily, gargle after use.   Continue albuterol inhaler as needed.  Continue dupixent injections.   Continue your nasal sprays and allegra for the seasonal allergies.  Good luck following up with the ENT surgeon about your left ear.   Return to Care: Return in about  6 months (around 08/26/2024).   Durel Salts, MD Pulmonary and Critical Care Medicine [facility] [phone]

## 2024-02-25 NOTE — Patient Instructions (Signed)
 It was a pleasure to see you today!  Please schedule follow up with myself in 6 months.  If my schedule is not open yet, we will contact you with a reminder closer to that time. Please call [phone] if you haven't heard from [place] a month before, and always call us sooner if issues or concerns arise. You can also send [place] a message through MyChart, but but aware that this is not to be used for urgent issues and it may take up to 5-7 days to receive a reply. Please be aware that you will likely be able to view your results before I have a chance to respond to them. Please give [place] 5 business days to respond to any non-urgent results.    Glad your asthma is doing well.   Continue breztri 2 puffs twice daily, gargle after use.   Continue albuterol inhaler as needed.  Continue dupixent injections.   Continue your nasal sprays and allegra for the seasonal allergies.  Good luck following up with the ENT surgeon about your left ear.

## 2024-02-26 ENCOUNTER — Other Ambulatory Visit: Payer: Self-pay | Admitting: Gastroenterology

## 2024-03-06 ENCOUNTER — Encounter (HOSPITAL_BASED_OUTPATIENT_CLINIC_OR_DEPARTMENT_OTHER): Payer: Self-pay | Admitting: Radiology

## 2024-03-06 ENCOUNTER — Ambulatory Visit (HOSPITAL_BASED_OUTPATIENT_CLINIC_OR_DEPARTMENT_OTHER)
Admission: RE | Admit: 2024-03-06 | Discharge: 2024-03-06 | Disposition: A | Discharge status: Home / Self Care | Source: Ambulatory Visit | Attending: Radiology | Admitting: Radiology

## 2024-03-06 DIAGNOSIS — Z1231 Encounter for screening mammogram for malignant neoplasm of breast: Secondary | ICD-10-CM | POA: Insufficient documentation

## 2024-03-10 DIAGNOSIS — H9113 Presbycusis, bilateral: Secondary | ICD-10-CM | POA: Diagnosis not present

## 2024-03-10 DIAGNOSIS — M26622 Arthralgia of left temporomandibular joint: Secondary | ICD-10-CM | POA: Diagnosis not present

## 2024-03-10 DIAGNOSIS — M26629 Arthralgia of temporomandibular joint, unspecified side: Secondary | ICD-10-CM | POA: Insufficient documentation

## 2024-03-13 ENCOUNTER — Other Ambulatory Visit: Payer: Self-pay | Admitting: Radiology

## 2024-03-13 DIAGNOSIS — R928 Other abnormal and inconclusive findings on diagnostic imaging of breast: Secondary | ICD-10-CM

## 2024-03-16 ENCOUNTER — Other Ambulatory Visit: Payer: Self-pay

## 2024-03-18 ENCOUNTER — Other Ambulatory Visit (HOSPITAL_COMMUNITY): Payer: Self-pay | Admitting: Pharmacy Technician

## 2024-03-18 ENCOUNTER — Other Ambulatory Visit (HOSPITAL_COMMUNITY): Payer: Self-pay

## 2024-03-18 NOTE — Progress Notes (Signed)
 Specialty Pharmacy Refill Coordination Note  Courtney Crane is a 73 y.o. female contacted today regarding refills of specialty medication(s) Dupilumab  (Dupixent )   Patient requested Delivery   Delivery date: 03/20/24 (No weekend delivery.)   Verified address: [address] [place] [address]   Medication will be filled on 03/19/24.

## 2024-03-19 ENCOUNTER — Other Ambulatory Visit: Payer: Self-pay

## 2024-03-20 ENCOUNTER — Other Ambulatory Visit: Payer: Self-pay

## 2024-03-20 ENCOUNTER — Ambulatory Visit: Payer: PPO | Admitting: Allergy

## 2024-03-20 ENCOUNTER — Encounter: Payer: Self-pay | Admitting: Allergy

## 2024-03-20 VITALS — BP 128/70 | HR 68 | Temp 98.0°F | Resp 16 | Ht 62.0 in | Wt 132.8 lb

## 2024-03-20 DIAGNOSIS — J3089 Other allergic rhinitis: Secondary | ICD-10-CM

## 2024-03-20 DIAGNOSIS — H1013 Acute atopic conjunctivitis, bilateral: Secondary | ICD-10-CM | POA: Diagnosis not present

## 2024-03-20 DIAGNOSIS — H938X2 Other specified disorders of left ear: Secondary | ICD-10-CM | POA: Diagnosis not present

## 2024-03-20 DIAGNOSIS — J4530 Mild persistent asthma, uncomplicated: Secondary | ICD-10-CM | POA: Diagnosis not present

## 2024-03-20 MED ORDER — AIRSUPRA 90-80 MCG/ACT IN AERO: 2.0000 | INHALATION_SPRAY | RESPIRATORY_TRACT | 1 refills | Status: DC | PRN

## 2024-03-20 MED ORDER — XHANCE 93 MCG/ACT NA EXHU: 2.0000 | INHALANT_SUSPENSION | Freq: Two times a day (BID) | NASAL | 5 refills | Status: DC | PRN

## 2024-03-20 NOTE — Patient Instructions (Addendum)
-   Continue Dupixent  injections every 2 weeks.  Rotate your injection areas (can use arms as well if needed if not self-administering) - Continue Breztri  2 puffs twice a day.   - have access to AIRSUPRA OR albuterol  inhaler 2 puffs every 4-6 hours as needed for cough/wheeze/shortness of breath/chest tightness.  May use 15-20 minutes prior to activity.   Monitor frequency of use.   AIRSUPRA is a newer combination rescue inhaler that provides quick relief like albuterol  but has longer lasting effects.  Sample provided.   Breathing control goals:  Full participation in all desired activities (may need albuterol  before activity) Albuterol  use two time or less a week on average (not counting use with activity) Cough interfering with sleep two time or less a month Oral steroids no more than once a year No hospitalizations  - Continue avoidance measures for dust mites, cat, dog, tree pollen, grass pollen, weed pollens.   - continue Zyrtec  and/or Allegra  dose 1-2 times a day is ok to use depending on symptom severity - continue Atrovent  nasal spray 1 spray daily.  For nasal drainage/runny nose can take up to 3-4 times a day if needed - for itch/watery eyes can use OTC Pataday  or Pataday  extra strength 1 drop each eye daily as needed - if medication management is not effective then can consider course of allergen immunotherapy  - ear fullness is likely due to Eustachian tube dysfunction (ETD) - Xhance nasal spray device is our best option to have impact on reducing ETD inflammation.  This device allows for deeper deposition of the nasal steroid and also concentrates the medications in the nasal cavity more for better effect.   Can use 2 sprays 1-2 times a day as needed.   Blow into mouthpiece and push nasal spray part at same time to deploy medication in to nasal cavity.   Follow-up in 6 months or sooner if needed

## 2024-03-20 NOTE — Addendum Note (Signed)
 Addended by: Denton Flakes on: 03/20/2024 04:53 PM   Modules accepted: Orders

## 2024-03-20 NOTE — Progress Notes (Signed)
 Follow-up Note  RE: Courtney Crane MRN: [id number] DOB: [birth year] Date of Office Visit: 03/20/2024   History of present illness: Courtney Crane is a 73 y.o. female presenting today for follow-up of asthma, allergic rhinitis with conjunctivitis.  She was last seen in the office on 07/05/2023 by myself. Discussed the use of AI scribe software for clinical note transcription with the patient, who gave verbal consent to proceed.  She experienced a bit of an asthma flare during a recent trip to [place] , where she encountered high humidity and different pollens. She used her albuterol  inhaler more frequently than usual, approximately once every other day, but has not needed it since returning home. Her breathing has improved significantly since her return but she still has a bit of a residual cough that is improving.  She typically visits [place]  once a year and has noticed that the environmental conditions there trigger her asthma symptoms. During her trip, she began taking Zyrtec  at night and Allegra  in the morning, which she found helpful. She is currently using Dupixent  for her asthma, which she administers every two weeks with the assistance of her husband, who injects it into her abdomen. She also uses a maintenance inhaler, Breztri  taking two puffs twice a day at this time.  In March, she experienced increased allergy symptoms, using her albuterol  occasionally when pollen counts were high. She uses Atrovent  nasal spray daily to manage postnasal drip, which has effectively stopped her sore throat and coughing due to drainage from. She occasionally uses Pataday  for eye symptoms, but reports minimal eye irritation.  In February, she had an ear infection and an upper respiratory infection, which led to a persistent feeling of fullness in her ear. She has seen an ENT multiple times, who suggested it might be due to eustachian tube inflammation or TMJ.      Review of systems: 10pt ROS  negative unless noted above in HPI  Past medical/social/surgical/family history have been reviewed and are unchanged unless specifically indicated below.  No changes  Medication List: Current Outpatient Medications  Medication Sig Dispense Refill   acetaminophen  (TYLENOL ) 500 MG tablet Take 1,000 mg by mouth every 6 (six) hours as needed (pain.).     albuterol  (VENTOLIN  HFA) 108 (90 Base) MCG/ACT inhaler TAKE 2 PUFFS BY MOUTH EVERY 6 HOURS AS NEEDED FOR WHEEZE OR SHORTNESS OF BREATH 18 each 1   amLODipine  (NORVASC ) 10 MG tablet Take 10 mg by mouth in the morning.     ascorbic acid  (VITAMIN C) 100 MG tablet Take by mouth.     benazepril-hydrochlorthiazide (LOTENSIN HCT) 10-12.5 MG tablet      bismuth subsalicylate (PEPTO BISMOL) 262 MG chewable tablet Chew 262-524 mg by mouth 3 (three) times daily as needed for diarrhea or loose stools or indigestion.     budeson-glycopyrrolate-formoterol (BREZTRI  AEROSPHERE) 160-9-4.8 MCG/ACT AERO inhaler Inhale 2 puffs into the lungs in the morning and at bedtime. 10.7 g 5   Calcium  Carb-Cholecalciferol  (CALCIUM  600+D3 PO) Take 1 tablet by mouth in the morning.     calcium  elemental as carbonate (TUMS ULTRA 1000) 400 MG chewable tablet Chew 1,000 mg by mouth 3 (three) times daily as needed for heartburn.     CANNABIDIOL PO Take 1 capsule by mouth at bedtime. CBD     Cholecalciferol  (VITAMIN D -3) 125 MCG (5000 UT) TABS Take 5,000 Units by mouth in the morning.     COMIRNATY syringe Inject 0.3 mLs into the muscle once.     Dupilumab  (DUPIXENT )  300 MG/2ML SOAJ Inject 300 mg into the skin every 14 (fourteen) days. 12 mL 2   famotidine  (PEPCID ) 40 MG tablet TAKE 1 TABLET BY MOUTH EVERYDAY AT BEDTIME 90 tablet 3   Ferrous Sulfate (CVS SLOW RELEASE IRON PO) Take 65 mg by mouth 3 (three) times a week.     fexofenadine  (ALLEGRA ) 180 MG tablet Take 0.5 tablets (90 mg total) by mouth in the morning. (Patient taking differently: Take 90 mg by mouth in the morning.  Patient gets OTC, please do not send it to pharmacy.) 30 tablet 5   FLUZONE HIGH-DOSE 0.5 ML injection Inject 0.5 mLs into the muscle once.     hydrochlorothiazide  (HYDRODIURIL ) 12.5 MG tablet Take 12.5 mg by mouth every morning.     hydrocortisone  2.5 % cream Apply 1 Application topically as needed.     inFLIXimab  (REMICADE ) 100 MG injection Inject into the vein every 5 (five) weeks.     ipratropium (ATROVENT ) 0.06 % nasal spray Place 1 spray into both nostrils daily. 15 mL 5   leflunomide (ARAVA) 20 MG tablet Take 20 mg by mouth every evening.     losartan  (COZAAR ) 100 MG tablet Take 100 mg by mouth daily.     Magnesium 250 MG TABS Take 250 mg by mouth every evening.     MELATONIN PO Take 1.5 mg by mouth at bedtime as needed (sleep).     meloxicam (MOBIC) 15 MG tablet Take 15 mg by mouth as needed.     mirabegron  ER (MYRBETRIQ ) 25 MG TB24 tablet Take 1 tablet (25 mg total) by mouth daily. 90 tablet 3   Multiple Vitamins-Minerals (IMMUNE SUPPORT VITAMIN C PO) Take 1 tablet by mouth in the morning. With Vitamin D  3/Vitamin A/Magnesium     Olopatadine  HCl (PATADAY  OP) Apply 1 drop to eye as needed.     Omega-3 Fatty Acids (FISH OIL) 1200 MG CAPS Take 1,200 mg by mouth 2 (two) times daily.     omeprazole  (PRILOSEC) 40 MG capsule TAKE 1 CAPSULE (40 MG TOTAL) BY MOUTH 2 (TWO) TIMES DAILY BEFORE A MEAL. 180 capsule 1   polyethylene glycol (MIRALAX  / GLYCOLAX ) 17 g packet Take 17 g by mouth every Monday, Wednesday, and Friday. In the morning     Psyllium (METAMUCIL 4 IN 1 FIBER PO) Take 2 capsules by mouth in the morning. Metamucil 3-in-1 Psyllium Fiber Supplement Capsules     rosuvastatin  (CRESTOR ) 5 MG tablet Take 5 mg by mouth every evening.     simethicone (MYLICON) 125 MG chewable tablet Chew 125 mg by mouth every 6 (six) hours as needed for flatulence.     sodium chloride  (OCEAN) 0.65 % SOLN nasal spray Place 1 spray into both nostrils in the morning and at bedtime.     temazepam (RESTORIL) 15  MG capsule Take 15 mg by mouth at bedtime as needed for sleep.     valACYclovir  (VALTREX ) 500 MG tablet Take 500 mg by mouth daily as needed (outbreaks).     No current facility-administered medications for this visit.     Known medication allergies: Allergies  Allergen Reactions   Meperidine Nausea Only   Methotrexate Other (See Comments)    Unknown reaction   Nickel Rash   Tape Rash and Other (See Comments)     Physical examination: Blood pressure 128/70, pulse 68, temperature 98 F (36.7 C), temperature source Temporal, resp. rate 16, height 5\' 2"  (1.575 m), weight 132 lb 12.8 oz (60.2 kg), SpO2 96%.  General: Alert, interactive, in no acute distress. HEENT: PERRLA, TMs pearly gray, turbinates non-edematous without discharge, post-pharynx non erythematous. Neck: Supple without lymphadenopathy. Lungs: Clear to auscultation without wheezing, rhonchi or rales. {no increased work of breathing. CV: Normal S1, S2 without murmurs. Abdomen: Nondistended, nontender. Skin: Warm and dry, without lesions or rashes. Extremities:  No clubbing, cyanosis or edema. Neuro:   Grossly intact.  Diagnositics/Labs: Spirometry: FEV1: 1.95L 98%, FVC: 2.56L 100%, ratio consistent with nonobstructive pattern  Assessment and plan: Asthma  - Continue Dupixent  injections every 2 weeks.  Rotate your injection areas (can use arms as well if needed if not self-administering) - Continue Breztri  2 puffs twice a day.   - have access to AIRSUPRA OR albuterol  inhaler 2 puffs every 4-6 hours as needed for cough/wheeze/shortness of breath/chest tightness.  May use 15-20 minutes prior to activity.   Monitor frequency of use.   AIRSUPRA is a newer combination rescue inhaler that provides quick relief like albuterol  but has longer lasting effects.  Sample provided.   Breathing control goals:  Full participation in all desired activities (may need albuterol  before activity) Albuterol  use two time or less a week on  average (not counting use with activity) Cough interfering with sleep two time or less a month Oral steroids no more than once a year No hospitalizations  Allergic rhinitis with conjunctivitis - Continue avoidance measures for dust mites, cat, dog, tree pollen, grass pollen, weed pollens.   - continue Zyrtec  and/or Allegra  dose 1-2 times a day is ok to use depending on symptom severity - continue Atrovent  nasal spray 1 spray daily.  For nasal drainage/runny nose can take up to 3-4 times a day if needed - for itch/watery eyes can use OTC Pataday  or Pataday  extra strength 1 drop each eye daily as needed - if medication management is not effective then can consider course of allergen immunotherapy  Fullness of ear - ear fullness is likely due to Eustachian tube dysfunction (ETD) - Xhance nasal spray device is our best option to have impact on reducing ETD inflammation.  This device allows for deeper deposition of the nasal steroid and also concentrates the medications in the nasal cavity more for better effect.   Can use 2 sprays 1-2 times a day as needed.   Blow into mouthpiece and push nasal spray part at same time to deploy medication in to nasal cavity.   Follow-up in 6 months or sooner if needed  I appreciate the opportunity to take part in [place] care. Please do not hesitate to contact me with questions.  Sincerely,   Catha Clink, MD Allergy/Immunology [facility] 

## 2024-03-23 DIAGNOSIS — M5416 Radiculopathy, lumbar region: Secondary | ICD-10-CM | POA: Diagnosis not present

## 2024-03-23 DIAGNOSIS — Z9889 Other specified postprocedural states: Secondary | ICD-10-CM | POA: Diagnosis not present

## 2024-03-24 DIAGNOSIS — L03011 Cellulitis of right finger: Secondary | ICD-10-CM | POA: Diagnosis not present

## 2024-03-24 DIAGNOSIS — D2272 Melanocytic nevi of left lower limb, including hip: Secondary | ICD-10-CM | POA: Diagnosis not present

## 2024-03-24 DIAGNOSIS — L821 Other seborrheic keratosis: Secondary | ICD-10-CM | POA: Diagnosis not present

## 2024-03-24 DIAGNOSIS — D2271 Melanocytic nevi of right lower limb, including hip: Secondary | ICD-10-CM | POA: Diagnosis not present

## 2024-03-24 DIAGNOSIS — L565 Disseminated superficial actinic porokeratosis (DSAP): Secondary | ICD-10-CM | POA: Diagnosis not present

## 2024-03-24 DIAGNOSIS — L820 Inflamed seborrheic keratosis: Secondary | ICD-10-CM | POA: Diagnosis not present

## 2024-03-24 DIAGNOSIS — L570 Actinic keratosis: Secondary | ICD-10-CM | POA: Diagnosis not present

## 2024-03-24 DIAGNOSIS — D3612 Benign neoplasm of peripheral nerves and autonomic nervous system, upper limb, including shoulder: Secondary | ICD-10-CM | POA: Diagnosis not present

## 2024-03-24 NOTE — Addendum Note (Signed)
 Addended by: Norville Beery, Dnasia Gauna on: 03/24/2024 04:59 PM   Modules accepted: Orders

## 2024-03-25 ENCOUNTER — Ambulatory Visit
Admission: RE | Admit: 2024-03-25 | Discharge: 2024-03-25 | Disposition: A | Discharge status: Home / Self Care | Source: Ambulatory Visit | Attending: Radiology | Admitting: Radiology

## 2024-03-25 DIAGNOSIS — R928 Other abnormal and inconclusive findings on diagnostic imaging of breast: Secondary | ICD-10-CM

## 2024-03-26 DIAGNOSIS — Z79899 Other long term (current) drug therapy: Secondary | ICD-10-CM | POA: Diagnosis not present

## 2024-03-26 DIAGNOSIS — M0579 Rheumatoid arthritis with rheumatoid factor of multiple sites without organ or systems involvement: Secondary | ICD-10-CM | POA: Diagnosis not present

## 2024-03-26 DIAGNOSIS — R5383 Other fatigue: Secondary | ICD-10-CM | POA: Diagnosis not present

## 2024-03-26 DIAGNOSIS — Z111 Encounter for screening for respiratory tuberculosis: Secondary | ICD-10-CM | POA: Diagnosis not present

## 2024-03-27 ENCOUNTER — Other Ambulatory Visit (HOSPITAL_COMMUNITY): Payer: Self-pay

## 2024-03-27 ENCOUNTER — Telehealth: Payer: Self-pay

## 2024-03-27 NOTE — Telephone Encounter (Signed)
*  Asthma/Allergy  Pharmacy Patient Advocate Encounter   Received notification from CoverMyMeds that prior authorization for Xhance  93MCG/ACT exhaler suspension  is required/requested.   Insurance verification completed.   The patient is insured through [facility] ADVANTAGE/RX ADVANCE .   Per test claim: PA required; PA submitted to above mentioned insurance via CoverMyMeds Key/confirmation #/EOC [id number] Status is pending

## 2024-03-30 NOTE — Telephone Encounter (Signed)
 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

## 2024-04-03 NOTE — Telephone Encounter (Signed)
 Appeal still pending

## 2024-04-06 DIAGNOSIS — L821 Other seborrheic keratosis: Secondary | ICD-10-CM | POA: Diagnosis not present

## 2024-04-06 DIAGNOSIS — L570 Actinic keratosis: Secondary | ICD-10-CM | POA: Diagnosis not present

## 2024-04-06 NOTE — Telephone Encounter (Signed)
 Approved on May 9 by [employer] 2017 17-MAY-25:31-DEC-25 Xhance  93MCG/ACT NA EXHU Quantity:16;

## 2024-04-14 ENCOUNTER — Other Ambulatory Visit: Payer: Self-pay

## 2024-04-14 ENCOUNTER — Other Ambulatory Visit: Payer: Self-pay | Admitting: Pharmacy Technician

## 2024-04-14 NOTE — Progress Notes (Signed)
 Specialty Pharmacy Refill Coordination Note  Courtney Crane is a 73 y.o. female contacted today regarding refills of specialty medication(s) Dupilumab  (Dupixent )   Patient requested Delivery   Delivery date: 04/17/24   Verified address: [address] [place], [place] [address]   Medication will be filled on 04/16/24.

## 2024-04-15 ENCOUNTER — Other Ambulatory Visit: Payer: Self-pay

## 2024-04-16 ENCOUNTER — Other Ambulatory Visit: Payer: Self-pay

## 2024-04-30 DIAGNOSIS — M0579 Rheumatoid arthritis with rheumatoid factor of multiple sites without organ or systems involvement: Secondary | ICD-10-CM | POA: Diagnosis not present

## 2024-05-11 ENCOUNTER — Other Ambulatory Visit: Payer: Self-pay

## 2024-05-12 ENCOUNTER — Encounter (INDEPENDENT_AMBULATORY_CARE_PROVIDER_SITE_OTHER): Payer: Self-pay

## 2024-05-12 ENCOUNTER — Other Ambulatory Visit: Payer: Self-pay

## 2024-05-12 NOTE — Progress Notes (Signed)
 Specialty Pharmacy Refill Coordination Note  Courtney Crane is a 73 y.o. female contacted today regarding refills of specialty medication(s) Dupilumab  (Dupixent )   Patient requested Delivery   Delivery date: 05/15/24   Verified address: (Patient-Rptd) 04/2724   Medication will be filled on 06.26.25.   Left voicemail for patient regarding delivery address.

## 2024-05-13 ENCOUNTER — Other Ambulatory Visit: Payer: Self-pay

## 2024-05-13 DIAGNOSIS — M5416 Radiculopathy, lumbar region: Secondary | ICD-10-CM | POA: Diagnosis not present

## 2024-05-13 DIAGNOSIS — M5126 Other intervertebral disc displacement, lumbar region: Secondary | ICD-10-CM | POA: Diagnosis not present

## 2024-05-14 ENCOUNTER — Ambulatory Visit
Admission: RE | Admit: 2024-05-14 | Discharge: 2024-05-14 | Disposition: A | Discharge status: Home / Self Care | Source: Ambulatory Visit | Attending: Internal Medicine | Admitting: Internal Medicine

## 2024-05-14 ENCOUNTER — Ambulatory Visit: Admitting: Internal Medicine

## 2024-05-14 ENCOUNTER — Encounter: Payer: Self-pay | Admitting: Internal Medicine

## 2024-05-14 ENCOUNTER — Ambulatory Visit
Admission: RE | Admit: 2024-05-14 | Discharge: 2024-05-14 | Disposition: A | Discharge status: Home / Self Care | Attending: Internal Medicine | Admitting: Internal Medicine

## 2024-05-14 VITALS — BP 150/80 | HR 80 | Temp 98.5°F | Ht 62.0 in | Wt 132.4 lb

## 2024-05-14 DIAGNOSIS — I2089 Other forms of angina pectoris: Secondary | ICD-10-CM | POA: Diagnosis not present

## 2024-05-14 DIAGNOSIS — J4550 Severe persistent asthma, uncomplicated: Secondary | ICD-10-CM | POA: Diagnosis not present

## 2024-05-14 DIAGNOSIS — Z96611 Presence of right artificial shoulder joint: Secondary | ICD-10-CM | POA: Diagnosis not present

## 2024-05-14 DIAGNOSIS — R0602 Shortness of breath: Secondary | ICD-10-CM | POA: Diagnosis not present

## 2024-05-14 LAB — NITRIC OXIDE: Nitric Oxide: 15

## 2024-05-14 MED ORDER — IPRATROPIUM-ALBUTEROL 0.5-2.5 (3) MG/3ML IN SOLN: 3.0000 mL | RESPIRATORY_TRACT | 10 refills | Status: AC | PRN

## 2024-05-14 MED ORDER — IPRATROPIUM-ALBUTEROL 0.5-2.5 (3) MG/3ML IN SOLN
3.0000 mL | Freq: Once | RESPIRATORY_TRACT | Status: AC
  Administered 2024-05-14: 3 mL via RESPIRATORY_TRACT

## 2024-05-14 NOTE — Patient Instructions (Addendum)
 Continue medications for asthma as prescribed Continue Breztri  as prescribed Please rinse mouth after use Continue Dupixent  as prescribed Continue Airsupra  as prescribed  Recommend cardiology referral to assess cardiac function  Plan for chest x-ray Plan for nebulizer treatment in the office today Your walking test in the office did not show you need oxygen  Avoid Allergens and Irritants Avoid secondhand smoke Avoid SICK contacts Recommend  Masking  when appropriate Recommend Keep up-to-date with vaccinations  If symptoms get worse please go to emergency room or urgent care

## 2024-05-14 NOTE — Progress Notes (Signed)
 SYNOPSIS Courtney Crane is a 73 y.o. female with rheumatoid arthritis ( on nfliximab infusion), normal PFTs  with baseline shortness of breath related to persistent asthma symptoms. She is now on Dupixent .   Maintenance Inhaler>> Breztri  Maintenance Biologic>> Dupixent  Rescue>> Albuterol  as needed  Test Results: CT Chest October 2022: No PE, there is some subtle parenchymal changes, possibly related to atelectasis but there is an obvious difference there on her CT imaging.   HRCT chest 01/26/2022: No evidence of interstitial lung disease, lung nodules stable, thyroid  nodule.    Pulmonary Functions Testing Results:     Latest Ref Rng & Units 01/24/2022    3:54 PM  PFT Results  FVC-Pre L 2.64   FVC-Predicted Pre % 91   FVC-Post L 2.74   FVC-Predicted Post % 95   Pre FEV1/FVC % % 72   Post FEV1/FCV % % 77   FEV1-Pre L 1.90   FEV1-Predicted Pre % 87   FEV1-Post L 2.10   DLCO uncorrected ml/min/mmHg 18.05   DLCO UNC% % 95   DLCO corrected ml/min/mmHg 18.34   DLCO COR %Predicted % 97   DLVA Predicted % 99   TLC L 4.75   TLC % Predicted % 96   RV % Predicted % 104       CC Follow up assessment of ASTHMA  HPI Patient having persistent symptoms of increased shortness of breath dyspnea on exertion No signs of exacerbation intermittent wheezing No signs of infection 4 weeks ago patient was able to walk 1.5 miles however in the last several weeks patient has decreased exercise capacity Patient has been using her albuterol  more frequently She went on a 10-day vacation in an [place] cruise and was not able to exert herself Patient currently on Breztri  inhaler Currently on Dupixent  injections Uses Airsupra  as needed Patient was treated for asthma exacerbation in April 2025  Assessment of ASTHMA FeNO   15 ppb-Elevated exhaled Nitric oxide  testing is not  consistent with type II inflammation However we will continue medications for Breztri  Dupixent  and Airsupra    No  exacerbation at this time No evidence of heart failure at this time No evidence or signs of infection at this time No respiratory distress No fevers, chills, nausea, vomiting, diarrhea No evidence of lower extremity edema No evidence hemoptysis  Patient does feel she has a elephant on her chest She has occasional nausea Has more shortness of breath and wheezing Her cough is intermittent  Will plan to give her a DuoNeb in the office today to see if that helps Ambulating pulse oximetry in the office did not reveal hypoxia      Latest Ref Rng & Units 01/30/2024   12:55 PM 10/12/2023   11:05 AM 09/13/2023   11:56 AM  CBC  WBC 4.0 - 10.5 K/uL 6.7  7.9  4.4   Hemoglobin 12.0 - 15.0 g/dL 87.1  87.8  87.5   Hematocrit 36.0 - 46.0 % 38.7  37.2  38.8   Platelets 150 - 400 K/uL 179  150  180.0        Latest Ref Rng & Units 10/12/2023   11:05 AM 12/04/2022    2:22 PM 11/27/2021   11:24 AM  BMP  Glucose 70 - 99 mg/dL 889  885  98   BUN 8 - 23 mg/dL 19  16  23    Creatinine 0.44 - 1.00 mg/dL 9.11  9.07  9.09   Sodium 135 - 145 mmol/L 137  136  136  Potassium 3.5 - 5.1 mmol/L 3.7  3.6  4.1   Chloride 98 - 111 mmol/L 102  101  100   CO2 22 - 32 mmol/L 27  24  28    Calcium  8.9 - 10.3 mg/dL 9.7  9.3  9.7     BNP    Component Value Date/Time   BNP 19.7 08/31/2021 1525   Past medical hx Past Medical History:  Diagnosis Date   Allergy    Anemia    Anxiety    Arthritis    Asthma    Cataract    Colon polyps    Complication of anesthesia    sometimes I dont like to wake up after  ie difficult to awaken    Diverticulitis    Diverticulosis    Fatty liver    GERD (gastroesophageal reflux disease)    HLD (hyperlipidemia)    HTN (hypertension)    IBS (irritable bowel syndrome)    Insomnia    MRSA infection    Osteoarthritis    Osteopenia    PONV (postoperative nausea and vomiting)    RA (rheumatoid arthritis) (HCC)    Rheumatoid arthritis (HCC)    Seasonal allergies     Shingles    Status post dilation of esophageal narrowing    Vitamin D  deficiency      Social History   Tobacco Use   Smoking status: Never    Passive exposure: Never   Smokeless tobacco: Never  Vaping Use   Vaping status: Never Used  Substance Use Topics   Alcohol  use: Yes    Alcohol /week: 7.0 standard drinks of alcohol     Types: 7 Standard drinks or equivalent per week    Comment: wine daily   Drug use: No    Comment: takes CBD oil    Courtney Crane reports that she has never smoked. She has never been exposed to tobacco smoke. She has never used smokeless tobacco. She reports current alcohol  use of about 7.0 standard drinks of alcohol  per week. She reports that she does not use drugs.  Tobacco Cessation: Never smoker    Past surgical hx, Family hx, Social hx all reviewed.  Current Outpatient Medications on File Prior to Visit  Medication Sig   acetaminophen  (TYLENOL ) 500 MG tablet Take 1,000 mg by mouth every 6 (six) hours as needed (pain.).   albuterol  (VENTOLIN  HFA) 108 (90 Base) MCG/ACT inhaler TAKE 2 PUFFS BY MOUTH EVERY 6 HOURS AS NEEDED FOR WHEEZE OR SHORTNESS OF BREATH   Albuterol -Budesonide (AIRSUPRA ) 90-80 MCG/ACT AERO Inhale 2 puffs into the lungs every 4 (four) hours as needed.   amLODipine  (NORVASC ) 10 MG tablet Take 10 mg by mouth in the morning.   ascorbic acid  (VITAMIN C) 100 MG tablet Take by mouth.   benazepril-hydrochlorthiazide (LOTENSIN HCT) 10-12.5 MG tablet    bismuth subsalicylate (PEPTO BISMOL) 262 MG chewable tablet Chew 262-524 mg by mouth 3 (three) times daily as needed for diarrhea or loose stools or indigestion.   budeson-glycopyrrolate-formoterol (BREZTRI  AEROSPHERE) 160-9-4.8 MCG/ACT AERO inhaler Inhale 2 puffs into the lungs in the morning and at bedtime.   Calcium  Carb-Cholecalciferol  (CALCIUM  600+D3 PO) Take 1 tablet by mouth in the morning.   calcium  elemental as carbonate (TUMS ULTRA 1000) 400 MG chewable tablet Chew 1,000 mg by mouth 3  (three) times daily as needed for heartburn.   CANNABIDIOL PO Take 1 capsule by mouth at bedtime. CBD   Cholecalciferol  (VITAMIN D -3) 125 MCG (5000 UT) TABS Take 5,000 Units by mouth  in the morning.   COMIRNATY syringe Inject 0.3 mLs into the muscle once.   Dupilumab  (DUPIXENT ) 300 MG/2ML SOAJ Inject 300 mg into the skin every 14 (fourteen) days.   famotidine  (PEPCID ) 40 MG tablet TAKE 1 TABLET BY MOUTH EVERYDAY AT BEDTIME   Ferrous Sulfate (CVS SLOW RELEASE IRON PO) Take 65 mg by mouth 3 (three) times a week.   fexofenadine  (ALLEGRA ) 180 MG tablet Take 0.5 tablets (90 mg total) by mouth in the morning. (Patient taking differently: Take 90 mg by mouth in the morning. Patient gets OTC, please do not send it to pharmacy.)   Fluticasone Propionate  (XHANCE ) 93 MCG/ACT EXHU Place 2 sprays into the nose 2 (two) times daily as needed.   FLUZONE HIGH-DOSE 0.5 ML injection Inject 0.5 mLs into the muscle once.   hydrochlorothiazide  (HYDRODIURIL ) 12.5 MG tablet Take 12.5 mg by mouth every morning.   hydrocortisone  2.5 % cream Apply 1 Application topically as needed.   inFLIXimab  (REMICADE ) 100 MG injection Inject into the vein every 5 (five) weeks.   ipratropium (ATROVENT ) 0.06 % nasal spray Place 1 spray into both nostrils daily.   leflunomide (ARAVA) 20 MG tablet Take 20 mg by mouth every evening.   losartan  (COZAAR ) 100 MG tablet Take 100 mg by mouth daily.   Magnesium 250 MG TABS Take 250 mg by mouth every evening.   MELATONIN PO Take 1.5 mg by mouth at bedtime as needed (sleep).   meloxicam (MOBIC) 15 MG tablet Take 15 mg by mouth as needed.   mirabegron  ER (MYRBETRIQ ) 25 MG TB24 tablet Take 1 tablet (25 mg total) by mouth daily.   Multiple Vitamins-Minerals (IMMUNE SUPPORT VITAMIN C PO) Take 1 tablet by mouth in the morning. With Vitamin D  3/Vitamin A/Magnesium   Olopatadine  HCl (PATADAY  OP) Apply 1 drop to eye as needed.   Omega-3 Fatty Acids (FISH OIL) 1200 MG CAPS Take 1,200 mg by mouth 2 (two)  times daily.   omeprazole  (PRILOSEC) 40 MG capsule TAKE 1 CAPSULE (40 MG TOTAL) BY MOUTH 2 (TWO) TIMES DAILY BEFORE A MEAL.   polyethylene glycol (MIRALAX  / GLYCOLAX ) 17 g packet Take 17 g by mouth every Monday, Wednesday, and Friday. In the morning   Psyllium (METAMUCIL 4 IN 1 FIBER PO) Take 2 capsules by mouth in the morning. Metamucil 3-in-1 Psyllium Fiber Supplement Capsules   rosuvastatin  (CRESTOR ) 5 MG tablet Take 5 mg by mouth every evening.   simethicone (MYLICON) 125 MG chewable tablet Chew 125 mg by mouth every 6 (six) hours as needed for flatulence.   sodium chloride  (OCEAN) 0.65 % SOLN nasal spray Place 1 spray into both nostrils in the morning and at bedtime.   temazepam (RESTORIL) 15 MG capsule Take 15 mg by mouth at bedtime as needed for sleep.   valACYclovir  (VALTREX ) 500 MG tablet Take 500 mg by mouth daily as needed (outbreaks).   No current facility-administered medications on file prior to visit.     Allergies  Allergen Reactions   Meperidine Nausea Only   Methotrexate Other (See Comments)    Unknown reaction   Nickel Rash   Tape Rash and Other (See Comments)    BP (!) 150/80 (BP Location: Left Arm, Patient Position: Sitting, Cuff Size: Large)   Pulse 80   Temp 98.5 F (36.9 C) (Oral)   Ht 5' 2 (1.575 m)   Wt 132 lb 6.4 oz (60.1 kg)   SpO2 97%   BMI 24.22 kg/m    Review of Systems:  Gen:  Denies  fever, sweats, chills weight loss  HEENT: Denies blurred vision, double vision, ear pain, eye pain, hearing loss, nose bleeds, sore throat Cardiac:  No dizziness, chest pain or heaviness, chest tightness,edema, No JVD Resp:  +cough, -sputum production, +shortness of breath,+wheezing, -hemoptysis,  Other:  All other systems negative   Physical Examination:   General Appearance: No distress  EYES PERRLA, EOM intact.   NECK Supple, No JVD Pulmonary: normal breath sounds, No wheezing.  CardiovascularNormal S1,S2.  No m/r/g.   Abdomen: Benign, Soft,  non-tender. Neurology UE/LE 5/5 strength, no focal deficits Ext pulses intact, cap refill intact ALL OTHER ROS ARE NEGATIVE   Assessment/Plan 73 year old pleasant white female seen today for moderate to severe persistent asthma symptoms with possible underlying angina with a feeling of elephant on her chest associated with nausea anytime she exerts herself  Assessment of asthma Her current FeNo is 15 which does not suggest active inflammation however we will continue with Breztri  inhaler as well as Dupixent  injections Continue Airsupra  as needed Avoid Allergens and Irritants Avoid secondhand smoke Avoid SICK contacts Recommend  Masking  when appropriate Recommend Keep up-to-date with vaccinations Ambulating pulse oximetry in the office today did not reveal hypoxia Obtain chest x-ray    Angina elephant on her chest Recommend referral to cardiology in [facility] as patient request     MEDICATION ADJUSTMENTS/LABS AND TESTS ORDERED: Continue medications for asthma Obtain chest x-ray Cardiology referral for angina Avoid Allergens and Irritants Avoid secondhand smoke Avoid SICK contacts Recommend  Masking  when appropriate Recommend Keep up-to-date with vaccinations DuoNebs every 4 hours as needed  MEDICATION ADJUSTMENTS/LABS AND TESTS ORDERED:    CURRENT MEDICATIONS REVIEWED AT LENGTH WITH PATIENT TODAY   Patient  satisfied with Plan of action and management. All questions answered   Follow up 4 weeks  I spent a total of 45 minutes dedicated to the care of this patient on the date of this encounter to include pre-visit review of records, face-to-face time with the patient discussing conditions above, post visit ordering of testing, clinical documentation with the electronic health record, making appropriate referrals as documented, and communicating necessary information to the patient's healthcare team.    The Patient requires high complexity decision making for  assessment and support, frequent evaluation and titration of therapies, application of advanced monitoring technologies and extensive interpretation of multiple databases.  Patient satisfied with Plan of action and management. All questions answered    Nickolas Alm Cellar, M.D.  [facility]  Medical Director [facility] Medical Director [facility] Cardio-Pulmonary Department

## 2024-05-19 DIAGNOSIS — J4550 Severe persistent asthma, uncomplicated: Secondary | ICD-10-CM | POA: Diagnosis not present

## 2024-06-04 DIAGNOSIS — M0579 Rheumatoid arthritis with rheumatoid factor of multiple sites without organ or systems involvement: Secondary | ICD-10-CM | POA: Diagnosis not present

## 2024-06-04 DIAGNOSIS — Z79899 Other long term (current) drug therapy: Secondary | ICD-10-CM | POA: Diagnosis not present

## 2024-06-04 DIAGNOSIS — R5383 Other fatigue: Secondary | ICD-10-CM | POA: Diagnosis not present

## 2024-06-08 ENCOUNTER — Encounter (INDEPENDENT_AMBULATORY_CARE_PROVIDER_SITE_OTHER): Payer: Self-pay

## 2024-06-08 ENCOUNTER — Other Ambulatory Visit: Payer: Self-pay

## 2024-06-08 DIAGNOSIS — N3281 Overactive bladder: Secondary | ICD-10-CM | POA: Insufficient documentation

## 2024-06-08 DIAGNOSIS — M48061 Spinal stenosis, lumbar region without neurogenic claudication: Secondary | ICD-10-CM | POA: Insufficient documentation

## 2024-06-08 DIAGNOSIS — D84821 Immunodeficiency due to drugs: Secondary | ICD-10-CM | POA: Insufficient documentation

## 2024-06-08 DIAGNOSIS — E041 Nontoxic single thyroid nodule: Secondary | ICD-10-CM | POA: Insufficient documentation

## 2024-06-08 DIAGNOSIS — I700 Atherosclerosis of aorta: Secondary | ICD-10-CM | POA: Insufficient documentation

## 2024-06-08 DIAGNOSIS — E1169 Type 2 diabetes mellitus with other specified complication: Secondary | ICD-10-CM | POA: Insufficient documentation

## 2024-06-08 NOTE — Progress Notes (Signed)
 Specialty Pharmacy Refill Coordination Note  Courtney Crane is a 73 y.o. female contacted today regarding refills of specialty medication(s) Dupilumab  (Dupixent )   Patient requested (Patient-Rptd) Delivery   Delivery date: 06/10/24   Verified address: (Patient-Rptd) [address] [place], [place] [address]   Medication will be filled on 07.22.25.

## 2024-06-09 ENCOUNTER — Other Ambulatory Visit: Payer: Self-pay

## 2024-06-09 ENCOUNTER — Ambulatory Visit

## 2024-06-09 VITALS — BP 142/72 | HR 74 | Ht 62.0 in | Wt 132.6 lb

## 2024-06-09 DIAGNOSIS — Z8719 Personal history of other diseases of the digestive system: Secondary | ICD-10-CM | POA: Insufficient documentation

## 2024-06-09 DIAGNOSIS — M1990 Unspecified osteoarthritis, unspecified site: Secondary | ICD-10-CM | POA: Insufficient documentation

## 2024-06-09 DIAGNOSIS — K5790 Diverticulosis of intestine, part unspecified, without perforation or abscess without bleeding: Secondary | ICD-10-CM | POA: Insufficient documentation

## 2024-06-09 DIAGNOSIS — E782 Mixed hyperlipidemia: Secondary | ICD-10-CM | POA: Diagnosis not present

## 2024-06-09 DIAGNOSIS — F419 Anxiety disorder, unspecified: Secondary | ICD-10-CM | POA: Insufficient documentation

## 2024-06-09 DIAGNOSIS — K635 Polyp of colon: Secondary | ICD-10-CM | POA: Insufficient documentation

## 2024-06-09 DIAGNOSIS — K760 Fatty (change of) liver, not elsewhere classified: Secondary | ICD-10-CM | POA: Insufficient documentation

## 2024-06-09 DIAGNOSIS — J45909 Unspecified asthma, uncomplicated: Secondary | ICD-10-CM | POA: Insufficient documentation

## 2024-06-09 DIAGNOSIS — Z9889 Other specified postprocedural states: Secondary | ICD-10-CM | POA: Insufficient documentation

## 2024-06-09 DIAGNOSIS — E1169 Type 2 diabetes mellitus with other specified complication: Secondary | ICD-10-CM | POA: Diagnosis not present

## 2024-06-09 DIAGNOSIS — E559 Vitamin D deficiency, unspecified: Secondary | ICD-10-CM | POA: Diagnosis not present

## 2024-06-09 DIAGNOSIS — K589 Irritable bowel syndrome without diarrhea: Secondary | ICD-10-CM | POA: Insufficient documentation

## 2024-06-09 DIAGNOSIS — A4902 Methicillin resistant Staphylococcus aureus infection, unspecified site: Secondary | ICD-10-CM | POA: Insufficient documentation

## 2024-06-09 DIAGNOSIS — K219 Gastro-esophageal reflux disease without esophagitis: Secondary | ICD-10-CM | POA: Diagnosis not present

## 2024-06-09 DIAGNOSIS — H269 Unspecified cataract: Secondary | ICD-10-CM | POA: Insufficient documentation

## 2024-06-09 DIAGNOSIS — R079 Chest pain, unspecified: Secondary | ICD-10-CM | POA: Diagnosis not present

## 2024-06-09 DIAGNOSIS — I10 Essential (primary) hypertension: Secondary | ICD-10-CM | POA: Diagnosis not present

## 2024-06-09 DIAGNOSIS — D509 Iron deficiency anemia, unspecified: Secondary | ICD-10-CM | POA: Diagnosis not present

## 2024-06-09 DIAGNOSIS — T7840XA Allergy, unspecified, initial encounter: Secondary | ICD-10-CM | POA: Insufficient documentation

## 2024-06-09 DIAGNOSIS — E785 Hyperlipidemia, unspecified: Secondary | ICD-10-CM | POA: Diagnosis not present

## 2024-06-09 DIAGNOSIS — E041 Nontoxic single thyroid nodule: Secondary | ICD-10-CM | POA: Diagnosis not present

## 2024-06-09 DIAGNOSIS — B029 Zoster without complications: Secondary | ICD-10-CM | POA: Insufficient documentation

## 2024-06-09 DIAGNOSIS — T8859XA Other complications of anesthesia, initial encounter: Secondary | ICD-10-CM | POA: Insufficient documentation

## 2024-06-09 HISTORY — DX: Chest pain, unspecified: R07.9

## 2024-06-09 MED ORDER — NITROGLYCERIN 0.4 MG SL SUBL: 0.4000 mg | SUBLINGUAL_TABLET | SUBLINGUAL | 6 refills | Status: AC | PRN

## 2024-06-09 NOTE — Patient Instructions (Signed)
 Medication Instructions:  Your physician has recommended you make the following change in your medication:   Take 81 mg coated aspirin  daily.  Use nitroglycerin  1 tablet placed under the tongue at the first sign of chest pain or an angina attack. 1 tablet may be used every 5 minutes as needed, for up to 15 minutes. Do not take more than 3 tablets in 15 minutes. If pain persist call 911 or go to the nearest ED.   *If you need a refill on your cardiac medications before your next appointment, please call your pharmacy*   Lab Work: Your physician recommends that you have a BMET and CBC today in the office for your upcoming procedure.  If you have labs (blood work) drawn today and your tests are completely normal, you will receive your results only by: MyChart Message (if you have MyChart) OR A paper copy in the mail If you have any lab test that is abnormal or we need to change your treatment, we will call you to review the results.   Testing/Procedures:  [facility] AT [address] [place] [address] Dept: [phone] Loc: [phone]  Courtney Crane  06/09/2024  You are scheduled for a Cardiac Catheterization on Thursday, July 24 with Dr. Newman Lawrence.  1. Please arrive at the [facility] (Main Entrance A) at [facility]: [address] [place], [place] [address] at 10:00 AM (This time is 2 hour(s) before your procedure to ensure your preparation).   Free valet parking service is available. You will check in at ADMITTING. The support person will be asked to wait in the waiting room.  It is OK to have someone drop you off and come back when you are ready to be discharged.    Special note: Every effort is made to have your procedure done on time. Please understand that emergencies sometimes delay scheduled procedures.  2. Diet: Do not eat solid foods after midnight.  The  patient may have clear liquids until 5am upon the day of the procedure.  3. Labs: You had labs today in the office  4. Medication instructions in preparation for your procedure:   Contrast Allergy: No  Stop taking, Cozaar  (Losartan ) Thursday, July 24,, Benazepril (Lotensin) Thursday, July 24,, HTCZ (Hydrochlorothiazide ) Thursday, July 24,   On the morning of your procedure, take your Aspirin  81 mg and any morning medicines NOT listed above.  You may use sips of water .  5. Plan to go home the same day, you will only stay overnight if medically necessary. 6. Bring a current list of your medications and current insurance cards. 7. You MUST have a responsible person to drive you home. 8. Someone MUST be with you the first 24 hours after you arrive home or your discharge will be delayed. 9. Please wear clothes that are easy to get on and off and wear slip-on shoes.  Thank you for allowing us  to care for you!   --  Invasive Cardiovascular services  Your physician has requested that you have an echocardiogram. Echocardiography is a painless test that uses sound waves to create images of your heart. It provides your doctor with information about the size and shape of your heart and how well your heart's chambers and valves are working. This procedure takes approximately one hour. There are no restrictions for this procedure. Please do NOT wear cologne, perfume, aftershave, or lotions (deodorant is  allowed). Please arrive 15 minutes prior to your appointment time.  Please note: We ask at that you not bring children with you during ultrasound (echo/ vascular) testing. Due to room size and safety concerns, children are not allowed in the ultrasound rooms during exams. Our front office staff cannot provide observation of children in our lobby area while testing is being conducted. An adult accompanying a patient to their appointment will only be allowed in the ultrasound room at the  discretion of the ultrasound technician under special circumstances. We apologize for any inconvenience.   Follow-Up: At [facility], you and your health needs are our priority.  As part of our continuing mission to provide you with exceptional heart care, we have created designated Provider Care Teams.  These Care Teams include your primary Cardiologist (physician) and Advanced Practice Providers (APPs -  Physician Assistants and Nurse Practitioners) who all work together to provide you with the care you need, when you need it.  We recommend signing up for the patient portal called MyChart.  Sign up information is provided on this After Visit Summary.  MyChart is used to connect with patients for Virtual Visits (Telemedicine).  Patients are able to view lab/test results, encounter notes, upcoming appointments, etc.  Non-urgent messages can be sent to your provider as well.   To learn more about what you can do with MyChart, go to [email or web address].    Your next appointment:   4 week(s)  The format for your next appointment:   In Person  Provider:   Alean Kobus, MD   Other Instructions  Coronary Angiogram With Stent Coronary angiogram with stent placement is a procedure to widen or open a narrow blood vessel of the heart (coronary artery). Arteries may become blocked by cholesterol buildup (plaques) in the lining of the artery wall. When a coronary artery becomes partially blocked, blood flow to that area decreases. This may lead to chest pain or a heart attack (myocardial infarction). A stent is a small piece of metal that looks like mesh or spring. Stent placement may be done as treatment after a heart attack, or to prevent a heart attack if a blocked artery is found by a coronary angiogram. Let your health care provider know about: Any allergies you have, including allergies to medicines or contrast dye. All medicines you are taking, including vitamins, herbs, eye  drops, creams, and over-the-counter medicines. Any problems you or family members have had with anesthetic medicines. Any blood disorders you have. Any surgeries you have had. Any medical conditions you have, including kidney problems or kidney failure. Whether you are pregnant or may be pregnant. Whether you are breastfeeding. What are the risks? Generally, this is a safe procedure. However, serious problems may occur, including: Damage to nearby structures or organs, such as the heart, blood vessels, or kidneys. A return of blockage. Bleeding, infection, or bruising at the insertion site. A collection of blood under the skin (hematoma) at the insertion site. A blood clot in another part of the body. Allergic reaction to medicines or dyes. Bleeding into the abdomen (retroperitoneal bleeding). Stroke (rare). Heart attack (rare). What happens before the procedure? Staying hydrated Follow instructions from your health care provider about hydration, which may include: Up to 2 hours before the procedure - you may continue to drink clear liquids, such as water , clear fruit juice, black coffee, and plain tea.    Eating and drinking restrictions Follow instructions from your health care provider about eating and drinking, which  may include: 8 hours before the procedure - stop eating heavy meals or foods, such as meat, fried foods, or fatty foods. 6 hours before the procedure - stop eating light meals or foods, such as toast or cereal. 2 hours before the procedure - stop drinking clear liquids. Medicines Ask your health care provider about: Changing or stopping your regular medicines. This is especially important if you are taking diabetes medicines or blood thinners. Taking medicines such as aspirin  and ibuprofen. These medicines can thin your blood. Do not take these medicines unless your health care provider tells you to take them. Generally, aspirin  is recommended before a thin tube,  called a catheter, is passed through a blood vessel and inserted into the heart (cardiac catheterization). Taking over-the-counter medicines, vitamins, herbs, and supplements. General instructions Do not use any products that contain nicotine or tobacco for at least 4 weeks before the procedure. These products include cigarettes, e-cigarettes, and chewing tobacco. If you need help quitting, ask your health care provider. Plan to have someone take you home from the hospital or clinic. If you will be going home right after the procedure, plan to have someone with you for 24 hours. You may have tests and imaging procedures. Ask your health care provider: How your insertion site will be marked. Ask which artery will be used for the procedure. What steps will be taken to help prevent infection. These may include: Removing hair at the insertion site. Washing skin with a germ-killing soap. Taking antibiotic medicine. What happens during the procedure? An IV will be inserted into one of your veins. Electrodes may be placed on your chest to monitor your heart rate during the procedure. You will be given one or more of the following: A medicine to help you relax (sedative). A medicine to numb the area (local anesthetic) for catheter insertion. A small incision will be made for catheter insertion. The catheter will be inserted into an artery using a guide wire. The location may be in your groin, your wrist, or the fold of your arm (near your elbow). An X-ray procedure (fluoroscopy) will be used to help guide the catheter to the opening of the heart arteries. A dye will be injected into the catheter. X-rays will be taken. The dye helps to show where any narrowing or blockages are located in the arteries. Tell your health care provider if you have chest pain or trouble breathing. A tiny wire will be guided to the blocked spot, and a balloon will be inflated to make the artery wider. The stent will be  expanded to crush the plaques into the wall of the vessel. The stent will hold the area open and improve the blood flow. Most stents have a drug coating to reduce the risk of the stent narrowing over time. The artery may be made wider using a drill, laser, or other tools that remove plaques. The catheter will be removed when the blood flow improves. The stent will stay where it was placed, and the lining of the artery will grow over it. A bandage (dressing) will be placed on the insertion site. Pressure will be applied to stop bleeding. The IV will be removed. This procedure may vary among health care providers and hospitals.    What happens after the procedure? Your blood pressure, heart rate, breathing rate, and blood oxygen level will be monitored until you leave the hospital or clinic. If the procedure is done through the leg, you will lie flat in bed for  a few hours or for as long as told by your health care provider. You will be instructed not to bend or cross your legs. The insertion site and the pulse in your foot or wrist will be checked often. You may have more blood tests, X-rays, and a test that records the electrical activity of your heart (electrocardiogram, or ECG). Do not drive for 24 hours if you were given a sedative during your procedure. Summary Coronary angiogram with stent placement is a procedure to widen or open a narrowed coronary artery. This is done to treat heart problems. Before the procedure, let your health care provider know about all the medical conditions and surgeries you have or have had. This is a safe procedure. However, some problems may occur, including damage to nearby structures or organs, bleeding, blood clots, or allergies. Follow your health care provider's instructions about eating, drinking, medicines, and other lifestyle changes, such as quitting tobacco use before the procedure. This information is not intended to replace advice given to you by your  health care provider. Make sure you discuss any questions you have with your health care provider. Document Revised: 05/27/2019 Document Reviewed: 05/27/2019 Elsevier Patient Education  2021 Elsevier Inc.  Aspirin  and Your Heart Aspirin  is a medicine that prevents the platelets in your blood from sticking together. Platelets are the cells that your blood uses for clotting. Aspirin  can be used to help reduce the risk of blood clots, heart attacks, and other heart-related problems. What are the risks? Daily use of aspirin  can cause side effects. Some of these include: Bleeding. Bleeding can be minor or serious. An example of minor bleeding is bleeding from a cut, and the bleeding does not stop. An example of more serious bleeding is stomach bleeding or, rarely, bleeding into the brain. Your risk of bleeding increases if you are also taking NSAIDs, such as ibuprofen. Increased bruising. Upset stomach. An allergic reaction. People who have growths inside the nose (nasal polyps) have an increased risk of developing an aspirin  allergy. How to use aspirin  to care for your heart Take aspirin  only as told by your health care provider. Make sure that you understand how much to take and what form to take. The two forms of aspirin  are: Non-enteric-coated.This type of aspirin  does not have a coating and is absorbed quickly. This type of aspirin  also comes in a chewable form. Enteric-coated. This type of aspirin  has a coating that releases the medicine very slowly. Enteric-coated aspirin  might cause less stomach upset than non-enteric-coated aspirin . This type of aspirin  should not be chewed or crushed. Work with your health care provider to find out whether it is safe and beneficial for you to take aspirin  daily. Taking aspirin  daily may be helpful if: You have had a heart attack or chest pain, or you are at risk for a heart attack. You have a condition in which certain heart vessels are blocked (coronary  artery disease), and you have had a procedure to treat it. Examples are: Open-heart surgery, such as coronary artery bypass surgery (CABG). Coronary angioplasty,which is done to widen a blood vessel of your heart. Having a small mesh tube, or stent, placed in your coronary artery. You have had certain types of stroke or a mini-stroke known as a transient ischemic attack (TIA). You have a narrowing of the arteries that supply the limbs (peripheral artery disease, or PAD). You have long-term (chronic) heart rhythm problems, such as atrial fibrillation, and your health care provider thinks aspirin   may help. You have valve disease or have had surgery on a valve. You are considered at increased risk of developing coronary artery disease or PAD.    Follow these instructions at home Medicines Take over-the-counter and prescription medicines only as told by your health care provider. If you are taking blood thinners: Talk with your health care provider before you take any medicines that contain aspirin  or NSAIDs, such as ibuprofen. These medicines increase your risk for dangerous bleeding. Take your medicine exactly as told, at the same time every day. Avoid activities that could cause injury or bruising, and follow instructions about how to prevent falls. Wear a medical alert bracelet or carry a card that lists what medicines you take. General instructions Do not drink alcohol  if: Your health care provider tells you not to drink. You are pregnant, may be pregnant, or are planning to become pregnant. If you drink alcohol : Limit how much you use to: 0-1 drink a day for women. 0-2 drinks a day for men. Be aware of how much alcohol  is in your drink. In the [place] one drink equals one 12 oz bottle of beer (355 mL), one 5 oz glass of wine (148 mL), or one 1 oz glass of hard liquor (44 mL). Keep all follow-up visits as told by your health care provider. This is important. Where to find more  information The American Heart Association: www.heart.org Contact a health care provider if you have: Unusual bleeding or bruising. Stomach pain or nausea. Ringing in your ears. An allergic reaction that causes hives, itchy skin, or swelling of the lips, tongue, or face. Get help right away if: You notice that your bowel movements are bloody, or dark red or black in color. You vomit or cough up blood. You have blood in your urine. You cough, breathe loudly (wheeze), or feel short of breath. You have chest pain, especially if the pain spreads to your arms, back, neck, or jaw. You have a headache with confusion. You have any symptoms of a stroke. BE FAST is an easy way to remember the main warning signs of a stroke: B - Balance. Signs are dizziness, sudden trouble walking, or loss of balance. E - Eyes. Signs are trouble seeing or a sudden change in vision. F - Face. Signs are sudden weakness or numbness of the face, or the face or eyelid drooping on one side. A - Arms. Signs are weakness or numbness in an arm. This happens suddenly and usually on one side of the body. S - Speech. Signs are sudden trouble speaking, slurred speech, or trouble understanding what people say. T - Time. Time to call emergency services. Write down what time symptoms started. You have other signs of a stroke, such as: A sudden, severe headache with no known cause. Nausea or vomiting. Seizure. These symptoms may represent a serious problem that is an emergency. Do not wait to see if the symptoms will go away. Get medical help right away. Call your local emergency services (911 in the [place] Do not drive yourself to the hospital. Summary Aspirin  use can help reduce the risk of blood clots, heart attacks, and other heart-related problems. Daily use of aspirin  can cause side effects. Take aspirin  only as told by your health care provider. Make sure that you understand how much to take and what form to take. Your  health care provider will help you determine whether it is safe and beneficial for you to take aspirin  daily. This information is not  intended to replace advice given to you by your health care provider. Make sure you discuss any questions you have with your health care provider. Document Revised: 08/10/2019 Document Reviewed: 08/10/2019 Elsevier Patient Education  2021 Elsevier Inc. Nitroglycerin  sublingual tablets What is this medicine? NITROGLYCERIN  (nye troe GLI ser in) is a type of vasodilator. It relaxes blood vessels, increasing the blood and oxygen supply to your heart. This medicine is used to relieve chest pain caused by angina. It is also used to prevent chest pain before activities like climbing stairs, going outdoors in cold weather, or sexual activity. This medicine may be used for other purposes; ask your health care provider or pharmacist if you have questions. COMMON BRAND NAME(S): Nitroquick, Nitrostat , Nitrotab What should I tell my health care provider before I take this medicine? They need to know if you have any of these conditions: anemia head injury, recent stroke, or bleeding in the brain liver disease previous heart attack an unusual or allergic reaction to nitroglycerin , other medicines, foods, dyes, or preservatives pregnant or trying to get pregnant breast-feeding How should I use this medicine? Take this medicine by mouth as needed. Use at the first sign of an angina attack (chest pain or tightness). You can also take this medicine 5 to 10 minutes before an event likely to produce chest pain. Follow the directions exactly as written on the prescription label. Place one tablet under your tongue and let it dissolve. Do not swallow whole. Replace the dose if you accidentally swallow it. It will help if your mouth is not dry. Saliva around the tablet will help it to dissolve more quickly. Do not eat or drink, smoke or chew tobacco while a tablet is dissolving. Sit down  when taking this medicine. In an angina attack, you should feel better within 5 minutes after your first dose. You can take a dose every 5 minutes up to a total of 3 doses. If you do not feel better or feel worse after 1 dose, call 9-1-1 at once. Do not take more than 3 doses in 15 minutes. Your health care provider might give you other directions. Follow those directions if he or she does. Do not take your medicine more often than directed. Talk to your health care provider about the use of this medicine in children. Special care may be needed. Overdosage: If you think you have taken too much of this medicine contact a poison control center or emergency room at once. NOTE: This medicine is only for you. Do not share this medicine with others. What if I miss a dose? This does not apply. This medicine is only used as needed. What may interact with this medicine? Do not take this medicine with any of the following medications: certain migraine medicines like ergotamine and dihydroergotamine (DHE) medicines used to treat erectile dysfunction like sildenafil, tadalafil, and vardenafil riociguat This medicine may also interact with the following medications: alteplase aspirin  heparin  medicines for high blood pressure medicines for mental depression other medicines used to treat angina phenothiazines like chlorpromazine, mesoridazine, prochlorperazine, thioridazine This list may not describe all possible interactions. Give your health care provider a list of all the medicines, herbs, non-prescription drugs, or dietary supplements you use. Also tell them if you smoke, drink alcohol , or use illegal drugs. Some items may interact with your medicine. What should I watch for while using this medicine? Tell your doctor or health care professional if you feel your medicine is no longer working. Keep this medicine  with you at all times. Sit or lie down when you take your medicine to prevent falling if you  feel dizzy or faint after using it. Try to remain calm. This will help you to feel better faster. If you feel dizzy, take several deep breaths and lie down with your feet propped up, or bend forward with your head resting between your knees. You may get drowsy or dizzy. Do not drive, use machinery, or do anything that needs mental alertness until you know how this drug affects you. Do not stand or sit up quickly, especially if you are an older patient. This reduces the risk of dizzy or fainting spells. Alcohol  can make you more drowsy and dizzy. Avoid alcoholic drinks. Do not treat yourself for coughs, colds, or pain while you are taking this medicine without asking your doctor or health care professional for advice. Some ingredients may increase your blood pressure. What side effects may I notice from receiving this medicine? Side effects that you should report to your doctor or health care professional as soon as possible: allergic reactions (skin rash, itching or hives; swelling of the face, lips, or tongue) low blood pressure (dizziness; feeling faint or lightheaded, falls; unusually weak or tired) low red blood cell counts (trouble breathing; feeling faint; lightheaded, falls; unusually weak or tired) Side effects that usually do not require medical attention (report to your doctor or health care professional if they continue or are bothersome): facial flushing (redness) headache nausea, vomiting This list may not describe all possible side effects. Call your doctor for medical advice about side effects. You may report side effects to FDA at 1-800-FDA-1088. Where should I keep my medicine? Keep out of the reach of children. Store at room temperature between 20 and 25 degrees C (68 and 77 degrees F). Store in Retail buyer. Protect from light and moisture. Keep tightly closed. Throw away any unused medicine after the expiration date. NOTE: This sheet is a summary. It may not cover all  possible information. If you have questions about this medicine, talk to your doctor, pharmacist, or health care provider.  2021 Elsevier/Gold Standard (2018-08-06 16:46:32)

## 2024-06-09 NOTE — Assessment & Plan Note (Signed)
 Lipid panel from 05/29/2023 total cholesterol 198, HDL 72, LDL 93, triglycerides 164. Continue rosuvastatin  5 mg once daily. After the cardiac cath if any significant disease, will intensify the regimen.

## 2024-06-09 NOTE — Assessment & Plan Note (Addendum)
 Chest pain on exertion unstable angina Like quality based on progressive symptoms over the past month and a half.  In the setting advised her to avoid any moderate to heavy exertion. Will expedite her workup.  Start aspirin  81 mg once daily. If any significant worsening of symptoms should head to the ER or call 911 right away.  Will schedule her for cardiac cath at the earliest at [facility] in [place].  She is familiar with the procedure.  Went over correct again in detail. Shared Decision Making/Informed Consent{ The risks [stroke (1 in 1000), death (1 in 1000), kidney failure [usually temporary] (1 in 500), bleeding (1 in 200), allergic reaction [possibly serious] (1 in 200)], benefits (diagnostic support and management of coronary artery disease) and alternatives of a cardiac catheterization were discussed in detail with her and she is willing to proceed.  Will also prescribe sublingual nitroglycerin  to use as needed.    Differential diagnosis is symptoms related to uncontrolled blood pressures versus anxiety and stress versus microvascular dysfunction versus noncardiac chest pain musculoskeletal versus flareup of rheumatoid arthritis.  Also obtain transthoracic echocardiogram to rule out any significant cardiac structural and functional abnormalities.

## 2024-06-09 NOTE — Progress Notes (Signed)
 Cardiology Consultation:    Date:  06/09/2024   ID:  Courtney Crane, DOB 10-07-1951, MRN [id number]  PCP:  [name], MD  Cardiologist:  Alean SAUNDERS Billi Bright, MD   Referring MD: Isaiah Scrivener, MD   No chief complaint on file.    ASSESSMENT AND PLAN:   [name] 73 year old woman  history of cardiac cath normal coronaries 15 years ago in [place]  [done in the setting of ongoing cardiac chest symptoms and palpitations], asthma, hypertension, hyperlipidemia, rheumatoid arthritis, GERD, irritable bowel syndrome, iron deficiency, diverticulitis, Lexiscan  stress test with nuclear imaging June 2020 showed no ischemia, last echocardiogram 10/10/2021 reported normal LV function with EF 60 to 65%, normal diastolic function without significant valvular abnormalities.   Problem List Items Addressed This Visit     Essential hypertension   Suboptimal. On multiple medications including amlodipine  10 mg once daily Hydrochlorothiazide  12.5 mg once daily Losartan  100 mg once daily. Continue the same. After the CAD workup if blood pressures remain suboptimal we will titrate up hydrochlorothiazide  or add nonselective beta-blocker such as carvedilol.       Hyperlipidemia   Lipid panel from 05/29/2023 total cholesterol 198, HDL 72, LDL 93, triglycerides 164. Continue rosuvastatin  5 mg once daily. After the cardiac cath if any significant disease, will intensify the regimen.       Chest pain on exertion - Primary   Chest pain on exertion unstable angina Like quality based on progressive symptoms over the past month and a half.  In the setting advised her to avoid any moderate to heavy exertion. Will expedite her workup.  Start aspirin  81 mg once daily. If any significant worsening of symptoms should head to the ER or call 911 right away.  Will schedule her for cardiac cath at the earliest at [facility] in [place].  She is familiar with the procedure.  Went over correct  again in detail. Shared Decision Making/Informed Consent{ The risks [stroke (1 in 1000), death (1 in 1000), kidney failure [usually temporary] (1 in 500), bleeding (1 in 200), allergic reaction [possibly serious] (1 in 200)], benefits (diagnostic support and management of coronary artery disease) and alternatives of a cardiac catheterization were discussed in detail with her and she is willing to proceed.  Will also prescribe sublingual nitroglycerin  to use as needed.    Differential diagnosis is symptoms related to uncontrolled blood pressures versus anxiety and stress versus microvascular dysfunction versus noncardiac chest pain musculoskeletal versus flareup of rheumatoid arthritis.  Also obtain transthoracic echocardiogram to rule out any significant cardiac structural and functional abnormalities.      Relevant Orders   EKG 12-Lead (Completed)   Return to clinic in 4 weeks tentatively.   History of Present Illness:    Courtney Crane is a 73 y.o. female who is being seen today for establishing care here with cardiology. Previously followed up with cardiologist at [facility] with Dr. Ladona and seen [name] on 01-17-2022. PCP is Isaiah Scrivener, MD.  Lives near [place].  At home with her husband.  Has history of cardiac cath normal coronaries 15 years ago in [place]  [done in the setting of ongoing cardiac chest symptoms and palpitations], asthma, hypertension, hyperlipidemia, rheumatoid arthritis, GERD, irritable bowel syndrome, iron deficiency, diverticulitis, Lexiscan  stress test with nuclear imaging June 2020 showed no ischemia, last echocardiogram 10/10/2021 reported normal LV function with EF 60 to 65%, normal diastolic function without significant valvular abnormalities.  Mentions over the past month and a half she has been  dealing with symptoms of chest pressure heaviness and shortness of breath with minimal day-to-day exertion.  This has  been significant even when she started the cruise last month.  Has significantly limited her activities at home. No orthopnea.  No pedal edema, no palpitations. No syncopal or near syncopal episodes.  Mentions blood pressures at home typically tend to be elevated with occasional readings of systolic blood pressure up to 814 over diastolic 90 mmHg using her upper arm cuff. Blood pressure readings here in the office today reasonably controlled.  Does not smoke. Drinks 1 glass of wine at night. No other substance use. Good compliance with medications  EKG in the clinic today shows sinus rhythm heart rate 74/min, PR interval 152 ms, QRS duration 68 ms, QTc 432 ms.  No ischemic changes.   Past Medical History:  Diagnosis Date   AKI (acute kidney injury) (HCC) 11/29/2017   Allergy    Anemia    Anxiety    Anxiety disorder 12/06/2014   Arthritis    Asthma    Bloating 09/19/2023   Bursitis of left hip 09/17/2019   Carpal tunnel syndrome of left wrist 04/24/2017   Cataract    Cellulitis and abscess of buttock 11/28/2017   Cholelithiasis with chronic cholecystitis 01/30/2017   Chronic diarrhea 09/19/2023   Claudication (HCC) 04/09/2019   Colon polyps    Complication of anesthesia    sometimes I dont like to wake up after  ie difficult to awaken    Disorder of bone 09/23/2019   Diverticulitis    Diverticulosis    DOE (dyspnea on exertion) 04/09/2019   Dysphagia 01/19/2020   Encounter for general adult medical examination without abnormal findings 01/30/2018   Essential hypertension    Uncontrolled, Norvasc  10 MG daily     Exertional chest pain 04/09/2019   Family history of ischemic heart disease (IHD) 09/14/2015   Fatty liver    Flatulence 09/19/2023   Genital herpes simplex 06/06/2016   GERD (gastroesophageal reflux disease)    Hardening of the aorta (main artery of the heart) (HCC) 06/08/2024   History of right shoulder replacement 12/06/2022   History of shingles  06/06/2016   HTN (hypertension)    Hyperlipidemia 12/06/2014   IBS (irritable bowel syndrome)    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   Insomnia, unspecified    On Melatonin, controlled     Iron deficiency anemia 12/06/2014   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   Leukocytosis 12/04/2017   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   MRSA infection    Muscle weakness 05/11/2020   Myopathy 03/31/2019   Osteoarthritis    Osteopenia    Overactive bladder 06/08/2024   Pain 12/05/2019   Pain involving joint of finger of left hand 04/26/2017   Pain of left lower extremity 08/22/2020   PONV (postoperative nausea and vomiting)    Poorly controlled persistent asthma 05/17/2022   Prediabetes    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   Rheumatoid arthritis (HCC)    Right shoulder pain 12/06/2022   S/P lumbar fusion 01/23/2021   Seasonal allergies    Seborrheic dermatitis    Shingles    Spinal stenosis of lumbar region 06/08/2024   Status post dilation of esophageal narrowing    Strain of muscle, fascia and tendon of the posterior muscle group at thigh level, right thigh, subsequent encounter 08/15/2020   Thyroid  nodule 06/08/2024   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   Type 2 diabetes mellitus with other specified  complication (HCC) 06/08/2024   Uncomplicated asthma 12/06/2014   Vitamin D  deficiency     Past Surgical History:  Procedure Laterality Date   ADENOIDECTOMY     APPENDECTOMY  1975   bladder tack  1990   CATARACT EXTRACTION, BILATERAL Bilateral    CHOLECYSTECTOMY N/A 01/31/2017   Procedure: LAPAROSCOPIC CHOLECYSTECTOMY WITH INTRAOPERATIVE CHOLANGIOGRAM;  Surgeon: Krystal Spinner, MD;  Location: [facility] ORS;  Service: General;  Laterality: N/A;   EYE SURGERY  September 2020   INCISION AND DRAINAGE PERIRECTAL ABSCESS N/A 12/02/2017   Procedure: IRRIGATION AND DEBRIDEMENT PERINEAL ABSCESS;  Surgeon: Tanda Locus, MD;  Location: [facility] OR;  Service:  General;  Laterality: N/A;   JOINT REPLACEMENT  2011, 11-2022   LUMBAR FUSION     MENISCUS REPAIR Right 2013   REVERSE SHOULDER ARTHROPLASTY Right 12/06/2022   Procedure: RIGHT REVERSE SHOULDER ARTHROPLASTY;  Surgeon: Dozier Soulier, MD;  Location: [facility]S;  Service: Orthopedics;  Laterality: Right;   ROTATOR CUFF REPAIR Right 2005   SPINE SURGERY  March 2022   TONSILLECTOMY     as a child   TOTAL ABDOMINAL HYSTERECTOMY  1988   TOTAL KNEE ARTHROPLASTY Left    WRIST RECONSTRUCTION Right 2008    Current Medications: Current Meds  Medication Sig   acetaminophen  (TYLENOL ) 500 MG tablet Take 1,000 mg by mouth every 6 (six) hours as needed for mild pain (pain score 1-3) or moderate pain (pain score 4-6).   albuterol  (2.5 MG/3ML) 0.083% NEBU 3 mL, albuterol  (5 MG/ML) 0.5% NEBU 0.5 mL Inhale 5 mg into the lungs every 6 (six) hours.   Albuterol -Budesonide (AIRSUPRA ) 90-80 MCG/ACT AERO Inhale 2 puffs into the lungs every 4 (four) hours as needed.   amLODipine  (NORVASC ) 10 MG tablet Take 10 mg by mouth in the morning.   ascorbic acid  (VITAMIN C) 100 MG tablet Take 100 mg by mouth daily.   benazepril-hydrochlorthiazide (LOTENSIN HCT) 10-12.5 MG tablet Take 1 tablet by mouth daily.   bismuth subsalicylate (PEPTO BISMOL) 262 MG chewable tablet Chew 262-524 mg by mouth 3 (three) times daily as needed for diarrhea or loose stools or indigestion.   budeson-glycopyrrolate-formoterol (BREZTRI  AEROSPHERE) 160-9-4.8 MCG/ACT AERO inhaler Inhale 2 puffs into the lungs in the morning and at bedtime.   Calcium  Carb-Cholecalciferol  (CALCIUM  600+D3 PO) Take 1 tablet by mouth in the morning.   calcium  elemental as carbonate (TUMS ULTRA 1000) 400 MG chewable tablet Chew 1,000 mg by mouth 3 (three) times daily as needed for heartburn.   CANNABIDIOL PO Take 1 capsule by mouth at bedtime. CBD   Cholecalciferol  (VITAMIN D -3) 125 MCG (5000 UT) TABS Take 5,000 Units by mouth in the morning.   COMIRNATY syringe Inject  0.3 mLs into the muscle once.   Dupilumab  (DUPIXENT ) 300 MG/2ML SOAJ Inject 300 mg into the skin every 14 (fourteen) days.   famotidine  (PEPCID ) 40 MG tablet TAKE 1 TABLET BY MOUTH EVERYDAY AT BEDTIME   Ferrous Sulfate ([employer] SLOW RELEASE IRON PO) Take 65 mg by mouth 3 (three) times a week.   fexofenadine  (ALLEGRA ) 180 MG tablet Take 0.5 tablets (90 mg total) by mouth in the morning.   Fluticasone Propionate  (XHANCE ) 93 MCG/ACT EXHU Place 2 sprays into the nose 2 (two) times daily as needed.   FLUZONE HIGH-DOSE 0.5 ML injection Inject 0.5 mLs into the muscle once.   hydrochlorothiazide  (HYDRODIURIL ) 12.5 MG tablet Take 12.5 mg by mouth every morning.   inFLIXimab  (REMICADE ) 100 MG injection Inject into the vein every 5 (five) weeks.  ipratropium (ATROVENT ) 0.06 % nasal spray Place 1 spray into both nostrils daily.   ipratropium-albuterol  (DUONEB) 0.5-2.5 (3) MG/3ML SOLN Take 3 mLs by nebulization every 4 (four) hours as needed.   leflunomide (ARAVA) 20 MG tablet Take 20 mg by mouth every evening.   losartan  (COZAAR ) 100 MG tablet Take 100 mg by mouth daily.   Magnesium 250 MG TABS Take 250 mg by mouth every evening.   MELATONIN PO Take 1.5 mg by mouth at bedtime as needed (sleep).   meloxicam (MOBIC) 15 MG tablet Take 15 mg by mouth as needed for pain.   mirabegron  ER (MYRBETRIQ ) 25 MG TB24 tablet Take 1 tablet (25 mg total) by mouth daily.   Multiple Vitamins-Minerals (IMMUNE SUPPORT VITAMIN C PO) Take 1 tablet by mouth in the morning. With Vitamin D  3/Vitamin A/Magnesium   Olopatadine  HCl (PATADAY  OP) Apply 1 drop to eye as needed (dry eyes).   Omega-3 Fatty Acids (FISH OIL) 1200 MG CAPS Take 1,200 mg by mouth 2 (two) times daily.   omeprazole  (PRILOSEC) 40 MG capsule TAKE 1 CAPSULE (40 MG TOTAL) BY MOUTH 2 (TWO) TIMES DAILY BEFORE A MEAL.   polyethylene glycol (MIRALAX  / GLYCOLAX ) 17 g packet Take 17 g by mouth every Monday, Wednesday, and Friday. In the morning   Psyllium (METAMUCIL 4 IN 1  FIBER PO) Take 2 capsules by mouth in the morning. Metamucil 3-in-1 Psyllium Fiber Supplement Capsules   rosuvastatin  (CRESTOR ) 5 MG tablet Take 5 mg by mouth every evening.   simethicone (MYLICON) 125 MG chewable tablet Chew 125 mg by mouth every 6 (six) hours as needed for flatulence.   sodium chloride  (OCEAN) 0.65 % SOLN nasal spray Place 1 spray into both nostrils in the morning and at bedtime.   temazepam (RESTORIL) 15 MG capsule Take 15 mg by mouth at bedtime as needed for sleep.   tretinoin (RETIN-A) 0.025 % cream Apply 1 Application topically at bedtime.   valACYclovir  (VALTREX ) 500 MG tablet Take 500 mg by mouth daily as needed (outbreaks).     Allergies:   Meperidine, Methotrexate, Nickel, and Tape   Social History   Socioeconomic History   Marital status: Married    Spouse name: Not on file   Number of children: 2   Years of education: Not on file   Highest education level: Not on file  Occupational History   Occupation: retired  Tobacco Use   Smoking status: Never    Passive exposure: Never   Smokeless tobacco: Never  Vaping Use   Vaping status: Never Used  Substance and Sexual Activity   Alcohol  use: Yes    Alcohol /week: 7.0 standard drinks of alcohol     Types: 7 Standard drinks or equivalent per week    Comment: wine daily   Drug use: No    Comment: takes CBD oil   Sexual activity: Not Currently    Partners: Male    Birth control/protection: Post-menopausal    Comment: Hx HSV, High RISK Medicare  Other Topics Concern   Not on file  Social History Narrative   Retired from [employer]   Lives at home with husband ; one level home   Right handed   Highest level of education:  Some college   Social Drivers of Health   Financial Resource Strain: Not on file  Food Insecurity: Not on file  Transportation Needs: Not on file  Physical Activity: Not on file  Stress: Not on file  Social Connections: Not on file  Family History: The patient's family history  includes Arthritis in her mother; CAD in her brother; Cancer in her mother; Colon polyps in her mother and son; Dementia in her father; Diabetes in her mother; Esophageal cancer in her brother; Hearing loss in her mother; Heart disease in her mother; Hyperlipidemia in her mother; Hypertension in her mother; Kidney cancer in her mother; Kidney disease in her mother. There is no history of Colon cancer, Stomach cancer, Rectal cancer, Prostate cancer, or Pancreatic cancer. ROS:   Please see the history of present illness.    All 14 point review of systems negative except as described per history of present illness.  EKGs/Labs/Other Studies Reviewed:    The following studies were reviewed today:   EKG:  EKG Interpretation Date/Time:  Tuesday June 09 2024 13:29:43 EDT Ventricular Rate:  74 PR Interval:  152 QRS Duration:  68 QT Interval:  390 QTC Calculation: 432 R Axis:   55  Text Interpretation: Normal sinus rhythm Normal ECG When compared with ECG of 04-Dec-2022 14:21, Criteria for Anterior infarct are no longer Present Confirmed by Liborio Hai reddy [phone]) on 06/09/2024 2:07:14 PM    Recent Labs: 10/12/2023: ALT 63; BUN 19; Creatinine 0.88; Potassium 3.7; Sodium 137; TSH 2.922 01/30/2024: Hemoglobin 12.8; Platelet Count 179  Recent Lipid Panel No results found for: CHOL, TRIG, HDL, CHOLHDL, VLDL, LDLCALC, LDLDIRECT  Physical Exam:    VS:  BP (!) 142/72   Pulse 74   Ht 5' 2 (1.575 m)   Wt 132 lb 9.6 oz (60.1 kg)   SpO2 97%   BMI 24.25 kg/m     Wt Readings from Last 3 Encounters:  06/09/24 132 lb 9.6 oz (60.1 kg)  05/14/24 132 lb 6.4 oz (60.1 kg)  03/20/24 132 lb 12.8 oz (60.2 kg)     GENERAL:  Well nourished, well developed in no acute distress NECK: No JVD; No carotid bruits CARDIAC: RRR, S1 and S2 present, no murmurs, no rubs, no gallops CHEST:  Clear to auscultation without rales, wheezing or rhonchi  Extremities: No pitting pedal edema. Pulses  bilaterally symmetric with radial 2+ and dorsalis pedis 2+ NEUROLOGIC:  Alert and oriented x 3  Medication Adjustments/Labs and Tests Ordered: Current medicines are reviewed at length with the patient today.  Concerns regarding medicines are outlined above.  Orders Placed This Encounter  Procedures   EKG 12-Lead   No orders of the defined types were placed in this encounter.   Signed, Hai jess Liborio, MD, MPH, [facility]. 06/09/2024 2:36 PM    [facility]

## 2024-06-09 NOTE — Assessment & Plan Note (Addendum)
 Suboptimal. On multiple medications including amlodipine  10 mg once daily Hydrochlorothiazide  12.5 mg once daily Losartan  100 mg once daily. Continue the same. After the CAD workup if blood pressures remain suboptimal we will titrate up hydrochlorothiazide  or add nonselective beta-blocker such as carvedilol.

## 2024-06-10 ENCOUNTER — Telehealth: Payer: Self-pay | Admitting: *Deleted

## 2024-06-10 NOTE — Telephone Encounter (Signed)
 06/04/24 CMP/CBC results in [facility].

## 2024-06-10 NOTE — Telephone Encounter (Addendum)
 Cardiac Catheterization scheduled at [facility] for: Thursday June 11, 2024 12 noon Arrival time [facility] Main Entrance A at: 10 AM  Nothing to eat after midnight prior to procedure, clear liquids until 5 AM day of procedure.  Medication instructions: -Hold:  Hydrochlorothiazide -AM of procedure -Other usual morning medications can be taken with sips of water  including aspirin  81 mg.  Plan to go home the same day, you will only stay overnight if medically necessary.  You must have responsible adult to drive you home.  Someone must be with you the first 24 hours after you arrive home.  Reviewed procedure instructions with patient.

## 2024-06-11 ENCOUNTER — Encounter (HOSPITAL_COMMUNITY)
Admission: RE | Disposition: A | Discharge status: Home / Self Care | Payer: Self-pay | Source: Home / Self Care | Attending: Cardiology

## 2024-06-11 ENCOUNTER — Ambulatory Visit (HOSPITAL_COMMUNITY)
Admission: RE | Admit: 2024-06-11 | Discharge: 2024-06-11 | Disposition: A | Discharge status: Home / Self Care | Attending: Cardiology | Admitting: Cardiology

## 2024-06-11 ENCOUNTER — Other Ambulatory Visit: Payer: Self-pay

## 2024-06-11 DIAGNOSIS — R079 Chest pain, unspecified: Secondary | ICD-10-CM | POA: Diagnosis present

## 2024-06-11 DIAGNOSIS — Z7982 Long term (current) use of aspirin: Secondary | ICD-10-CM | POA: Diagnosis not present

## 2024-06-11 DIAGNOSIS — I200 Unstable angina: Secondary | ICD-10-CM | POA: Diagnosis not present

## 2024-06-11 DIAGNOSIS — E785 Hyperlipidemia, unspecified: Secondary | ICD-10-CM | POA: Diagnosis not present

## 2024-06-11 DIAGNOSIS — K589 Irritable bowel syndrome without diarrhea: Secondary | ICD-10-CM | POA: Insufficient documentation

## 2024-06-11 DIAGNOSIS — D509 Iron deficiency anemia, unspecified: Secondary | ICD-10-CM | POA: Insufficient documentation

## 2024-06-11 DIAGNOSIS — I10 Essential (primary) hypertension: Secondary | ICD-10-CM | POA: Diagnosis not present

## 2024-06-11 DIAGNOSIS — I209 Angina pectoris, unspecified: Secondary | ICD-10-CM | POA: Insufficient documentation

## 2024-06-11 DIAGNOSIS — Z79899 Other long term (current) drug therapy: Secondary | ICD-10-CM | POA: Diagnosis not present

## 2024-06-11 DIAGNOSIS — J45909 Unspecified asthma, uncomplicated: Secondary | ICD-10-CM | POA: Diagnosis not present

## 2024-06-11 DIAGNOSIS — M069 Rheumatoid arthritis, unspecified: Secondary | ICD-10-CM | POA: Diagnosis not present

## 2024-06-11 DIAGNOSIS — K219 Gastro-esophageal reflux disease without esophagitis: Secondary | ICD-10-CM | POA: Diagnosis not present

## 2024-06-11 HISTORY — PX: LEFT HEART CATH AND CORONARY ANGIOGRAPHY: CATH118249

## 2024-06-11 LAB — BASIC METABOLIC PANEL WITH GFR
Anion gap: 9 (ref 5–15)
BUN: 14 mg/dL (ref 8–23)
CO2: 26 mmol/L (ref 22–32)
Calcium: 9.5 mg/dL (ref 8.9–10.3)
Chloride: 104 mmol/L (ref 98–111)
Creatinine, Ser: 0.85 mg/dL (ref 0.44–1.00)
GFR, Estimated: >60 mL/min (ref >60)
Glucose, Bld: 108 mg/dL — ABNORMAL HIGH (ref 70–99)
Potassium: 4.4 mmol/L (ref 3.5–5.1)
Sodium: 139 mmol/L (ref 135–145)

## 2024-06-11 LAB — CBC
HCT: 42.1 % (ref 36.0–46.0)
Hemoglobin: 13.8 g/dL (ref 12.0–15.0)
MCH: 32.6 pg (ref 26.0–34.0)
MCHC: 32.8 g/dL (ref 30.0–36.0)
MCV: 99.5 fL (ref 80.0–100.0)
Platelets: 155 K/uL (ref 150–400)
RBC: 4.23 MIL/uL (ref 3.87–5.11)
RDW: 14.6 % (ref 11.5–15.5)
WBC: 6.2 K/uL (ref 4.0–10.5)
nRBC: 0 % (ref 0.0–0.2)

## 2024-06-11 SURGERY — LEFT HEART CATH AND CORONARY ANGIOGRAPHY
Anesthesia: LOCAL

## 2024-06-11 MED ORDER — VERAPAMIL HCL 2.5 MG/ML IV SOLN
INTRAVENOUS | Status: AC
Start: 2024-06-11 — End: 2024-06-11
  Filled 2024-06-11: qty 2

## 2024-06-11 MED ORDER — ASPIRIN 81 MG PO CHEW: 81.0000 mg | CHEWABLE_TABLET | ORAL | Status: DC

## 2024-06-11 MED ORDER — LABETALOL HCL 5 MG/ML IV SOLN: 10.0000 mg | INTRAVENOUS | Status: DC | PRN

## 2024-06-11 MED ORDER — SODIUM CHLORIDE 0.9 % IV SOLN: 250.0000 mL | INTRAVENOUS | Status: DC | PRN

## 2024-06-11 MED ORDER — HEPARIN SODIUM (PORCINE) 1000 UNIT/ML IJ SOLN
INTRAMUSCULAR | Status: DC | PRN
  Administered 2024-06-11: 3000 [IU] via INTRAVENOUS

## 2024-06-11 MED ORDER — HEPARIN (PORCINE) IN NACL 1000-0.9 UT/500ML-% IV SOLN
INTRAVENOUS | Status: DC | PRN
  Administered 2024-06-11 (×2): 500 mL

## 2024-06-11 MED ORDER — IOHEXOL 350 MG/ML SOLN
INTRAVENOUS | Status: DC | PRN
Start: 2024-06-11 — End: 2024-06-11
  Administered 2024-06-11: 45 mL

## 2024-06-11 MED ORDER — LIDOCAINE HCL (PF) 1 % IJ SOLN
INTRAMUSCULAR | Status: DC | PRN
Start: 2024-06-11 — End: 2024-06-11
  Administered 2024-06-11: 2 mL via INTRADERMAL

## 2024-06-11 MED ORDER — FENTANYL CITRATE (PF) 100 MCG/2ML IJ SOLN
INTRAMUSCULAR | Status: DC | PRN
  Administered 2024-06-11 (×2): 25 ug via INTRAVENOUS

## 2024-06-11 MED ORDER — HEPARIN SODIUM (PORCINE) 1000 UNIT/ML IJ SOLN
INTRAMUSCULAR | Status: AC
  Filled 2024-06-11: qty 10

## 2024-06-11 MED ORDER — HYDRALAZINE HCL 20 MG/ML IJ SOLN
10.0000 mg | INTRAMUSCULAR | Status: DC | PRN
Start: 2024-06-11 — End: 2024-06-11

## 2024-06-11 MED ORDER — SODIUM CHLORIDE 0.9 % IV SOLN: INTRAVENOUS | Status: AC

## 2024-06-11 MED ORDER — ACETAMINOPHEN 325 MG PO TABS
650.0000 mg | ORAL_TABLET | ORAL | Status: DC | PRN
Start: 2024-06-11 — End: 2024-06-11

## 2024-06-11 MED ORDER — SODIUM CHLORIDE 0.9 % WEIGHT BASED INFUSION: 1.0000 mL/kg/h | INTRAVENOUS | Status: DC

## 2024-06-11 MED ORDER — SODIUM CHLORIDE 0.9% FLUSH: 3.0000 mL | Freq: Two times a day (BID) | INTRAVENOUS | Status: DC

## 2024-06-11 MED ORDER — ONDANSETRON HCL 4 MG/2ML IJ SOLN: 4.0000 mg | Freq: Four times a day (QID) | INTRAMUSCULAR | Status: DC | PRN

## 2024-06-11 MED ORDER — SODIUM CHLORIDE 0.9 % WEIGHT BASED INFUSION: 3.0000 mL/kg/h | INTRAVENOUS | Status: AC

## 2024-06-11 MED ORDER — SODIUM CHLORIDE 0.9% FLUSH: 3.0000 mL | INTRAVENOUS | Status: DC | PRN

## 2024-06-11 MED ORDER — MIDAZOLAM HCL 2 MG/2ML IJ SOLN
INTRAMUSCULAR | Status: DC | PRN
  Administered 2024-06-11: 1 mg via INTRAVENOUS

## 2024-06-11 MED ORDER — HEPARIN (PORCINE) IN NACL 2-0.9 UNITS/ML
INTRAMUSCULAR | Status: DC | PRN
  Administered 2024-06-11: 10 mL via INTRA_ARTERIAL

## 2024-06-11 MED ORDER — VERAPAMIL HCL 2.5 MG/ML IV SOLN
INTRAVENOUS | Status: DC | PRN
  Administered 2024-06-11: 10 mL via INTRA_ARTERIAL

## 2024-06-11 MED ORDER — FENTANYL CITRATE (PF) 100 MCG/2ML IJ SOLN
INTRAMUSCULAR | Status: AC
  Filled 2024-06-11: qty 2

## 2024-06-11 MED ORDER — MIDAZOLAM HCL 2 MG/2ML IJ SOLN
INTRAMUSCULAR | Status: AC
  Filled 2024-06-11: qty 2

## 2024-06-11 MED ORDER — LIDOCAINE HCL (PF) 1 % IJ SOLN
INTRAMUSCULAR | Status: AC
  Filled 2024-06-11: qty 30

## 2024-06-11 SURGICAL SUPPLY — 10 items
CATH INFINITI 5 FR JL3.5 (CATHETERS) IMPLANT
CATH INFINITI AMBI 5FR TG (CATHETERS) IMPLANT
CATH LAUNCHER 5F EBU3.0 (CATHETERS) IMPLANT
DEVICE RAD COMP TR BAND LRG (VASCULAR PRODUCTS) IMPLANT
GLIDESHEATH SLEND A-KIT 6F 22G (SHEATH) IMPLANT
GUIDEWIRE INQWIRE 1.5J.035X260 (WIRE) IMPLANT
KIT SYRINGE INJ CVI SPIKEX1 (MISCELLANEOUS) IMPLANT
PACK CARDIAC CATHETERIZATION (CUSTOM PROCEDURE TRAY) ×1 IMPLANT
SET ATX-X65L (MISCELLANEOUS) IMPLANT
SHEATH PROBE COVER 6X72 (BAG) IMPLANT

## 2024-06-11 NOTE — Interval H&P Note (Signed)
 History and Physical Interval Note:  06/11/2024 12:30 PM  Courtney Crane  has presented today for surgery, with the diagnosis of chest pain.  The various methods of treatment have been discussed with the patient and family. After consideration of risks, benefits and other options for treatment, the patient has consented to  Procedure(s): LEFT HEART CATH AND CORONARY ANGIOGRAPHY (N/A) as a surgical intervention.  The patient's history has been reviewed, patient examined, no change in status, stable for surgery.  I have reviewed the patient's chart and labs.  Questions were answered to the patient's satisfaction.     Orly Quimby J Kylar Speelman

## 2024-06-11 NOTE — Discharge Instructions (Signed)
 Radial Site Care  This sheet gives you information about how to care for yourself after your procedure. Your health care provider may also give you more specific instructions. If you have problems or questions, contact your health care provider. What can I expect after the procedure? After the procedure, it is common to have: Bruising and tenderness at the catheter insertion area. Follow these instructions at home: Medicines Take over-the-counter and prescription medicines only as told by your health care provider. Insertion site care Follow instructions from your health care provider about how to take care of your insertion site. Make sure you: Wash your hands with soap and water before you remove your bandage (dressing). If soap and water are not available, use hand sanitizer. May remove dressing in 24 hours. Check your insertion site every day for signs of infection. Check for: Redness, swelling, or pain. Fluid or blood. Pus or a bad smell. Warmth. Do no take baths, swim, or use a hot tub for 5 days. You may shower 24-48 hours after the procedure. Remove the dressing and gently wash the site with plain soap and water. Pat the area dry with a clean towel. Do not rub the site. That could cause bleeding. Do not apply powder or lotion to the site. Activity  For 24 hours after the procedure, or as directed by your health care provider: Do not flex or bend the affected arm. Do not push or pull heavy objects with the affected arm. Do not drive yourself home from the hospital or clinic. You may drive 24 hours after the procedure. Do not operate machinery or power tools. KEEP ARM ELEVATED THE REMAINDER OF THE DAY. Do not push, pull or lift anything that is heavier than 10 lb for 5 days. Ask your health care provider when it is okay to: Return to work or school. Resume usual physical activities or sports. Resume sexual activity. General instructions If the catheter site starts to  bleed, raise your arm and put firm pressure on the site. If the bleeding does not stop, get help right away. This is a medical emergency. DRINK PLENTY OF FLUIDS FOR THE NEXT 2-3 DAYS. No alcohol consumption for 24 hours after receiving sedation. If you went home on the same day as your procedure, a responsible adult should be with you for the first 24 hours after you arrive home. Keep all follow-up visits as told by your health care provider. This is important. Contact a health care provider if: You have a fever. You have redness, swelling, or yellow drainage around your insertion site. Get help right away if: You have unusual pain at the radial site. The catheter insertion area swells very fast. The insertion area is bleeding, and the bleeding does not stop when you hold steady pressure on the area. Your arm or hand becomes pale, cool, tingly, or numb. These symptoms may represent a serious problem that is an emergency. Do not wait to see if the symptoms will go away. Get medical help right away. Call your local emergency services (911 in the [place] Do not drive yourself to the hospital. Summary After the procedure, it is common to have bruising and tenderness at the site. Follow instructions from your health care provider about how to take care of your radial site wound. Check the wound every day for signs of infection.  This information is not intended to replace advice given to you by your health care provider. Make sure you discuss any questions you have with  your health care provider. Document Revised: 12/11/2017 Document Reviewed: 12/11/2017 Elsevier Patient Education  2020 [employer].

## 2024-06-11 NOTE — H&P (Signed)
 OV 06/09/2024 copied for documentation   Cardiology Consultation:    Date:  06/11/2024   ID:  Courtney Crane, DOB [birth year], MRN [id number]  PCP:  [name], MD  Cardiologist:  Newman JINNY Lawrence, MD   Referring MD: No ref. provider found   No chief complaint on file.    ASSESSMENT AND PLAN:   Courtney Crane 73 year old woman  history of cardiac cath normal coronaries 15 years ago in [place]  [done in the setting of ongoing cardiac chest symptoms and palpitations], asthma, hypertension, hyperlipidemia, rheumatoid arthritis, GERD, irritable bowel syndrome, iron deficiency, diverticulitis, Lexiscan  stress test with nuclear imaging June 2020 showed no ischemia, last echocardiogram 10/10/2021 reported normal LV function with EF 60 to 65%, normal diastolic function without significant valvular abnormalities.   Problem List Items Addressed This Visit       Other   Chest pain on exertion   Relevant Medications   aspirin  chewable tablet 81 mg   0.9% sodium chloride  infusion   Return to clinic in 4 weeks tentatively.   History of Present Illness:    Courtney Crane is a 73 y.o. female who is being seen today for establishing care here with cardiology. Previously followed up with cardiologist at [facility] with Dr. Ladona and seen [name] [facility] on 01-17-2022. PCP is No ref. provider found.  Lives near [place].  At home with her husband.  Has history of cardiac cath normal coronaries 15 years ago in [place]  [done in the setting of ongoing cardiac chest symptoms and palpitations], asthma, hypertension, hyperlipidemia, rheumatoid arthritis, GERD, irritable bowel syndrome, iron deficiency, diverticulitis, Lexiscan  stress test with nuclear imaging June 2020 showed no ischemia, last echocardiogram 10/10/2021 reported normal LV function with EF 60 to 65%, normal diastolic function without significant valvular abnormalities.  Mentions over the past month  and a half she has been dealing with symptoms of chest pressure heaviness and shortness of breath with minimal day-to-day exertion.  This has been significant even when she started the cruise last month.  Has significantly limited her activities at home. No orthopnea.  No pedal edema, no palpitations. No syncopal or near syncopal episodes.  Mentions blood pressures at home typically tend to be elevated with occasional readings of systolic blood pressure up to 814 over diastolic 90 mmHg using her upper arm cuff. Blood pressure readings here in the office today reasonably controlled.  Does not smoke. Drinks 1 glass of wine at night. No other substance use. Good compliance with medications  EKG in the clinic today shows sinus rhythm heart rate 74/min, PR interval 152 ms, QRS duration 68 ms, QTc 432 ms.  No ischemic changes.   Past Medical History:  Diagnosis Date   AKI (acute kidney injury) (HCC) 11/29/2017   Allergy    Anemia    Anxiety    Anxiety disorder 12/06/2014   Arthritis    Asthma    Bloating 09/19/2023   Bursitis of left hip 09/17/2019   Carpal tunnel syndrome of left wrist 04/24/2017   Cataract    Cellulitis and abscess of buttock 11/28/2017   Cholelithiasis with chronic cholecystitis 01/30/2017   Chronic diarrhea 09/19/2023   Claudication (HCC) 04/09/2019   Colon polyps    Complication of anesthesia    sometimes I dont like to wake up after  ie difficult to awaken    Disorder of bone 09/23/2019   Diverticulitis    Diverticulosis    DOE (dyspnea on exertion) 04/09/2019   Dysphagia 01/19/2020   Encounter for  general adult medical examination without abnormal findings 01/30/2018   Essential hypertension    Uncontrolled, Norvasc  10 MG daily     Exertional chest pain 04/09/2019   Family history of ischemic heart disease (IHD) 09/14/2015   Fatty liver    Flatulence 09/19/2023   Genital herpes simplex 06/06/2016   GERD (gastroesophageal reflux disease)     Hardening of the aorta (main artery of the heart) (HCC) 06/08/2024   History of right shoulder replacement 12/06/2022   History of shingles 06/06/2016   HTN (hypertension)    Hyperlipidemia 12/06/2014   IBS (irritable bowel syndrome)    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   Insomnia, unspecified    On Melatonin, controlled     Iron deficiency anemia 12/06/2014   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   Leukocytosis 12/04/2017   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   MRSA infection    Muscle weakness 05/11/2020   Myopathy 03/31/2019   Osteoarthritis    Osteopenia    Overactive bladder 06/08/2024   Pain 12/05/2019   Pain involving joint of finger of left hand 04/26/2017   Pain of left lower extremity 08/22/2020   PONV (postoperative nausea and vomiting)    Poorly controlled persistent asthma 05/17/2022   Prediabetes    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   Rheumatoid arthritis (HCC)    Right shoulder pain 12/06/2022   S/P lumbar fusion 01/23/2021   Seasonal allergies    Seborrheic dermatitis    Shingles    Spinal stenosis of lumbar region 06/08/2024   Status post dilation of esophageal narrowing    Strain of muscle, fascia and tendon of the posterior muscle group at thigh level, right thigh, subsequent encounter 08/15/2020   Thyroid  nodule 06/08/2024   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   Type 2 diabetes mellitus with other specified complication (HCC) 06/08/2024   Uncomplicated asthma 12/06/2014   Vitamin D  deficiency     Past Surgical History:  Procedure Laterality Date   ADENOIDECTOMY     APPENDECTOMY  1975   bladder tack  1990   CATARACT EXTRACTION, BILATERAL Bilateral    CHOLECYSTECTOMY N/A 01/31/2017   Procedure: LAPAROSCOPIC CHOLECYSTECTOMY WITH INTRAOPERATIVE CHOLANGIOGRAM;  Surgeon: Krystal Spinner, MD;  Location: [facility];  Service: General;  Laterality: N/A;   EYE SURGERY  September 2020   INCISION AND DRAINAGE PERIRECTAL  ABSCESS N/A 12/02/2017   Procedure: IRRIGATION AND DEBRIDEMENT PERINEAL ABSCESS;  Surgeon: Tanda Locus, MD;  Location: [facility] OR;  Service: General;  Laterality: N/A;   JOINT REPLACEMENT  2011, 11-2022   LUMBAR FUSION     MENISCUS REPAIR Right 2013   REVERSE SHOULDER ARTHROPLASTY Right 12/06/2022   Procedure: RIGHT REVERSE SHOULDER ARTHROPLASTY;  Surgeon: Dozier Soulier, MD;  Location: [facility];  Service: Orthopedics;  Laterality: Right;   ROTATOR CUFF REPAIR Right 2005   SPINE SURGERY  March 2022   TONSILLECTOMY     as a child   TOTAL ABDOMINAL HYSTERECTOMY  1988   TOTAL KNEE ARTHROPLASTY Left    WRIST RECONSTRUCTION Right 2008    Current Medications: Current Meds  Medication Sig   acetaminophen  (TYLENOL ) 500 MG tablet Take 1,000 mg by mouth every 6 (six) hours as needed for mild pain (pain score 1-3) or moderate pain (pain score 4-6).   albuterol  (2.5 MG/3ML) 0.083% NEBU 3 mL, albuterol  (5 MG/ML) 0.5% NEBU 0.5 mL Inhale 5 mg into the lungs every 6 (six) hours as needed (asthma).   Albuterol -Budesonide (AIRSUPRA ) 90-80 MCG/ACT AERO  Inhale 2 puffs into the lungs every 4 (four) hours as needed.   amLODipine  (NORVASC ) 10 MG tablet Take 10 mg by mouth in the morning.   Ascorbic Acid  (VITAMIN C) 1000 MG tablet Take 1,000 mg by mouth daily.   bismuth subsalicylate (PEPTO BISMOL) 262 MG chewable tablet Chew 262-524 mg by mouth 3 (three) times daily as needed for diarrhea or loose stools or indigestion.   budeson-glycopyrrolate-formoterol (BREZTRI  AEROSPHERE) 160-9-4.8 MCG/ACT AERO inhaler Inhale 2 puffs into the lungs in the morning and at bedtime.   Calcium  Carb-Cholecalciferol  (CALCIUM  600+D3 PO) Take 1 tablet by mouth in the morning.   calcium  elemental as carbonate (TUMS ULTRA 1000) 400 MG chewable tablet Chew 1,000 mg by mouth 3 (three) times daily as needed for heartburn.   CANNABIDIOL PO Take 1 capsule by mouth at bedtime. CBD   cetirizine  (ZYRTEC ) 10 MG tablet Take 10 mg by mouth daily as  needed for allergies.   Cholecalciferol  (VITAMIN D -3) 125 MCG (5000 UT) TABS Take 5,000 Units by mouth in the morning.   famotidine  (PEPCID ) 40 MG tablet TAKE 1 TABLET BY MOUTH EVERYDAY AT BEDTIME   Ferrous Sulfate ([employer] SLOW RELEASE IRON PO) Take 45 mg by mouth every other day.   fexofenadine  (ALLEGRA ) 180 MG tablet Take 0.5 tablets (90 mg total) by mouth in the morning.   Fluticasone Propionate  (XHANCE ) 93 MCG/ACT EXHU Place 2 sprays into the nose 2 (two) times daily as needed. (Patient taking differently: Place 2 sprays into the nose in the morning and at bedtime.)   hydrochlorothiazide  (HYDRODIURIL ) 12.5 MG tablet Take 12.5 mg by mouth every morning.   inFLIXimab  (REMICADE ) 100 MG injection Inject into the vein every 5 (five) weeks.   ipratropium (ATROVENT ) 0.06 % nasal spray Place 1 spray into both nostrils daily.   ipratropium-albuterol  (DUONEB) 0.5-2.5 (3) MG/3ML SOLN Take 3 mLs by nebulization every 4 (four) hours as needed.   leflunomide (ARAVA) 20 MG tablet Take 20 mg by mouth every evening.   losartan  (COZAAR ) 100 MG tablet Take 100 mg by mouth daily.   magnesium gluconate (MAGONATE) 500 (27 Mg) MG TABS tablet Take 500 mg by mouth daily.   MELATONIN PO Take 1.5 mg by mouth at bedtime as needed (sleep).   mirabegron  ER (MYRBETRIQ ) 25 MG TB24 tablet Take 1 tablet (25 mg total) by mouth daily.   Multiple Vitamins-Minerals (IMMUNE SUPPORT VITAMIN C PO) Take 1 tablet by mouth in the morning. With Vitamin D  3/Vitamin A/Magnesium   nitroGLYCERIN  (NITROSTAT ) 0.4 MG SL tablet Place 1 tablet (0.4 mg total) under the tongue every 5 (five) minutes as needed.   Omega-3 Fatty Acids (FISH OIL) 1200 MG CAPS Take 1,200 mg by mouth 2 (two) times daily.   omeprazole  (PRILOSEC) 40 MG capsule TAKE 1 CAPSULE (40 MG TOTAL) BY MOUTH 2 (TWO) TIMES DAILY BEFORE A MEAL.   polyethylene glycol (MIRALAX  / GLYCOLAX ) 17 g packet Take 17 g by mouth daily as needed for moderate constipation. In the morning   Psyllium  (METAMUCIL 4 IN 1 FIBER PO) Take 2 capsules by mouth in the morning. Metamucil 3-in-1 Psyllium Fiber Supplement Capsules   rosuvastatin  (CRESTOR ) 10 MG tablet Take 10 mg by mouth every evening.   simethicone (MYLICON) 125 MG chewable tablet Chew 125 mg by mouth every 6 (six) hours as needed for flatulence.   sodium chloride  (OCEAN) 0.65 % SOLN nasal spray Place 1 spray into both nostrils in the morning and at bedtime.   temazepam (RESTORIL) 15  MG capsule Take 15 mg by mouth at bedtime.   tretinoin (RETIN-A) 0.025 % cream Apply 1 Application topically at bedtime.     Allergies:   Meperidine, Methotrexate, Nickel, and Tape   Social History   Socioeconomic History   Marital status: Married    Spouse name: Not on file   Number of children: 2   Years of education: Not on file   Highest education level: Not on file  Occupational History   Occupation: retired  Tobacco Use   Smoking status: Never    Passive exposure: Never   Smokeless tobacco: Never  Vaping Use   Vaping status: Never Used  Substance and Sexual Activity   Alcohol  use: Yes    Alcohol /week: 7.0 standard drinks of alcohol     Types: 7 Standard drinks or equivalent per week    Comment: wine daily   Drug use: No    Comment: takes CBD oil   Sexual activity: Not Currently    Partners: Male    Birth control/protection: Post-menopausal    Comment: Hx HSV, High RISK Medicare  Other Topics Concern   Not on file  Social History Narrative   Retired from [employer]   Lives at home with husband ; one level home   Right handed   Highest level of education:  Some college   Social Drivers of Corporate investment banker Strain: Not on file  Food Insecurity: Not on file  Transportation Needs: Not on file  Physical Activity: Not on file  Stress: Not on file  Social Connections: Not on file     Family History: The patient's family history includes Arthritis in her mother; CAD in her brother; Cancer in her mother; Colon polyps in  her mother and son; Dementia in her father; Diabetes in her mother; Esophageal cancer in her brother; Hearing loss in her mother; Heart disease in her mother; Hyperlipidemia in her mother; Hypertension in her mother; Kidney cancer in her mother; Kidney disease in her mother. There is no history of Colon cancer, Stomach cancer, Rectal cancer, Prostate cancer, or Pancreatic cancer. ROS:   Please see the history of present illness.    All 14 point review of systems negative except as described per history of present illness.  EKGs/Labs/Other Studies Reviewed:    The following studies were reviewed today:   EKG:       Recent Labs: 10/12/2023: ALT 63; TSH 2.922 06/11/2024: BUN 14; Creatinine, Ser 0.85; Hemoglobin 13.8; Platelets 155; Potassium 4.4; Sodium 139  Recent Lipid Panel No results found for: CHOL, TRIG, HDL, CHOLHDL, VLDL, LDLCALC, LDLDIRECT  Physical Exam:    VS:  BP (!) 155/64   Pulse 72   Temp 98.4 F (36.9 C)   Resp 16   Ht 5' 2 (1.575 m)   Wt 59 kg   SpO2 98%   BMI 23.78 kg/m     Wt Readings from Last 3 Encounters:  06/11/24 59 kg  06/09/24 60.1 kg  05/14/24 60.1 kg     GENERAL:  Well nourished, well developed in no acute distress NECK: No JVD; No carotid bruits CARDIAC: RRR, S1 and S2 present, no murmurs, no rubs, no gallops CHEST:  Clear to auscultation without rales, wheezing or rhonchi  Extremities: No pitting pedal edema. Pulses bilaterally symmetric with radial 2+ and dorsalis pedis 2+ NEUROLOGIC:  Alert and oriented x 3  Medication Adjustments/Labs and Tests Ordered: Current medicines are reviewed at length with the patient today.  Concerns regarding medicines are  outlined above.  Orders Placed This Encounter  Procedures   Basic metabolic panel   CBC   Informed Consent Details: Physician/Practitioner Attestation; Transcribe to consent form and obtain patient signature   Apply Cardiac or Vascular Catheterization and/or Intervention Care  Plan   Confirm CBC and BMP (or CMP) results within 7 days for inpatient and 30 days for outpatient: Outpatients with severe anemia (hgb<10, CKD, severe thrombocytopenia plts<100) labs should be within 10 days. Only draw PT/INR on patients that are on Coumadin, Hgb<10, have liver disease (cirrhosis, liver CA, hepatitis, etc). Urine pregnancy test within hospital admission for inpatients of child bearing age, for outpatients day of procedure.   Confirm EKG performed within 30 days for cardiac procedures and 12 months for peripheral vascular procedures.  Place order for EKG if missing or not within timeframe.   Verify aspirin  and / or anti-platelet medication (Plavix, Effient, Brilinta) dose available for cardiac / peripheral vascular procedure day. IF ordered daily / once, adjust schedule to administer before procedure.   Weigh patient   Initiate Cath/PCI clinical path; encourage patient to watch CCTV video   Insert peripheral IV   Insert 2nd peripheral IV site-Saline lock IV   Meds ordered this encounter  Medications   aspirin  chewable tablet 81 mg   FOLLOWED BY Linked Order Group    0.9% sodium chloride  infusion    0.9% sodium chloride  infusion    Signed, Alean reddy Madireddy, MD, MPH, [facility]. 06/11/2024 12:30 PM    [facility]

## 2024-06-12 ENCOUNTER — Encounter (HOSPITAL_COMMUNITY): Payer: Self-pay | Admitting: Cardiology

## 2024-06-15 ENCOUNTER — Ambulatory Visit (HOSPITAL_COMMUNITY)
Admission: RE | Admit: 2024-06-15 | Discharge: 2024-06-15 | Disposition: A | Discharge status: Home / Self Care | Source: Ambulatory Visit | Attending: Cardiology | Admitting: Cardiology

## 2024-06-15 DIAGNOSIS — R079 Chest pain, unspecified: Secondary | ICD-10-CM | POA: Diagnosis not present

## 2024-06-15 LAB — ECHOCARDIOGRAM COMPLETE
Area-P 1/2: 2.5 cm2
S' Lateral: 2.26 cm

## 2024-06-16 ENCOUNTER — Ambulatory Visit: Payer: Self-pay

## 2024-06-16 DIAGNOSIS — E041 Nontoxic single thyroid nodule: Secondary | ICD-10-CM | POA: Diagnosis not present

## 2024-06-16 DIAGNOSIS — M069 Rheumatoid arthritis, unspecified: Secondary | ICD-10-CM | POA: Diagnosis not present

## 2024-06-16 DIAGNOSIS — K580 Irritable bowel syndrome with diarrhea: Secondary | ICD-10-CM | POA: Diagnosis not present

## 2024-06-16 DIAGNOSIS — Z0000 Encounter for general adult medical examination without abnormal findings: Secondary | ICD-10-CM | POA: Diagnosis not present

## 2024-06-16 DIAGNOSIS — Z1339 Encounter for screening examination for other mental health and behavioral disorders: Secondary | ICD-10-CM | POA: Diagnosis not present

## 2024-06-16 DIAGNOSIS — J45909 Unspecified asthma, uncomplicated: Secondary | ICD-10-CM | POA: Diagnosis not present

## 2024-06-16 DIAGNOSIS — I10 Essential (primary) hypertension: Secondary | ICD-10-CM | POA: Diagnosis not present

## 2024-06-16 DIAGNOSIS — Z1331 Encounter for screening for depression: Secondary | ICD-10-CM | POA: Diagnosis not present

## 2024-06-16 DIAGNOSIS — D509 Iron deficiency anemia, unspecified: Secondary | ICD-10-CM | POA: Diagnosis not present

## 2024-06-16 DIAGNOSIS — F330 Major depressive disorder, recurrent, mild: Secondary | ICD-10-CM | POA: Diagnosis not present

## 2024-06-16 DIAGNOSIS — D84821 Immunodeficiency due to drugs: Secondary | ICD-10-CM | POA: Diagnosis not present

## 2024-06-16 DIAGNOSIS — E785 Hyperlipidemia, unspecified: Secondary | ICD-10-CM | POA: Diagnosis not present

## 2024-06-16 DIAGNOSIS — E1169 Type 2 diabetes mellitus with other specified complication: Secondary | ICD-10-CM | POA: Diagnosis not present

## 2024-06-16 MED ORDER — FUROSEMIDE 20 MG PO TABS: ORAL_TABLET | ORAL | 3 refills | Status: AC

## 2024-06-17 ENCOUNTER — Encounter: Payer: PPO | Admitting: Radiology

## 2024-06-19 DIAGNOSIS — J4550 Severe persistent asthma, uncomplicated: Secondary | ICD-10-CM | POA: Diagnosis not present

## 2024-06-23 ENCOUNTER — Encounter: Payer: Self-pay | Admitting: Allergy

## 2024-06-23 MED ORDER — AIRSUPRA 90-80 MCG/ACT IN AERO: 2.0000 | INHALATION_SPRAY | RESPIRATORY_TRACT | 2 refills | Status: DC | PRN

## 2024-06-30 ENCOUNTER — Encounter (INDEPENDENT_AMBULATORY_CARE_PROVIDER_SITE_OTHER): Payer: Self-pay

## 2024-06-30 ENCOUNTER — Other Ambulatory Visit: Payer: Self-pay

## 2024-06-30 ENCOUNTER — Other Ambulatory Visit (HOSPITAL_COMMUNITY): Payer: Self-pay

## 2024-06-30 NOTE — Progress Notes (Signed)
 Specialty Pharmacy Refill Coordination Note  MyChart Questionnaire Submission  Courtney Crane is a 73 y.o. female contacted today regarding refills of specialty medication(s) Dupixent .  Doses on hand: (Patient-Rptd) 1 (for 07/01/24)  Injection date: 07/15/24  Patient requested: (Patient-Rptd) Delivery   Delivery date: 07/02/24  Verified address: [address] [place] [address]  Medication will be filled on 07/01/24.

## 2024-07-01 ENCOUNTER — Other Ambulatory Visit: Payer: Self-pay

## 2024-07-02 ENCOUNTER — Ambulatory Visit: Admitting: Internal Medicine

## 2024-07-02 ENCOUNTER — Encounter: Payer: Self-pay | Admitting: Internal Medicine

## 2024-07-02 VITALS — BP 90/60 | HR 107 | Temp 98.5°F | Ht 62.0 in | Wt 133.0 lb

## 2024-07-02 DIAGNOSIS — J4520 Mild intermittent asthma, uncomplicated: Secondary | ICD-10-CM | POA: Diagnosis not present

## 2024-07-02 NOTE — Patient Instructions (Signed)
 Continue Breztri  as prescribed Rinse mouth after use Avoid Allergens and Irritants Avoid secondhand smoke Avoid SICK contacts Recommend  Masking  when appropriate Recommend Keep up-to-date with vaccinations  Follow-up with cardiology as scheduled Patient lives in [place] follow-up with Dr. Meade

## 2024-07-02 NOTE — Progress Notes (Signed)
 SYNOPSIS Courtney Crane is a 73 y.o. female with rheumatoid arthritis ( on nfliximab infusion), normal PFTs  with baseline shortness of breath related to persistent asthma symptoms. She is now on Dupixent .   Maintenance Inhaler>> Breztri  Maintenance Biologic>> Dupixent  Rescue>> Albuterol  as needed  Test Results: CT Chest October 2022: No PE, there is some subtle parenchymal changes, possibly related to atelectasis but there is an obvious difference there on her CT imaging.   HRCT chest 01/26/2022: No evidence of interstitial lung disease, lung nodules stable, thyroid  nodule.    Pulmonary Functions Testing Results:     Latest Ref Rng & Units 01/24/2022    3:54 PM  PFT Results  FVC-Pre L 2.64   FVC-Predicted Pre % 91   FVC-Post L 2.74   FVC-Predicted Post % 95   Pre FEV1/FVC % % 72   Post FEV1/FCV % % 77   FEV1-Pre L 1.90   FEV1-Predicted Pre % 87   FEV1-Post L 2.10   DLCO uncorrected ml/min/mmHg 18.05   DLCO UNC% % 95   DLCO corrected ml/min/mmHg 18.34   DLCO COR %Predicted % 97   DLVA Predicted % 99   TLC L 4.75   TLC % Predicted % 96   RV % Predicted % 104     Allergy immunology pulmonary function testing May 2025 Pulmonary function testing do not no significant changes from previous PFTs   CC Follow up assessment of ASTHMA  HPI Asthma seems well-controlled at this time No exacerbation at this time No evidence of heart failure at this time No evidence or signs of infection at this time No respiratory distress No fevers, chills, nausea, vomiting, diarrhea No evidence of lower extremity edema No evidence hemoptysis  Patient underwent significant cardiac evaluation Follow-up cardiology pending Patient currently on diuretics   Her symptoms have significantly improved     Latest Ref Rng & Units 06/11/2024   10:20 AM 01/30/2024   12:55 PM 10/12/2023   11:05 AM  CBC  WBC 4.0 - 10.5 K/uL 6.2  6.7  7.9   Hemoglobin 12.0 - 15.0 g/dL 86.1  87.1  87.8    Hematocrit 36.0 - 46.0 % 42.1  38.7  37.2   Platelets 150 - 400 K/uL 155  179  150        Latest Ref Rng & Units 06/11/2024   10:20 AM 10/12/2023   11:05 AM 12/04/2022    2:22 PM  BMP  Glucose 70 - 99 mg/dL 891  889  885   BUN 8 - 23 mg/dL 14  19  16    Creatinine 0.44 - 1.00 mg/dL 9.14  9.11  9.07   Sodium 135 - 145 mmol/L 139  137  136   Potassium 3.5 - 5.1 mmol/L 4.4  3.7  3.6   Chloride 98 - 111 mmol/L 104  102  101   CO2 22 - 32 mmol/L 26  27  24    Calcium  8.9 - 10.3 mg/dL 9.5  9.7  9.3     BNP    Component Value Date/Time   BNP 19.7 08/31/2021 1525   Past medical hx Past Medical History:  Diagnosis Date   AKI (acute kidney injury) (HCC) 11/29/2017   Allergy    Anemia    Anxiety    Anxiety disorder 12/06/2014   Arthritis    Asthma    Bloating 09/19/2023   Bursitis of left hip 09/17/2019   Carpal tunnel syndrome of left wrist 04/24/2017   Cataract  Cellulitis and abscess of buttock 11/28/2017   Cholelithiasis with chronic cholecystitis 01/30/2017   Chronic diarrhea 09/19/2023   Claudication (HCC) 04/09/2019   Colon polyps    Complication of anesthesia    sometimes I dont like to wake up after  ie difficult to awaken    Disorder of bone 09/23/2019   Diverticulitis    Diverticulosis    DOE (dyspnea on exertion) 04/09/2019   Dysphagia 01/19/2020   Encounter for general adult medical examination without abnormal findings 01/30/2018   Essential hypertension    Uncontrolled, Norvasc  10 MG daily     Exertional chest pain 04/09/2019   Family history of ischemic heart disease (IHD) 09/14/2015   Fatty liver    Flatulence 09/19/2023   Genital herpes simplex 06/06/2016   GERD (gastroesophageal reflux disease)    Hardening of the aorta (main artery of the heart) (HCC) 06/08/2024   History of right shoulder replacement 12/06/2022   History of shingles 06/06/2016   HTN (hypertension)    Hyperlipidemia 12/06/2014   IBS (irritable bowel syndrome)     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   Insomnia, unspecified    On Melatonin, controlled     Iron deficiency anemia 12/06/2014   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   Leukocytosis 12/04/2017   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   MRSA infection    Muscle weakness 05/11/2020   Myopathy 03/31/2019   Osteoarthritis    Osteopenia    Overactive bladder 06/08/2024   Pain 12/05/2019   Pain involving joint of finger of left hand 04/26/2017   Pain of left lower extremity 08/22/2020   PONV (postoperative nausea and vomiting)    Poorly controlled persistent asthma 05/17/2022   Prediabetes    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   Rheumatoid arthritis (HCC)    Right shoulder pain 12/06/2022   S/P lumbar fusion 01/23/2021   Seasonal allergies    Seborrheic dermatitis    Shingles    Spinal stenosis of lumbar region 06/08/2024   Status post dilation of esophageal narrowing    Strain of muscle, fascia and tendon of the posterior muscle group at thigh level, right thigh, subsequent encounter 08/15/2020   Thyroid  nodule 06/08/2024   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 She has never been exposed to tobacco smoke. She has never used smokeless tobacco. She reports current alcohol  use of about 7.0 standard drinks of alcohol  per week. She reports that she does not use drugs.  Tobacco Cessation: Never smoker     Past surgical hx, Family hx, Social hx all reviewed.  Current Outpatient Medications on File Prior to Visit  Medication Sig   acetaminophen  (TYLENOL ) 500 MG tablet Take 1,000 mg by mouth every 6 (six) hours as needed for mild pain (pain score 1-3) or moderate pain (pain score 4-6).  albuterol  (2.5 MG/3ML) 0.083% NEBU 3 mL, albuterol  (5 MG/ML) 0.5% NEBU 0.5 mL Inhale 5 mg into the lungs every 6 (six) hours as needed (asthma).   Albuterol -Budesonide (AIRSUPRA ) 90-80 MCG/ACT AERO Inhale 2 puffs into the lungs every 4 (four) hours as needed.   amLODipine  (NORVASC ) 10 MG tablet Take 10 mg by mouth in the morning.   Ascorbic Acid  (VITAMIN C) 1000 MG tablet Take 1,000 mg by mouth daily.   ASPIRIN  81 PO    Biotin 10 MG CHEW    bismuth subsalicylate (PEPTO BISMOL) 262 MG chewable tablet Chew 262-524 mg by mouth 3 (three) times daily as needed for diarrhea or loose stools or indigestion.   budeson-glycopyrrolate-formoterol (BREZTRI  AEROSPHERE) 160-9-4.8 MCG/ACT AERO inhaler Inhale 2 puffs into the lungs in the morning and at bedtime.   Calcium  Carb-Cholecalciferol  (CALCIUM  600+D3 PO) Take 1 tablet by mouth in the morning.   calcium  elemental as carbonate (TUMS ULTRA 1000) 400 MG chewable tablet Chew 1,000 mg by mouth 3 (three) times daily as needed for heartburn.   CANNABIDIOL PO Take 1 capsule by mouth at bedtime. CBD   cetirizine  (ZYRTEC ) 10 MG tablet Take 10 mg by mouth daily as needed for allergies.   Cholecalciferol  (VITAMIN D -3) 125 MCG (5000 UT) TABS Take 5,000 Units by mouth in the morning.   Dupilumab  (DUPIXENT ) 300 MG/2ML SOAJ Inject 300 mg into the skin every 14 (fourteen) days.   famotidine  (PEPCID ) 40 MG tablet TAKE 1 TABLET BY MOUTH EVERYDAY AT BEDTIME   Ferrous Sulfate ([employer] SLOW RELEASE IRON PO) Take 45 mg by mouth every other day.   fexofenadine  (ALLEGRA ) 180 MG tablet Take 0.5 tablets (90 mg total) by mouth in the morning.   Fluticasone Propionate  (XHANCE ) 93 MCG/ACT EXHU Place 2  sprays into the nose 2 (two) times daily as needed. (Patient taking differently: Place 2 sprays into the nose in the morning and at bedtime.)   furosemide  (LASIX ) 20 MG tablet Take 20 mg (1 tablet) daily for 3 days and then decrease to 20 mg (1 tablet) every other day.   hydrochlorothiazide  (HYDRODIURIL ) 12.5 MG tablet Take 12.5 mg by mouth every morning.   inFLIXimab  (REMICADE ) 100 MG injection Inject into the vein every 5 (five) weeks.   ipratropium (ATROVENT ) 0.06 % nasal spray Place 1 spray into both nostrils daily.   ipratropium-albuterol  (DUONEB) 0.5-2.5 (3) MG/3ML SOLN Take 3 mLs by nebulization every 4 (four) hours as needed.   leflunomide (ARAVA) 20 MG tablet Take 20 mg by mouth every evening.   losartan  (COZAAR ) 100 MG tablet Take 100 mg by mouth daily.   magnesium gluconate (MAGONATE) 500 (27 Mg) MG TABS tablet Take 500 mg by mouth daily.   MELATONIN PO Take 1.5 mg by mouth at bedtime as needed (sleep).   mirabegron  ER (MYRBETRIQ ) 25 MG TB24 tablet Take 1 tablet (25 mg total) by mouth daily.   Multiple Vitamins-Minerals (IMMUNE SUPPORT VITAMIN C PO) Take 1 tablet by mouth in the morning. With Vitamin D  3/Vitamin A/Magnesium   nitroGLYCERIN  (NITROSTAT ) 0.4 MG SL tablet Place 1 tablet (0.4 mg total) under the tongue every 5 (five) minutes as needed.   Omega-3 Fatty Acids (FISH OIL) 1200 MG CAPS Take 1,200 mg by mouth 2 (two) times daily.   omeprazole  (PRILOSEC) 40 MG capsule TAKE 1 CAPSULE (40 MG TOTAL) BY MOUTH 2 (TWO) TIMES DAILY BEFORE A MEAL.   polyethylene glycol (MIRALAX  / GLYCOLAX ) 17 g packet Take 17 g by mouth daily as needed for  moderate constipation. In the morning   Psyllium (METAMUCIL 4 IN 1 FIBER PO) Take 2 capsules by mouth in the morning. Metamucil 3-in-1 Psyllium Fiber Supplement Capsules   rosuvastatin  (CRESTOR ) 10 MG tablet Take 10 mg by mouth every evening.   simethicone (MYLICON) 125 MG chewable tablet Chew 125 mg by mouth every 6 (six) hours as needed for flatulence.    sodium chloride  (OCEAN) 0.65 % SOLN nasal spray Place 1 spray into both nostrils in the morning and at bedtime.   temazepam (RESTORIL) 15 MG capsule Take 15 mg by mouth at bedtime.   tretinoin (RETIN-A) 0.025 % cream Apply 1 Application topically at bedtime.   valACYclovir  (VALTREX ) 500 MG tablet Take 500 mg by mouth daily as needed (outbreaks).   No current facility-administered medications on file prior to visit.     Allergies  Allergen Reactions   Meperidine Nausea Only   Methotrexate Other (See Comments)    Unknown reaction   Nickel Rash   Tape Rash and Other (See Comments)   BP 90/60 (BP Location: Right Arm, Patient Position: Sitting, Cuff Size: Normal)   Pulse (!) 107   Temp 98.5 F (36.9 C) (Oral)   Ht 5' 2 (1.575 m)   Wt 133 lb (60.3 kg)   SpO2 97%   BMI 24.33 kg/m    Review of Systems: Gen:  Denies  fever, sweats, chills weight loss  HEENT: Denies blurred vision, double vision, ear pain, eye pain, hearing loss, nose bleeds, sore throat Cardiac:  No dizziness, chest pain or heaviness, chest tightness,edema, No JVD Resp:   No cough, -sputum production, -shortness of breath,-wheezing, -hemoptysis,  Other:  All other systems negative   Physical Examination:   General Appearance: No distress  EYES PERRLA, EOM intact.   NECK Supple, No JVD Pulmonary: normal breath sounds, No wheezing.  CardiovascularNormal S1,S2.  No m/r/g.   Abdomen: Benign, Soft, non-tender. Neurology UE/LE 5/5 strength, no focal deficits Ext pulses intact, cap refill intact ALL OTHER ROS ARE NEGATIVE   Assessment/Plan 73 year old pleasant white female seen today for moderate to severe persistent asthma symptoms with possible underlying angina with a feeling of elephant on her chest associated with nausea anytime she exerts herself  Assessment of asthma continue with Breztri  inhaler as well as Dupixent  injections Continue Airsupra  as needed Avoid Allergens and Irritants Avoid secondhand  smoke Avoid SICK contacts Recommend  Masking  when appropriate Recommend Keep up-to-date with vaccinations   Angina elephant on her chest Follow-up cardiology  Continue diuretics    MEDICATION ADJUSTMENTS/LABS AND TESTS ORDERED: Continue medications for asthma Avoid Allergens and Irritants Avoid secondhand smoke Avoid SICK contacts Recommend  Masking  when appropriate Recommend Keep up-to-date with vaccinations DuoNebs every 4 hours as needed Follow-up with Dr. Meade  MEDICATION ADJUSTMENTS/LABS AND TESTS ORDERED:    CURRENT MEDICATIONS REVIEWED AT LENGTH WITH PATIENT TODAY   Patient  satisfied with Plan of action and management. All questions answered   Follow up 6 months  I spent a total of 42 minutes dedicated to the care of this patient on the date of this encounter to include pre-visit review of records, face-to-face time with the patient discussing conditions above, post visit ordering of testing, clinical documentation with the electronic health record, making appropriate referrals as documented, and communicating necessary information to the patient's healthcare team.    The Patient requires high complexity decision making for assessment and support, frequent evaluation and titration of therapies, application of advanced monitoring technologies and extensive interpretation  of multiple databases.  Patient satisfied with Plan of action and management. All questions answered    Nickolas Alm Cellar, M.D.  [facility]  Medical Director [facility] Medical Director [facility] Cardio-Pulmonary Department

## 2024-07-03 ENCOUNTER — Encounter: Admitting: Radiology

## 2024-07-09 DIAGNOSIS — M0579 Rheumatoid arthritis with rheumatoid factor of multiple sites without organ or systems involvement: Secondary | ICD-10-CM | POA: Diagnosis not present

## 2024-07-16 ENCOUNTER — Ambulatory Visit (INDEPENDENT_AMBULATORY_CARE_PROVIDER_SITE_OTHER): Admitting: Radiology

## 2024-07-16 ENCOUNTER — Encounter: Payer: Self-pay | Admitting: Radiology

## 2024-07-16 VITALS — BP 134/62 | HR 66 | Ht 63.25 in | Wt 130.0 lb

## 2024-07-16 DIAGNOSIS — Z780 Asymptomatic menopausal state: Secondary | ICD-10-CM

## 2024-07-16 DIAGNOSIS — R079 Chest pain, unspecified: Secondary | ICD-10-CM | POA: Diagnosis not present

## 2024-07-16 DIAGNOSIS — M79641 Pain in right hand: Secondary | ICD-10-CM | POA: Diagnosis not present

## 2024-07-16 DIAGNOSIS — M25512 Pain in left shoulder: Secondary | ICD-10-CM | POA: Diagnosis not present

## 2024-07-16 DIAGNOSIS — R252 Cramp and spasm: Secondary | ICD-10-CM | POA: Diagnosis not present

## 2024-07-16 DIAGNOSIS — M0579 Rheumatoid arthritis with rheumatoid factor of multiple sites without organ or systems involvement: Secondary | ICD-10-CM | POA: Diagnosis not present

## 2024-07-16 DIAGNOSIS — Z6824 Body mass index (BMI) 24.0-24.9, adult: Secondary | ICD-10-CM | POA: Diagnosis not present

## 2024-07-16 DIAGNOSIS — M8580 Other specified disorders of bone density and structure, unspecified site: Secondary | ICD-10-CM

## 2024-07-16 DIAGNOSIS — Z9189 Other specified personal risk factors, not elsewhere classified: Secondary | ICD-10-CM | POA: Diagnosis not present

## 2024-07-16 DIAGNOSIS — N952 Postmenopausal atrophic vaginitis: Secondary | ICD-10-CM

## 2024-07-16 DIAGNOSIS — B009 Herpesviral infection, unspecified: Secondary | ICD-10-CM | POA: Diagnosis not present

## 2024-07-16 DIAGNOSIS — I359 Nonrheumatic aortic valve disorder, unspecified: Secondary | ICD-10-CM | POA: Diagnosis not present

## 2024-07-16 DIAGNOSIS — M79642 Pain in left hand: Secondary | ICD-10-CM | POA: Diagnosis not present

## 2024-07-16 DIAGNOSIS — Z79899 Other long term (current) drug therapy: Secondary | ICD-10-CM | POA: Diagnosis not present

## 2024-07-16 DIAGNOSIS — M1991 Primary osteoarthritis, unspecified site: Secondary | ICD-10-CM | POA: Diagnosis not present

## 2024-07-16 DIAGNOSIS — Z01419 Encounter for gynecological examination (general) (routine) without abnormal findings: Secondary | ICD-10-CM

## 2024-07-16 NOTE — Progress Notes (Signed)
   Courtney Crane Nov 26, 1950 [id number]   History: Postmenopausal 73 y.o. presents for annual exam. Doing well, s/p Total hyst.   Gynecologic History Postmenopausal Last Pap: 2020. Results were: normal Last mammogram: 5/25. Results were: normal Last colonoscopy: 2024 DEXA: 2022 osteopenia HRT use: no  Obstetric History OB History  Gravida Para Term Preterm AB Living  4    1 2   SAB IAB Ectopic Multiple Live Births  1    2    # Outcome Date GA Lbr Len/2nd Weight Sex Type Anes PTL Lv  4 Gravida           3 Gravida           2 Gravida           1 SAB              The following portions of the patient's history were reviewed and updated as appropriate: allergies, current medications, past family history, past medical history, past social history, past surgical history, and problem list.  Review of Systems Pertinent items noted in HPI and remainder of comprehensive ROS otherwise negative.  Past medical history, past surgical history, family history and social history were all reviewed and documented in the EPIC chart.  Exam:  Vitals:   07/16/24 1158  BP: 134/62  Pulse: 66  SpO2: 96%  Weight: 130 lb (59 kg)  Height: 5' 3.25 (1.607 m)   Body mass index is 22.85 kg/m.  General appearance:  Normal Thyroid :  Symmetrical, normal in size, without palpable masses or nodularity. Respiratory  Auscultation:  Clear without wheezing or rhonchi Cardiovascular  Auscultation:  Regular rate, without rubs, murmurs or gallops  Edema/varicosities:  Not grossly evident Abdominal  Soft,nontender, without masses, guarding or rebound.  Liver/spleen:  No organomegaly noted  Hernia:  None appreciated  Skin  Inspection:  Grossly normal Breasts: Examined lying and sitting.   Right: Without masses, retractions, nipple discharge or axillary adenopathy.   Left: Without masses, retractions, nipple discharge or axillary adenopathy. Genitourinary   Inguinal/mons:  Normal without inguinal  adenopathy  External genitalia:  Normal appearing vulva with no masses, tenderness, or lesions  BUS/Urethra/Skene's glands:  Normal  Vagina:  Normal appearing with normal color and discharge, no lesions. Atrophy: moderate. Cystocele, stage 2. #0 incontinence dish pessary replaced easily.  Cervix:  absent  Uterus:  absent  Adnexa/parametria:  absent  Anus and perineum: Normal    Courtney Crane, CMA present for exam  Assessment/Plan:   1. Encounter for breast and pelvic examination (Primary)   2. Osteopenia after menopause - DG Bone Density; Future   Return in 1 year for annual or sooner prn.  Courtney Crane B WHNP-BC, 12:24 PM 07/16/2024

## 2024-07-20 DIAGNOSIS — J4550 Severe persistent asthma, uncomplicated: Secondary | ICD-10-CM | POA: Diagnosis not present

## 2024-07-21 ENCOUNTER — Ambulatory Visit

## 2024-07-21 VITALS — BP 152/70 | HR 88 | Ht 63.25 in | Wt 131.1 lb

## 2024-07-21 DIAGNOSIS — I2510 Atherosclerotic heart disease of native coronary artery without angina pectoris: Secondary | ICD-10-CM | POA: Insufficient documentation

## 2024-07-21 DIAGNOSIS — E782 Mixed hyperlipidemia: Secondary | ICD-10-CM | POA: Diagnosis not present

## 2024-07-21 DIAGNOSIS — I10 Essential (primary) hypertension: Secondary | ICD-10-CM | POA: Diagnosis not present

## 2024-07-21 MED ORDER — CARVEDILOL 3.125 MG PO TABS: 3.1250 mg | ORAL_TABLET | Freq: Two times a day (BID) | ORAL | 3 refills | Status: AC

## 2024-07-21 NOTE — Assessment & Plan Note (Signed)
 Lipid panel from 06/09/2024 LDL 68, HDL 86, total cholesterol 821 and triglycerides 121, well-controlled. Continue rosuvastatin  10 mg once daily.

## 2024-07-21 NOTE — Patient Instructions (Signed)
 Medication Instructions:  Your physician has recommended you make the following change in your medication:   START: Carvedilol 3.125 mg two times daily  *If you need a refill on your cardiac medications before your next appointment, please call your pharmacy*  Lab Work: None If you have labs (blood work) drawn today and your tests are completely normal, you will receive your results only by: MyChart Message (if you have MyChart) OR A paper copy in the mail If you have any lab test that is abnormal or we need to change your treatment, we will call you to review the results.  Testing/Procedures: None  Follow-Up: At [facility], you and your health needs are our priority.  As part of our continuing mission to provide you with exceptional heart care, our providers are all part of one team.  This team includes your primary Cardiologist (physician) and Advanced Practice Providers or APPs (Physician Assistants and Nurse Practitioners) who all work together to provide you with the care you need, when you need it.  Your next appointment:   6 month(s)  Provider:   Huntley Dec, MD    We recommend signing up for the patient portal called "MyChart".  Sign up information is provided on this After Visit Summary.  MyChart is used to connect with patients for Virtual Visits (Telemedicine).  Patients are able to view lab/test results, encounter notes, upcoming appointments, etc.  Non-urgent messages can be sent to your provider as well.   To learn more about what you can do with MyChart, go to [email or web address].   Other Instructions None

## 2024-07-21 NOTE — Assessment & Plan Note (Addendum)
 Suboptimal. Continue amlodipine  10 mg once daily Start carvedilol  3.125 mg twice daily, discussed the beta-blocker effect potential side effects. Continue hydrochlorothiazide  12.5 mg once daily Continue losartan  100 mg once daily. Target blood pressure below 130/80 mmHg.  With elevated LVEDP, continue furosemide  20 mg every other day. Advised her to cut down on salt intake to less than 2 g a day.  Advised her to continue to check blood pressures at home and let us  know if they are not at target.  If doing well follow-up in 6 months.  If suboptimal, will have her come in for an earlier follow-up visit tentatively in 3 months.

## 2024-07-21 NOTE — Assessment & Plan Note (Signed)
 Reassured about the findings. Continue aspirin  81 mg once daily. Continue rosuvastatin  10 mg once daily.  Continue amlodipine  10 mg once daily Add carvedilol  3.125 mg twice daily

## 2024-07-21 NOTE — Progress Notes (Signed)
 Cardiology Consultation:    Date:  07/21/2024   ID:  Courtney Crane, DOB 10-09-1951, MRN [id number]  PCP:  Courtney Hamilton, MD  Cardiologist:  Courtney SAUNDERS Corneilus Heggie, MD   Referring MD: Courtney Hamilton, MD   No chief complaint on file.    ASSESSMENT AND PLAN:   Ms. Courtney Crane 73 year old woman normal coronaries on prior cardiac cath 15 years ago in [place]  and now once again on repeat cardiac cath July 2025 [both times in the setting of evaluation for chest pain], asthma, hypertension, hyperlipidemia, rheumatoid arthritis, GERD, irritable bowel syndrome, iron deficiency anemia, history of diverticulitis. Evaluated for ongoing symptoms of chest discomfort and progressive dyspnea with cardiac cath and echocardiogram and July 2025 which have shown no significant obstructive disease, normal biventricular function was noted without significant valve abnormalities, elevated LVEDP 30 mmHg on cardiac cath with mild luminal irregularities and tortuous vessels noted.  Gradually improved symptoms.  Here for follow-up. Problem List Items Addressed This Visit     Essential hypertension   Suboptimal. Continue amlodipine  10 mg once daily Start carvedilol  3.125 mg twice daily, discussed the beta-blocker effect potential side effects. Continue hydrochlorothiazide  12.5 mg once daily Continue losartan  100 mg once daily. Target blood pressure below 130/80 mmHg.  With elevated LVEDP, continue furosemide  20 mg every other day. Advised her to cut down on salt intake to less than 2 g a day.  Advised her to continue to check blood pressures at home and let us  know if they are not at target.  If doing well follow-up in 6 months.  If suboptimal, will have her come in for an earlier follow-up visit tentatively in 3 months.      Relevant Medications   carvedilol  (COREG ) 3.125 MG tablet   Hyperlipidemia   Lipid panel from 06/09/2024 LDL 68, HDL 86, total cholesterol 821 and triglycerides 121,  well-controlled. Continue rosuvastatin  10 mg once daily.      Relevant Medications   carvedilol  (COREG ) 3.125 MG tablet   Coronary atherosclerosis - Primary   Reassured about the findings. Continue aspirin  81 mg once daily. Continue rosuvastatin  10 mg once daily.  Continue amlodipine  10 mg once daily Add carvedilol  3.125 mg twice daily       Relevant Medications   carvedilol  (COREG ) 3.125 MG tablet  I recommended follow-up with PCP for further optimization of her anxiety management.  Return to clinic in 6 months.  Earlier follow-up in 3 months if blood pressure remains uncontrolled on home checks.  History of Present Illness:    Courtney Crane is a 73 y.o. female who is being seen today for follow-up visit. PCP is Courtney Hamilton, MD.  Seen for last office visit 06/09/2024 by me.  Here for the visit today by herself.  Lives with her husband at home.  Mentions has been stressed out at home with regards to issues concerning her children.  History of normal coronaries on prior cardiac cath 15 years ago in [place]  and now once again on repeat cardiac cath July 2025 [both times in the setting of evaluation for chest pain], asthma, hypertension, hyperlipidemia, rheumatoid arthritis, GERD, irritable bowel syndrome, iron deficiency anemia, history of diverticulitis. At last office visit in the setting of ongoing symptoms of chest pain progressive and associated with dyspnea on exertion evaluated with cardiac cath coronary angiogram and transthoracic echocardiogram.  Cardiac cath coronary angiogram 06/11/2024 noted minimal coronary atherosclerosis with luminal irregularities, elevated LVEDP 30 mmHg.  Severe vessel tortuosity reported.  Microvascular disease could not be evaluated  due to tortuosity.  Transthoracic echocardiogram June 15, 2024 noted normal biventricular function with LVEF 60 to 65%, normal diastolic function, no significant valve abnormality.  Based on these results  recommended she start taking furosemide  20 mg once daily for the first 3 days and after that every other day.  Here for follow-up visit today mentions overall she continues to feel an improvement and reassured by the results. Has been taking medications as prescribed and feels some improvement. Able to walk 1 to 2 miles 3-4 times a week without any significant limitations.  Reports continues to feel a sense of chest discomfort over the upper left aspect of the chest that is reproducible.  Also describes a sense of discomfort on the left underside of the breast that is intermittent. Denies any orthopnea or paroxysmal nocturnal dyspnea. Trace bilateral ankle edema attributed to amlodipine  in the past has not worsened. No claudication symptoms. No blood in urine or stools.  Mentions blood pressures at home have remained uncontrolled ranging from 140s to 170s systolic and diastolic in 90s. Has been compliant with medications.  Overall has been anxious about her health.  Past Medical History:  Diagnosis Date   AKI (acute kidney injury) (HCC) 11/29/2017   Allergy    Anemia    Anxiety    Anxiety disorder 12/06/2014   Arthritis    Asthma    Bloating 09/19/2023   Bursitis of left hip 09/17/2019   Carpal tunnel syndrome of left wrist 04/24/2017   Cataract    Cellulitis and abscess of buttock 11/28/2017   Chest pain on exertion 06/09/2024   Cholelithiasis with chronic cholecystitis 01/30/2017   Chronic diarrhea 09/19/2023   Claudication (HCC) 04/09/2019   Colon polyps    Complication of anesthesia    sometimes I dont like to wake up after  ie difficult to awaken    Disorder of bone 09/23/2019   Diverticulitis    Diverticulosis    DOE (dyspnea on exertion) 04/09/2019   Dysphagia 01/19/2020   Encounter for general adult medical examination without abnormal findings 01/30/2018   Essential hypertension    Uncontrolled, Norvasc  10 MG daily     Exertional chest pain 04/09/2019    Family history of ischemic heart disease (IHD) 09/14/2015   Fatty liver    Flatulence 09/19/2023   Genital herpes simplex 06/06/2016   GERD (gastroesophageal reflux disease)    Hardening of the aorta (main artery of the heart) (HCC) 06/08/2024   Hardening of the aorta (main artery of the heart) (HCC)    History of right shoulder replacement 12/06/2022   History of shingles 06/06/2016   HTN (hypertension)    Hyperlipidemia 12/06/2014   IBS (irritable bowel syndrome)    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   Insomnia, unspecified    On Melatonin, controlled     Iron deficiency anemia 12/06/2014   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   Leukocytosis 12/04/2017   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   MRSA infection    Muscle weakness 05/11/2020   Myopathy 03/31/2019   Osteoarthritis    Osteopenia    Overactive bladder 06/08/2024   Pain 12/05/2019   Pain involving joint of finger of left hand 04/26/2017   Pain of left lower extremity 08/22/2020   PONV (postoperative nausea and vomiting)    Poorly controlled persistent asthma 05/17/2022   Prediabetes    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   Rheumatoid arthritis (HCC)    Right shoulder pain 12/06/2022   S/P  lumbar fusion 01/23/2021   Seasonal allergies    Seborrheic dermatitis    Shingles    Spinal stenosis of lumbar region 06/08/2024   Status post dilation of esophageal narrowing    Strain of muscle, fascia and tendon of the posterior muscle group at thigh level, right thigh, subsequent encounter 08/15/2020   Thyroid  nodule 06/08/2024   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   Type 2 diabetes mellitus with other specified complication (HCC) 06/08/2024   Uncomplicated asthma 12/06/2014   Vitamin D  deficiency     Past Surgical History:  Procedure Laterality Date   ADENOIDECTOMY     APPENDECTOMY  1975   bladder tack  1990   CARDIAC CATHETERIZATION  2014   [place]   CATARACT  EXTRACTION, BILATERAL Bilateral    CHOLECYSTECTOMY N/A 01/31/2017   Procedure: LAPAROSCOPIC CHOLECYSTECTOMY WITH INTRAOPERATIVE CHOLANGIOGRAM;  Surgeon: Krystal Spinner, MD;  Location: [facility];  Service: General;  Laterality: N/A;   EYE SURGERY  September 2020   INCISION AND DRAINAGE PERIRECTAL ABSCESS N/A 12/02/2017   Procedure: IRRIGATION AND DEBRIDEMENT PERINEAL ABSCESS;  Surgeon: Tanda Locus, MD;  Location: [facility];  Service: General;  Laterality: N/A;   JOINT REPLACEMENT  2011, 11-2022   LEFT HEART CATH AND CORONARY ANGIOGRAPHY N/A 06/11/2024   Procedure: LEFT HEART CATH AND CORONARY ANGIOGRAPHY;  Surgeon: Elmira Newman PARAS, MD;  Location: [facility];  Service: Cardiovascular;  Laterality: N/A;   LUMBAR FUSION     MENISCUS REPAIR Right 2013   REVERSE SHOULDER ARTHROPLASTY Right 12/06/2022   Procedure: RIGHT REVERSE SHOULDER ARTHROPLASTY;  Surgeon: Dozier Soulier, MD;  Location: [facility];  Service: Orthopedics;  Laterality: Right;   ROTATOR CUFF REPAIR Right 2005   SPINE SURGERY  March 2022   TONSILLECTOMY     as a child   TOTAL ABDOMINAL HYSTERECTOMY  1988   TOTAL KNEE ARTHROPLASTY Left    WRIST RECONSTRUCTION Right 2008    Current Medications: Current Meds  Medication Sig   acetaminophen  (TYLENOL ) 500 MG tablet Take 1,000 mg by mouth every 6 (six) hours as needed for mild pain (pain score 1-3) or moderate pain (pain score 4-6).   albuterol  (2.5 MG/3ML) 0.083% NEBU 3 mL, albuterol  (5 MG/ML) 0.5% NEBU 0.5 mL Inhale 5 mg into the lungs every 6 (six) hours as needed (asthma).   Albuterol -Budesonide (AIRSUPRA ) 90-80 MCG/ACT AERO Inhale 2 puffs into the lungs every 4 (four) hours as needed.   amLODipine  (NORVASC ) 10 MG tablet Take 10 mg by mouth in the morning.   Ascorbic Acid  (VITAMIN C) 1000 MG tablet Take 1,000 mg by mouth daily.   ASPIRIN  81 PO    Biotin 10 MG CHEW    bismuth subsalicylate (PEPTO BISMOL) 262 MG chewable tablet Chew 262-524 mg by mouth 3 (three) times daily as  needed for diarrhea or loose stools or indigestion.   budeson-glycopyrrolate-formoterol (BREZTRI  AEROSPHERE) 160-9-4.8 MCG/ACT AERO inhaler Inhale 2 puffs into the lungs in the morning and at bedtime.   Calcium  Carb-Cholecalciferol  (CALCIUM  600+D3 PO) Take 1 tablet by mouth in the morning.   calcium  elemental as carbonate (TUMS ULTRA 1000) 400 MG chewable tablet Chew 1,000 mg by mouth 3 (three) times daily as needed for heartburn.   CANNABIDIOL PO Take 1 capsule by mouth at bedtime. CBD   carvedilol  (COREG ) 3.125 MG tablet Take 1 tablet (3.125 mg total) by mouth 2 (two) times daily.   cetirizine  (ZYRTEC ) 10 MG tablet Take 10 mg by mouth daily as needed for allergies.  Cholecalciferol  (VITAMIN D -3) 125 MCG (5000 UT) TABS Take 5,000 Units by mouth in the morning.   Dupilumab  (DUPIXENT ) 300 MG/2ML SOAJ Inject 300 mg into the skin every 14 (fourteen) days.   famotidine  (PEPCID ) 40 MG tablet TAKE 1 TABLET BY MOUTH EVERYDAY AT BEDTIME   Ferrous Sulfate ([employer] SLOW RELEASE IRON PO) Take 45 mg by mouth every other day.   fexofenadine  (ALLEGRA ) 180 MG tablet Take 0.5 tablets (90 mg total) by mouth in the morning.   fluocinonide  cream (LIDEX ) 0.05 % SMARTSIG:1 sparingly Topical Twice Daily   Fluticasone Propionate  (XHANCE ) 93 MCG/ACT EXHU Place 2 sprays into the nose 2 (two) times daily as needed.   furosemide  (LASIX ) 20 MG tablet Take 20 mg (1 tablet) daily for 3 days and then decrease to 20 mg (1 tablet) every other day.   hydrochlorothiazide  (HYDRODIURIL ) 12.5 MG tablet Take 12.5 mg by mouth every morning.   inFLIXimab  (REMICADE ) 100 MG injection Inject into the vein every 5 (five) weeks.   ipratropium (ATROVENT ) 0.06 % nasal spray Place 1 spray into both nostrils daily.   ipratropium-albuterol  (DUONEB) 0.5-2.5 (3) MG/3ML SOLN Take 3 mLs by nebulization every 4 (four) hours as needed.   leflunomide (ARAVA) 20 MG tablet Take 20 mg by mouth every evening.   losartan  (COZAAR ) 100 MG tablet Take 100 mg by  mouth daily.   magnesium gluconate (MAGONATE) 500 (27 Mg) MG TABS tablet Take 500 mg by mouth daily.   MELATONIN PO Take 1.5 mg by mouth at bedtime as needed (sleep).   mirabegron  ER (MYRBETRIQ ) 25 MG TB24 tablet Take 1 tablet (25 mg total) by mouth daily.   Multiple Vitamins-Minerals (IMMUNE SUPPORT VITAMIN C PO) Take 1 tablet by mouth in the morning. With Vitamin D  3/Vitamin A/Magnesium   nitroGLYCERIN  (NITROSTAT ) 0.4 MG SL tablet Place 1 tablet (0.4 mg total) under the tongue every 5 (five) minutes as needed.   Omega-3 Fatty Acids (FISH OIL) 1200 MG CAPS Take 1,200 mg by mouth 2 (two) times daily.   omeprazole  (PRILOSEC) 40 MG capsule TAKE 1 CAPSULE (40 MG TOTAL) BY MOUTH 2 (TWO) TIMES DAILY BEFORE A MEAL.   polyethylene glycol (MIRALAX  / GLYCOLAX ) 17 g packet Take 17 g by mouth daily as needed for moderate constipation. In the morning   Psyllium (METAMUCIL 4 IN 1 FIBER PO) Take 2 capsules by mouth in the morning. Metamucil 3-in-1 Psyllium Fiber Supplement Capsules   rosuvastatin  (CRESTOR ) 10 MG tablet Take 10 mg by mouth every evening.   simethicone (MYLICON) 125 MG chewable tablet Chew 125 mg by mouth every 6 (six) hours as needed for flatulence.   sodium chloride  (OCEAN) 0.65 % SOLN nasal spray Place 1 spray into both nostrils in the morning and at bedtime.   temazepam (RESTORIL) 15 MG capsule Take 15 mg by mouth at bedtime.   tretinoin (RETIN-A) 0.025 % cream Apply 1 Application topically at bedtime.   valACYclovir  (VALTREX ) 500 MG tablet Take 500 mg by mouth daily as needed (outbreaks).     Allergies:   Meperidine, Methotrexate, Nickel, and Tape   Social History   Socioeconomic History   Marital status: Married    Spouse name: Not on file   Number of children: 2   Years of education: Not on file   Highest education level: Not on file  Occupational History   Occupation: retired  Tobacco Use   Smoking status: Never    Passive exposure: Never   Smokeless tobacco: Never  Vaping  Use  Vaping status: Never Used  Substance and Sexual Activity   Alcohol  use: Yes    Alcohol /week: 7.0 standard drinks of alcohol     Types: 7 Standard drinks or equivalent per week    Comment: wine daily   Drug use: No    Comment: takes CBD oil   Sexual activity: Not Currently    Partners: Male    Birth control/protection: Post-menopausal    Comment: Hx HSV, High RISK Medicare, no to all other Medicare screening questions, less than 5 lifetime partners  Other Topics Concern   Not on file  Social History Narrative   Retired from [employer]   Lives at home with husband ; one level home   Right handed   Highest level of education:  Some college   Social Drivers of Corporate investment banker Strain: Not on file  Food Insecurity: Not on file  Transportation Needs: Not on file  Physical Activity: Not on file  Stress: Not on file  Social Connections: Not on file     Family History: The patient's family history includes Arthritis in her mother; CAD in her brother; Cancer in her mother; Colon polyps in her mother and son; Dementia in her father; Diabetes in her mother; Esophageal cancer in her brother; Hearing loss in her mother; Heart attack in her mother; Heart disease in her mother; Hyperlipidemia in her mother; Hypertension in her mother; Kidney cancer in her mother; Kidney disease in her mother. There is no history of Colon cancer, Stomach cancer, Rectal cancer, Prostate cancer, or Pancreatic cancer. ROS:   Please see the history of present illness.    All 14 point review of systems negative except as described per history of present illness.  EKGs/Labs/Other Studies Reviewed:    The following studies were reviewed today:   EKG:       Recent Labs: 10/12/2023: ALT 63; TSH 2.922 06/11/2024: BUN 14; Creatinine, Ser 0.85; Hemoglobin 13.8; Platelets 155; Potassium 4.4; Sodium 139  Recent Lipid Panel No results found for: CHOL, TRIG, HDL, CHOLHDL, VLDL, LDLCALC,  LDLDIRECT  Physical Exam:    VS:  BP (!) 152/70   Pulse 88   Ht 5' 3.25 (1.607 m)   Wt 131 lb 1.3 oz (59.5 kg)   SpO2 98%   BMI 23.04 kg/m     Wt Readings from Last 3 Encounters:  07/21/24 131 lb 1.3 oz (59.5 kg)  07/16/24 130 lb (59 kg)  07/02/24 133 lb (60.3 kg)     GENERAL:  Well nourished, well developed in no acute distress NECK: No JVD; No carotid bruits CARDIAC: RRR, S1 and S2 present, no murmurs, no rubs, no gallops CHEST:  Clear to auscultation without rales, wheezing or rhonchi  Extremities: No pitting pedal edema. Pulses bilaterally symmetric with radial 2+ and dorsalis pedis 2+ NEUROLOGIC:  Alert and oriented x 3  Medication Adjustments/Labs and Tests Ordered: Current medicines are reviewed at length with the patient today.  Concerns regarding medicines are outlined above.  No orders of the defined types were placed in this encounter.  Meds ordered this encounter  Medications   carvedilol  (COREG ) 3.125 MG tablet    Sig: Take 1 tablet (3.125 mg total) by mouth 2 (two) times daily.    Dispense:  180 tablet    Refill:  3    Signed, Ryin Schillo reddy Amed Datta, MD, MPH, [facility]. 07/21/2024 2:48 PM    [facility]

## 2024-07-22 ENCOUNTER — Other Ambulatory Visit: Payer: Self-pay

## 2024-07-23 ENCOUNTER — Other Ambulatory Visit: Payer: Self-pay

## 2024-07-23 ENCOUNTER — Encounter (INDEPENDENT_AMBULATORY_CARE_PROVIDER_SITE_OTHER): Payer: Self-pay

## 2024-07-23 ENCOUNTER — Other Ambulatory Visit: Payer: Self-pay | Admitting: Pharmacy Technician

## 2024-07-23 ENCOUNTER — Ambulatory Visit (HOSPITAL_COMMUNITY)
Admission: RE | Admit: 2024-07-23 | Discharge: 2024-07-23 | Disposition: A | Discharge status: Home / Self Care | Source: Ambulatory Visit | Attending: Radiology | Admitting: Radiology

## 2024-07-23 DIAGNOSIS — M8580 Other specified disorders of bone density and structure, unspecified site: Secondary | ICD-10-CM | POA: Diagnosis not present

## 2024-07-23 DIAGNOSIS — Z780 Asymptomatic menopausal state: Secondary | ICD-10-CM | POA: Insufficient documentation

## 2024-07-23 DIAGNOSIS — M810 Age-related osteoporosis without current pathological fracture: Secondary | ICD-10-CM | POA: Diagnosis not present

## 2024-07-23 NOTE — Progress Notes (Signed)
 Clinical Intervention Note  Clinical Intervention Notes: Patient started on Coreg  (carvedilol ). While there are no drug-drug interactions, Coreg  is generally not recommended in patients with asthma due to the potential risk of bronchoconstriction. She was initiated on a low dose of 3.125 mg twice daily. I contacted the patient to counsel her on this drug-disease interaction, and she reported that the dispensing pharmacist also provided counseling. She is aware of the potential for worsening asthma symptoms and will notify her provider immediately if they occur. I informed her that more cardioselective beta-blockers may be considered if issues arise in the future. She was receptive and expressed understanding.   Clinical Intervention Outcomes: Prevention of an adverse drug event   Silvano LOISE Blair Karel Santa

## 2024-07-23 NOTE — Progress Notes (Signed)
 Specialty Pharmacy Refill Coordination Note  Courtney Crane is a 73 y.o. female contacted today regarding refills of specialty medication(s) Dupilumab  (Dupixent )   Patient requested (Patient-Rptd) Delivery   Delivery date: 08/06/24 Verified address: (Patient-Rptd) [address] [place], [place] [address]   Medication will be filled on 08/05/24.

## 2024-07-24 DIAGNOSIS — D84821 Immunodeficiency due to drugs: Secondary | ICD-10-CM | POA: Diagnosis not present

## 2024-07-24 DIAGNOSIS — F330 Major depressive disorder, recurrent, mild: Secondary | ICD-10-CM | POA: Diagnosis not present

## 2024-07-24 DIAGNOSIS — K580 Irritable bowel syndrome with diarrhea: Secondary | ICD-10-CM | POA: Diagnosis not present

## 2024-07-24 DIAGNOSIS — M069 Rheumatoid arthritis, unspecified: Secondary | ICD-10-CM | POA: Diagnosis not present

## 2024-07-24 DIAGNOSIS — F418 Other specified anxiety disorders: Secondary | ICD-10-CM | POA: Diagnosis not present

## 2024-07-31 ENCOUNTER — Ambulatory Visit: Payer: Self-pay | Admitting: Radiology

## 2024-08-04 ENCOUNTER — Inpatient Hospital Stay (HOSPITAL_BASED_OUTPATIENT_CLINIC_OR_DEPARTMENT_OTHER): Admitting: Internal Medicine

## 2024-08-04 ENCOUNTER — Inpatient Hospital Stay: Attending: Internal Medicine

## 2024-08-04 VITALS — BP 116/56 | HR 63 | Temp 97.3°F | Resp 17 | Ht 63.25 in | Wt 130.3 lb

## 2024-08-04 DIAGNOSIS — K5790 Diverticulosis of intestine, part unspecified, without perforation or abscess without bleeding: Secondary | ICD-10-CM | POA: Insufficient documentation

## 2024-08-04 DIAGNOSIS — D508 Other iron deficiency anemias: Secondary | ICD-10-CM

## 2024-08-04 DIAGNOSIS — K589 Irritable bowel syndrome without diarrhea: Secondary | ICD-10-CM | POA: Insufficient documentation

## 2024-08-04 DIAGNOSIS — D500 Iron deficiency anemia secondary to blood loss (chronic): Secondary | ICD-10-CM | POA: Diagnosis not present

## 2024-08-04 LAB — CBC WITH DIFFERENTIAL (CANCER CENTER ONLY)
Abs Immature Granulocytes: 0.01 K/uL (ref 0.00–0.07)
Basophils Absolute: 0.1 K/uL (ref 0.0–0.1)
Basophils Relative: 1 %
Eosinophils Absolute: 0.1 K/uL (ref 0.0–0.5)
Eosinophils Relative: 1 %
HCT: 39.7 % (ref 36.0–46.0)
Hemoglobin: 13.4 g/dL (ref 12.0–15.0)
Immature Granulocytes: 0 %
Lymphocytes Relative: 24 %
Lymphs Abs: 1.6 K/uL (ref 0.7–4.0)
MCH: 32.6 pg (ref 26.0–34.0)
MCHC: 33.8 g/dL (ref 30.0–36.0)
MCV: 96.6 fL (ref 80.0–100.0)
Monocytes Absolute: 0.7 K/uL (ref 0.1–1.0)
Monocytes Relative: 10 %
Neutro Abs: 4.3 K/uL (ref 1.7–7.7)
Neutrophils Relative %: 64 %
Platelet Count: 152 K/uL (ref 150–400)
RBC: 4.11 MIL/uL (ref 3.87–5.11)
RDW: 14 % (ref 11.5–15.5)
WBC Count: 6.8 K/uL (ref 4.0–10.5)
nRBC: 0 % (ref 0.0–0.2)

## 2024-08-04 LAB — IRON AND IRON BINDING CAPACITY (CC-WL,HP ONLY)
Iron: 126 ug/dL (ref 28–170)
Saturation Ratios: 24 % (ref 10.4–31.8)
TIBC: 518 ug/dL — ABNORMAL HIGH (ref 250–450)
UIBC: 392 ug/dL (ref 148–442)

## 2024-08-04 LAB — FERRITIN: Ferritin: 55 ng/mL (ref 11–307)

## 2024-08-04 NOTE — Progress Notes (Signed)
 [facility] [phone]   Fax:([phone]  OFFICE PROGRESS NOTE  Courtney Hamilton, MD [address] [place] [address]  DIAGNOSIS: Iron deficiency anemia with history of IBS and diverticulosis.  PRIOR THERAPY: Iron infusion in the past  CURRENT THERAPY: Over-the-counter ferrous sulfate with vitamin C  INTERVAL HISTORY: Courtney Crane 73 y.o. female returns to the clinic today for follow-up visit.  Discussed the use of AI scribe software for clinical note transcription with the patient, who gave verbal consent to proceed.  History of Present Illness Courtney Crane is a 73 year old female who presents for evaluation and repeat blood work.  Over the past six months, she has experienced significant difficulty walking, particularly noted during a cruise to [place] . Initially, she attributed this to her asthma, but subsequent evaluation revealed her asthma was stable.  She was referred to a cardiologist, where an echocardiogram and cardiac catheterization were performed. These tests showed high pressure inside the heart and aortic hardening. She was started on blood pressure medication and a diuretic, which have recently helped lower her blood pressure.  She is managing her iron deficiency anemia with over-the-counter ferrous sulfate and vitamin C every other day. Her hemoglobin level is 13.4. She is awaiting results for her iron and ferritin levels.  She feels tired in the evenings and is currently on multiple medications, including Coreg , Zyrtec , and Norvasc .    MEDICAL HISTORY: Past Medical History:  Diagnosis Date   AKI (acute kidney injury) (HCC) 11/29/2017   Allergy    Anemia    Anxiety    Anxiety disorder 12/06/2014   Arthritis    Asthma    Bloating 09/19/2023   Bursitis of left hip 09/17/2019   Carpal tunnel syndrome of left wrist 04/24/2017   Cataract    Cellulitis and abscess of buttock 11/28/2017   Chest pain on  exertion 06/09/2024   Cholelithiasis with chronic cholecystitis 01/30/2017   Chronic diarrhea 09/19/2023   Claudication (HCC) 04/09/2019   Colon polyps    Complication of anesthesia    sometimes I dont like to wake up after  ie difficult to awaken    Disorder of bone 09/23/2019   Diverticulitis    Diverticulosis    DOE (dyspnea on exertion) 04/09/2019   Dysphagia 01/19/2020   Encounter for general adult medical examination without abnormal findings 01/30/2018   Essential hypertension    Uncontrolled, Norvasc  10 MG daily     Exertional chest pain 04/09/2019   Family history of ischemic heart disease (IHD) 09/14/2015   Fatty liver    Flatulence 09/19/2023   Genital herpes simplex 06/06/2016   GERD (gastroesophageal reflux disease)    Hardening of the aorta (main artery of the heart) (HCC) 06/08/2024   Hardening of the aorta (main artery of the heart) (HCC)    History of right shoulder replacement 12/06/2022   History of shingles 06/06/2016   HTN (hypertension)    Hyperlipidemia 12/06/2014   IBS (irritable bowel syndrome)    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   Insomnia, unspecified    On Melatonin, controlled     Iron deficiency anemia 12/06/2014   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   Leukocytosis 12/04/2017   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   MRSA infection    Muscle weakness 05/11/2020   Myopathy 03/31/2019   Osteoarthritis    Osteopenia    Overactive bladder 06/08/2024   Pain 12/05/2019   Pain involving joint of  finger of left hand 04/26/2017   Pain of left lower extremity 08/22/2020   PONV (postoperative nausea and vomiting)    Poorly controlled persistent asthma 05/17/2022   Prediabetes    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   Rheumatoid arthritis (HCC)    Right shoulder pain 12/06/2022   S/P lumbar fusion 01/23/2021   Seasonal allergies    Seborrheic dermatitis    Shingles    Spinal stenosis of lumbar region  06/08/2024   Status post dilation of esophageal narrowing    Strain of muscle, fascia and tendon of the posterior muscle group at thigh level, right thigh, subsequent encounter 08/15/2020   Thyroid  nodule 06/08/2024   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  MEDICATIONS:  Current Outpatient Medications  Medication Sig Dispense Refill   escitalopram (LEXAPRO) 5 MG tablet Take 5 mg by mouth daily.     acetaminophen  (TYLENOL ) 500 MG tablet Take 1,000 mg by mouth every 6 (six) hours as needed for mild pain (pain score 1-3) or moderate pain (pain score 4-6).     albuterol  (2.5 MG/3ML) 0.083% NEBU 3 mL, albuterol  (5 MG/ML) 0.5% NEBU 0.5 mL Inhale 5 mg into the lungs every 6 (six) hours as needed (asthma).     Albuterol -Budesonide (AIRSUPRA ) 90-80 MCG/ACT AERO Inhale 2 puffs into the lungs every 4 (four) hours as needed. 10.7 g 2   amLODipine  (NORVASC ) 10 MG tablet Take 10 mg by mouth in the morning.     Ascorbic Acid  (VITAMIN C) 1000 MG tablet Take 1,000 mg by mouth daily.     ASPIRIN  81 PO      Biotin 10 MG CHEW      bismuth subsalicylate (PEPTO BISMOL) 262 MG chewable tablet Chew 262-524 mg by mouth 3 (three) times daily as needed for diarrhea or loose stools or indigestion.     budeson-glycopyrrolate-formoterol (BREZTRI  AEROSPHERE) 160-9-4.8 MCG/ACT AERO inhaler Inhale 2 puffs into the lungs in the morning and at bedtime. 10.7 g 5   Calcium  Carb-Cholecalciferol  (CALCIUM  600+D3 PO) Take 1 tablet by mouth in the morning.     calcium  elemental as carbonate (TUMS ULTRA 1000) 400 MG chewable tablet Chew 1,000 mg by mouth 3 (three) times daily as needed for heartburn.     CANNABIDIOL PO Take 1 capsule by mouth at bedtime. CBD     carvedilol  (COREG ) 3.125 MG tablet Take 1 tablet (3.125 mg  total) by mouth 2 (two) times daily. 180 tablet 3   cetirizine  (ZYRTEC ) 10 MG tablet Take 10 mg by mouth daily as needed for allergies.     Cholecalciferol  (VITAMIN D -3) 125 MCG (5000 UT) TABS Take 5,000 Units by mouth in the morning.     Dupilumab  (DUPIXENT ) 300 MG/2ML SOAJ Inject 300 mg into the skin every 14 (fourteen) days. 12 mL 2   famotidine  (PEPCID ) 40 MG tablet TAKE 1 TABLET BY MOUTH EVERYDAY AT BEDTIME 90 tablet 3   Ferrous Sulfate ([employer] SLOW RELEASE IRON PO) Take 45 mg by mouth every other day.     fexofenadine  (ALLEGRA ) 180 MG tablet Take 0.5 tablets (90 mg total) by mouth in the morning. 30 tablet 5   fluocinonide  cream (LIDEX ) 0.05 % SMARTSIG:1 sparingly Topical Twice Daily     Fluticasone Propionate  (XHANCE ) 93 MCG/ACT EXHU Place 2 sprays into the  nose 2 (two) times daily as needed. 32 mL 5   furosemide  (LASIX ) 20 MG tablet Take 20 mg (1 tablet) daily for 3 days and then decrease to 20 mg (1 tablet) every other day. 90 tablet 3   hydrochlorothiazide  (HYDRODIURIL ) 12.5 MG tablet Take 12.5 mg by mouth every morning.     inFLIXimab  (REMICADE ) 100 MG injection Inject into the vein every 5 (five) weeks.     ipratropium (ATROVENT ) 0.06 % nasal spray Place 1 spray into both nostrils daily. 15 mL 5   ipratropium-albuterol  (DUONEB) 0.5-2.5 (3) MG/3ML SOLN Take 3 mLs by nebulization every 4 (four) hours as needed. 360 mL 10   leflunomide (ARAVA) 20 MG tablet Take 20 mg by mouth every evening.     losartan  (COZAAR ) 100 MG tablet Take 100 mg by mouth daily.     magnesium gluconate (MAGONATE) 500 (27 Mg) MG TABS tablet Take 500 mg by mouth daily.     MELATONIN PO Take 1.5 mg by mouth at bedtime as needed (sleep).     mirabegron  ER (MYRBETRIQ ) 25 MG TB24 tablet Take 1 tablet (25 mg total) by mouth daily. 90 tablet 3   Multiple Vitamins-Minerals (IMMUNE SUPPORT VITAMIN C PO) Take 1 tablet by mouth in the morning. With Vitamin D  3/Vitamin A/Magnesium     nitroGLYCERIN  (NITROSTAT ) 0.4 MG SL tablet  Place 1 tablet (0.4 mg total) under the tongue every 5 (five) minutes as needed. 25 tablet 6   Omega-3 Fatty Acids (FISH OIL) 1200 MG CAPS Take 1,200 mg by mouth 2 (two) times daily.     omeprazole  (PRILOSEC) 40 MG capsule TAKE 1 CAPSULE (40 MG TOTAL) BY MOUTH 2 (TWO) TIMES DAILY BEFORE A MEAL. 180 capsule 1   polyethylene glycol (MIRALAX  / GLYCOLAX ) 17 g packet Take 17 g by mouth daily as needed for moderate constipation. In the morning     Psyllium (METAMUCIL 4 IN 1 FIBER PO) Take 2 capsules by mouth in the morning. Metamucil 3-in-1 Psyllium Fiber Supplement Capsules     rosuvastatin  (CRESTOR ) 10 MG tablet Take 10 mg by mouth every evening.     simethicone (MYLICON) 125 MG chewable tablet Chew 125 mg by mouth every 6 (six) hours as needed for flatulence.     sodium chloride  (OCEAN) 0.65 % SOLN nasal spray Place 1 spray into both nostrils in the morning and at bedtime.     temazepam (RESTORIL) 15 MG capsule Take 15 mg by mouth at bedtime.     tretinoin (RETIN-A) 0.025 % cream Apply 1 Application topically at bedtime.     valACYclovir  (VALTREX ) 500 MG tablet Take 500 mg by mouth daily as needed (outbreaks).     No current facility-administered medications for this visit.    SURGICAL HISTORY:  Past Surgical History:  Procedure Laterality Date   ADENOIDECTOMY     APPENDECTOMY  1975   bladder tack  1990   CARDIAC CATHETERIZATION  2014   [place]   CATARACT EXTRACTION, BILATERAL Bilateral    CHOLECYSTECTOMY N/A 01/31/2017   Procedure: LAPAROSCOPIC CHOLECYSTECTOMY WITH INTRAOPERATIVE CHOLANGIOGRAM;  Surgeon: Krystal Spinner, MD;  Location: [facility];  Service: General;  Laterality: N/A;   EYE SURGERY  September 2020   INCISION AND DRAINAGE PERIRECTAL ABSCESS N/A 12/02/2017   Procedure: IRRIGATION AND DEBRIDEMENT PERINEAL ABSCESS;  Surgeon: Tanda Locus, MD;  Location: [facility] OR;  Service: General;  Laterality: N/A;   JOINT REPLACEMENT  2011, 11-2022   LEFT HEART CATH AND CORONARY ANGIOGRAPHY N/A  06/11/2024  Procedure: LEFT HEART CATH AND CORONARY ANGIOGRAPHY;  Surgeon: Elmira Newman PARAS, MD;  Location: [facility] INVASIVE CV LAB;  Service: Cardiovascular;  Laterality: N/A;   LUMBAR FUSION     MENISCUS REPAIR Right 2013   REVERSE SHOULDER ARTHROPLASTY Right 12/06/2022   Procedure: RIGHT REVERSE SHOULDER ARTHROPLASTY;  Surgeon: Dozier Soulier, MD;  Location: [facility];  Service: Orthopedics;  Laterality: Right;   ROTATOR CUFF REPAIR Right 2005   SPINE SURGERY  March 2022   TONSILLECTOMY     as a child   TOTAL ABDOMINAL HYSTERECTOMY  1988   TOTAL KNEE ARTHROPLASTY Left    WRIST RECONSTRUCTION Right 2008    REVIEW OF SYSTEMS:  A comprehensive review of systems was negative.   PHYSICAL EXAMINATION: General appearance: alert, appears stated age, and no distress Head: Normocephalic, without obvious abnormality, atraumatic Neck: no adenopathy, no JVD, supple, symmetrical, trachea midline, and thyroid  not enlarged, symmetric, no tenderness/mass/nodules Lymph nodes: Cervical, supraclavicular, and axillary nodes normal. Resp: clear to auscultation bilaterally Back: symmetric, no curvature. ROM normal. No CVA tenderness. Cardio: regular rate and rhythm, S1, S2 normal, no murmur, click, rub or gallop GI: soft, non-tender; bowel sounds normal; no masses,  no organomegaly Extremities: extremities normal, atraumatic, no cyanosis or edema  ECOG PERFORMANCE STATUS: 0 - Asymptomatic  Blood pressure (!) 116/56, pulse 63, temperature (!) 97.3 F (36.3 C), resp. rate 17, height 5' 3.25 (1.607 m), weight 130 lb 4.8 oz (59.1 kg), SpO2 98%.  LABORATORY DATA: Lab Results  Component Value Date   WBC 6.8 08/04/2024   HGB 13.4 08/04/2024   HCT 39.7 08/04/2024   MCV 96.6 08/04/2024   PLT 152 08/04/2024      Chemistry      Component Value Date/Time   NA 139 06/11/2024 1020   K 4.4 06/11/2024 1020   CL 104 06/11/2024 1020   CO2 26 06/11/2024 1020   BUN 14 06/11/2024 1020   CREATININE 0.85  06/11/2024 1020   CREATININE 0.88 10/12/2023 1105      Component Value Date/Time   CALCIUM  9.5 06/11/2024 1020   ALKPHOS 62 10/12/2023 1105   AST 28 10/12/2023 1105   ALT 63 (H) 10/12/2023 1105   BILITOT 0.4 10/12/2023 1105       RADIOGRAPHIC STUDIES: DG Bone Density Result Date: 07/24/2024 EXAM: DUAL X-RAY ABSORPTIOMETRY (DXA) FOR BONE MINERAL DENSITY 07/23/2024 12:34 pm CLINICAL DATA:  73 year old Female Postmenopausal. Osteopenia History of fragility fracture. Patient is or has been on glucocorticoid therapy. Patient is or has been on bone building therapies. TECHNIQUE: An axial (e.g., hips, spine) and/or appendicular (e.g., radius) exam was performed, as appropriate, using GE Psychologist, sport and exercise at [facility]. Images are obtained for bone mineral density measurement and are not obtained for diagnostic purposes. [id number] Exclusions: Lumbar spine due to surgical repair. COMPARISON:  None. FINDINGS: Scan quality: Good. LEFT FEMORAL NECK: BMD (in g/cm2): 0.735 T-score: -2.2 Z-score: -0.3 LEFT TOTAL HIP: BMD (in g/cm2): 0.689 T-score: -2.5 Z-score: -0.9 RIGHT FEMORAL NECK: BMD (in g/cm2): 0.796 T-score: -1.7 Z-score: 0.1 RIGHT TOTAL HIP: BMD (in g/cm2): 0.761 T-score: -2.0 Z-score: -0.3 LEFT FOREARM (RADIUS 33%): BMD (in g/cm2): 0.543 T-score: -2.3 Z-score: -0.2 FRAX 10-YEAR PROBABILITY OF FRACTURE: FRAX not reported as the lowest BMD is not in the osteopenia range. IMPRESSION: Osteoporosis based on BMD. Fracture risk is unknown due to history of bone building therapy. RECOMMENDATIONS: 1. All patients should optimize calcium  and vitamin D  intake. 2. Consider FDA-approved medical therapies in postmenopausal women  and men aged 40 years and older, based on the following: - A hip or vertebral (clinical or morphometric) fracture - T-score less than or equal to -2.5 and secondary causes have been excluded. - Low bone mass (T-score between -1.0 and -2.5) and a 10-year probability of a hip  fracture greater than or equal to 3% or a 10-year probability of a major osteoporosis-related fracture greater than or equal to 20% based on the [place] -adapted WHO algorithm. - Clinician judgment and/or patient preferences may indicate treatment for people with 10-year fracture probabilities above or below these levels 3. Patients with diagnosis of osteoporosis or at high risk for fracture should have regular bone mineral density tests. For patients eligible for Medicare, routine testing is allowed once every 2 years. The testing frequency can be increased to one year for patients who have rapidly progressing disease, those who are receiving or discontinuing medical therapy to restore bone mass, or have additional risk factors. Electronically Signed   By: Harrietta Sherry M.D.   On: 07/24/2024 15:40    ASSESSMENT AND PLAN: This is a very pleasant 73 years old white female with history of iron deficiency anemia secondary to blood loss from IBS and diverticulosis.  She is currently on over-the-counter ferrous sulfate every other day with orange juice and tolerating it fairly well. Repeat CBC today is unremarkable.  Iron study and ferritin are still pending Assessment and Plan Assessment & Plan Iron deficiency anemia Iron deficiency anemia is well-managed with a hemoglobin level of 13.4 and no CBC abnormalities. Awaiting iron and ferritin levels. Current management with ferrous sulfate and vitamin C is effective. - Continue ferrous sulfate with vitamin C every other day. - Discharge from regular follow-up with hematology and oncology.  I will see her on as-needed basis at this point. - Instruct to return if iron levels drop significantly or if iron infusion is needed. She was advised to call immediately if she has any other concerning symptoms in the interval. The patient voices understanding of current disease status and treatment options and is in agreement with the current care plan.  All questions were  answered. The patient knows to call the clinic with any problems, questions or concerns. We can certainly see the patient much sooner if necessary.  The total time spent in the appointment was 20 minutes.  Disclaimer: This note was dictated with voice recognition software. Similar sounding words can inadvertently be transcribed and may not be corrected upon review.

## 2024-08-05 ENCOUNTER — Other Ambulatory Visit: Payer: Self-pay

## 2024-08-06 ENCOUNTER — Encounter: Payer: Self-pay | Admitting: Internal Medicine

## 2024-08-13 DIAGNOSIS — R5383 Other fatigue: Secondary | ICD-10-CM | POA: Diagnosis not present

## 2024-08-13 DIAGNOSIS — Z79899 Other long term (current) drug therapy: Secondary | ICD-10-CM | POA: Diagnosis not present

## 2024-08-13 DIAGNOSIS — M0579 Rheumatoid arthritis with rheumatoid factor of multiple sites without organ or systems involvement: Secondary | ICD-10-CM | POA: Diagnosis not present

## 2024-08-13 DIAGNOSIS — Z111 Encounter for screening for respiratory tuberculosis: Secondary | ICD-10-CM | POA: Diagnosis not present

## 2024-08-17 DIAGNOSIS — S2231XA Fracture of one rib, right side, initial encounter for closed fracture: Secondary | ICD-10-CM | POA: Diagnosis not present

## 2024-08-18 ENCOUNTER — Encounter: Payer: Self-pay | Admitting: Radiology

## 2024-08-18 ENCOUNTER — Ambulatory Visit (INDEPENDENT_AMBULATORY_CARE_PROVIDER_SITE_OTHER): Admitting: Radiology

## 2024-08-18 VITALS — BP 120/70 | Wt 129.0 lb

## 2024-08-18 DIAGNOSIS — M8080XA Other osteoporosis with current pathological fracture, unspecified site, initial encounter for fracture: Secondary | ICD-10-CM

## 2024-08-18 NOTE — Progress Notes (Signed)
 Courtney Crane Dec 03, 1950 [id number]   History: Postmenopausal 73 y.o. presents to discuss DEXA results. Recent rib fracture since DEXA.   Narrative & Impression  EXAM: DUAL X-RAY ABSORPTIOMETRY (DXA) FOR BONE MINERAL DENSITY 07/23/2024 12:34 pm   CLINICAL DATA:  73 year old Female Postmenopausal. Osteopenia   History of fragility fracture. Patient is or has been on glucocorticoid therapy. Patient is or has been on bone building therapies.   TECHNIQUE: An axial (e.g., hips, spine) and/or appendicular (e.g., radius) exam was performed, as appropriate, using GE Psychologist, sport and exercise at [facility]. Images are obtained for bone mineral density measurement and are not obtained for diagnostic purposes. [id number]   Exclusions: Lumbar spine due to surgical repair.   COMPARISON:  None.   FINDINGS: Scan quality: Good.   LEFT FEMORAL NECK:   BMD (in g/cm2): 0.735   T-score: -2.2   Z-score: -0.3   LEFT TOTAL HIP:   BMD (in g/cm2): 0.689   T-score: -2.5   Z-score: -0.9   RIGHT FEMORAL NECK:   BMD (in g/cm2): 0.796   T-score: -1.7   Z-score: 0.1   RIGHT TOTAL HIP:   BMD (in g/cm2): 0.761   T-score: -2.0   Z-score: -0.3   LEFT FOREARM (RADIUS 33%):   BMD (in g/cm2): 0.543   T-score: -2.3   Z-score: -0.2   FRAX 10-YEAR PROBABILITY OF FRACTURE:   FRAX not reported as the lowest BMD is not in the osteopenia range.   IMPRESSION: Osteoporosis based on BMD.   Fracture risk is unknown due to history of bone building therapy.   RECOMMENDATIONS: 1. All patients should optimize calcium  and vitamin D  intake.   2. Consider FDA-approved medical therapies in postmenopausal women and men aged 48 years and older, based on the following:   - A hip or vertebral (clinical or morphometric) fracture   - T-score less than or equal to -2.5 and secondary causes have been excluded.   - Low bone mass (T-score between -1.0 and -2.5) and a  10-year probability of a hip fracture greater than or equal to 3% or a 10-year probability of a major osteoporosis-related fracture greater than or equal to 20% based on the [place] -adapted WHO algorithm.   - Clinician judgment and/or patient preferences may indicate treatment for people with 10-year fracture probabilities above or below these levels   3. Patients with diagnosis of osteoporosis or at high risk for fracture should have regular bone mineral density tests. For patients eligible for Medicare, routine testing is allowed once every 2 years. The testing frequency can be increased to one year for patients who have rapidly progressing disease, those who are receiving or discontinuing medical therapy to restore bone mass, or have additional risk factors.     Electronically Signed   By: Harrietta Sherry M.D.   On: 07/24/2024 15:40    Latest Reference Range & Units 06/11/24 10:20  GFR, Estimated >60 mL/min >60   The following portions of the patient's history were reviewed and updated as appropriate: allergies, current medications, past family history, past medical history, past social history, past surgical history, and problem list.  Review of Systems Pertinent items noted in HPI and remainder of comprehensive ROS otherwise negative.  Past medical history, past surgical history, family history and social history were all reviewed and documented in the EPIC chart.  Exam:  Vitals:   08/18/24 1102  BP: 120/70  Weight: 129 lb (58.5 kg)   Body mass index is 22.67 kg/m.  Physical Exam Constitutional:      Appearance: Normal appearance. She is normal weight.  Pulmonary:     Effort: Pulmonary effort is normal.  Neurological:     Mental Status: She is alert.  Psychiatric:        Mood and Affect: Mood normal.        Thought Content: Thought content normal.        Judgment: Judgment normal.      Assessment/Plan:   1. Localized osteoporosis with current pathological  fracture, initial encounter (Primary) Discussed medication options, would like to try Evenity to build bone.  Current rib fracture. GFR normal 05/2024    GINETTE COZIER B WHNP-BC, 11:21 AM 08/18/2024

## 2024-08-19 DIAGNOSIS — J4550 Severe persistent asthma, uncomplicated: Secondary | ICD-10-CM | POA: Diagnosis not present

## 2024-08-24 ENCOUNTER — Other Ambulatory Visit: Payer: Self-pay | Admitting: Gastroenterology

## 2024-08-25 DIAGNOSIS — M069 Rheumatoid arthritis, unspecified: Secondary | ICD-10-CM | POA: Diagnosis not present

## 2024-08-25 DIAGNOSIS — F418 Other specified anxiety disorders: Secondary | ICD-10-CM | POA: Diagnosis not present

## 2024-08-25 DIAGNOSIS — F330 Major depressive disorder, recurrent, mild: Secondary | ICD-10-CM | POA: Diagnosis not present

## 2024-08-25 DIAGNOSIS — D84821 Immunodeficiency due to drugs: Secondary | ICD-10-CM | POA: Diagnosis not present

## 2024-08-26 ENCOUNTER — Other Ambulatory Visit: Payer: Self-pay

## 2024-08-26 ENCOUNTER — Other Ambulatory Visit: Payer: Self-pay | Admitting: *Deleted

## 2024-08-26 ENCOUNTER — Other Ambulatory Visit: Payer: Self-pay | Admitting: Acute Care

## 2024-08-26 ENCOUNTER — Encounter (INDEPENDENT_AMBULATORY_CARE_PROVIDER_SITE_OTHER): Payer: Self-pay

## 2024-08-26 DIAGNOSIS — M8080XA Other osteoporosis with current pathological fracture, unspecified site, initial encounter for fracture: Secondary | ICD-10-CM

## 2024-08-26 DIAGNOSIS — J4551 Severe persistent asthma with (acute) exacerbation: Secondary | ICD-10-CM

## 2024-08-26 DIAGNOSIS — M810 Age-related osteoporosis without current pathological fracture: Secondary | ICD-10-CM

## 2024-08-26 MED ORDER — DUPIXENT 300 MG/2ML ~~LOC~~ SOAJ
300.0000 mg | SUBCUTANEOUS | 3 refills | Status: DC
  Filled 2024-08-26 – 2024-08-27 (×2): qty 4, 28d supply, fill #0
  Filled 2024-09-24: qty 4, 28d supply, fill #1
  Filled 2024-10-22: qty 4, 28d supply, fill #2
  Filled 2024-11-17 – 2024-11-18 (×3): qty 4, 28d supply, fill #3

## 2024-08-26 MED ORDER — ROMOSOZUMAB-AQQG 105 MG/1.17ML ~~LOC~~ SOSY
210.0000 mg | PREFILLED_SYRINGE | Freq: Once | SUBCUTANEOUS | Status: AC
  Administered 2024-09-22: 210 mg via SUBCUTANEOUS

## 2024-08-26 NOTE — Telephone Encounter (Signed)
 Refill sent for DUPIXENT  to [facility]: [phone]   Dose: 300mg  [place] every 14 days   Last OV: 07/02/24 Provider: Dr. Meade  Next OV: 09/01/24  Routing to scheduling team for follow-up on appt scheduling  Aleck Puls, PharmD, BCPS Clinical Pharmacist  [facility]

## 2024-08-26 NOTE — Telephone Encounter (Signed)
 Pt requesting refill of specialty medication - routing to Rx team to advise.

## 2024-08-27 ENCOUNTER — Other Ambulatory Visit: Payer: Self-pay

## 2024-08-27 ENCOUNTER — Other Ambulatory Visit (HOSPITAL_COMMUNITY): Payer: Self-pay

## 2024-08-27 ENCOUNTER — Ambulatory Visit: Admitting: Internal Medicine

## 2024-08-27 NOTE — Progress Notes (Signed)
 Specialty Pharmacy Refill Coordination Note  MyChart Questionnaire Submission  Courtney Crane is a 73 y.o. female contacted today regarding refills of specialty medication(s) Dupixent .  Doses on hand: (Patient-Rptd) 0   Injection date: (Patient-Rptd) 09/09/24  Patient requested: (Patient-Rptd) Delivery   Delivery date: 09/01/24  Verified address: [address] [name] [phone]  Medication will be filled on 08/31/24.

## 2024-08-28 ENCOUNTER — Other Ambulatory Visit: Payer: Self-pay

## 2024-08-28 NOTE — Progress Notes (Signed)
 Clinical Intervention Note  Clinical Intervention Notes: Patient reported starting oxycodone  as needed for cracked rib. No DDIs identified with Dupixent .   Clinical Intervention Outcomes: Prevention of an adverse drug event   Advertising account planner

## 2024-08-29 ENCOUNTER — Other Ambulatory Visit: Payer: Self-pay | Admitting: Obstetrics and Gynecology

## 2024-08-29 DIAGNOSIS — N3281 Overactive bladder: Secondary | ICD-10-CM

## 2024-09-01 ENCOUNTER — Encounter: Payer: Self-pay | Admitting: Internal Medicine

## 2024-09-01 ENCOUNTER — Ambulatory Visit: Admitting: Internal Medicine

## 2024-09-01 VITALS — BP 110/60 | HR 68 | Temp 98.2°F | Ht 62.0 in | Wt 131.0 lb

## 2024-09-01 DIAGNOSIS — M06039 Rheumatoid arthritis without rheumatoid factor, unspecified wrist: Secondary | ICD-10-CM | POA: Diagnosis not present

## 2024-09-01 DIAGNOSIS — J4550 Severe persistent asthma, uncomplicated: Secondary | ICD-10-CM

## 2024-09-01 DIAGNOSIS — J301 Allergic rhinitis due to pollen: Secondary | ICD-10-CM

## 2024-09-01 NOTE — Progress Notes (Signed)
 Lesli Issa    [id number]    Jun 12, 1951  Primary Care Physician:[name], MD Date of Appointment: 09/01/2024 Established Patient Visit  Chief complaint:   Chief Complaint  Patient presents with   Asthma    Completely controlled      HPI: Courtney Crane is a 73 y.o. woman with rheumatoid arthritis on Remicade , Severe persistent asthma on dupixent  since August 2023. Lifelong asthma since childhood.   Rheumatologist - Dr Ishmael for rheumatoid arthritis Allergy - Dr Jeneal  Interval Updates: Here for asthma follow up. Doing well.   RA affects her hands and feet. Currently well controlled.  Saw Dr. Isaiah - was treated with prednisone  in August 2025 after a trip. She was also prescribed a nebulizer machine with treatments but felt that the albuterol  made her more jittery with the steroids.   Unlcear what her trigger was this summer. She feels her asthma was ok and it was likely a hypertensive episode coupled with anxiety.  Current Regimen: Breztri  2 puffs twice daily, dupixent  Asthma Triggers: seasonal allergies, exertion, dust, anxiety Exacerbations in the last year: once in the last 12 months.  History of hospitalization or intubation: never.  Allergy Testing/rhinitis: atrovent  and flonase since February 2025. Zyrtec  at night  GERD: ACT:  Asthma Control Test ACT Total Score  09/01/2024 12:51 PM 20  02/25/2024  1:27 PM 23  01/04/2023  3:00 PM 24   FeNO: Serum Eos/IgE:   I have reviewed the patient's family social and past medical history and updated as appropriate.   Past Medical History:  Diagnosis Date   AKI (acute kidney injury) 11/29/2017   Allergy    Anemia    Anxiety    Anxiety disorder 12/06/2014   Arthritis    Asthma    Bloating 09/19/2023   Bursitis of left hip 09/17/2019   Carpal tunnel syndrome of left wrist 04/24/2017   Cataract    Cellulitis and abscess of buttock 11/28/2017   Chest pain on exertion 06/09/2024    Cholelithiasis with chronic cholecystitis 01/30/2017   Chronic diarrhea 09/19/2023   Claudication 04/09/2019   Colon polyps    Complication of anesthesia    sometimes I dont like to wake up after  ie difficult to awaken    Disorder of bone 09/23/2019   Diverticulitis    Diverticulosis    DOE (dyspnea on exertion) 04/09/2019   Dysphagia 01/19/2020   Encounter for general adult medical examination without abnormal findings 01/30/2018   Essential hypertension    Uncontrolled, Norvasc  10 MG daily     Exertional chest pain 04/09/2019   Family history of ischemic heart disease (IHD) 09/14/2015   Fatty liver    Flatulence 09/19/2023   Genital herpes simplex 06/06/2016   GERD (gastroesophageal reflux disease)    Hardening of the aorta (main artery of the heart) 06/08/2024   Hardening of the aorta (main artery of the heart)    History of right shoulder replacement 12/06/2022   History of shingles 06/06/2016   HTN (hypertension)    Hyperlipidemia 12/06/2014   IBS (irritable bowel syndrome)    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   Insomnia, unspecified    On Melatonin, controlled     Iron deficiency anemia 12/06/2014   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   Leukocytosis 12/04/2017   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   MRSA infection    Muscle weakness 05/11/2020   Myopathy 03/31/2019   Osteoarthritis    Osteopenia  Overactive bladder 06/08/2024   Pain 12/05/2019   Pain involving joint of finger of left hand 04/26/2017   Pain of left lower extremity 08/22/2020   PONV (postoperative nausea and vomiting)    Poorly controlled persistent asthma 05/17/2022   Prediabetes    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   Rheumatoid arthritis (HCC)    Right shoulder pain 12/06/2022   S/P lumbar fusion 01/23/2021   Seasonal allergies    Seborrheic dermatitis    Shingles    Spinal stenosis of lumbar region 06/08/2024   Status post dilation of esophageal narrowing     Strain of muscle, fascia and tendon of the posterior muscle group at thigh level, right thigh, subsequent encounter 08/15/2020   Thyroid  nodule 06/08/2024   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   Type 2 diabetes mellitus with other specified complication (HCC) 06/08/2024   Uncomplicated asthma 12/06/2014   Vitamin D  deficiency     Past Surgical History:  Procedure Laterality Date   ADENOIDECTOMY     APPENDECTOMY  1975   bladder tack  1990   CARDIAC CATHETERIZATION  2014   [place]   CATARACT EXTRACTION, BILATERAL Bilateral    CHOLECYSTECTOMY N/A 01/31/2017   Procedure: LAPAROSCOPIC CHOLECYSTECTOMY WITH INTRAOPERATIVE CHOLANGIOGRAM;  Surgeon: Krystal Spinner, MD;  Location: [facility];  Service: General;  Laterality: N/A;   EYE SURGERY  September 2020   INCISION AND DRAINAGE PERIRECTAL ABSCESS N/A 12/02/2017   Procedure: IRRIGATION AND DEBRIDEMENT PERINEAL ABSCESS;  Surgeon: Tanda Locus, MD;  Location: [facility];  Service: General;  Laterality: N/A;   JOINT REPLACEMENT  2011, 11-2022   LEFT HEART CATH AND CORONARY ANGIOGRAPHY N/A 06/11/2024   Procedure: LEFT HEART CATH AND CORONARY ANGIOGRAPHY;  Surgeon: Elmira Newman PARAS, MD;  Location: [facility];  Service: Cardiovascular;  Laterality: N/A;   LUMBAR FUSION     MENISCUS REPAIR Right 2013   REVERSE SHOULDER ARTHROPLASTY Right 12/06/2022   Procedure: RIGHT REVERSE SHOULDER ARTHROPLASTY;  Surgeon: Dozier Soulier, MD;  Location: [facility];  Service: Orthopedics;  Laterality: Right;   ROTATOR CUFF REPAIR Right 2005   SPINE SURGERY  March 2022   TONSILLECTOMY     as a child   TOTAL ABDOMINAL HYSTERECTOMY  1988   TOTAL KNEE ARTHROPLASTY Left    WRIST RECONSTRUCTION Right 2008    Family History  Problem Relation Age of Onset   Colon polyps Mother    Diabetes Mother    Heart disease Mother    Kidney cancer Mother    Arthritis Mother    Hypertension Mother    Hyperlipidemia Mother    Cancer Mother    Hearing loss Mother     Kidney disease Mother    Heart attack Mother        stints and bypass   Dementia Father    CAD Brother    Esophageal cancer Brother    Colon polyps Son    Colon cancer Neg Hx    Stomach cancer Neg Hx    Rectal cancer Neg Hx    Prostate cancer Neg Hx    Pancreatic cancer Neg Hx     Social History   Occupational History   Occupation: retired  Tobacco Use   Smoking status: Never    Passive exposure: Never   Smokeless tobacco: Never  Vaping Use   Vaping status: Never Used  Substance and Sexual Activity   Alcohol  use: Yes    Alcohol /week: 7.0 standard drinks of alcohol     Types:  7 Standard drinks or equivalent per week    Comment: wine daily   Drug use: No    Comment: takes CBD oil   Sexual activity: Not Currently    Partners: Male    Birth control/protection: Post-menopausal    Comment: Hx HSV, High RISK Medicare, no to all other Medicare screening questions, less than 5 lifetime partners     Physical Exam: Blood pressure (!) 156/60, pulse 68, temperature 98.2 F (36.8 C), temperature source Oral, height 5' 2 (1.575 m), weight 131 lb (59.4 kg), SpO2 95%.  Gen:      No distress, well appearing ENT:  lmmm Lungs:    ctab no wheeze CV:         RRR no mrg   Data Reviewed: Imaging: I have personally reviewed the chest xray Jan 2024 - no acute process  PFTs:     Latest Ref Rng & Units 01/24/2022    3:54 PM  PFT Results  FVC-Pre L 2.64   FVC-Predicted Pre % 91   FVC-Post L 2.74   FVC-Predicted Post % 95   Pre FEV1/FVC % % 72   Post FEV1/FCV % % 77   FEV1-Pre L 1.90   FEV1-Predicted Pre % 87   FEV1-Post L 2.10   DLCO uncorrected ml/min/mmHg 18.05   DLCO UNC% % 95   DLCO corrected ml/min/mmHg 18.34   DLCO COR %Predicted % 97   DLVA Predicted % 99   TLC L 4.75   TLC % Predicted % 96   RV % Predicted % 104    I have personally reviewed the patient's PFTs and normal pulmonary function  Labs: Lab Results  Component Value Date   WBC 6.8 08/04/2024    HGB 13.4 08/04/2024   HCT 39.7 08/04/2024   MCV 96.6 08/04/2024   PLT 152 08/04/2024   Lab Results  Component Value Date   NA 139 06/11/2024   K 4.4 06/11/2024   CO2 26 06/11/2024   GLUCOSE 108 (H) 06/11/2024   BUN 14 06/11/2024   CREATININE 0.85 06/11/2024   CALCIUM  9.5 06/11/2024   GFR 64.70 11/27/2021   GFRNONAA >60 06/11/2024    Immunization status: Immunization History  Administered Date(s) Administered   Fluad Quad(high Dose 65+) 09/03/2022   Fluzone Influenza virus vaccine,trivalent (IIV3), split virus 11/19/2013   Influenza, Quadrivalent, Recombinant, Inj, Pf 08/16/2018, 09/03/2019, 08/27/2020   Influenza,inj,quad, With Preservative 07/20/2018, 08/20/2019   Influenza-Unspecified 10/31/2015, 10/30/2017, 10/02/2023   PFIZER(Purple Top)SARS-COV-2 Vaccination 07/14/2020, 02/17/2021   Pneumococcal Polysaccharide-23 10/30/2017   Tdap 11/20/2011   Zoster, Live 11/19/2012    External Records Personally Reviewed: pulmonary  Assessment:  Severe persistent asthma, controlled Rheumatoid arthritis, multiple sites, on remicade  Seasonal allergic rhinitis Chronic post nasal drainage Possible left ear effusion  Plan/Recommendations:  Glad your asthma is doing well.   Continue breztri  2 puffs twice daily, gargle after use.   Continue albuterol  inhaler as needed.  Continue dupixent  injections.   Continue your nasal sprays and allegra  for the seasonal allergies.   Return to Care: Return in about 6 months (around 03/02/2025) for Dr. Kassie.   Verdon Gore, MD Pulmonary and Critical Care Medicine [facility] Office:[phone]

## 2024-09-01 NOTE — Patient Instructions (Addendum)
 It was a pleasure to see you today!  Please schedule follow up with Dr. Kassie in 6 months.  If my schedule is not open yet, we will contact you with a reminder closer to that time. Please call [phone] if you haven't heard from us  a month before, and always call us  sooner if issues or concerns arise. You can also send us  a message through MyChart, but but aware that this is not to be used for urgent issues and it may take up to 5-7 days to receive a reply. Please be aware that you will likely be able to view your results before I have a chance to respond to them. Please give us  5 business days to respond to any non-urgent results.    Glad your asthma is doing well.   Continue breztri  2 puffs twice daily, gargle after use.   Continue albuterol  inhaler as needed.  Continue dupixent  injections.   Continue your nasal sprays and allegra  for the seasonal allergies.

## 2024-09-02 NOTE — Telephone Encounter (Signed)
 Routing to [place] to f/u with patient.

## 2024-09-08 ENCOUNTER — Other Ambulatory Visit: Payer: Self-pay

## 2024-09-08 NOTE — Progress Notes (Signed)
 Specialty Pharmacy Ongoing Clinical Assessment Note  Courtney Crane is a 73 y.o. female who is being followed by the specialty pharmacy service for RxSp Asthma/COPD   Patient's specialty medication(s) reviewed today: Dupilumab  (Dupixent )   Missed doses in the last 4 weeks: 0   Patient/Caregiver did not have any additional questions or concerns.   Therapeutic benefit summary: Patient is achieving benefit   Adverse events/side effects summary: No adverse events/side effects   Patient's therapy is appropriate to: Continue    Goals Addressed             This Visit's Progress    Maintain optimal adherence to therapy   On track    Patient is on track. Patient will maintain adherence, adhere to provider and/or lab appointments, and be monitored by provider to determine if a change in treatment plan is warranted         Follow up: 12 months  [facility] Specialty Pharmacist

## 2024-09-11 ENCOUNTER — Ambulatory Visit: Admitting: Obstetrics and Gynecology

## 2024-09-11 ENCOUNTER — Encounter: Payer: Self-pay | Admitting: Obstetrics and Gynecology

## 2024-09-11 ENCOUNTER — Other Ambulatory Visit: Payer: Self-pay

## 2024-09-11 VITALS — BP 109/68 | HR 73

## 2024-09-11 DIAGNOSIS — N3281 Overactive bladder: Secondary | ICD-10-CM

## 2024-09-11 DIAGNOSIS — N393 Stress incontinence (female) (male): Secondary | ICD-10-CM

## 2024-09-11 DIAGNOSIS — N952 Postmenopausal atrophic vaginitis: Secondary | ICD-10-CM

## 2024-09-11 DIAGNOSIS — N812 Incomplete uterovaginal prolapse: Secondary | ICD-10-CM

## 2024-09-11 MED ORDER — VIBEGRON 75 MG PO TABS: 1.0000 | ORAL_TABLET | Freq: Every day | ORAL | 11 refills | Status: AC

## 2024-09-11 MED ORDER — ESTRADIOL 0.01 % VA CREA: TOPICAL_CREAM | VAGINAL | 11 refills | Status: AC

## 2024-09-11 MED ORDER — MIRABEGRON ER 25 MG PO TB24
25.0000 mg | ORAL_TABLET | Freq: Every day | ORAL | 3 refills | Status: DC
Start: 2024-09-11 — End: 2024-09-11

## 2024-09-11 NOTE — Progress Notes (Signed)
 [facility]   Subjective:     Chief Complaint:  Chief Complaint  Patient presents with   Follow-up    Jerrell Hart is a 73 y.o. female I here for one year med follow up.   History of Present Illness: Ivet Guerrieri is a 73 y.o. female with stage II posterior POP, stress incontinence and OAB who presents for a pessary check. She is using a size #0 incontinence dish pessary. The pessary is working very well. She cleans it about every week. Denies vaginal bleeding. Not using vaginal estrogen.   Myrbetriq  has been working very well. She recently has been having difficulty controlling her blood pressure and was started on carvedilol  by her cardiologist last month. She had a cardiac cath and echo July 2025.     Past Medical History: Patient  has a past medical history of AKI (acute kidney injury) (11/29/2017), Allergy, Anemia, Anxiety, Anxiety disorder (12/06/2014), Arthritis, Asthma, Bloating (09/19/2023), Bursitis of left hip (09/17/2019), Carpal tunnel syndrome of left wrist (04/24/2017), Cataract, Cellulitis and abscess of buttock (11/28/2017), Chest pain on exertion (06/09/2024), Cholelithiasis with chronic cholecystitis (01/30/2017), Chronic diarrhea (09/19/2023), Claudication (04/09/2019), Colon polyps, Complication of anesthesia, Disorder of bone (09/23/2019), Diverticulitis, Diverticulosis, DOE (dyspnea on exertion) (04/09/2019), Dysphagia (01/19/2020), Encounter for general adult medical examination without abnormal findings (01/30/2018), Essential hypertension, Exertional chest pain (04/09/2019), Family history of ischemic heart disease (IHD) (09/14/2015), Fatty liver, Flatulence (09/19/2023), Genital herpes simplex (06/06/2016), GERD (gastroesophageal reflux disease), Hardening of the aorta (main artery of the heart) (06/08/2024), Hardening of the aorta (main artery of the heart), 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), History of shingles (06/06/2016), HTN  (hypertension), Hyperlipidemia (12/06/2014), IBS (irritable bowel syndrome), 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), Insomnia, unspecified, Iron deficiency anemia (12/06/2014), Irritable 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/01/2023), Leukocytosis (12/04/2017), 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), MRSA infection, Muscle weakness (05/11/2020), 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), Osteoarthritis, Osteopenia, Overactive bladder (06/08/2024), Pain (12/05/2019), Pain involving joint of finger of left hand (04/26/2017), Pain of left lower extremity (08/22/2020), PONV (postoperative nausea and vomiting), Poorly controlled persistent asthma (05/17/2022), Prediabetes, 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   Past Surgical History: She  has a past surgical history that includes Appendectomy (1975); Total abdominal hysterectomy (1988); Tonsillectomy; bladder tack (1990); Rotator cuff repair (Right, 2005); Wrist reconstruction (Right, 2008); Meniscus repair (Right, 2013); Total knee arthroplasty (Left); Cholecystectomy (N/A, 01/31/2017); Incision and drainage perirectal abscess (N/A, 12/02/2017); Cataract extraction, bilateral (Bilateral); Adenoidectomy; Lumbar fusion; Reverse shoulder arthroplasty (Right, 12/06/2022); Eye surgery (September 2020); Joint replacement (2011, 11-2022); Spine surgery (March 2022); LEFT HEART CATH AND CORONARY ANGIOGRAPHY (N/A,  06/11/2024); and Cardiac catheterization (2014).   Medications: She has a current medication list which includes the following prescription(s): acetaminophen , albuterol  (2.5 MG/3ML) 0.083% NEBU 3 mL, albuterol  (5 MG/ML) 0.5% NEBU 0.5 mL, airsupra , amlodipine , vitamin c, aspirin , biotin, bismuth subsalicylate, breztri  aerosphere, calcium  carb-cholecalciferol , tums ultra 1000, cannabidiol, carvedilol , cetirizine , vitamin d -3, dupixent , escitalopram, famotidine , ferrous sulfate, fexofenadine , fluocinonide  cream, furosemide , hydrochlorothiazide , infliximab , ipratropium, ipratropium-albuterol , leflunomide, losartan , magnesium gluconate, melatonin, multiple vitamins-minerals, nitroglycerin , fish oil, omeprazole , polyethylene glycol, psyllium, rosuvastatin , simethicone, sodium chloride , temazepam, tretinoin,  valacyclovir , and mirabegron  er, and the following Facility-Administered Medications: romosozumab-aqqg.   Allergies: Patient is allergic to meperidine, methotrexate, nickel, and tape.   Social History: Patient  reports that she has never smoked. She has never been exposed to tobacco smoke. She has never used smokeless tobacco. She reports current alcohol  use of about 7.0 standard drinks of alcohol  per week. She reports that she does not use drugs.      Objective:    Physical Exam: BP 109/68   Pulse 73  Gen: No apparent distress, A&O x 3. Detailed Urogynecologic Evaluation:  Pelvic Exam: Normal external female genitalia; Bartholin's and Skene's glands normal in appearance; urethral meatus normal in appearance, no urethral masses or discharge. The pessary was noted to be in place. It was removed and cleaned. Speculum exam revealed small non-bleeding abrasion at the apex,  atrophy noted. The pessary was replaced. It was comfortable to the patient and fit well.   Prior POP-Q:    POP-Q   -3                                            Aa   -3                                           Ba   -5.5                                               C    3.5                                            Gh   5                                            Pb   7                                            tvl    0                                            Ap   0                                            Bp                                                  D  Assessment/Plan:    Assessment: Ms. Keegan is a 73 y.o. with stage II pelvic organ prolapse, stress incontinence, and OAB here for a pessary check. She is doing well.  Plan: - Continue #0 incontinence dish pessary. Remove weekly or as needed.  - Start vaginal estrogen cream, ideally twice a week, but place on pessary with insertion once a week with pessary cleaning.  - We discussed possible interaction with Myrbetriq  and beta blocker medications. We will change to Gemtesa 75mg  daily which is metabolized differently. 2 weeks samples provided. May need to get authorization for medication.   Follow up 2 months  Rosaline LOISE Caper, MD

## 2024-09-16 DIAGNOSIS — D84821 Immunodeficiency due to drugs: Secondary | ICD-10-CM | POA: Diagnosis not present

## 2024-09-16 DIAGNOSIS — F330 Major depressive disorder, recurrent, mild: Secondary | ICD-10-CM | POA: Diagnosis not present

## 2024-09-16 DIAGNOSIS — R110 Nausea: Secondary | ICD-10-CM | POA: Diagnosis not present

## 2024-09-16 DIAGNOSIS — M069 Rheumatoid arthritis, unspecified: Secondary | ICD-10-CM | POA: Diagnosis not present

## 2024-09-16 DIAGNOSIS — R630 Anorexia: Secondary | ICD-10-CM | POA: Diagnosis not present

## 2024-09-16 DIAGNOSIS — F418 Other specified anxiety disorders: Secondary | ICD-10-CM | POA: Diagnosis not present

## 2024-09-17 DIAGNOSIS — M0579 Rheumatoid arthritis with rheumatoid factor of multiple sites without organ or systems involvement: Secondary | ICD-10-CM | POA: Diagnosis not present

## 2024-09-22 ENCOUNTER — Ambulatory Visit (INDEPENDENT_AMBULATORY_CARE_PROVIDER_SITE_OTHER): Admitting: *Deleted

## 2024-09-22 DIAGNOSIS — M810 Age-related osteoporosis without current pathological fracture: Secondary | ICD-10-CM | POA: Diagnosis not present

## 2024-09-24 ENCOUNTER — Ambulatory Visit: Admitting: Allergy

## 2024-09-24 ENCOUNTER — Other Ambulatory Visit: Payer: Self-pay

## 2024-09-24 VITALS — BP 120/84 | HR 75 | Temp 97.3°F | Wt 124.9 lb

## 2024-09-24 DIAGNOSIS — H6993 Unspecified Eustachian tube disorder, bilateral: Secondary | ICD-10-CM

## 2024-09-24 DIAGNOSIS — J3089 Other allergic rhinitis: Secondary | ICD-10-CM | POA: Diagnosis not present

## 2024-09-24 DIAGNOSIS — J4530 Mild persistent asthma, uncomplicated: Secondary | ICD-10-CM | POA: Diagnosis not present

## 2024-09-24 DIAGNOSIS — H1013 Acute atopic conjunctivitis, bilateral: Secondary | ICD-10-CM

## 2024-09-24 MED ORDER — IPRATROPIUM BROMIDE 0.06 % NA SOLN: 1.0000 | Freq: Every day | NASAL | 5 refills | Status: AC

## 2024-09-24 MED ORDER — BREZTRI AEROSPHERE 160-9-4.8 MCG/ACT IN AERO: 2.0000 | INHALATION_SPRAY | Freq: Two times a day (BID) | RESPIRATORY_TRACT | 5 refills | Status: AC

## 2024-09-24 MED ORDER — AIRSUPRA 90-80 MCG/ACT IN AERO
2.0000 | INHALATION_SPRAY | RESPIRATORY_TRACT | 2 refills | Status: AC | PRN
Start: 2024-09-24 — End: ?

## 2024-09-24 NOTE — Progress Notes (Signed)
 Specialty Pharmacy Refill Coordination Note  Courtney Crane is a 73 y.o. female contacted today regarding refills of specialty medication(s) Dupilumab  (Dupixent )   Patient requested Delivery   Delivery date: 10/02/24   Verified address: [address]  [name] [address]   Medication will be filled on: 10/01/24

## 2024-09-24 NOTE — Progress Notes (Unsigned)
 Follow-up Note  RE: Courtney Crane MRN: [id number] DOB: [birth year] Date of Office Visit: 09/24/2024   History of present illness: Courtney Crane is a 73 y.o. female presenting today for follow-up of asthma, allergic rhinoconjunctivitis and eustachian tube dysfunction.  She was last seen in the office on 03/20/2024 by myself. Discussed the use of AI scribe software for clinical note transcription with the patient, who gave verbal consent to proceed.  She has not experienced any respiratory flares and uses her rescue inhaler, Airsupra , approximately twice a month, primarily due to outdoor weather changes. She uses a nebulizer very rarely, only when she anticipates a bad day upon waking. Her insurance covers Airsupra , and she continues to use Breztri  at two puffs twice daily and Dupixent  injections every two weeks without issues.  She rotates her injection sites.  Regarding allergies, she takes Allegra  daily and occasionally supplements with Zyrtec  when visiting her children who have pets, particularly in a household with cats. This combination is effective. She continues to use a nasal spray Atrovent  and finds it effective, but rarely uses eye drops.  She experiences persistent ear fullness, which remains unchanged. She tried Xhance  nasal spray without improvement and has also used saline rinses like Navage, but these have not alleviated her symptoms. She has consulted with an ENT multiple times, but no effective treatment has been identified.      Review of systems: 10pt ROS negative unless noted above in HPI  Past medical/social/surgical/family history have been reviewed and are unchanged unless specifically indicated below.  No changes  Medication List: Current Outpatient Medications  Medication Sig Dispense Refill   acetaminophen  (TYLENOL ) 500 MG tablet Take 1,000 mg by mouth every 6 (six) hours as needed for mild pain (pain score 1-3) or moderate pain (pain score 4-6).      albuterol  (2.5 MG/3ML) 0.083% NEBU 3 mL, albuterol  (5 MG/ML) 0.5% NEBU 0.5 mL Inhale 5 mg into the lungs every 6 (six) hours as needed (asthma).     Albuterol -Budesonide (AIRSUPRA ) 90-80 MCG/ACT AERO Inhale 2 puffs into the lungs every 4 (four) hours as needed. 10.7 g 2   amLODipine  (NORVASC ) 10 MG tablet Take 10 mg by mouth in the morning.     Ascorbic Acid  (VITAMIN C) 1000 MG tablet Take 1,000 mg by mouth daily.     ASPIRIN  81 PO      Biotin 10 MG CHEW      bismuth subsalicylate (PEPTO BISMOL) 262 MG chewable tablet Chew 262-524 mg by mouth 3 (three) times daily as needed for diarrhea or loose stools or indigestion.     budeson-glycopyrrolate-formoterol (BREZTRI  AEROSPHERE) 160-9-4.8 MCG/ACT AERO inhaler Inhale 2 puffs into the lungs in the morning and at bedtime. 10.7 g 5   Calcium  Carb-Cholecalciferol  (CALCIUM  600+D3 PO) Take 1 tablet by mouth in the morning.     calcium  elemental as carbonate (TUMS ULTRA 1000) 400 MG chewable tablet Chew 1,000 mg by mouth 3 (three) times daily as needed for heartburn.     CANNABIDIOL PO Take 1 capsule by mouth at bedtime. CBD     carvedilol  (COREG ) 3.125 MG tablet Take 1 tablet (3.125 mg total) by mouth 2 (two) times daily. 180 tablet 3   cetirizine  (ZYRTEC ) 10 MG tablet Take 10 mg by mouth daily as needed for allergies.     Cholecalciferol  (VITAMIN D -3) 125 MCG (5000 UT) TABS Take 5,000 Units by mouth in the morning.     Dupilumab  (DUPIXENT ) 300 MG/2ML SOAJ Inject 300 mg into  the skin every 14 (fourteen) days. 4 mL 3   escitalopram (LEXAPRO) 5 MG tablet Take 5 mg by mouth daily.     estradiol (ESTRACE) 0.01 % CREA vaginal cream Place 0.5g at night twice a week 42.5 g 11   famotidine  (PEPCID ) 40 MG tablet TAKE 1 TABLET BY MOUTH EVERYDAY AT BEDTIME 90 tablet 3   Ferrous Sulfate ([employer] SLOW RELEASE IRON PO) Take 45 mg by mouth every other day.     fexofenadine  (ALLEGRA ) 180 MG tablet Take 0.5 tablets (90 mg total) by mouth in the morning. 30 tablet 5    fluocinonide  cream (LIDEX ) 0.05 % SMARTSIG:1 sparingly Topical Twice Daily     furosemide  (LASIX ) 20 MG tablet Take 20 mg (1 tablet) daily for 3 days and then decrease to 20 mg (1 tablet) every other day. 90 tablet 3   hydrochlorothiazide  (HYDRODIURIL ) 12.5 MG tablet Take 12.5 mg by mouth every morning.     inFLIXimab  (REMICADE ) 100 MG injection Inject into the vein every 5 (five) weeks.     ipratropium (ATROVENT ) 0.06 % nasal spray Place 1 spray into both nostrils daily. 15 mL 5   ipratropium-albuterol  (DUONEB) 0.5-2.5 (3) MG/3ML SOLN Take 3 mLs by nebulization every 4 (four) hours as needed. 360 mL 10   leflunomide (ARAVA) 20 MG tablet Take 20 mg by mouth every evening.     losartan  (COZAAR ) 100 MG tablet Take 100 mg by mouth daily.     magnesium gluconate (MAGONATE) 500 (27 Mg) MG TABS tablet Take 500 mg by mouth daily.     MELATONIN PO Take 1.5 mg by mouth at bedtime as needed (sleep).     Multiple Vitamins-Minerals (IMMUNE SUPPORT VITAMIN C PO) Take 1 tablet by mouth in the morning. With Vitamin D  3/Vitamin A/Magnesium     nitroGLYCERIN  (NITROSTAT ) 0.4 MG SL tablet Place 1 tablet (0.4 mg total) under the tongue every 5 (five) minutes as needed. 25 tablet 6   Omega-3 Fatty Acids (FISH OIL) 1200 MG CAPS Take 1,200 mg by mouth 2 (two) times daily.     omeprazole  (PRILOSEC) 40 MG capsule TAKE 1 CAPSULE (40 MG TOTAL) BY MOUTH 2 (TWO) TIMES DAILY BEFORE A MEAL. 180 capsule 0   polyethylene glycol (MIRALAX  / GLYCOLAX ) 17 g packet Take 17 g by mouth daily as needed for moderate constipation. In the morning     Psyllium (METAMUCIL 4 IN 1 FIBER PO) Take 2 capsules by mouth in the morning. Metamucil 3-in-1 Psyllium Fiber Supplement Capsules     rosuvastatin  (CRESTOR ) 10 MG tablet Take 10 mg by mouth every evening.     simethicone (MYLICON) 125 MG chewable tablet Chew 125 mg by mouth every 6 (six) hours as needed for flatulence.     sodium chloride  (OCEAN) 0.65 % SOLN nasal spray Place 1 spray into both  nostrils in the morning and at bedtime.     temazepam (RESTORIL) 15 MG capsule Take 15 mg by mouth at bedtime.     tretinoin (RETIN-A) 0.025 % cream Apply 1 Application topically at bedtime.     valACYclovir  (VALTREX ) 500 MG tablet Take 500 mg by mouth daily as needed (outbreaks).     Vibegron 75 MG TABS Take 1 tablet (75 mg total) by mouth daily. 30 tablet 11   No current facility-administered medications for this visit.     Known medication allergies: Allergies  Allergen Reactions   Meperidine Nausea Only   Methotrexate Other (See Comments)    Unknown reaction  Nickel Rash   Tape Rash and Other (See Comments)     Physical examination: Blood pressure 120/84, pulse 75, temperature (!) 97.3 F (36.3 C), temperature source Temporal, weight 124 lb 14.4 oz (56.7 kg), SpO2 96%.  General: Alert, interactive, in no acute distress. HEENT: PERRLA, TMs pearly gray, turbinates non-edematous without discharge, post-pharynx non erythematous. Neck: Supple without lymphadenopathy. Lungs: Clear to auscultation without wheezing, rhonchi or rales. {no increased work of breathing. CV: Normal S1, S2 without murmurs. Abdomen: Nondistended, nontender. Skin: Warm and dry, without lesions or rashes. Extremities:  No clubbing, cyanosis or edema. Neuro:   Grossly intact.  Diagnostics/Labs: None today  Assessment and plan: Asthma - Continue Dupixent  injections every 2 weeks.  Rotate your injection areas (can use arms as well if needed if not self-administering) - Continue Breztri  2 puffs twice a day.   - have access to AIRSUPRA  OR albuterol  inhaler 2 puffs every 4-6 hours as needed for cough/wheeze/shortness of breath/chest tightness.  May use 15-20 minutes prior to activity.   Monitor frequency of use.    Breathing control goals:  Full participation in all desired activities (may need albuterol  before activity) Albuterol  use two time or less a week on average (not counting use with  activity) Cough interfering with sleep two time or less a month Oral steroids no more than once a year No hospitalizations  Allergic rhinitis with conjunctivitis - Continue avoidance measures for dust mites, cat, dog, tree pollen, grass pollen, weed pollens.   - continue Zyrtec  and/or Allegra  dose 1-2 times a day is ok to use depending on symptom severity - continue Atrovent  nasal spray 1 spray daily.  For nasal drainage/runny nose can take up to 3-4 times a day if needed - for itch/watery eyes can use OTC Pataday  or Pataday  extra strength 1 drop each eye daily as needed - if medication management is not effective then can consider course of allergen immunotherapy  Eustachian tube dysfunction - ear fullness is likely due to Eustachian tube dysfunction (ETD) however did not improve with trial of Xhance   Follow-up in 6 months or sooner if needed  I appreciate the opportunity to take part in [facility] care. Please do not hesitate to contact me with questions.  Sincerely,   Danita Brain, MD Allergy/Immunology [facility] 

## 2024-09-24 NOTE — Patient Instructions (Addendum)
-   Continue Dupixent  injections every 2 weeks.  Rotate your injection areas (can use arms as well if needed if not self-administering) - Continue Breztri  2 puffs twice a day.   - have access to AIRSUPRA  OR albuterol  inhaler 2 puffs every 4-6 hours as needed for cough/wheeze/shortness of breath/chest tightness.  May use 15-20 minutes prior to activity.   Monitor frequency of use.    Breathing control goals:  Full participation in all desired activities (may need albuterol  before activity) Albuterol  use two time or less a week on average (not counting use with activity) Cough interfering with sleep two time or less a month Oral steroids no more than once a year No hospitalizations  - Continue avoidance measures for dust mites, cat, dog, tree pollen, grass pollen, weed pollens.   - continue Zyrtec  and/or Allegra  dose 1-2 times a day is ok to use depending on symptom severity - continue Atrovent  nasal spray 1 spray daily.  For nasal drainage/runny nose can take up to 3-4 times a day if needed - for itch/watery eyes can use OTC Pataday  or Pataday  extra strength 1 drop each eye daily as needed - if medication management is not effective then can consider course of allergen immunotherapy  - ear fullness is likely due to Eustachian tube dysfunction (ETD) however did not improve with trial of Xhance   Follow-up in 6 months or sooner if needed

## 2024-09-25 ENCOUNTER — Encounter: Payer: Self-pay | Admitting: Allergy

## 2024-10-01 ENCOUNTER — Other Ambulatory Visit: Payer: Self-pay

## 2024-10-13 DIAGNOSIS — M0579 Rheumatoid arthritis with rheumatoid factor of multiple sites without organ or systems involvement: Secondary | ICD-10-CM | POA: Diagnosis not present

## 2024-10-13 DIAGNOSIS — M1991 Primary osteoarthritis, unspecified site: Secondary | ICD-10-CM | POA: Diagnosis not present

## 2024-10-13 DIAGNOSIS — Z79899 Other long term (current) drug therapy: Secondary | ICD-10-CM | POA: Diagnosis not present

## 2024-10-13 DIAGNOSIS — Z6824 Body mass index (BMI) 24.0-24.9, adult: Secondary | ICD-10-CM | POA: Diagnosis not present

## 2024-10-13 DIAGNOSIS — M810 Age-related osteoporosis without current pathological fracture: Secondary | ICD-10-CM | POA: Diagnosis not present

## 2024-10-13 DIAGNOSIS — G5793 Unspecified mononeuropathy of bilateral lower limbs: Secondary | ICD-10-CM | POA: Diagnosis not present

## 2024-10-21 ENCOUNTER — Other Ambulatory Visit (HOSPITAL_COMMUNITY): Payer: Self-pay

## 2024-10-21 ENCOUNTER — Other Ambulatory Visit: Payer: Self-pay | Admitting: *Deleted

## 2024-10-21 DIAGNOSIS — M810 Age-related osteoporosis without current pathological fracture: Secondary | ICD-10-CM

## 2024-10-21 MED ORDER — ROMOSOZUMAB-AQQG 105 MG/1.17ML ~~LOC~~ SOSY
210.0000 mg | PREFILLED_SYRINGE | SUBCUTANEOUS | Status: AC
  Administered 2024-10-23 – 2024-11-24 (×2): 210 mg via SUBCUTANEOUS

## 2024-10-22 ENCOUNTER — Other Ambulatory Visit: Payer: Self-pay | Admitting: Pharmacy Technician

## 2024-10-22 ENCOUNTER — Other Ambulatory Visit: Payer: Self-pay

## 2024-10-22 ENCOUNTER — Other Ambulatory Visit (HOSPITAL_COMMUNITY): Payer: Self-pay

## 2024-10-22 DIAGNOSIS — M0579 Rheumatoid arthritis with rheumatoid factor of multiple sites without organ or systems involvement: Secondary | ICD-10-CM | POA: Diagnosis not present

## 2024-10-22 NOTE — Progress Notes (Signed)
 Specialty Pharmacy Refill Coordination Note  Courtney Crane is a 73 y.o. female contacted today regarding refills of specialty medication(s) Dupilumab  (Dupixent )   Patient requested (Patient-Rptd) Delivery   Delivery date: 10/29/2024 Verified address: (Patient-Rptd) [address] [place], [place] [address]   Medication will be filled on: 10/28/2024

## 2024-10-23 ENCOUNTER — Ambulatory Visit

## 2024-10-23 DIAGNOSIS — M810 Age-related osteoporosis without current pathological fracture: Secondary | ICD-10-CM | POA: Diagnosis not present

## 2024-10-23 NOTE — Progress Notes (Signed)
 Evenity  injection given SQ right arm.  Patient tolerated injection well. Annual exam: 07/16/24 JC  Calcium :     9.5       Date: 06/11/24 GFR: >60                 Upcoming dental procedures: No , will update Dentist she has started Evenity  injections  Hx of Kidney Disease: No   Last Bone Density Scan: 9/4/2

## 2024-10-28 ENCOUNTER — Other Ambulatory Visit: Payer: Self-pay

## 2024-11-06 ENCOUNTER — Ambulatory Visit: Admitting: Obstetrics and Gynecology

## 2024-11-06 ENCOUNTER — Encounter: Payer: Self-pay | Admitting: Obstetrics and Gynecology

## 2024-11-06 VITALS — BP 101/59 | HR 79

## 2024-11-06 DIAGNOSIS — N393 Stress incontinence (female) (male): Secondary | ICD-10-CM | POA: Diagnosis not present

## 2024-11-06 DIAGNOSIS — N3281 Overactive bladder: Secondary | ICD-10-CM

## 2024-11-06 NOTE — Progress Notes (Signed)
 [facility]   Subjective:     Chief Complaint:  Chief Complaint  Patient presents with   Follow-up    Courtney Crane is a 73 y.o. female is here for medication check.   History of Present Illness: Courtney Crane is a 73 y.o. female with stage II posterior POP, stress incontinence and OAB. Last visit she was changed to Gemtesa .   Urgency is well controlled with medication. She has not had any urine leakage. Pessary has been working well for her. She is removing about once a week and placing estrogen cream.    Has had a rash on her bottom for several weeks. She has treated with topical creams but still having some burning and discomfort with sitting.   Past Medical History: Patient  has a past medical history of AKI (acute kidney injury) (11/29/2017), Allergy, Anemia, Anxiety, Anxiety disorder (12/06/2014), Arthritis, Asthma, Bloating (09/19/2023), Bursitis of left hip (09/17/2019), Carpal tunnel syndrome of left wrist (04/24/2017), Cataract, Cellulitis and abscess of buttock (11/28/2017), Chest pain on exertion (06/09/2024), Cholelithiasis with chronic cholecystitis (01/30/2017), Chronic diarrhea (09/19/2023), Claudication (04/09/2019), Colon polyps, Complication of anesthesia, Disorder of bone (09/23/2019), Diverticulitis, Diverticulosis, DOE (dyspnea on exertion) (04/09/2019), Dysphagia (01/19/2020), Encounter for general adult medical examination without abnormal findings (01/30/2018), Essential hypertension, Exertional chest pain (04/09/2019), Family history of ischemic heart disease (IHD) (09/14/2015), Fatty liver, Flatulence (09/19/2023), Genital herpes simplex (06/06/2016), GERD (gastroesophageal reflux disease), Hardening of the aorta (main artery of the heart) (06/08/2024), Hardening of the aorta (main artery of the heart), 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), History of shingles (06/06/2016), HTN (hypertension), Hyperlipidemia (12/06/2014), IBS  (irritable bowel syndrome), 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), Insomnia, unspecified, Iron deficiency anemia (12/06/2014), Irritable 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/01/2023), Leukocytosis (12/04/2017), 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), MRSA infection, Muscle weakness (05/11/2020), 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), Osteoarthritis, Osteopenia, Overactive bladder (06/08/2024), Pain (12/05/2019), Pain involving joint of finger of left hand (04/26/2017), Pain of left lower extremity (08/22/2020), PONV (postoperative nausea and vomiting), Poorly controlled persistent asthma (05/17/2022), Prediabetes, 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   Past Surgical History: She  has a past surgical history that includes Appendectomy (1975); Total abdominal hysterectomy (1988); Tonsillectomy; bladder tack (1990); Rotator cuff repair (Right, 2005); Wrist reconstruction (Right, 2008); Meniscus repair (Right, 2013); Total knee arthroplasty (Left); Cholecystectomy (N/A, 01/31/2017); Incision and drainage perirectal abscess (N/A, 12/02/2017); Cataract extraction, bilateral (Bilateral); Adenoidectomy; Lumbar fusion; Reverse shoulder arthroplasty (Right, 12/06/2022); Eye surgery (September 2020); Joint replacement (2011, 11-2022); Spine surgery (March 2022); LEFT HEART CATH AND CORONARY ANGIOGRAPHY (N/A, 06/11/2024); and Cardiac catheterization (2014).    Medications: She has a current medication list which includes the following prescription(s): acetaminophen , albuterol  (2.5 MG/3ML) 0.083% NEBU 3 mL, albuterol  (5 MG/ML) 0.5% NEBU 0.5 mL, airsupra , amlodipine , vitamin c, aspirin , biotin, bismuth subsalicylate, breztri  aerosphere, calcium  carb-cholecalciferol , tums ultra 1000, cannabidiol, carvedilol , cetirizine , vitamin d -3, dupixent , escitalopram, estradiol , famotidine , ferrous sulfate, fexofenadine , fluocinonide  cream, furosemide , hydrochlorothiazide , infliximab , ipratropium, ipratropium-albuterol , leflunomide, losartan , magnesium gluconate, melatonin, multiple vitamins-minerals, nitroglycerin , fish oil, omeprazole , polyethylene glycol, psyllium, rosuvastatin , simethicone, sodium chloride , temazepam, tretinoin, valacyclovir , and vibegron , and the following Facility-Administered  Medications: romosozumab -aqqg.   Allergies: Patient is allergic to meperidine, methotrexate, nickel, and tape.   Social History: Patient  reports that she has never smoked. She has never been exposed to tobacco smoke. She has never used smokeless tobacco. She reports current alcohol  use of about 7.0 standard drinks of alcohol  per week. She reports that she does not use drugs.      Objective:    Physical Exam: BP (!) 101/59   Pulse 79  Gen: No apparent distress, A&O x 3.  On left buttock, linear well healed rash, appears to have prior vesicles      Assessment/Plan:    Assessment: Courtney Crane is a 73 y.o. with stage II pelvic organ prolapse, stress incontinence, and OAB here for a pessary check. She is doing well.  Plan: - Suspect buttock rash was shingles, although well healed now. She has had shingles before and has a valtrex  prescription.  - Continue #0 incontinence dish pessary. Remove weekly or as needed.  - Start vaginal estrogen cream, ideally twice a week, but place on pessary with insertion once a week with pessary cleaning.  - Continue Gemtesa  75mg   daily  Return 1 year or sooner if needed  Courtney LOISE Caper, MD  Time spent: I spent 20 minutes dedicated to the care of this patient on the date of this encounter to include pre-visit review of records, face-to-face time with the patient and post visit documentation.

## 2024-11-09 ENCOUNTER — Other Ambulatory Visit: Payer: Self-pay | Admitting: Physician Assistant

## 2024-11-15 ENCOUNTER — Other Ambulatory Visit: Payer: Self-pay | Admitting: Gastroenterology

## 2024-11-17 ENCOUNTER — Other Ambulatory Visit: Payer: Self-pay

## 2024-11-18 ENCOUNTER — Other Ambulatory Visit (HOSPITAL_COMMUNITY): Payer: Self-pay

## 2024-11-18 ENCOUNTER — Other Ambulatory Visit: Payer: Self-pay

## 2024-11-18 NOTE — Progress Notes (Signed)
 Specialty Pharmacy Refill Coordination Note  Courtney Crane is a 73 y.o. female contacted today regarding refills of specialty medication(s) Dupilumab  (Dupixent )   Patient requested Delivery   Delivery date: 11/27/24   Verified address: [address] [place], [place] [address]   Medication will be filled on: 11/26/24

## 2024-11-22 ENCOUNTER — Encounter: Payer: Self-pay | Admitting: Allergy

## 2024-11-24 ENCOUNTER — Ambulatory Visit

## 2024-11-24 DIAGNOSIS — M810 Age-related osteoporosis without current pathological fracture: Secondary | ICD-10-CM | POA: Diagnosis not present

## 2024-11-24 NOTE — Progress Notes (Signed)
 Patient here for nurse visit.   3rd Evenity  given SQ left and SQ right arms.  Patient tolerated injections well. Annual exam: 07/16/24 JC  Calcium :     9.5       Date: 06/11/24 GFR: >60                 Upcoming dental procedures: No , will update Dentist she has started Evenity  injections  Hx of Kidney Disease: No   Last Bone Density Scan: 9/4/2

## 2024-11-25 ENCOUNTER — Other Ambulatory Visit: Payer: Self-pay | Admitting: *Deleted

## 2024-11-25 ENCOUNTER — Encounter: Payer: Self-pay | Admitting: Allergy

## 2024-11-25 DIAGNOSIS — M810 Age-related osteoporosis without current pathological fracture: Secondary | ICD-10-CM

## 2024-11-25 MED ORDER — ROMOSOZUMAB-AQQG 105 MG/1.17ML ~~LOC~~ SOSY: 210.0000 mg | PREFILLED_SYRINGE | SUBCUTANEOUS | Status: AC

## 2024-11-26 ENCOUNTER — Other Ambulatory Visit: Payer: Self-pay

## 2024-11-27 ENCOUNTER — Telehealth: Payer: Self-pay

## 2024-11-27 ENCOUNTER — Other Ambulatory Visit (HOSPITAL_COMMUNITY): Payer: Self-pay

## 2024-11-27 NOTE — Telephone Encounter (Signed)
 Evenity  VOB initiated via [email or web address]  Last OV:  Next OV:  Last Evenity  inj: 10/23/24 Next Evenity  inj DUE: NOW

## 2024-11-30 ENCOUNTER — Encounter: Payer: Self-pay | Admitting: *Deleted

## 2024-11-30 ENCOUNTER — Other Ambulatory Visit (HOSPITAL_COMMUNITY): Payer: Self-pay

## 2024-11-30 NOTE — Telephone Encounter (Signed)
 Buy/Bill (Office supplied medication)  Out-of-pocket cost due at time of clinic visit: $482.41  Number of injection/visits approved: ---  Primary: [employer] Co-insurance: 20% Admin fee co-insurance: 0%  Secondary: --- Co-insurance:  Admin fee co-insurance:   Medical Benefit Details: Date Benefits were checked: 11/30/24 Deductible: NO/ Coinsurance: 20%/ Admin Fee: 0%  Prior Auth: N/A PA# Expiration Date:   # of doses approved: -----------------------------------------------------------------------  Patient NOT eligible for Copay Card. Copay Card can make patient's cost as little as $25. Link to apply: https://www.amgensupportplus.[email or web address]/copay  ** This summary of benefits is an estimation of the patient's out-of-pocket cost. Exact cost may very based on individual plan coverage.

## 2024-12-01 ENCOUNTER — Other Ambulatory Visit: Payer: Self-pay | Admitting: *Deleted

## 2024-12-01 MED ORDER — AZITHROMYCIN 250 MG PO TABS: ORAL_TABLET | ORAL | 0 refills | Status: AC

## 2024-12-03 NOTE — Telephone Encounter (Signed)
 See referral

## 2024-12-16 ENCOUNTER — Ambulatory Visit: Attending: Pulmonary Disease

## 2024-12-16 ENCOUNTER — Ambulatory Visit (HOSPITAL_BASED_OUTPATIENT_CLINIC_OR_DEPARTMENT_OTHER): Admitting: Pulmonary Disease

## 2024-12-16 ENCOUNTER — Encounter (HOSPITAL_BASED_OUTPATIENT_CLINIC_OR_DEPARTMENT_OTHER): Payer: Self-pay | Admitting: Pulmonary Disease

## 2024-12-16 ENCOUNTER — Other Ambulatory Visit: Payer: Self-pay

## 2024-12-16 VITALS — BP 134/68 | HR 69 | Ht 62.0 in | Wt 129.9 lb

## 2024-12-16 DIAGNOSIS — B379 Candidiasis, unspecified: Secondary | ICD-10-CM

## 2024-12-16 DIAGNOSIS — B370 Candidal stomatitis: Secondary | ICD-10-CM

## 2024-12-16 DIAGNOSIS — J4551 Severe persistent asthma with (acute) exacerbation: Secondary | ICD-10-CM

## 2024-12-16 DIAGNOSIS — J4550 Severe persistent asthma, uncomplicated: Secondary | ICD-10-CM

## 2024-12-16 MED ORDER — NYSTATIN 100000 UNIT/ML MT SUSP: 5.0000 mL | Freq: Four times a day (QID) | OROMUCOSAL | 0 refills | Status: AC

## 2024-12-16 MED ORDER — DUPIXENT 300 MG/2ML ~~LOC~~ SOAJ
300.0000 mg | SUBCUTANEOUS | 3 refills | Status: AC
  Filled 2024-12-16: qty 4, 28d supply, fill #0

## 2024-12-16 NOTE — Progress Notes (Signed)
 Specialty Pharmacy Refill Coordination Note  Courtney Crane is a 73 y.o. female contacted today regarding refills of specialty medication(s) Dupilumab  (Dupixent )   Patient requested Delivery   Delivery date: 12/21/24   Verified address: [address]   Courtney Crane [place] [address]   Medication will be filled on: 12/24/24

## 2024-12-16 NOTE — Progress Notes (Signed)
 [facility]  Referring Provider: Dr. Kassie  Virtual Visit via Telephone Note  I connected with Almarie Cushing on 12/16/24 at  2:30 PM EST by telephone and verified that I am speaking with the correct person using two identifiers.  Location: Patient: home Provider: office   I discussed the limitations, risks, security and privacy concerns of performing an evaluation and management service by telephone and the availability of in person appointments. I also discussed with the patient that there may be a patient responsible charge related to this service. The patient expressed understanding and agreed to proceed.   HPI: Courtney Crane is a 74 y.o. female who presents to the pharmacotherapy clinic via telephone for follow-up Dupixent  education and counseling.  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   Hardening of the aorta (main artery of the heart) 06/08/2024   Overactive bladder 06/08/2024   Spinal stenosis of lumbar region 06/08/2024   Thyroid  nodule 06/08/2024   Type 2 diabetes mellitus with other specified complication (HCC) 06/08/2024   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   Chronic diarrhea 09/19/2023   Flatulence 09/19/2023   Bloating 09/19/2023   Right shoulder pain 12/06/2022   History of right shoulder replacement 12/06/2022   Poorly controlled persistent asthma 05/17/2022    Diverticulitis 06/05/2021   Osteopenia 06/05/2021   S/P lumbar fusion 01/23/2021   Pain of left lower extremity 08/22/2020   Strain of muscle, fascia and tendon of the posterior muscle group at thigh level, right thigh, subsequent encounter 08/15/2020   Muscle weakness 05/11/2020   Dysphagia 01/19/2020   Pain 12/05/2019   Disorder of bone 09/23/2019   Bursitis of left hip 09/17/2019   DOE (dyspnea on exertion) 04/09/2019   Exertional chest pain 04/09/2019   Claudication 04/09/2019   Myopathy 03/31/2019   Encounter for general adult medical examination without abnormal findings 01/30/2018   Leukocytosis 12/04/2017   AKI (acute kidney injury) 11/29/2017   Rheumatoid arthritis (HCC) 11/29/2017   Cellulitis and abscess of buttock 11/28/2017   Pain involving joint of finger of left hand 04/26/2017   Carpal tunnel syndrome of left wrist 04/24/2017   Cholelithiasis with chronic cholecystitis 01/30/2017   History of shingles 06/06/2016   Genital herpes simplex 06/06/2016   Seasonal allergies 06/06/2016   Seborrheic dermatitis    Prediabetes    Anemia    GERD (gastroesophageal reflux disease)    Vitamin D  deficiency    Insomnia, unspecified    Essential hypertension    Family history of ischemic heart disease (IHD) 09/14/2015   Anxiety disorder 12/06/2014   Hyperlipidemia 12/06/2014   Iron deficiency anemia 12/06/2014   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   Uncomplicated asthma 12/06/2014    Patient's Medications  New Prescriptions   NYSTATIN  (MYCOSTATIN ) 100000 UNIT/ML SUSPENSION    Take 5  mLs (500,000 Units total) by mouth 4 (four) times daily for 7 days.  Previous Medications   ACETAMINOPHEN  (TYLENOL ) 500 MG TABLET    Take 1,000 mg by mouth every 6 (six) hours as needed for mild pain (pain score 1-3) or moderate pain (pain score 4-6).   ALBUTEROL  (2.5 MG/3ML) 0.083% NEBU 3 ML, ALBUTEROL  (5 MG/ML) 0.5% NEBU 0.5 ML    Inhale 5 mg into the lungs every 6 (six) hours as  needed (asthma).   ALBUTEROL -BUDESONIDE (AIRSUPRA ) 90-80 MCG/ACT AERO    Inhale 2 puffs into the lungs every 4 (four) hours as needed.   AMLODIPINE  (NORVASC ) 10 MG TABLET    Take 10 mg by mouth in the morning.   ASCORBIC ACID  (VITAMIN C) 1000 MG TABLET    Take 1,000 mg by mouth daily.   ASPIRIN  81 PO       AZITHROMYCIN  (ZITHROMAX ) 250 MG TABLET    Azithromycin  500mg  (2- 250mg  tabs day 1) then 250mg  (1- 250mg  tab day 2-5).   BIOTIN 10 MG CHEW       BISMUTH SUBSALICYLATE (PEPTO BISMOL) 262 MG CHEWABLE TABLET    Chew 262-524 mg by mouth 3 (three) times daily as needed for diarrhea or loose stools or indigestion.   BUDESONIDE-GLYCOPYRROLATE-FORMOTEROL (BREZTRI  AEROSPHERE) 160-9-4.8 MCG/ACT AERO INHALER    Inhale 2 puffs into the lungs in the morning and at bedtime.   CALCIUM  CARB-CHOLECALCIFEROL  (CALCIUM  600+D3 PO)    Take 1 tablet by mouth in the morning.   CALCIUM  ELEMENTAL AS CARBONATE (TUMS ULTRA 1000) 400 MG CHEWABLE TABLET    Chew 1,000 mg by mouth 3 (three) times daily as needed for heartburn.   CANNABIDIOL PO    Take 1 capsule by mouth at bedtime. CBD   CARVEDILOL  (COREG ) 3.125 MG TABLET    Take 1 tablet (3.125 mg total) by mouth 2 (two) times daily.   CETIRIZINE  (ZYRTEC ) 10 MG TABLET    Take 10 mg by mouth daily as needed for allergies.   CHOLECALCIFEROL  (VITAMIN D -3) 125 MCG (5000 UT) TABS    Take 5,000 Units by mouth in the morning.   DUPILUMAB  (DUPIXENT ) 300 MG/2ML SOAJ    Inject 300 mg into the skin every 14 (fourteen) days.   ESCITALOPRAM (LEXAPRO) 5 MG TABLET    Take 5 mg by mouth daily.   ESTRADIOL  (ESTRACE ) 0.01 % CREA VAGINAL CREAM    Place 0.5g at night twice a week   FAMOTIDINE  (PEPCID ) 40 MG TABLET    TAKE 1 TABLET BY MOUTH EVERYDAY AT BEDTIME   FERROUS SULFATE ([employer] SLOW RELEASE IRON PO)    Take 45 mg by mouth every other day.   FEXOFENADINE  (ALLEGRA ) 180 MG TABLET    Take 0.5 tablets (90 mg total) by mouth in the morning.   FLUOCINONIDE  CREAM (LIDEX ) 0.05 %    SMARTSIG:1  sparingly Topical Twice Daily   FUROSEMIDE  (LASIX ) 20 MG TABLET    Take 20 mg (1 tablet) daily for 3 days and then decrease to 20 mg (1 tablet) every other day.   HYDROCHLOROTHIAZIDE  (HYDRODIURIL ) 12.5 MG TABLET    Take 12.5 mg by mouth every morning.   INFLIXIMAB  (REMICADE ) 100 MG INJECTION    Inject into the vein every 5 (five) weeks.   IPRATROPIUM (ATROVENT ) 0.06 % NASAL SPRAY    Place 1 spray into both nostrils daily.   IPRATROPIUM-ALBUTEROL  (DUONEB) 0.5-2.5 (3) MG/3ML SOLN    Take 3 mLs by nebulization every 4 (four) hours as needed.  LEFLUNOMIDE (ARAVA) 20 MG TABLET    Take 20 mg by mouth every evening.   LOSARTAN  (COZAAR ) 100 MG TABLET    Take 100 mg by mouth daily.   MAGNESIUM GLUCONATE (MAGONATE) 500 (27 MG) MG TABS TABLET    Take 500 mg by mouth daily.   MELATONIN PO    Take 1.5 mg by mouth at bedtime as needed (sleep).   MULTIPLE VITAMINS-MINERALS (IMMUNE SUPPORT VITAMIN C PO)    Take 1 tablet by mouth in the morning. With Vitamin D  3/Vitamin A/Magnesium   NITROGLYCERIN  (NITROSTAT ) 0.4 MG SL TABLET    Place 1 tablet (0.4 mg total) under the tongue every 5 (five) minutes as needed.   OMEGA-3 FATTY ACIDS (FISH OIL) 1200 MG CAPS    Take 1,200 mg by mouth 2 (two) times daily.   OMEPRAZOLE  (PRILOSEC) 40 MG CAPSULE    TAKE 1 CAPSULE (40 MG TOTAL) BY MOUTH 2 (TWO) TIMES DAILY BEFORE A MEAL.   POLYETHYLENE GLYCOL (MIRALAX  / GLYCOLAX ) 17 G PACKET    Take 17 g by mouth daily as needed for moderate constipation. In the morning   PSYLLIUM (METAMUCIL 4 IN 1 FIBER PO)    Take 2 capsules by mouth in the morning. Metamucil 3-in-1 Psyllium Fiber Supplement Capsules   ROSUVASTATIN  (CRESTOR ) 10 MG TABLET    Take 10 mg by mouth every evening.   SIMETHICONE (MYLICON) 125 MG CHEWABLE TABLET    Chew 125 mg by mouth every 6 (six) hours as needed for flatulence.   SODIUM CHLORIDE  (OCEAN) 0.65 % SOLN NASAL SPRAY    Place 1 spray into both nostrils in the morning and at bedtime.   TEMAZEPAM (RESTORIL) 15 MG  CAPSULE    Take 15 mg by mouth at bedtime.   TRETINOIN (RETIN-A) 0.025 % CREAM    Apply 1 Application topically at bedtime.   VALACYCLOVIR  (VALTREX ) 500 MG TABLET    Take 500 mg by mouth daily as needed (outbreaks).   VIBEGRON  75 MG TABS    Take 1 tablet (75 mg total) by mouth daily.  Modified Medications   No medications on file  Discontinued Medications   No medications on file    Allergies: Allergies[1]  Past Medical History: Past Medical History:  Diagnosis Date   AKI (acute kidney injury) 11/29/2017   Allergy    Anemia    Anxiety    Anxiety disorder 12/06/2014   Arthritis    Asthma    Bloating 09/19/2023   Bursitis of left hip 09/17/2019   Carpal tunnel syndrome of left wrist 04/24/2017   Cataract    Cellulitis and abscess of buttock 11/28/2017   Chest pain on exertion 06/09/2024   Cholelithiasis with chronic cholecystitis 01/30/2017   Chronic diarrhea 09/19/2023   Claudication 04/09/2019   Colon polyps    Complication of anesthesia    sometimes I dont like to wake up after  ie difficult to awaken    Disorder of bone 09/23/2019   Diverticulitis    Diverticulosis    DOE (dyspnea on exertion) 04/09/2019   Dysphagia 01/19/2020   Encounter for general adult medical examination without abnormal findings 01/30/2018   Essential hypertension    Uncontrolled, Norvasc  10 MG daily     Exertional chest pain 04/09/2019   Family history of ischemic heart disease (IHD) 09/14/2015   Fatty liver    Flatulence 09/19/2023   Genital herpes simplex 06/06/2016   GERD (gastroesophageal reflux disease)    Hardening of the aorta (main artery  of the heart) 06/08/2024   Hardening of the aorta (main artery of the heart)    History of right shoulder replacement 12/06/2022   History of shingles 06/06/2016   HTN (hypertension)    Hyperlipidemia 12/06/2014   IBS (irritable bowel syndrome)    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   Insomnia, unspecified    On Melatonin, controlled      Iron deficiency anemia 12/06/2014   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   Leukocytosis 12/04/2017   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   MRSA infection    Muscle weakness 05/11/2020   Myopathy 03/31/2019   Osteoarthritis    Osteopenia    Overactive bladder 06/08/2024   Pain 12/05/2019   Pain involving joint of finger of left hand 04/26/2017   Pain of left lower extremity 08/22/2020   PONV (postoperative nausea and vomiting)    Poorly controlled persistent asthma 05/17/2022   Prediabetes    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   Rheumatoid arthritis (HCC)    Right shoulder pain 12/06/2022   S/P lumbar fusion 01/23/2021   Seasonal allergies    Seborrheic dermatitis    Shingles    Spinal stenosis of lumbar region 06/08/2024   Status post dilation of esophageal narrowing    Strain of muscle, fascia and tendon of the posterior muscle group at thigh level, right thigh, subsequent encounter 08/15/2020   Thyroid  nodule 06/08/2024   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   Type 2 diabetes mellitus with other specified complication (HCC) 06/08/2024   Uncomplicated asthma 12/06/2014   Vitamin D  deficiency     Social History: Social History   Socioeconomic History   Marital status: Married    Spouse name: Not on file   Number of children: 2   Years of education: Not on file   Highest education level: Not on file  Occupational History   Occupation: retired  Tobacco Use   Smoking status: Never    Passive exposure: Never   Smokeless tobacco: Never  Vaping Use   Vaping status: Never Used  Substance and Sexual Activity   Alcohol  use: Yes    Alcohol /week: 7.0 standard drinks of alcohol     Types: 7 Standard drinks or equivalent per week    Comment: wine daily   Drug use: No    Comment: takes CBD oil   Sexual activity: Not Currently    Partners: Male    Birth control/protection: Post-menopausal    Comment: Hx HSV, High RISK Medicare, no to all other  Medicare screening questions, less than 5 lifetime partners  Other Topics Concern   Not on file  Social History Narrative   Retired from airline pilot   Lives at home with husband ; one level home   Right handed   Highest level of education:  Some college   Social Drivers of Health   Tobacco Use: Low Risk (12/16/2024)   Patient History    Smoking Tobacco Use: Never    Smokeless Tobacco Use: Never    Passive Exposure: Never  Physicist, Medical Strain: Not on file  Food Insecurity: Not on file  Transportation Needs: Not on file  Physical Activity: Not on file  Stress: Not on file  Social Connections: Not on file  Depression (EYV7-0): Not on file  Alcohol  Screen: Not on file  Housing: Not on file  Utilities: Not on file  Health Literacy: Not on file    Biologics training for dupilumab  (Dupixent )  Goals of therapy: Mechanism: human monoclonal IgG4 antibody  that inhibits interleukin-4 and interleukin-13 cytokine-induced responses, including release of proinflammatory cytokines, chemokines, and IgE Reviewed that Dupixent  is add-on medication and patient must continue maintenance inhaler regimen. Response to therapy: may take 4 months to determine efficacy. Discussed that patients generally feel improvement sooner than 4 months.  Side effects: injection site reaction (6-18%), antibody development (5-16%), ophthalmic conjunctivitis (2-16%), transient blood eosinophilia (1-2%)  Dose: continuing Dupixent  300mg  [place] every 14 days  Administration/Storage:  Reviewed administration sites of thigh or abdomen (at least 2-3 inches away from abdomen). Reviewed the upper arm is only appropriate if caregiver is administering injection  Do not shake pen/syringe as this could lead to product foaming or precipitation. Do not use if solution is discolored or contains particulate matter or if window on prefilled pen is yellow (indicates pen has been used).  Reviewed storage of medication in refrigerator.  Reviewed that Dupixent  can be stored at room temperature in unopened carton for up to 14 days.  Access: Approval of Dupixent  through: insurance   Medication Reconciliation  A drug regimen assessment was performed, including review of allergies, interactions, disease-state management, dosing and immunization history. Medications were reviewed with the patient, including name, instructions, indication, goals of therapy, potential side effects, importance of adherence, and safe use.  Plan: - CONTINUE Dupixent  300mg  [place] every 14 days - Rx triaged to [facility].  I discussed the assessment and treatment plan with the patient. The patient was provided an opportunity to ask questions and all were answered. The patient agreed with the plan and demonstrated an understanding of the instructions.   The patient was advised to call back or seek an in-person evaluation if the symptoms worsen or if the condition fails to improve as anticipated.  I provided 10 minutes of non-face-to-face time during this encounter.  Aleck Puls, PharmD, BCPS, CPP Clinical Pharmacist  [facility]  [facility]     [1]  Allergies Allergen Reactions   Meperidine Nausea Only   Methotrexate Other (See Comments)    Unknown reaction   Nickel Rash   Tape Rash and Other (See Comments)

## 2024-12-16 NOTE — Progress Notes (Signed)
 "   Subjective:   PATIENT ID: Courtney Crane GENDER: female DOB: Jan 31, 1951, MRN: [id number]  Chief Complaint  Patient presents with   Asthma    Transfer of Care    Reason for Visit: Follow-up      Courtney Crane is a 74 y.o. female with severe persistent asthma, hx childhood asthma previously on allergy shots, HTN, DM2, GERD, IBS, osteopenia and RA who presents for asthma follow-up and to establish care with new pulmonary physician.  She is on Breztri , Airsupra  and Dupixent  which was started in August 2023. Follows Allergy with Dr. Jeneal. Takes allegra  daily and zyrtec  as needed. Overall well controlled. Usually has 1-2 exacerbations annually requiring treatment. Prior to Dupixent  she had 5-6 exacerbations in the past. Last exacerbation 11/2024 a few weeks ago treated with prednisone  and antibiotics complicated by thrush. Had to use nebulizer due to the severity of exacerbation. Prefers to avoid prednisone  due to insomnia and skin crawling sensation. Symptoms worse in spring and fall and will start with nasal/chest congestion that will lead to exacerbation.  Has had long standing for at least 1-1.5 years of chronic cough with sputum production throughout the day.  Social History: Never smoker  Environmental exposures:  None Family has dogs. Patient has allergies    12/16/2024 Discussed the use of AI scribe software for clinical note transcription with the patient, who gave verbal consent to proceed.  History of Present Illness     Asthma Control Test ACT Total Score  12/16/2024  1:48 PM 22  09/01/2024 12:51 PM 20  02/25/2024  1:27 PM 23   2025 Jan Feb Mar April May June July Aug Sept Oct Nov Dec        X   X     2026 Jan Feb Mar April May June July Aug Sept Oct Nov Dec   X             2027 Jan Feb Mar April May June July Aug Sept Oct Nov Dec                     Past Medical History:  Diagnosis Date   AKI (acute kidney injury) 11/29/2017   Allergy     Anemia    Anxiety    Anxiety disorder 12/06/2014   Arthritis    Asthma    Bloating 09/19/2023   Bursitis of left hip 09/17/2019   Carpal tunnel syndrome of left wrist 04/24/2017   Cataract    Cellulitis and abscess of buttock 11/28/2017   Chest pain on exertion 06/09/2024   Cholelithiasis with chronic cholecystitis 01/30/2017   Chronic diarrhea 09/19/2023   Claudication 04/09/2019   Colon polyps    Complication of anesthesia    sometimes I dont like to wake up after  ie difficult to awaken    Disorder of bone 09/23/2019   Diverticulitis    Diverticulosis    DOE (dyspnea on exertion) 04/09/2019   Dysphagia 01/19/2020   Encounter for general adult medical examination without abnormal findings 01/30/2018   Essential hypertension    Uncontrolled, Norvasc  10 MG daily     Exertional chest pain 04/09/2019   Family history of ischemic heart disease (IHD) 09/14/2015   Fatty liver    Flatulence 09/19/2023   Genital herpes simplex 06/06/2016   GERD (gastroesophageal reflux disease)    Hardening of the aorta (main artery of the heart) 06/08/2024   Hardening of the aorta (main artery of the heart)    History  of right shoulder replacement 12/06/2022   History of shingles 06/06/2016   HTN (hypertension)    Hyperlipidemia 12/06/2014   IBS (irritable bowel syndrome)    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   Insomnia, unspecified    On Melatonin, controlled     Iron deficiency anemia 12/06/2014   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   Leukocytosis 12/04/2017   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   MRSA infection    Muscle weakness 05/11/2020   Myopathy 03/31/2019   Osteoarthritis    Osteopenia    Overactive bladder 06/08/2024   Pain 12/05/2019   Pain involving joint of finger of left hand 04/26/2017   Pain of left lower extremity 08/22/2020   PONV (postoperative nausea and vomiting)    Poorly controlled persistent asthma 05/17/2022   Prediabetes     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   Rheumatoid arthritis (HCC)    Right shoulder pain 12/06/2022   S/P lumbar fusion 01/23/2021   Seasonal allergies    Seborrheic dermatitis    Shingles    Spinal stenosis of lumbar region 06/08/2024   Status post dilation of esophageal narrowing    Strain of muscle, fascia and tendon of the posterior muscle group at thigh level, right thigh, subsequent encounter 08/15/2020   Thyroid  nodule 06/08/2024   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   Type 2 diabetes mellitus with other specified complication (HCC) 06/08/2024   Uncomplicated asthma 12/06/2014   Vitamin D  deficiency      Family History  Problem Relation Age of Onset   Colon polyps Mother    Diabetes Mother    Heart disease Mother    Kidney cancer Mother    Arthritis Mother    Hypertension Mother    Hyperlipidemia Mother    Cancer Mother    Hearing loss Mother    Kidney disease Mother    Heart attack Mother        stints and bypass   Dementia Father    CAD Brother    Esophageal cancer Brother    Colon polyps Son    Colon cancer Neg Hx    Stomach cancer Neg Hx    Rectal cancer Neg Hx    Prostate cancer Neg Hx    Pancreatic cancer Neg Hx      Social History   Occupational History   Occupation: retired  Tobacco Use   Smoking status: Never    Passive exposure: Never   Smokeless tobacco: Never  Vaping Use   Vaping status: Never Used  Substance and Sexual Activity   Alcohol  use: Yes    Alcohol /week: 7.0 standard drinks of alcohol     Types: 7 Standard drinks or equivalent per week    Comment: wine daily   Drug use: No    Comment: takes CBD oil   Sexual activity: Not Currently    Partners: Male    Birth control/protection: Post-menopausal    Comment: Hx HSV, High RISK Medicare, no to all other Medicare screening questions, less than 5 lifetime partners    Allergies[1]   Outpatient Medications Prior to Visit  Medication Sig Dispense Refill   acetaminophen   (TYLENOL ) 500 MG tablet Take 1,000 mg by mouth every 6 (six) hours as needed for mild pain (pain score 1-3) or moderate pain (pain score 4-6).     albuterol  (2.5 MG/3ML) 0.083% NEBU 3 mL, albuterol  (5 MG/ML) 0.5% NEBU 0.5 mL Inhale 5 mg into the lungs every 6 (six) hours as needed (asthma).  Albuterol -Budesonide (AIRSUPRA ) 90-80 MCG/ACT AERO Inhale 2 puffs into the lungs every 4 (four) hours as needed. 10.7 g 2   amLODipine  (NORVASC ) 10 MG tablet Take 10 mg by mouth in the morning.     Ascorbic Acid  (VITAMIN C) 1000 MG tablet Take 1,000 mg by mouth daily.     ASPIRIN  81 PO      azithromycin  (ZITHROMAX ) 250 MG tablet Azithromycin  500mg  (2- 250mg  tabs day 1) then 250mg  (1- 250mg  tab day 2-5). 6 each 0   Biotin 10 MG CHEW      bismuth subsalicylate (PEPTO BISMOL) 262 MG chewable tablet Chew 262-524 mg by mouth 3 (three) times daily as needed for diarrhea or loose stools or indigestion.     budesonide-glycopyrrolate-formoterol (BREZTRI  AEROSPHERE) 160-9-4.8 MCG/ACT AERO inhaler Inhale 2 puffs into the lungs in the morning and at bedtime. 10.7 g 5   Calcium  Carb-Cholecalciferol  (CALCIUM  600+D3 PO) Take 1 tablet by mouth in the morning.     calcium  elemental as carbonate (TUMS ULTRA 1000) 400 MG chewable tablet Chew 1,000 mg by mouth 3 (three) times daily as needed for heartburn.     CANNABIDIOL PO Take 1 capsule by mouth at bedtime. CBD     carvedilol  (COREG ) 3.125 MG tablet Take 1 tablet (3.125 mg total) by mouth 2 (two) times daily. 180 tablet 3   cetirizine  (ZYRTEC ) 10 MG tablet Take 10 mg by mouth daily as needed for allergies.     Cholecalciferol  (VITAMIN D -3) 125 MCG (5000 UT) TABS Take 5,000 Units by mouth in the morning.     Dupilumab  (DUPIXENT ) 300 MG/2ML SOAJ Inject 300 mg into the skin every 14 (fourteen) days. 4 mL 3   escitalopram (LEXAPRO) 5 MG tablet Take 5 mg by mouth daily.     estradiol  (ESTRACE ) 0.01 % CREA vaginal cream Place 0.5g at night twice a week 42.5 g 11   famotidine   (PEPCID ) 40 MG tablet TAKE 1 TABLET BY MOUTH EVERYDAY AT BEDTIME 90 tablet 3   Ferrous Sulfate ([employer] SLOW RELEASE IRON PO) Take 45 mg by mouth every other day.     fexofenadine  (ALLEGRA ) 180 MG tablet Take 0.5 tablets (90 mg total) by mouth in the morning. 30 tablet 5   fluocinonide  cream (LIDEX ) 0.05 % SMARTSIG:1 sparingly Topical Twice Daily     furosemide  (LASIX ) 20 MG tablet Take 20 mg (1 tablet) daily for 3 days and then decrease to 20 mg (1 tablet) every other day. 90 tablet 3   hydrochlorothiazide  (HYDRODIURIL ) 12.5 MG tablet Take 12.5 mg by mouth every morning.     inFLIXimab  (REMICADE ) 100 MG injection Inject into the vein every 5 (five) weeks.     ipratropium (ATROVENT ) 0.06 % nasal spray Place 1 spray into both nostrils daily. 15 mL 5   ipratropium-albuterol  (DUONEB) 0.5-2.5 (3) MG/3ML SOLN Take 3 mLs by nebulization every 4 (four) hours as needed. 360 mL 10   leflunomide (ARAVA) 20 MG tablet Take 20 mg by mouth every evening.     losartan  (COZAAR ) 100 MG tablet Take 100 mg by mouth daily.     magnesium gluconate (MAGONATE) 500 (27 Mg) MG TABS tablet Take 500 mg by mouth daily.     MELATONIN PO Take 1.5 mg by mouth at bedtime as needed (sleep).     Multiple Vitamins-Minerals (IMMUNE SUPPORT VITAMIN C PO) Take 1 tablet by mouth in the morning. With Vitamin D  3/Vitamin A/Magnesium     nitroGLYCERIN  (NITROSTAT ) 0.4 MG SL tablet Place 1 tablet (  0.4 mg total) under the tongue every 5 (five) minutes as needed. 25 tablet 6   Omega-3 Fatty Acids (FISH OIL) 1200 MG CAPS Take 1,200 mg by mouth 2 (two) times daily.     omeprazole  (PRILOSEC) 40 MG capsule TAKE 1 CAPSULE (40 MG TOTAL) BY MOUTH 2 (TWO) TIMES DAILY BEFORE A MEAL. 180 capsule 0   polyethylene glycol (MIRALAX  / GLYCOLAX ) 17 g packet Take 17 g by mouth daily as needed for moderate constipation. In the morning     Psyllium (METAMUCIL 4 IN 1 FIBER PO) Take 2 capsules by mouth in the morning. Metamucil 3-in-1 Psyllium Fiber Supplement  Capsules     rosuvastatin  (CRESTOR ) 10 MG tablet Take 10 mg by mouth every evening.     simethicone (MYLICON) 125 MG chewable tablet Chew 125 mg by mouth every 6 (six) hours as needed for flatulence.     sodium chloride  (OCEAN) 0.65 % SOLN nasal spray Place 1 spray into both nostrils in the morning and at bedtime.     temazepam (RESTORIL) 15 MG capsule Take 15 mg by mouth at bedtime.     tretinoin (RETIN-A) 0.025 % cream Apply 1 Application topically at bedtime.     valACYclovir  (VALTREX ) 500 MG tablet Take 500 mg by mouth daily as needed (outbreaks).     Vibegron  75 MG TABS Take 1 tablet (75 mg total) by mouth daily. 30 tablet 11   Facility-Administered Medications Prior to Visit  Medication Dose Route Frequency Provider Last Rate Last Admin   Romosozumab -aqqg (EVENITY ) 105 MG/1. injection 210 mg  210 mg Subcutaneous Q30 days Chrzanowski, Jami B, NP   210 mg at 11/24/24 1342   [START ON 12/25/2024] Romosozumab -aqqg (EVENITY ) 105 MG/1. injection 210 mg  210 mg Subcutaneous Q30 days Chrzanowski, Jami B, NP        Review of Systems  Constitutional:  Negative for chills, diaphoresis, fever, malaise/fatigue and weight loss.  HENT:  Negative for congestion.   Respiratory:  Negative for cough, hemoptysis, sputum production, shortness of breath and wheezing.   Cardiovascular:  Negative for chest pain, palpitations and leg swelling.     Objective:   Vitals:   12/16/24 1346  BP: 134/68  Pulse: 69  SpO2: 98%  Weight: 129 lb 14.4 oz (58.9 kg)  Height: 5' 2 (1.575 m)   SpO2: 98 %  Physical Exam Physical Exam: General: Well-appearing, no acute distress HENT: [place], AT Eyes: EOMI, no scleral icterus Respiratory: Clear to auscultation bilaterally.  No crackles, wheezing or rales Cardiovascular: RRR, -M/R/G, no JVD Extremities:-Edema,-tenderness Neuro: AAO x4, CNII-XII grossly intact Psych: Normal mood, normal affect    Data Reviewed:  Imaging: CXR 05/16/24 - No infiltrate,  effusion or edema  PFT: Spirometry 03/20/24 FVC 2.56 (100%) FEV1 1.95 (97%) Ratio 76  (LLN 64) Interpretation: No obstructive defect  Labs: CBC    Component Value Date/Time   WBC 6.8 08/04/2024 1332   WBC 6.2 06/11/2024 1020   RBC 4.11 08/04/2024 1332   HGB 13.4 08/04/2024 1332   HCT 39.7 08/04/2024 1332   PLT 152 08/04/2024 1332   MCV 96.6 08/04/2024 1332   MCH 32.6 08/04/2024 1332   MCHC 33.8 08/04/2024 1332   RDW 14.0 08/04/2024 1332   LYMPHSABS 1.6 08/04/2024 1332   MONOABS 0.7 08/04/2024 1332   EOSABS 0.1 08/04/2024 1332   BASOSABS 0.1 08/04/2024 1332   Absolute eos 08/07/24 - 100     Assessment & Plan:   Discussion: 74 y.o. female with severe persistent  asthma, hx childhood asthma previously on allergy shots, HTN, DM2, GERD, IBS, osteopenia and RA who presents for asthma follow-up and to establish care with new pulmonary physician. Reviewed history. Overall well controlled on current bronchodilator regimen with 1-2 outpatient exacerbations annually. However has persistent productive cough for the last year concerning for worsening baseline asthma  If PFTs worsening, consider stronger ICS/LABA plus LAMA combination. For now will add additional sprays of ICS/SABA midday.    Assessment & Plan Severe persistent asthma without complication (HCC) --CONTINUE Breztri  TWO puffs in the morning and evening --START Airsupra  2 puffs mid-day and as needed --CONTINUE nebulizer when sick  --Consider singulair if symptoms worsen  --CONTINUE Dupixent  shots as scheduled --ORDER pulmonary function test Thrush Completed nystatin  Additional 7 day course of Nystatin  in case thrush recurs with new inhaler change  Health Maintenance Immunization History  Administered Date(s) Administered   Fluad Quad(high Dose 65+) 09/03/2022   Fluzone Influenza virus vaccine,trivalent (IIV3), split virus 11/19/2013   Influenza, Quadrivalent, Recombinant, Inj, Pf 08/16/2018, 09/03/2019, 08/27/2020    Influenza,inj,quad, With Preservative 07/20/2018, 08/20/2019   Influenza-Unspecified 10/31/2015, 10/30/2017, 10/02/2023   PFIZER(Purple Top)SARS-COV-2 Vaccination 07/14/2020, 02/17/2021   Pneumococcal Polysaccharide-23 10/30/2017   Tdap 11/20/2011   Zoster, Live 11/19/2012   CT Lung Screen - not qualified  Orders Placed This Encounter  Procedures   Pulmonary function test    Standing Status:   Future    Expiration Date:   12/16/2025    Where should this test be performed?:   Outpatient Pulmonary    What type of PFT is being ordered?:   Full PFT   Meds ordered this encounter  Medications   nystatin  (MYCOSTATIN ) 100000 UNIT/ML suspension    Sig: Take 5 mLs (500,000 Units total) by mouth 4 (four) times daily for 7 days.    Dispense:  140 mL    Refill:  0    Return in about 6 months (around 06/15/2025).  I have spent a total time of 40-minutes on the day of the appointment reviewing prior documentation, coordinating care and discussing medical diagnosis and plan with the patient/family. Imaging, labs and tests included in this note have been reviewed and interpreted independently by me. This note is generated using [employer] programming. Patient/family has given consent.  Courtney Crane Staff, MD [facility] 12/16/2024 3:58 PM        [1]  Allergies Allergen Reactions   Meperidine Nausea Only   Methotrexate Other (See Comments)    Unknown reaction   Nickel Rash   Tape Rash and Other (See Comments)   "

## 2024-12-16 NOTE — Assessment & Plan Note (Addendum)
--  CONTINUE Breztri  TWO puffs in the morning and evening --START Airsupra  2 puffs mid-day and as needed --CONTINUE nebulizer when sick  --Consider singulair if symptoms worsen  --CONTINUE Dupixent  shots as scheduled --ORDER pulmonary function test

## 2024-12-16 NOTE — Patient Instructions (Addendum)
 Severe persistent asthma without complication (HCC) --CONTINUE Breztri  TWO puffs in the morning and evening --START Airsupra  2 puffs mid-day and as needed --CONTINUE nebulizer when sick  --Consider singulair if symptoms worsen  --CONTINUE Dupixent  shots as scheduled --ORDER pulmonary function test  Thrush Completed nystatin  Additional 7 day course of Nystatin  in case thrush recurs with new inhaler change

## 2024-12-16 NOTE — Addendum Note (Signed)
 Addended by: Caitlynn Ju L on: 12/16/2024 04:56 PM   Modules accepted: Orders

## 2024-12-17 ENCOUNTER — Telehealth: Payer: Self-pay

## 2024-12-17 ENCOUNTER — Other Ambulatory Visit (HOSPITAL_COMMUNITY): Payer: Self-pay

## 2024-12-17 ENCOUNTER — Other Ambulatory Visit: Payer: Self-pay

## 2024-12-17 NOTE — Telephone Encounter (Signed)
 Received notification via specialty pharmacy encounter that Dupixent  requires pa. Submitted a Prior Authorization request to [facility] [employer] for DUPIXENT  via CoverMyMeds. Will update once we receive a response.  Key: [id number]

## 2024-12-17 NOTE — Telephone Encounter (Signed)
 Received notification from [employer] regarding a prior authorization for DUPIXENT . Authorization has been APPROVED from 12/17/24 to 12/17/25. Approval letter sent to scan center.  Patient can continue to fill through [facility]: [phone]   Authorization # [id number] Phone # [phone]

## 2024-12-18 ENCOUNTER — Other Ambulatory Visit: Payer: Self-pay

## 2024-12-22 ENCOUNTER — Encounter (HOSPITAL_BASED_OUTPATIENT_CLINIC_OR_DEPARTMENT_OTHER): Payer: Self-pay | Admitting: Pulmonary Disease

## 2024-12-22 MED ORDER — MONTELUKAST SODIUM 10 MG PO TABS: 10.0000 mg | ORAL_TABLET | Freq: Every day | ORAL | 5 refills | Status: AC

## 2024-12-22 NOTE — Telephone Encounter (Signed)
 Please advise.

## 2024-12-24 ENCOUNTER — Other Ambulatory Visit: Payer: Self-pay

## 2024-12-25 NOTE — Telephone Encounter (Signed)
 No grants currently available, pt has been added to waitlist if one opens

## 2024-12-28 ENCOUNTER — Ambulatory Visit

## 2025-02-01 ENCOUNTER — Ambulatory Visit

## 2025-03-24 ENCOUNTER — Ambulatory Visit: Admitting: Allergy

## 2025-07-21 ENCOUNTER — Encounter: Admitting: Radiology
